# Patient Record
Sex: Female | Born: 1940 | Race: Black or African American | Hispanic: No | Smoking: Never smoker
Health system: Southern US, Community
[De-identification: ages and names within clinical notes are randomized; demographics above are authoritative.]

## PROBLEM LIST (undated history)

## (undated) DIAGNOSIS — E119 Type 2 diabetes mellitus without complications: Secondary | ICD-10-CM

## (undated) DIAGNOSIS — I499 Cardiac arrhythmia, unspecified: Secondary | ICD-10-CM

## (undated) DIAGNOSIS — I639 Cerebral infarction, unspecified: Secondary | ICD-10-CM

## (undated) HISTORY — PX: TRACHEOSTOMY: SUR1362

## (undated) HISTORY — PX: PEG TUBE PLACEMENT: SUR1034

## (undated) HISTORY — PX: BELOW KNEE LEG AMPUTATION: SUR23

---

## 2020-03-19 DIAGNOSIS — J189 Pneumonia, unspecified organism: Secondary | ICD-10-CM

## 2020-03-19 DIAGNOSIS — J9621 Acute and chronic respiratory failure with hypoxia: Secondary | ICD-10-CM | POA: Diagnosis not present

## 2020-03-19 DIAGNOSIS — I5022 Chronic systolic (congestive) heart failure: Secondary | ICD-10-CM | POA: Diagnosis not present

## 2020-03-19 DIAGNOSIS — G40509 Epileptic seizures related to external causes, not intractable, without status epilepticus: Secondary | ICD-10-CM | POA: Diagnosis not present

## 2020-03-20 DIAGNOSIS — G40509 Epileptic seizures related to external causes, not intractable, without status epilepticus: Secondary | ICD-10-CM | POA: Diagnosis not present

## 2020-03-20 DIAGNOSIS — J9621 Acute and chronic respiratory failure with hypoxia: Secondary | ICD-10-CM | POA: Diagnosis not present

## 2020-03-20 DIAGNOSIS — I5022 Chronic systolic (congestive) heart failure: Secondary | ICD-10-CM

## 2020-03-20 DIAGNOSIS — J189 Pneumonia, unspecified organism: Secondary | ICD-10-CM | POA: Diagnosis not present

## 2020-03-21 DIAGNOSIS — I5022 Chronic systolic (congestive) heart failure: Secondary | ICD-10-CM | POA: Diagnosis not present

## 2020-03-21 DIAGNOSIS — J9621 Acute and chronic respiratory failure with hypoxia: Secondary | ICD-10-CM | POA: Diagnosis not present

## 2020-03-21 DIAGNOSIS — G40509 Epileptic seizures related to external causes, not intractable, without status epilepticus: Secondary | ICD-10-CM

## 2020-03-21 DIAGNOSIS — J189 Pneumonia, unspecified organism: Secondary | ICD-10-CM | POA: Diagnosis not present

## 2020-03-22 DIAGNOSIS — I5022 Chronic systolic (congestive) heart failure: Secondary | ICD-10-CM

## 2020-03-22 DIAGNOSIS — J189 Pneumonia, unspecified organism: Secondary | ICD-10-CM

## 2020-03-22 DIAGNOSIS — G40509 Epileptic seizures related to external causes, not intractable, without status epilepticus: Secondary | ICD-10-CM

## 2020-03-22 DIAGNOSIS — J9621 Acute and chronic respiratory failure with hypoxia: Secondary | ICD-10-CM | POA: Diagnosis not present

## 2020-03-23 DIAGNOSIS — G40509 Epileptic seizures related to external causes, not intractable, without status epilepticus: Secondary | ICD-10-CM | POA: Diagnosis not present

## 2020-03-23 DIAGNOSIS — J9621 Acute and chronic respiratory failure with hypoxia: Secondary | ICD-10-CM | POA: Diagnosis not present

## 2020-03-23 DIAGNOSIS — J189 Pneumonia, unspecified organism: Secondary | ICD-10-CM | POA: Diagnosis not present

## 2020-03-23 DIAGNOSIS — I5022 Chronic systolic (congestive) heart failure: Secondary | ICD-10-CM

## 2020-03-24 DIAGNOSIS — J9621 Acute and chronic respiratory failure with hypoxia: Secondary | ICD-10-CM | POA: Diagnosis not present

## 2020-03-24 DIAGNOSIS — J189 Pneumonia, unspecified organism: Secondary | ICD-10-CM | POA: Diagnosis not present

## 2020-03-24 DIAGNOSIS — I5022 Chronic systolic (congestive) heart failure: Secondary | ICD-10-CM | POA: Diagnosis not present

## 2020-03-24 DIAGNOSIS — G40509 Epileptic seizures related to external causes, not intractable, without status epilepticus: Secondary | ICD-10-CM

## 2020-03-25 DIAGNOSIS — J189 Pneumonia, unspecified organism: Secondary | ICD-10-CM | POA: Diagnosis not present

## 2020-03-25 DIAGNOSIS — I5022 Chronic systolic (congestive) heart failure: Secondary | ICD-10-CM | POA: Diagnosis not present

## 2020-03-25 DIAGNOSIS — G40509 Epileptic seizures related to external causes, not intractable, without status epilepticus: Secondary | ICD-10-CM | POA: Diagnosis not present

## 2020-03-25 DIAGNOSIS — J9621 Acute and chronic respiratory failure with hypoxia: Secondary | ICD-10-CM | POA: Diagnosis not present

## 2020-03-31 DIAGNOSIS — J9621 Acute and chronic respiratory failure with hypoxia: Secondary | ICD-10-CM | POA: Diagnosis not present

## 2020-03-31 DIAGNOSIS — G40509 Epileptic seizures related to external causes, not intractable, without status epilepticus: Secondary | ICD-10-CM | POA: Diagnosis not present

## 2020-03-31 DIAGNOSIS — J189 Pneumonia, unspecified organism: Secondary | ICD-10-CM

## 2020-03-31 DIAGNOSIS — I5022 Chronic systolic (congestive) heart failure: Secondary | ICD-10-CM

## 2020-04-01 DIAGNOSIS — J9621 Acute and chronic respiratory failure with hypoxia: Secondary | ICD-10-CM

## 2020-04-01 DIAGNOSIS — I5022 Chronic systolic (congestive) heart failure: Secondary | ICD-10-CM

## 2020-04-01 DIAGNOSIS — G40509 Epileptic seizures related to external causes, not intractable, without status epilepticus: Secondary | ICD-10-CM | POA: Diagnosis not present

## 2020-04-01 DIAGNOSIS — J189 Pneumonia, unspecified organism: Secondary | ICD-10-CM

## 2020-04-02 DIAGNOSIS — G40509 Epileptic seizures related to external causes, not intractable, without status epilepticus: Secondary | ICD-10-CM | POA: Diagnosis not present

## 2020-04-02 DIAGNOSIS — I5022 Chronic systolic (congestive) heart failure: Secondary | ICD-10-CM | POA: Diagnosis not present

## 2020-04-02 DIAGNOSIS — J189 Pneumonia, unspecified organism: Secondary | ICD-10-CM | POA: Diagnosis not present

## 2020-04-02 DIAGNOSIS — J9621 Acute and chronic respiratory failure with hypoxia: Secondary | ICD-10-CM | POA: Diagnosis not present

## 2020-04-03 DIAGNOSIS — J189 Pneumonia, unspecified organism: Secondary | ICD-10-CM

## 2020-04-03 DIAGNOSIS — J9621 Acute and chronic respiratory failure with hypoxia: Secondary | ICD-10-CM

## 2020-04-03 DIAGNOSIS — G40509 Epileptic seizures related to external causes, not intractable, without status epilepticus: Secondary | ICD-10-CM

## 2020-04-03 DIAGNOSIS — I5022 Chronic systolic (congestive) heart failure: Secondary | ICD-10-CM

## 2020-04-04 DIAGNOSIS — I5022 Chronic systolic (congestive) heart failure: Secondary | ICD-10-CM

## 2020-04-04 DIAGNOSIS — J9621 Acute and chronic respiratory failure with hypoxia: Secondary | ICD-10-CM

## 2020-04-04 DIAGNOSIS — J189 Pneumonia, unspecified organism: Secondary | ICD-10-CM

## 2020-04-04 DIAGNOSIS — G40509 Epileptic seizures related to external causes, not intractable, without status epilepticus: Secondary | ICD-10-CM

## 2020-04-15 ENCOUNTER — Emergency Department (HOSPITAL_COMMUNITY): Payer: Medicare Other

## 2020-04-15 ENCOUNTER — Other Ambulatory Visit: Payer: Self-pay

## 2020-04-15 ENCOUNTER — Encounter (HOSPITAL_COMMUNITY): Payer: Self-pay | Admitting: Family Medicine

## 2020-04-15 ENCOUNTER — Inpatient Hospital Stay (HOSPITAL_COMMUNITY)
Admission: EM | Admit: 2020-04-15 | Discharge: 2020-05-14 | DRG: 308 | Disposition: A | Discharge status: Other Acute Inpatient Hospital | Payer: Medicare Other | Attending: Internal Medicine | Admitting: Internal Medicine

## 2020-04-15 DIAGNOSIS — Y95 Nosocomial condition: Secondary | ICD-10-CM | POA: Diagnosis present

## 2020-04-15 DIAGNOSIS — N179 Acute kidney failure, unspecified: Secondary | ICD-10-CM

## 2020-04-15 DIAGNOSIS — Z6831 Body mass index (BMI) 31.0-31.9, adult: Secondary | ICD-10-CM

## 2020-04-15 DIAGNOSIS — D6489 Other specified anemias: Secondary | ICD-10-CM | POA: Diagnosis present

## 2020-04-15 DIAGNOSIS — J810 Acute pulmonary edema: Secondary | ICD-10-CM | POA: Diagnosis not present

## 2020-04-15 DIAGNOSIS — Z8701 Personal history of pneumonia (recurrent): Secondary | ICD-10-CM | POA: Diagnosis not present

## 2020-04-15 DIAGNOSIS — Z1621 Resistance to vancomycin: Secondary | ICD-10-CM | POA: Diagnosis present

## 2020-04-15 DIAGNOSIS — K560 Paralytic ileus: Secondary | ICD-10-CM | POA: Diagnosis not present

## 2020-04-15 DIAGNOSIS — I9589 Other hypotension: Secondary | ICD-10-CM

## 2020-04-15 DIAGNOSIS — N186 End stage renal disease: Secondary | ICD-10-CM | POA: Diagnosis present

## 2020-04-15 DIAGNOSIS — N390 Urinary tract infection, site not specified: Secondary | ICD-10-CM | POA: Diagnosis present

## 2020-04-15 DIAGNOSIS — R54 Age-related physical debility: Secondary | ICD-10-CM | POA: Diagnosis present

## 2020-04-15 DIAGNOSIS — R403 Persistent vegetative state: Secondary | ICD-10-CM | POA: Diagnosis present

## 2020-04-15 DIAGNOSIS — L89152 Pressure ulcer of sacral region, stage 2: Secondary | ICD-10-CM | POA: Diagnosis present

## 2020-04-15 DIAGNOSIS — E1165 Type 2 diabetes mellitus with hyperglycemia: Secondary | ICD-10-CM | POA: Diagnosis present

## 2020-04-15 DIAGNOSIS — J690 Pneumonitis due to inhalation of food and vomit: Secondary | ICD-10-CM | POA: Diagnosis not present

## 2020-04-15 DIAGNOSIS — K921 Melena: Secondary | ICD-10-CM | POA: Diagnosis not present

## 2020-04-15 DIAGNOSIS — E1169 Type 2 diabetes mellitus with other specified complication: Secondary | ICD-10-CM | POA: Diagnosis present

## 2020-04-15 DIAGNOSIS — Z931 Gastrostomy status: Secondary | ICD-10-CM

## 2020-04-15 DIAGNOSIS — J9601 Acute respiratory failure with hypoxia: Secondary | ICD-10-CM | POA: Diagnosis not present

## 2020-04-15 DIAGNOSIS — Z7951 Long term (current) use of inhaled steroids: Secondary | ICD-10-CM

## 2020-04-15 DIAGNOSIS — Z8616 Personal history of COVID-19: Secondary | ICD-10-CM

## 2020-04-15 DIAGNOSIS — Z7189 Other specified counseling: Secondary | ICD-10-CM

## 2020-04-15 DIAGNOSIS — I132 Hypertensive heart and chronic kidney disease with heart failure and with stage 5 chronic kidney disease, or end stage renal disease: Secondary | ICD-10-CM | POA: Diagnosis not present

## 2020-04-15 DIAGNOSIS — D62 Acute posthemorrhagic anemia: Secondary | ICD-10-CM | POA: Diagnosis not present

## 2020-04-15 DIAGNOSIS — R0603 Acute respiratory distress: Secondary | ICD-10-CM | POA: Diagnosis not present

## 2020-04-15 DIAGNOSIS — J189 Pneumonia, unspecified organism: Secondary | ICD-10-CM | POA: Diagnosis not present

## 2020-04-15 DIAGNOSIS — Z881 Allergy status to other antibiotic agents status: Secondary | ICD-10-CM

## 2020-04-15 DIAGNOSIS — I4819 Other persistent atrial fibrillation: Principal | ICD-10-CM | POA: Diagnosis present

## 2020-04-15 DIAGNOSIS — K920 Hematemesis: Secondary | ICD-10-CM | POA: Diagnosis not present

## 2020-04-15 DIAGNOSIS — D500 Iron deficiency anemia secondary to blood loss (chronic): Secondary | ICD-10-CM | POA: Diagnosis not present

## 2020-04-15 DIAGNOSIS — B952 Enterococcus as the cause of diseases classified elsewhere: Secondary | ICD-10-CM | POA: Diagnosis present

## 2020-04-15 DIAGNOSIS — I152 Hypertension secondary to endocrine disorders: Secondary | ICD-10-CM

## 2020-04-15 DIAGNOSIS — E874 Mixed disorder of acid-base balance: Secondary | ICD-10-CM | POA: Diagnosis not present

## 2020-04-15 DIAGNOSIS — T17908S Unspecified foreign body in respiratory tract, part unspecified causing other injury, sequela: Secondary | ICD-10-CM

## 2020-04-15 DIAGNOSIS — E861 Hypovolemia: Secondary | ICD-10-CM

## 2020-04-15 DIAGNOSIS — R000 Tachycardia, unspecified: Secondary | ICD-10-CM | POA: Diagnosis present

## 2020-04-15 DIAGNOSIS — Z794 Long term (current) use of insulin: Secondary | ICD-10-CM

## 2020-04-15 DIAGNOSIS — Z79899 Other long term (current) drug therapy: Secondary | ICD-10-CM

## 2020-04-15 DIAGNOSIS — Z20822 Contact with and (suspected) exposure to covid-19: Secondary | ICD-10-CM | POA: Diagnosis not present

## 2020-04-15 DIAGNOSIS — Z978 Presence of other specified devices: Secondary | ICD-10-CM

## 2020-04-15 DIAGNOSIS — Z89511 Acquired absence of right leg below knee: Secondary | ICD-10-CM

## 2020-04-15 DIAGNOSIS — Z452 Encounter for adjustment and management of vascular access device: Secondary | ICD-10-CM

## 2020-04-15 DIAGNOSIS — Z4659 Encounter for fitting and adjustment of other gastrointestinal appliance and device: Secondary | ICD-10-CM

## 2020-04-15 DIAGNOSIS — Z8673 Personal history of transient ischemic attack (TIA), and cerebral infarction without residual deficits: Secondary | ICD-10-CM

## 2020-04-15 DIAGNOSIS — Z515 Encounter for palliative care: Secondary | ICD-10-CM

## 2020-04-15 DIAGNOSIS — N170 Acute kidney failure with tubular necrosis: Secondary | ICD-10-CM | POA: Diagnosis not present

## 2020-04-15 DIAGNOSIS — I959 Hypotension, unspecified: Secondary | ICD-10-CM | POA: Diagnosis present

## 2020-04-15 DIAGNOSIS — I4892 Unspecified atrial flutter: Secondary | ICD-10-CM | POA: Diagnosis not present

## 2020-04-15 DIAGNOSIS — I69351 Hemiplegia and hemiparesis following cerebral infarction affecting right dominant side: Secondary | ICD-10-CM

## 2020-04-15 DIAGNOSIS — E1122 Type 2 diabetes mellitus with diabetic chronic kidney disease: Secondary | ICD-10-CM | POA: Diagnosis present

## 2020-04-15 DIAGNOSIS — Z992 Dependence on renal dialysis: Secondary | ICD-10-CM

## 2020-04-15 DIAGNOSIS — D638 Anemia in other chronic diseases classified elsewhere: Secondary | ICD-10-CM | POA: Diagnosis present

## 2020-04-15 DIAGNOSIS — Z833 Family history of diabetes mellitus: Secondary | ICD-10-CM

## 2020-04-15 DIAGNOSIS — Z930 Tracheostomy status: Secondary | ICD-10-CM | POA: Diagnosis not present

## 2020-04-15 DIAGNOSIS — I5033 Acute on chronic diastolic (congestive) heart failure: Secondary | ICD-10-CM | POA: Diagnosis present

## 2020-04-15 DIAGNOSIS — I69391 Dysphagia following cerebral infarction: Secondary | ICD-10-CM

## 2020-04-15 DIAGNOSIS — E11649 Type 2 diabetes mellitus with hypoglycemia without coma: Secondary | ICD-10-CM | POA: Diagnosis not present

## 2020-04-15 DIAGNOSIS — L8990 Pressure ulcer of unspecified site, unspecified stage: Secondary | ICD-10-CM | POA: Insufficient documentation

## 2020-04-15 DIAGNOSIS — J9690 Respiratory failure, unspecified, unspecified whether with hypoxia or hypercapnia: Secondary | ICD-10-CM

## 2020-04-15 DIAGNOSIS — J989 Respiratory disorder, unspecified: Secondary | ICD-10-CM | POA: Diagnosis not present

## 2020-04-15 DIAGNOSIS — Z9911 Dependence on respirator [ventilator] status: Secondary | ICD-10-CM

## 2020-04-15 DIAGNOSIS — D649 Anemia, unspecified: Secondary | ICD-10-CM | POA: Diagnosis present

## 2020-04-15 DIAGNOSIS — J9621 Acute and chronic respiratory failure with hypoxia: Secondary | ICD-10-CM | POA: Diagnosis present

## 2020-04-15 DIAGNOSIS — J9611 Chronic respiratory failure with hypoxia: Secondary | ICD-10-CM | POA: Diagnosis not present

## 2020-04-15 DIAGNOSIS — E875 Hyperkalemia: Secondary | ICD-10-CM | POA: Diagnosis not present

## 2020-04-15 DIAGNOSIS — N185 Chronic kidney disease, stage 5: Secondary | ICD-10-CM | POA: Diagnosis not present

## 2020-04-15 DIAGNOSIS — Z66 Do not resuscitate: Secondary | ICD-10-CM | POA: Diagnosis not present

## 2020-04-15 DIAGNOSIS — E876 Hypokalemia: Secondary | ICD-10-CM | POA: Diagnosis not present

## 2020-04-15 DIAGNOSIS — K219 Gastro-esophageal reflux disease without esophagitis: Secondary | ICD-10-CM | POA: Diagnosis present

## 2020-04-15 DIAGNOSIS — G40909 Epilepsy, unspecified, not intractable, without status epilepticus: Secondary | ICD-10-CM | POA: Diagnosis not present

## 2020-04-15 DIAGNOSIS — Z2239 Carrier of other specified bacterial diseases: Secondary | ICD-10-CM

## 2020-04-15 DIAGNOSIS — R531 Weakness: Secondary | ICD-10-CM | POA: Diagnosis not present

## 2020-04-15 DIAGNOSIS — E669 Obesity, unspecified: Secondary | ICD-10-CM | POA: Diagnosis present

## 2020-04-15 DIAGNOSIS — K567 Ileus, unspecified: Secondary | ICD-10-CM

## 2020-04-15 DIAGNOSIS — K922 Gastrointestinal hemorrhage, unspecified: Secondary | ICD-10-CM | POA: Diagnosis not present

## 2020-04-15 DIAGNOSIS — Z7982 Long term (current) use of aspirin: Secondary | ICD-10-CM

## 2020-04-15 DIAGNOSIS — R0600 Dyspnea, unspecified: Secondary | ICD-10-CM

## 2020-04-15 DIAGNOSIS — I4891 Unspecified atrial fibrillation: Secondary | ICD-10-CM

## 2020-04-15 DIAGNOSIS — E46 Unspecified protein-calorie malnutrition: Secondary | ICD-10-CM | POA: Diagnosis present

## 2020-04-15 DIAGNOSIS — E1159 Type 2 diabetes mellitus with other circulatory complications: Secondary | ICD-10-CM

## 2020-04-15 DIAGNOSIS — I480 Paroxysmal atrial fibrillation: Secondary | ICD-10-CM | POA: Diagnosis not present

## 2020-04-15 DIAGNOSIS — T17908A Unspecified foreign body in respiratory tract, part unspecified causing other injury, initial encounter: Secondary | ICD-10-CM | POA: Diagnosis present

## 2020-04-15 DIAGNOSIS — Z8249 Family history of ischemic heart disease and other diseases of the circulatory system: Secondary | ICD-10-CM

## 2020-04-15 DIAGNOSIS — J9612 Chronic respiratory failure with hypercapnia: Secondary | ICD-10-CM | POA: Diagnosis not present

## 2020-04-15 DIAGNOSIS — J9610 Chronic respiratory failure, unspecified whether with hypoxia or hypercapnia: Secondary | ICD-10-CM

## 2020-04-15 HISTORY — DX: Personal history of transient ischemic attack (TIA), and cerebral infarction without residual deficits: Z86.73

## 2020-04-15 HISTORY — DX: Cardiac arrhythmia, unspecified: I49.9

## 2020-04-15 HISTORY — DX: Type 2 diabetes mellitus without complications: E11.9

## 2020-04-15 HISTORY — DX: Cerebral infarction, unspecified: I63.9

## 2020-04-15 HISTORY — DX: Epilepsy, unspecified, not intractable, without status epilepticus: G40.909

## 2020-04-15 LAB — COMPREHENSIVE METABOLIC PANEL
ALT: 25 U/L (ref 0–44)
AST: 58 U/L — ABNORMAL HIGH (ref 15–41)
Albumin: 2.3 g/dL — ABNORMAL LOW (ref 3.5–5.0)
Alkaline Phosphatase: 75 U/L (ref 38–126)
Anion gap: 11 (ref 5–15)
BUN: 23 mg/dL (ref 8–23)
CO2: 27 mmol/L (ref 22–32)
Calcium: 8.8 mg/dL — ABNORMAL LOW (ref 8.9–10.3)
Chloride: 97 mmol/L — ABNORMAL LOW (ref 98–111)
Creatinine, Ser: 0.96 mg/dL (ref 0.44–1.00)
GFR, Estimated: >60 mL/min (ref >60)
Glucose, Bld: 141 mg/dL — ABNORMAL HIGH (ref 70–99)
Potassium: 4.5 mmol/L (ref 3.5–5.1)
Sodium: 135 mmol/L (ref 135–145)
Total Bilirubin: 1.1 mg/dL (ref 0.3–1.2)
Total Protein: 8 g/dL (ref 6.5–8.1)

## 2020-04-15 LAB — CBC WITH DIFFERENTIAL/PLATELET
Abs Immature Granulocytes: 0.02 10*3/uL (ref 0.00–0.07)
Basophils Absolute: 0 10*3/uL (ref 0.0–0.1)
Basophils Relative: 0 %
Eosinophils Absolute: 0.5 10*3/uL (ref 0.0–0.5)
Eosinophils Relative: 7 %
HCT: 27.2 % — ABNORMAL LOW (ref 36.0–46.0)
Hemoglobin: 8.1 g/dL — ABNORMAL LOW (ref 12.0–15.0)
Immature Granulocytes: 0 %
Lymphocytes Relative: 16 %
Lymphs Abs: 1.1 10*3/uL (ref 0.7–4.0)
MCH: 28.4 pg (ref 26.0–34.0)
MCHC: 29.8 g/dL — ABNORMAL LOW (ref 30.0–36.0)
MCV: 95.4 fL (ref 80.0–100.0)
Monocytes Absolute: 0.4 10*3/uL (ref 0.1–1.0)
Monocytes Relative: 6 %
Neutro Abs: 4.9 10*3/uL (ref 1.7–7.7)
Neutrophils Relative %: 71 %
Platelets: 225 10*3/uL (ref 150–400)
RBC: 2.85 MIL/uL — ABNORMAL LOW (ref 3.87–5.11)
RDW: 15.9 % — ABNORMAL HIGH (ref 11.5–15.5)
WBC: 6.9 10*3/uL (ref 4.0–10.5)
nRBC: 0 % (ref 0.0–0.2)

## 2020-04-15 LAB — URINALYSIS, ROUTINE W REFLEX MICROSCOPIC
Bilirubin Urine: NEGATIVE
Glucose, UA: NEGATIVE mg/dL
Ketones, ur: NEGATIVE mg/dL
Nitrite: NEGATIVE
Protein, ur: NEGATIVE mg/dL
Specific Gravity, Urine: 1.01 (ref 1.005–1.030)
pH: 5 (ref 5.0–8.0)

## 2020-04-15 LAB — APTT: aPTT: 34 seconds (ref 24–36)

## 2020-04-15 LAB — RESP PANEL BY RT-PCR (FLU A&B, COVID) ARPGX2
Influenza A by PCR: NEGATIVE
Influenza B by PCR: NEGATIVE
SARS Coronavirus 2 by RT PCR: NEGATIVE

## 2020-04-15 LAB — PROTIME-INR
INR: 1.2 (ref 0.8–1.2)
Prothrombin Time: 14.5 seconds (ref 11.4–15.2)

## 2020-04-15 LAB — LACTIC ACID, PLASMA: Lactic Acid, Venous: 0.9 mmol/L (ref 0.5–1.9)

## 2020-04-15 MED ORDER — DILTIAZEM HCL 30 MG PO TABS
30.0000 mg | ORAL_TABLET | Freq: Once | ORAL | Status: AC
  Administered 2020-04-15: 30 mg via ORAL
  Filled 2020-04-15: qty 1

## 2020-04-15 MED ORDER — PANTOPRAZOLE SODIUM 40 MG IV SOLR
40.0000 mg | Freq: Every day | INTRAVENOUS | Status: DC
  Administered 2020-04-16 – 2020-04-20 (×5): 40 mg via INTRAVENOUS
  Filled 2020-04-15 (×5): qty 40

## 2020-04-15 MED ORDER — DILTIAZEM LOAD VIA INFUSION
15.0000 mg | Freq: Once | INTRAVENOUS | Status: AC
  Administered 2020-04-15: 15 mg via INTRAVENOUS
  Filled 2020-04-15: qty 15

## 2020-04-15 MED ORDER — DILTIAZEM HCL-DEXTROSE 125-5 MG/125ML-% IV SOLN (PREMIX)
5.0000 mg/h | INTRAVENOUS | Status: DC
  Administered 2020-04-15: 5 mg/h via INTRAVENOUS
  Administered 2020-04-16 (×2): 15 mg/h via INTRAVENOUS
  Filled 2020-04-15 (×3): qty 125

## 2020-04-15 MED ORDER — BUDESONIDE 0.25 MG/2ML IN SUSP
0.2500 mg | Freq: Two times a day (BID) | RESPIRATORY_TRACT | Status: DC
  Administered 2020-04-16 – 2020-04-20 (×7): 0.25 mg via RESPIRATORY_TRACT
  Filled 2020-04-15 (×7): qty 2

## 2020-04-15 MED ORDER — PIPERACILLIN-TAZOBACTAM 3.375 G IVPB
3.3750 g | Freq: Once | INTRAVENOUS | Status: AC
  Administered 2020-04-15: 3.375 g via INTRAVENOUS
  Filled 2020-04-15: qty 50

## 2020-04-15 MED ORDER — SODIUM CHLORIDE 0.9 % IV SOLN
500.0000 mg | INTRAVENOUS | Status: AC
  Administered 2020-04-16 – 2020-04-20 (×5): 500 mg via INTRAVENOUS
  Filled 2020-04-15 (×5): qty 500

## 2020-04-15 MED ORDER — DILTIAZEM HCL 25 MG/5ML IV SOLN
15.0000 mg | Freq: Once | INTRAVENOUS | Status: AC
  Administered 2020-04-15: 15 mg via INTRAVENOUS

## 2020-04-15 MED ORDER — MORPHINE SULFATE (PF) 2 MG/ML IV SOLN
1.0000 mg | INTRAVENOUS | Status: DC | PRN
  Administered 2020-04-20: 1 mg via INTRAVENOUS
  Administered 2020-04-20: 2 mg via INTRAVENOUS
  Filled 2020-04-15 (×2): qty 1

## 2020-04-15 MED ORDER — INSULIN ASPART 100 UNIT/ML ~~LOC~~ SOLN
0.0000 [IU] | SUBCUTANEOUS | Status: DC
  Administered 2020-04-17 (×2): 1 [IU] via SUBCUTANEOUS
  Administered 2020-04-18: 2 [IU] via SUBCUTANEOUS
  Administered 2020-04-18 (×2): 5 [IU] via SUBCUTANEOUS
  Administered 2020-04-18: 3 [IU] via SUBCUTANEOUS
  Administered 2020-04-18: 4 [IU] via SUBCUTANEOUS
  Administered 2020-04-18: 2 [IU] via SUBCUTANEOUS
  Administered 2020-04-19: 4 [IU] via SUBCUTANEOUS
  Administered 2020-04-19: 6 [IU] via SUBCUTANEOUS
  Administered 2020-04-19 (×2): 5 [IU] via SUBCUTANEOUS
  Administered 2020-04-19: 4 [IU] via SUBCUTANEOUS
  Administered 2020-04-20: 5 [IU] via SUBCUTANEOUS
  Administered 2020-04-20: 6 [IU] via SUBCUTANEOUS
  Administered 2020-04-20: 5 [IU] via SUBCUTANEOUS

## 2020-04-15 MED ORDER — ACETAMINOPHEN 325 MG PO TABS
650.0000 mg | ORAL_TABLET | ORAL | Status: DC | PRN
  Administered 2020-04-21 – 2020-04-23 (×3): 650 mg
  Filled 2020-04-15 (×3): qty 2

## 2020-04-15 MED ORDER — ONDANSETRON HCL 4 MG/2ML IJ SOLN: 4.0000 mg | Freq: Four times a day (QID) | INTRAMUSCULAR | Status: DC | PRN

## 2020-04-15 MED ORDER — IPRATROPIUM-ALBUTEROL 0.5-2.5 (3) MG/3ML IN SOLN
3.0000 mL | Freq: Four times a day (QID) | RESPIRATORY_TRACT | Status: DC
  Administered 2020-04-16 (×4): 3 mL via RESPIRATORY_TRACT
  Filled 2020-04-15 (×4): qty 3

## 2020-04-15 MED ORDER — VANCOMYCIN HCL 1500 MG/300ML IV SOLN
1500.0000 mg | Freq: Once | INTRAVENOUS | Status: AC
  Administered 2020-04-15: 1500 mg via INTRAVENOUS
  Filled 2020-04-15: qty 300

## 2020-04-15 MED ORDER — IPRATROPIUM-ALBUTEROL 0.5-2.5 (3) MG/3ML IN SOLN: 3.0000 mL | Freq: Four times a day (QID) | RESPIRATORY_TRACT | Status: DC | PRN

## 2020-04-15 MED ORDER — LEVETIRACETAM 100 MG/ML PO SOLN
750.0000 mg | Freq: Two times a day (BID) | ORAL | Status: DC
  Administered 2020-04-16 – 2020-04-22 (×14): 750 mg
  Filled 2020-04-15 (×15): qty 10

## 2020-04-15 MED ORDER — SODIUM CHLORIDE 0.9 % IV BOLUS
1000.0000 mL | Freq: Once | INTRAVENOUS | Status: AC
  Administered 2020-04-15: 1000 mL via INTRAVENOUS

## 2020-04-15 MED ORDER — IOHEXOL 350 MG/ML SOLN
75.0000 mL | Freq: Once | INTRAVENOUS | Status: AC | PRN
  Administered 2020-04-15: 75 mL via INTRAVENOUS

## 2020-04-15 MED ORDER — SODIUM CHLORIDE 3 % IN NEBU
4.0000 mL | INHALATION_SOLUTION | RESPIRATORY_TRACT | Status: AC | PRN
  Administered 2020-04-18: 4 mL via RESPIRATORY_TRACT
  Filled 2020-04-15 (×2): qty 4

## 2020-04-15 MED ORDER — METOPROLOL TARTRATE 5 MG/5ML IV SOLN
5.0000 mg | Freq: Once | INTRAVENOUS | Status: AC
  Administered 2020-04-15: 5 mg via INTRAVENOUS
  Filled 2020-04-15: qty 5

## 2020-04-15 MED ORDER — FUROSEMIDE 20 MG PO TABS
20.0000 mg | ORAL_TABLET | Freq: Every day | ORAL | Status: DC
  Administered 2020-04-16 – 2020-04-17 (×2): 20 mg
  Filled 2020-04-15 (×3): qty 1

## 2020-04-15 MED ORDER — APIXABAN 5 MG PO TABS
5.0000 mg | ORAL_TABLET | Freq: Two times a day (BID) | ORAL | Status: DC
  Administered 2020-04-16 (×2): 5 mg via ORAL
  Filled 2020-04-15 (×3): qty 1

## 2020-04-15 MED ORDER — VANCOMYCIN HCL 1250 MG/250ML IV SOLN
1250.0000 mg | INTRAVENOUS | Status: DC
  Administered 2020-04-16: 1250 mg via INTRAVENOUS
  Filled 2020-04-15 (×2): qty 250

## 2020-04-15 MED ORDER — SODIUM CHLORIDE 0.9 % IV SOLN
2.0000 g | Freq: Two times a day (BID) | INTRAVENOUS | Status: DC
  Administered 2020-04-16 – 2020-04-18 (×5): 2 g via INTRAVENOUS
  Filled 2020-04-15 (×5): qty 2

## 2020-04-15 MED ORDER — MORPHINE SULFATE (PF) 2 MG/ML IV SOLN: 1.0000 mg | INTRAVENOUS | Status: DC | PRN

## 2020-04-15 NOTE — H&P (Signed)
History and Physical    Zillah Duttry [id number] DOB: 16-Aug-1940 DOA: 04/15/2020  PCP: Aaron Edelman, MD   Patient coming from: [facility]   Chief Complaint: Tachycardia   HPI: Kalana Stegenga is a 80 y.o. 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  Patient required 20% FiO2 at time of admission to LTAC 1 month ago, was started on antibiotics approximately 1 week ago for pneumonia and is currently on Zosyn.  She reportedly became tachycardic and has been sustaining in the 120s to 150 range for the past 2 days despite IV Lopressor.  Son was unable to provide any further history.  ED Course: Upon arrival to the ED, patient is found to be afebrile, saturating mid 90s on 8 L/min of supplemental oxygen, tachypneic in the mid 30s, tachycardic to the 130s, and with blood pressure 86/45.  EKG features atrial fibrillation with rate 140.  CTA chest is negative for PE but notable for pulmonary edema and small bilateral pleural effusions as well as loculated effusion within the right minor fissure, oblique fissure on the left, and also noted to have patchy bibasilar airspace opacities.  CT of the abdomen is negative for acute findings and demonstrates percutaneous gastrostomy tube with appropriate positioning.  Chemistry panel with glucose 141, albumin 2.3, and AST 58.  CBC with normocytic anemia, hemoglobin 8.1.  Lactic acid reassuringly normal.  Blood and urine cultures were collected in the emergency department and the patient was treated with IV diltiazem injection, started on IV  diltiazem infusion, also given 5 mg IV Lopressor, vancomycin, Zosyn, and 2 L of saline.  Cardiology and PCCM were consulted by the ED physician.  Review of Systems:  Unable to complete ROS secondary the patient's clinical condition.  Past Medical History:  Diagnosis Date  . Diabetes mellitus without complication ([place])   . Dysrhythmia   . History of CVA (cerebrovascular accident) 04/15/2020  . Seizure disorder ([place]) 04/15/2020  . Stroke [facility])     Past Surgical History:  Procedure Laterality Date  . BELOW KNEE LEG AMPUTATION    . PEG TUBE PLACEMENT    . TRACHEOSTOMY      Social History:   reports that she has never smoked. She has never used smokeless tobacco. She reports previous alcohol use. No history on file for drug use.  Not on File  Family History  Problem Relation Age of Onset  . Diabetes type II Maternal Grandmother   . Hypertension Maternal Grandmother   . Macular degeneration Maternal Grandmother   . Diabetes type II Maternal Grandfather   . Hypertension Maternal Grandfather      Prior to Admission medications   Not on File    Physical Exam: Vitals:   04/15/20 2240 04/15/20 2250 04/15/20 2300 04/15/20 2334  BP: 134/78 (!) 128/97 136/83 (!) 152/76  Pulse: (!) 133 (!) 135 (!) 133 (!) 138  Resp: (!) 27 18 (!) 34 (!) 32  Temp:      TempSrc:      SpO2: 94% 97% 93% 94%  Weight:      Height:        Constitutional: NAD, calm  Eyes: PERTLA, lids and conjunctivae normal ENMT: Mucous membranes are moist. Posterior  pharynx clear of any exudate or lesions.   Neck: tracheostomy, no masses  Respiratory: Coarse upper airway sounds, scattered rhonchi, tachypnea, labored respirations. No pallor or cyanosis.  Cardiovascular: Rate ~120 and irregularly irregular. No extremity edema.   Abdomen: No distension, no tenderness, soft. Bowel sounds active.  Musculoskeletal: no clubbing / cyanosis. Status-post right leg amputation. Contractures involving RUE.  Skin: no  significant rashes, lesions, ulcers. Warm, well-perfused. Neurologic: Gaze deviation. Right hemiparesis.   Psychiatric: Alert, makes eye contact, nods/shakes head to questions. Calm and cooperative.    Labs and Imaging on Admission: I have personally reviewed following labs and imaging studies  CBC: Recent Labs  Lab 04/15/20 1253  WBC 6.9  NEUTROABS 4.9  HGB 8.1*  HCT 27.2*  MCV 95.4  PLT 123456   Basic Metabolic Panel: Recent Labs  Lab 04/15/20 1253  NA 135  K 4.5  CL 97*  CO2 27  GLUCOSE 141*  BUN 23  CREATININE 0.96  CALCIUM 8.8*   GFR: Estimated Creatinine Clearance: 52.5 mL/min (by C-G formula based on SCr of 0.96 mg/dL). Liver Function Tests: Recent Labs  Lab 04/15/20 1253  AST 58*  ALT 25  ALKPHOS 75  BILITOT 1.1  PROT 8.0  ALBUMIN 2.3*   No results for input(s): LIPASE, AMYLASE in the last 168 hours. No results for input(s): AMMONIA in the last 168 hours. Coagulation Profile: Recent Labs  Lab 04/15/20 1253  INR 1.2   Cardiac Enzymes: No results for input(s): CKTOTAL, CKMB, CKMBINDEX, TROPONINI in the last 168 hours. BNP (last 3 results) No results for input(s): PROBNP in the last 8760 hours. HbA1C: No results for input(s): HGBA1C in the last 72 hours. CBG: No results for input(s): GLUCAP in the last 168 hours. Lipid Profile: No results for input(s): CHOL, HDL, LDLCALC, TRIG, CHOLHDL, LDLDIRECT in the last 72 hours. Thyroid Function Tests: No results for input(s): TSH, T4TOTAL, FREET4, T3FREE, THYROIDAB in the last 72 hours. Anemia Panel: No results for input(s): VITAMINB12, FOLATE, FERRITIN, TIBC, IRON, RETICCTPCT in the last 72 hours. Urine analysis:    Component Value Date/Time   COLORURINE YELLOW 04/15/2020 1452   APPEARANCEUR HAZY (A) 04/15/2020 1452   LABSPEC 1.010 04/15/2020 1452   PHURINE 5.0 04/15/2020 1452   GLUCOSEU NEGATIVE 04/15/2020 1452   HGBUR MODERATE (A) 04/15/2020 1452   BILIRUBINUR NEGATIVE 04/15/2020 1452   KETONESUR  NEGATIVE 04/15/2020 1452   PROTEINUR NEGATIVE 04/15/2020 1452   NITRITE NEGATIVE 04/15/2020 1452   LEUKOCYTESUR LARGE (A) 04/15/2020 1452   Sepsis Labs: '@LABRCNTIP'$ (procalcitonin:4,lacticidven:4) ) Recent Results (from the past 240 hour(s))  Resp Panel by RT-PCR (Flu A&B, Covid) Nasopharyngeal Swab     Status: None   Collection Time: 04/15/20 12:55 PM   Specimen: Nasopharyngeal Swab; Nasopharyngeal(NP) swabs in vial transport medium  Result Value Ref Range Status   SARS Coronavirus 2 by RT PCR NEGATIVE NEGATIVE Final    Comment: (NOTE) SARS-CoV-2 target nucleic acids are NOT DETECTED.  The SARS-CoV-2 RNA is generally detectable in upper respiratory specimens during the acute phase of infection. The lowest concentration of SARS-CoV-2 viral copies this assay can detect is 138 copies/mL. A negative result does not preclude SARS-Cov-2 infection and should not be used as the sole basis for treatment or other patient management decisions. A negative result may occur with  improper specimen collection/handling, submission of specimen other than nasopharyngeal swab, presence of viral mutation(s) within the areas targeted by this assay, and inadequate number of viral copies(<138 copies/mL). A negative result must be  combined with clinical observations, patient history, and epidemiological information. The expected result is Negative.  Fact Sheet for Patients:  EntrepreneurPulse.com.au  Fact Sheet for Healthcare Providers:  IncredibleEmployment.be  This test is no t yet approved or cleared by the [place] FDA and  has been authorized for detection and/or diagnosis of SARS-CoV-2 by FDA under an Emergency Use Authorization (EUA). This EUA will remain  in effect (meaning this test can be used) for the duration of the COVID-19 declaration under Section 564(b)(1) of the Act, 21 U.S.C.section 360bbb-3(b)(1), unless the authorization is terminated  or  revoked sooner.       Influenza A by PCR NEGATIVE NEGATIVE Final   Influenza B by PCR NEGATIVE NEGATIVE Final    Comment: (NOTE) The Xpert Xpress SARS-CoV-2/FLU/RSV plus assay is intended as an aid in the diagnosis of influenza from Nasopharyngeal swab specimens and should not be used as a sole basis for treatment. Nasal washings and aspirates are unacceptable for Xpert Xpress SARS-CoV-2/FLU/RSV testing.  Fact Sheet for Patients: EntrepreneurPulse.com.au  Fact Sheet for Healthcare Providers: IncredibleEmployment.be  This test is not yet approved or cleared by the [place] FDA and has been authorized for detection and/or diagnosis of SARS-CoV-2 by FDA under an Emergency Use Authorization (EUA). This EUA will remain in effect (meaning this test can be used) for the duration of the COVID-19 declaration under Section 564(b)(1) of the Act, 21 U.S.C. section 360bbb-3(b)(1), unless the authorization is terminated or revoked.  Performed at [facility], [place] [address], [place], [place] [address]      Radiological Exams on Admission: CT ABDOMEN WO CONTRAST  Result Date: 04/15/2020 CLINICAL DATA:  Possible G-tube malpositioning EXAM: CT ABDOMEN WITHOUT CONTRAST TECHNIQUE: Multidetector CT imaging of the abdomen was performed following the standard protocol without IV contrast. COMPARISON:  CT chest same day FINDINGS: Lower chest: Cardiomegaly. Coronary artery calcifications. Small bilateral pleural effusions with patchy bibasilar opacities. Hepatobiliary: Unremarkable unenhanced appearance of the liver. No focal liver lesion identified. Gallbladder is decompressed. No hyperdense gallstone. No biliary dilatation. Pancreas: Unremarkable. No pancreatic ductal dilatation or surrounding inflammatory changes. Spleen: Grossly unremarkable. Adrenals/Urinary Tract: No definite adrenal nodule. Kidneys appear within normal limits. Excreted contrast within  the bilateral renal collecting systems. No hydronephrosis. Stomach/Bowel: Percutaneous gastrostomy tube is appropriately positioned with balloon located in the anterior aspect of the distal gastric body. No evidence of outlet obstruction. Visualized bowel loops within the upper abdomen are within normal limits. No evidence of obstruction or inflammation. Vascular/Lymphatic: Extensive atherosclerosis throughout the aortoiliac axis. No upper abdominal lymphadenopathy. Other: No ascites or pneumoperitoneum. Musculoskeletal: No acute findings. IMPRESSION: 1. Percutaneous gastrostomy tube is appropriately positioned with balloon located intraluminally in the anterior aspect of the distal gastric body. No evidence of outlet obstruction. 2. No acute findings within the abdomen. 3. Small bilateral pleural effusions with patchy bibasilar opacities. Aortic Atherosclerosis (ICD10-I70.0). Electronically Signed   By: Davina Poke D.O.   On: 04/15/2020 16:16   CT Angio Chest PE W and/or Wo Contrast  Result Date: 04/15/2020 CLINICAL DATA:  Tachycardia EXAM: CT ANGIOGRAPHY CHEST WITH CONTRAST TECHNIQUE: Multidetector CT imaging of the chest was performed using the standard protocol during bolus administration of intravenous contrast. Multiplanar CT image reconstructions and MIPs were obtained to evaluate the vascular anatomy. CONTRAST:  6m OMNIPAQUE IOHEXOL 350 MG/ML SOLN COMPARISON:  04/15/2020 FINDINGS: Cardiovascular: Satisfactory opacification of the pulmonary arteries to the segmental level. No evidence of pulmonary embolism. Thoracic aorta is nonaneurysmal. Common origin of the brachiocephalic  and left common carotid arteries. Tortuosity of the descending thoracic aorta. Atherosclerotic calcifications of the aorta and coronary arteries. Heart size is mildly enlarged. No pericardial effusion. Mediastinum/Nodes: Mildly prominent mediastinal lymph nodes including 12 mm precarinal node (series 5, image 37). No enlarged  axillary or hilar lymph nodes identified. Tracheostomy tube is present. Trachea and esophagus within normal limits. No significant findings within the thyroid gland. Lungs/Pleura: Small bilateral pleural effusions. Loculated pleural fluid accumulation within the minor fissure on the right and within the oblique fissure on the left. Patchy bibasilar airspace opacities. Diffuse interlobular septal thickening with ground-glass attenuation throughout both lung fields. No pneumothorax. Upper Abdomen: Although only partially visualized at the edge of the field of view. Patient's percutaneous gastrostomy tube appears malpositioned with balloon appearing extraluminal positioned anterior to the left hepatic lobe (series 5, image 97). Musculoskeletal: No acute osseous abnormality.  No chest wall mass. Review of the MIP images confirms the above findings. IMPRESSION: 1. Negative for acute pulmonary embolism. 2. Findings suggestive of pulmonary edema with small bilateral pleural effusions. Loculated pleural fluid accumulation within the minor fissure on the right and within the oblique fissure on the left. 3. Patchy bibasilar airspace opacities may represent edema, atelectasis, or pneumonia. 4. Patient's percutaneous gastrostomy tube appears malpositioned although is incompletely visualized at the edge of the field of view. CT of the abdomen could be obtained to further evaluate. 5. Mildly prominent mediastinal lymph nodes, likely reactive. 6. Aortic and coronary artery atherosclerosis (ICD10-I70.0). Electronically Signed   By: Davina Poke D.O.   On: 04/15/2020 15:00   DG Chest Port 1 View  Result Date: 04/15/2020 CLINICAL DATA:  Tachycardia and wheezing EXAM: PORTABLE CHEST 1 VIEW COMPARISON:  November 06, 2010. FINDINGS: Tracheostomy catheter tip is 4.3 cm above the carina. No pneumothorax. There is apparent interstitial pulmonary edema with partially loculated small left pleural effusion. Ill-defined opacity noted in  left base. There is cardiomegaly with pulmonary venous hypertension. No adenopathy. There is aortic atherosclerosis. No bone lesions. IMPRESSION: Tracheostomy as described without evident pneumothorax. Cardiomegaly with a degree of pulmonary vascular congestion. Evidence of interstitial pulmonary edema with small loculated pleural effusion. The appearance is suspicious for a degree of underlying congestive heart failure. Focal airspace opacity in the left base is concerning for focus of pneumonia, likely superimposed on a degree of interstitial pulmonary edema. Aortic Atherosclerosis (ICD10-I70.0). Electronically Signed   By: Lowella Grip III M.D.   On: 04/15/2020 13:55    EKG: Independently reviewed. Atrial fibrillation with RVR, rate 140.   Assessment/Plan   1. Atrial fibrillation with RVR  - Patient with hx of PAF no longer anticoagulated for unclear reason presents with persistent tachycardia despite IV Lopressor at [facility] and is found to be in atrial fibrialation with RVR - CHADS-VASc 7 (age x2, CVA x2, DM, HTN, gender)  - Rate remained rapid after IV diltiazem, IV Lopressor, and 2 liters of NS and she was started on diltiazem infusion  - Appreciate cardiology consultant, recommending starting Eliquis 5 mg BID, continuing diltiazem infusion and transitioning to oral as able, and obtaining TTE once rate is controlled    2. Acute on chronic hypoxic respiratory failure; ?PNA; acute on chronic HFpEF  - History of aspiration, trach dependence, requiring 28% FiO2 on admission to LTAC, now requiring 35%  - CTA negative for PE but concerning for pulm edema, loculated effusion, and possible infection in lower lungs  - Appreciate PCCM consultants  - Continue trach care, nebs, and supplemental O2  -  Check sputum culture, strep pneumo and legionella antigens, trend procalcitonin, check MRSA pcr, continue vancomycin for now and start cefepime and azithromycin  - She required 2 liters NS in ED for  hypotension, may benefit from diuresis if BP remains stable    3. History of CVA   - Residual right hemiparesis and dysphagia, tracheostomy and PEG dependent  -  Starting Eliquis in setting of a fib, hold ASA, consult dietary   4. Seizure disorder  - Continue Keppra    5. Hypertension  - Required 2 liters NS in ED for hypotension, hold hydralazine and Norvasc for now   6. Insulin-dependent DM  - Check CBGs and continue insulin   7. Anemia  - Hgb is 8.1 on admission, was 8.4 on 04/10/20 - Monitor closely while initiating Eliquis     DVT prophylaxis: Eliquis  Code Status: Full, confirmed with family  Level of Care: Level of care: Progressive Family Communication: Son updated in ED  Disposition Plan:  Patient is from: LTAC  Anticipated d/c is to: [facility] Anticipated d/c date is: 04/19/20 Patient currently: In rapid a fib, labored breathing  Consults called: Cardiology, PCCM  Admission status: Inpatient     Vianne Bulls, MD [facility]  04/15/2020, 11:51 PM

## 2020-04-15 NOTE — Consult Note (Signed)
Cardiology Consultation:   Patient ID: Rebecca Harrell MRN: [id number]; DOB: 05/01/1940  Admit date: 04/15/2020 Date of Consult: 04/15/2020  Primary Care Provider: Aaron Edelman, MD [facility] Cardiologist: No primary care provider on file.  [facility] Electrophysiologist:  None   Patient Profile:   28F with pAF, T2DM, HTN, seizures, HFpEF, CVA with residual R sided deficits, GERD and trach who presented from [place] 2/2 ongoing AF/RVR since Friday.   History of Present Illness:   Per care team she has had HR 125-150 over the past 48h and was given metoprolol 5 mg IV several times with no improvement in HR. Last dose prior to evaluation here in the ED was 0830 yesterday. She has been afebrile and is being tx for PNA with zosyn through PICC line (PNA dx on 04/08/20). On admission in AF/RVR (HR 140s) but normotensive. Although not documented she apparently has hx of prior AF. She is not on [facility] however has had previous CVA and has C2V (7). History is very limited as we have no documentation of prior hospitalizations.   Discussed history with daughter. She said she was in [place] for at least 10-15 years following stroke. She was very functional before stroke. Last month she was transitioned to [facility] following trach placement for recurrent aspiration PNA. She was at [place] prior to discharge to [place], she had been there for around 1 week. Daughter says said she was on warfarin in the past but was taken off of it for unknown reason. She said at some point her feeding tube "had blood in it" and they took her off of warfarin maybe at that time. Discussed the risk/benefit of blood thinners and stroke with daughter who would be fine re trialing [place] with her. Confirmed she is FULL code with her daughter.   Home Medications:  Prior to Admission medications   Not on File   Inpatient Medications: Scheduled Meds:  Continuous Infusions: . diltiazem (CARDIZEM)  infusion 5 mg/hr (04/15/20 1629)  . piperacillin-tazobactam (ZOSYN)  IV    . [START ON 04/16/2020] vancomycin     PRN Meds:  Allergies:   Not on File  Social History:   Social History   Socioeconomic History  . Marital status: Divorced    Spouse name: Not on file  . Number of children: Not on file  . Years of education: Not on file  . Highest education level: Not on file  Occupational History  . Not on file  Tobacco Use  . Smoking status: Not on file  . Smokeless tobacco: Not on file  Substance and Sexual Activity  . Alcohol use: Not on file  . Drug use: Not on file  . Sexual activity: Not on file  Other Topics Concern  . Not on file  Social History Narrative  . Not on file   Social Determinants of Health   Financial Resource Strain: Not on file  Food Insecurity: Not on file  Transportation Needs: Not on file  Physical Activity: Not on file  Stress: Not on file  Social Connections: Not on file  Intimate Partner Violence: Not on file    Family History:   *No family history on file.   ROS:  Review of Systems: [y] = yes, '[ ]'$  = no       General: Weight gain '[ ]'$ ; Weight loss '[ ]'$ ; Anorexia '[ ]'$ ; Fatigue '[ ]'$ ; Fever '[ ]'$ ; Chills '[ ]'$ ; Weakness '[ ]'$     Cardiac: Chest pain/pressure '[ ]'$ ;  Resting SOB '[ ]'$ ; Exertional SOB '[ ]'$ ; Orthopnea '[ ]'$ ; Pedal Edema '[ ]'$ ; Palpitations [y]; Syncope '[ ]'$ ; Presyncope '[ ]'$ ; Paroxysmal nocturnal dyspnea '[ ]'$     Pulmonary: Cough '[ ]'$ ; Wheezing '[ ]'$ ; Hemoptysis '[ ]'$ ; Sputum '[ ]'$ ; Snoring '[ ]'$     GI: Vomiting '[ ]'$ ; Dysphagia '[ ]'$ ; Melena '[ ]'$ ; Hematochezia '[ ]'$ ; Heartburn '[ ]'$ ; Abdominal pain '[ ]'$ ; Constipation '[ ]'$ ; Diarrhea '[ ]'$ ; BRBPR '[ ]'$     GU: Hematuria '[ ]'$ ; Dysuria '[ ]'$ ; Nocturia '[ ]'$   Vascular: Pain in legs with walking '[ ]'$ ; Pain in feet with lying flat '[ ]'$ ; Non-healing sores '[ ]'$ ; Stroke '[ ]'$ ; TIA '[ ]'$ ; Slurred speech '[ ]'$ ;    Neuro: Headaches '[ ]'$ ; Vertigo '[ ]'$ ; Seizures '[ ]'$ ; Paresthesias '[ ]'$ ;Blurred vision '[ ]'$ ; Diplopia '[ ]'$ ; Vision changes '[ ]'$     Ortho/Skin:  Arthritis '[ ]'$ ; Joint pain '[ ]'$ ; Muscle pain '[ ]'$ ; Joint swelling '[ ]'$ ; Back Pain '[ ]'$ ; Rash '[ ]'$     Psych: Depression '[ ]'$ ; Anxiety '[ ]'$     Heme: Bleeding problems '[ ]'$ ; Clotting disorders '[ ]'$ ; Anemia '[ ]'$     Endocrine: Diabetes '[ ]'$ ; Thyroid dysfunction '[ ]'$    Physical Exam/Data:   Vitals:   04/15/20 1600 04/15/20 1615 04/15/20 1630 04/15/20 1640  BP: 117/79 (!) 132/95 118/82 97/82  Pulse: (!) 130 (!) 129 (!) 130 93  Resp: (!) 29 (!) 27 20 (!) 7  Temp:      TempSrc:      SpO2: 96% 97% 95% 93%  Weight:      Height:        Intake/Output Summary (Last 24 hours) at 04/15/2020 1718 Last data filed at 04/15/2020 1617 Gross per 24 hour  Intake 2256.75 ml  Output --  Net 2256.75 ml   Last 3 Weights 04/15/2020 04/15/2020  Weight (lbs) 170 lb 170 lb  Weight (kg) 77.111 kg 77.111 kg     Body mass index is 26.63 kg/m.  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  Mild concentric left ventricular hypertrophy is observed.  Global left ventricular wall motion and contractility are within normal  limits.  The ejection fraction is estimated to be 65%.  Evidence of mild  mitral annular calcification.  There is mild dilatation of the ascending aorta.  Rhythm AF    Laboratory Data:  High Sensitivity Troponin:  No results for input(s): TROPONINIHS in the last 720 hours.   Chemistry Recent Labs  Lab 04/15/20 1253  NA 135  K 4.5  CL 97*  CO2 27  GLUCOSE 141*  BUN 23  CREATININE 0.96  CALCIUM 8.8*  GFRNONAA >60  ANIONGAP  11    Recent Labs  Lab 04/15/20 1253  PROT 8.0  ALBUMIN 2.3*  AST 58*  ALT 25  ALKPHOS 75  BILITOT 1.1   Hematology Recent Labs  Lab 04/15/20 1253  WBC 6.9  RBC 2.85*  HGB 8.1*  HCT 27.2*  MCV 95.4  MCH 28.4  MCHC 29.8*  RDW 15.9*  PLT 225   BNPNo results for input(s): BNP, PROBNP in the last 168 hours.  DDimer No results for input(s): DDIMER in the last 168 hours.  Radiology/Studies:  CT ABDOMEN WO CONTRAST  Result Date: 04/15/2020 CLINICAL DATA:  Possible G-tube malpositioning EXAM: CT ABDOMEN WITHOUT CONTRAST TECHNIQUE: Multidetector CT imaging of the abdomen was performed following the standard protocol without IV contrast. COMPARISON:  CT chest same day FINDINGS: Lower chest: Cardiomegaly. Coronary artery calcifications. Small bilateral pleural effusions with patchy bibasilar opacities. Hepatobiliary: Unremarkable unenhanced appearance of the liver. No focal liver lesion identified. Gallbladder is decompressed. No hyperdense gallstone. No biliary dilatation. Pancreas: Unremarkable. No pancreatic ductal dilatation or surrounding inflammatory changes. Spleen: Grossly unremarkable. Adrenals/Urinary Tract: No definite adrenal nodule. Kidneys appear within normal limits. Excreted contrast within the bilateral renal collecting systems. No hydronephrosis. Stomach/Bowel: Percutaneous gastrostomy tube is appropriately positioned with balloon located in the anterior aspect of the distal gastric body. No evidence of outlet obstruction. Visualized bowel loops within the upper abdomen are within normal limits. No evidence of  obstruction or inflammation. Vascular/Lymphatic: Extensive atherosclerosis throughout the aortoiliac axis. No upper abdominal lymphadenopathy. Other: No ascites or pneumoperitoneum. Musculoskeletal: No acute findings. IMPRESSION: 1. Percutaneous gastrostomy tube is appropriately positioned with balloon located intraluminally in the anterior aspect of the distal gastric body. No evidence of outlet obstruction. 2. No acute findings within the abdomen. 3. Small bilateral pleural effusions with patchy bibasilar opacities. Aortic Atherosclerosis (ICD10-I70.0). Electronically Signed   By: Davina Poke D.O.   On: 04/15/2020 16:16   CT Angio Chest PE W and/or Wo Contrast  Result Date: 04/15/2020 CLINICAL DATA:  Tachycardia EXAM: CT ANGIOGRAPHY CHEST WITH CONTRAST TECHNIQUE: Multidetector CT imaging of the chest was performed using the standard protocol during bolus administration of intravenous contrast. Multiplanar CT image reconstructions and MIPs were obtained to evaluate the vascular anatomy. CONTRAST:  64m OMNIPAQUE IOHEXOL 350 MG/ML SOLN COMPARISON:  04/15/2020 FINDINGS: Cardiovascular: Satisfactory opacification of the pulmonary arteries to the segmental level. No evidence of pulmonary embolism. Thoracic aorta is nonaneurysmal. Common origin of the brachiocephalic and left common carotid arteries. Tortuosity of the descending thoracic aorta. Atherosclerotic calcifications of the aorta and coronary arteries. Heart size is mildly enlarged. No pericardial effusion. Mediastinum/Nodes: Mildly prominent mediastinal lymph nodes including 12 mm precarinal node (series 5, image 37). No enlarged axillary or hilar lymph nodes identified. Tracheostomy tube is present. Trachea and esophagus within normal limits. No significant findings within the thyroid gland. Lungs/Pleura: Small bilateral pleural effusions. Loculated pleural fluid accumulation within the minor fissure on the right and within the oblique fissure on the  left. Patchy bibasilar airspace opacities. Diffuse interlobular septal thickening with ground-glass attenuation throughout both lung fields. No pneumothorax. Upper Abdomen: Although only partially visualized at the edge of the field of view. Patient's percutaneous gastrostomy tube appears malpositioned with balloon appearing extraluminal positioned anterior to the left hepatic lobe (series 5, image 97). Musculoskeletal: No acute osseous abnormality.  No chest wall mass. Review of the MIP images confirms the above findings. IMPRESSION: 1. Negative for acute pulmonary embolism. 2. Findings suggestive of pulmonary edema with small bilateral pleural effusions. Loculated pleural  fluid accumulation within the minor fissure on the right and within the oblique fissure on the left. 3. Patchy bibasilar airspace opacities may represent edema, atelectasis, or pneumonia. 4. Patient's percutaneous gastrostomy tube appears malpositioned although is incompletely visualized at the edge of the field of view. CT of the abdomen could be obtained to further evaluate. 5. Mildly prominent mediastinal lymph nodes, likely reactive. 6. Aortic and coronary artery atherosclerosis (ICD10-I70.0). Electronically Signed   By: Davina Poke D.O.   On: 04/15/2020 15:00   DG Chest Port 1 View  Result Date: 04/15/2020 CLINICAL DATA:  Tachycardia and wheezing EXAM: PORTABLE CHEST 1 VIEW COMPARISON:  November 06, 2010. FINDINGS: Tracheostomy catheter tip is 4.3 cm above the carina. No pneumothorax. There is apparent interstitial pulmonary edema with partially loculated small left pleural effusion. Ill-defined opacity noted in left base. There is cardiomegaly with pulmonary venous hypertension. No adenopathy. There is aortic atherosclerosis. No bone lesions. IMPRESSION: Tracheostomy as described without evident pneumothorax. Cardiomegaly with a degree of pulmonary vascular congestion. Evidence of interstitial pulmonary edema with small loculated  pleural effusion. The appearance is suspicious for a degree of underlying congestive heart failure. Focal airspace opacity in the left base is concerning for focus of pneumonia, likely superimposed on a degree of interstitial pulmonary edema. Aortic Atherosclerosis (ICD10-I70.0). Electronically Signed   By: Lowella Grip III M.D.   On: 04/15/2020 13:55   Assessment and Plan:   77F with pAF, T2DM, HTN, seizures, HFpEF, CVA with residual R sided deficits, GERD and trach who presented from [place] 2/2 ongoing AF/RVR since Friday.  We are unfortunately limited by lack of history with regards to her atrial fibrillation however on review of the limited data I have she was in AF all the way back to 2010 and based off outside ECG from [facility].  She is currently being rate controlled with diltiazem gtt/PO.  I discussed the risk and benefits of [place] with her daughter who is HCP.  She has a C2V (7) placing her 11.2% risk of annual recurrent stroke.  She apparently was on warfarin up until fairly recently which was discontinued in the setting of possible bleeding in her feeding tube.  The daughter does not think that she required any blood products or that it required any level of ICU care.  At this time I think it is reasonable to start Eliquis 5 mg p.o. twice daily while trying to get better rate control with diltiazem.  We do not have a recent EF and her prior echo was in 2010 which showed preserved EF.  She had minimal response to metoprolol at [facility].  She has no signs of shock on exam and I think it is reasonable to continue with diltiazem IV and up titration of oral diltiazem.  She has been treated over the past week for aspiration pneumonia with Zosyn and still on that currently.  All of her lab work is relatively reassuring.  - continue dilt gtt, once rates controlled will convert to PO dilt equivalent (currently on 7.5 mg/h gtt and 30 mg PO q6h - start eliquis 5 mg PO bid - TTE once rates better  controlled to assess EF   For questions or updates, please contact [place] Please consult [email or web address] for contact info under   Signed, Dion Body, MD  04/15/2020 5:18 PM

## 2020-04-15 NOTE — ED Provider Notes (Signed)
[facility] EMERGENCY DEPARTMENT Provider Note   CSN: [id number] Arrival date & time: 04/15/20  1148     History Chief Complaint  Patient presents with  . Tachycardia    Rebecca Harrell is a 80 y.o. female with a past medical history significant for type 2 diabetes, hypertension, seizures, diastolic congestive heart failure, history of CVA with right-sided deficits, tracheostomy dependant, and GERD who presents to the ED from [facility] due to continued tachycardiac for 1.5 days. Spoke to [place] who cares for patient who notes patient's HR has been running in 125-150 range over the past 1.5 days. The physician prescribed metoprolol '5mg'$  IV push which we has received a few times with no improvement in heart rate, last dose this morning at 8:30. Per [place], an EKG was obtained yesterday which did not show A. Fib. No fever per [place].  Patient is currently being treated for pneumonia through a PICC line with Zosyn 3.375 g every 8 hours due to pneumonia that was diagnosed on 3/6. Her last dose was 8:30AM this morning. Patient has been a resident at [facility] since 3/2. [place] notes no change in patient since arrival except for tachycardia. Patient found to a deviated gaze to the right which [place] notes is chronic.   Level 5 caveat secondary to nonverbal patient  History obtained from [place] and past medical records. No interpreter used during encounter.      No past medical history on file.  There are no problems to display for this patient.    OB History   No obstetric history on file.     No family history on file.     Home Medications Prior to Admission medications   Not on File    Allergies    Patient has no allergy information on record.  Review of Systems   Review of Systems  Unable to perform ROS: Patient nonverbal    Physical Exam Updated Vital Signs BP 122/88   Pulse (!) 127   Temp 99.1 F (37.3 C) (Rectal)   Resp (!)  23   Ht '5\' 7"'$  (1.702 m)   Wt 77.1 kg   SpO2 95%   BMI 26.63 kg/m   Physical Exam Vitals and nursing note reviewed.  Constitutional:      General: She is not in acute distress.    Appearance: She is ill-appearing.  HENT:     Head: Normocephalic.  Eyes:     Pupils: Pupils are equal, round, and reactive to light.  Cardiovascular:     Rate and Rhythm: Normal rate and regular rhythm.     Pulses: Normal pulses.     Heart sounds: Normal heart sounds. No murmur heard. No friction rub. No gallop.   Pulmonary:     Effort: Pulmonary effort is normal.     Breath sounds: Normal breath sounds.     Comments: Trach in place. Bibasilar rales.  Abdominal:     General: Abdomen is flat. Bowel sounds are normal. There is no distension.     Palpations: Abdomen is soft.     Tenderness: There is no abdominal tenderness. There is no guarding or rebound.     Comments: G tube in place with no surrounding erythema or discharge. Abdomen soft, non-distended, and non-tender  Musculoskeletal:     Cervical back: Neck supple.     Comments: Right sided weakness from previous CVA  Skin:    General: Skin is warm and dry.     Capillary Refill:  Capillary refill takes less than 2 seconds.  Neurological:     General: No focal deficit present.     Comments: Deviated gaze to right with right sided weakness from previous CVA  Psychiatric:        Mood and Affect: Mood normal.     ED Results / Procedures / Treatments   Labs (all labs ordered are listed, but only abnormal results are displayed) Labs Reviewed  COMPREHENSIVE METABOLIC PANEL - Abnormal; Notable for the following components:      Result Value   Chloride 97 (*)    Glucose, Bld 141 (*)    Calcium 8.8 (*)    Albumin 2.3 (*)    AST 58 (*)    All other components within normal limits  CBC WITH DIFFERENTIAL/PLATELET - Abnormal; Notable for the following components:   RBC 2.85 (*)    Hemoglobin 8.1 (*)    HCT 27.2 (*)    MCHC 29.8 (*)    RDW 15.9  (*)    All other components within normal limits  URINALYSIS, ROUTINE W REFLEX MICROSCOPIC - Abnormal; Notable for the following components:   APPearance HAZY (*)    Hgb urine dipstick MODERATE (*)    Leukocytes,Ua LARGE (*)    Bacteria, UA FEW (*)    All other components within normal limits  RESP PANEL BY RT-PCR (FLU A&B, COVID) ARPGX2  CULTURE, BLOOD (SINGLE)  URINE CULTURE  LACTIC ACID, PLASMA  PROTIME-INR  APTT    EKG EKG Interpretation  Date/Time:  Sunday April 15 2020 12:00:03 EDT Ventricular Rate:  140 PR Interval:    QRS Duration: 80 QT Interval:  308 QTC Calculation: 470 R Axis:   -32 Text Interpretation: Atrial fibrillation Ventricular premature complex LVH with secondary repolarization abnormality Anterior infarct, old No old tracing to compare afib with rvr Confirmed by Varney Biles [id number]) on 04/15/2020 12:25:32 PM   Radiology CT Angio Chest PE W and/or Wo Contrast  Result Date: 04/15/2020 CLINICAL DATA:  Tachycardia EXAM: CT ANGIOGRAPHY CHEST WITH CONTRAST TECHNIQUE: Multidetector CT imaging of the chest was performed using the standard protocol during bolus administration of intravenous contrast. Multiplanar CT image reconstructions and MIPs were obtained to evaluate the vascular anatomy. CONTRAST:  39m OMNIPAQUE IOHEXOL 350 MG/ML SOLN COMPARISON:  04/15/2020 FINDINGS: Cardiovascular: Satisfactory opacification of the pulmonary arteries to the segmental level. No evidence of pulmonary embolism. Thoracic aorta is nonaneurysmal. Common origin of the brachiocephalic and left common carotid arteries. Tortuosity of the descending thoracic aorta. Atherosclerotic calcifications of the aorta and coronary arteries. Heart size is mildly enlarged. No pericardial effusion. Mediastinum/Nodes: Mildly prominent mediastinal lymph nodes including 12 mm precarinal node (series 5, image 37). No enlarged axillary or hilar lymph nodes identified. Tracheostomy tube is present. Trachea  and esophagus within normal limits. No significant findings within the thyroid gland. Lungs/Pleura: Small bilateral pleural effusions. Loculated pleural fluid accumulation within the minor fissure on the right and within the oblique fissure on the left. Patchy bibasilar airspace opacities. Diffuse interlobular septal thickening with ground-glass attenuation throughout both lung fields. No pneumothorax. Upper Abdomen: Although only partially visualized at the edge of the field of view. Patient's percutaneous gastrostomy tube appears malpositioned with balloon appearing extraluminal positioned anterior to the left hepatic lobe (series 5, image 97). Musculoskeletal: No acute osseous abnormality.  No chest wall mass. Review of the MIP images confirms the above findings. IMPRESSION: 1. Negative for acute pulmonary embolism. 2. Findings suggestive of pulmonary edema with small bilateral pleural effusions. Loculated  pleural fluid accumulation within the minor fissure on the right and within the oblique fissure on the left. 3. Patchy bibasilar airspace opacities may represent edema, atelectasis, or pneumonia. 4. Patient's percutaneous gastrostomy tube appears malpositioned although is incompletely visualized at the edge of the field of view. CT of the abdomen could be obtained to further evaluate. 5. Mildly prominent mediastinal lymph nodes, likely reactive. 6. Aortic and coronary artery atherosclerosis (ICD10-I70.0). Electronically Signed   By: Davina Poke D.O.   On: 04/15/2020 15:00   DG Chest Port 1 View  Result Date: 04/15/2020 CLINICAL DATA:  Tachycardia and wheezing EXAM: PORTABLE CHEST 1 VIEW COMPARISON:  November 06, 2010. FINDINGS: Tracheostomy catheter tip is 4.3 cm above the carina. No pneumothorax. There is apparent interstitial pulmonary edema with partially loculated small left pleural effusion. Ill-defined opacity noted in left base. There is cardiomegaly with pulmonary venous hypertension. No  adenopathy. There is aortic atherosclerosis. No bone lesions. IMPRESSION: Tracheostomy as described without evident pneumothorax. Cardiomegaly with a degree of pulmonary vascular congestion. Evidence of interstitial pulmonary edema with small loculated pleural effusion. The appearance is suspicious for a degree of underlying congestive heart failure. Focal airspace opacity in the left base is concerning for focus of pneumonia, likely superimposed on a degree of interstitial pulmonary edema. Aortic Atherosclerosis (ICD10-I70.0). Electronically Signed   By: Lowella Grip III M.D.   On: 04/15/2020 13:55    Procedures Procedures   Medications Ordered in ED Medications  vancomycin (VANCOREADY) IVPB 1500 mg/300 mL (1,500 mg Intravenous New Bag/Given 04/15/20 1356)  vancomycin (VANCOREADY) IVPB 1250 mg/250 mL (has no administration in time range)  diltiazem (CARDIZEM) 1 mg/mL load via infusion 15 mg (has no administration in time range)    And  diltiazem (CARDIZEM) 125 mg in dextrose 5% 125 mL (1 mg/mL) infusion (has no administration in time range)  sodium chloride 0.9 % bolus 1,000 mL (0 mLs Intravenous Stopped 04/15/20 1402)  sodium chloride 0.9 % bolus 1,000 mL (0 mLs Intravenous Stopped 04/15/20 1455)  metoprolol tartrate (LOPRESSOR) injection 5 mg (5 mg Intravenous Given 04/15/20 1418)  iohexol (OMNIPAQUE) 350 MG/ML injection 75 mL (75 mLs Intravenous Contrast Given 04/15/20 1442)    ED Course  I have reviewed the triage vital signs and the nursing notes.  Pertinent labs & imaging results that were available during my care of the patient were reviewed by me and considered in my medical decision making (see chart for details).    MDM Rules/Calculators/A&P                          CHA2DS2-VASc Score = 9  The patient's score is based upon: CHF History: Yes HTN History: Yes Diabetes History: Yes Stroke History: Yes Vascular Disease History: Yes Age Score: 2 Gender Score: 1    The  patient's CHA2DS2-VASc score is 9, indicating a 12.2% annual risk of stroke.  Deferring anticoagulation due to significant comorbidities.  80 year old female presents to the ED from [facility] due to persistent tachycardia for the past 1.5 days. Patient treated with metoprolol with no change in HR prior to arrival. Upon arrival, patient afebrile, tachycardic at 129 with otherwise reassuring vitals. Patient chronically ill, nonverbal, tracheostomy dependent. Previous CVA with right-sided deficits.  Upon arrival, patient found to be in A. fib with RVR. PICC line in place. Patient is currently being treatment with Zosyn for pneumonia with last dose this morning. During initial evaluation, patient hypotensive and tachycardic, sepsis labs ordered  and IVFs and antibiotics given known pneumonia.   CBC reassuring with no leukocytosis.  Hemoglobin 8.1 which appears to be around patient's baseline from labs on 04/10/2020.  CMP significant for hyperglycemia 141 elevated AST at 58.  No anion gap.  Doubt DKA. COVID/influenza negative. CXR personally reviewed which demonstrates: IMPRESSION:  Tracheostomy as described without evident pneumothorax. Cardiomegaly  with a degree of pulmonary vascular congestion. Evidence of  interstitial pulmonary edema with small loculated pleural effusion.  The appearance is suspicious for a degree of underlying congestive  heart failure.    Focal airspace opacity in the left base is concerning for focus of  pneumonia, likely superimposed on a degree of interstitial pulmonary  edema.     Labs reassuring with no signs of sepsis. Patient continued to be tachycardic throughout her ED stay after metoprolol. CTA chest to rule out PE.  CTA personally reviewed which demonstrates: IMPRESSION:  1. Negative for acute pulmonary embolism.  2. Findings suggestive of pulmonary edema with small bilateral  pleural effusions. Loculated pleural fluid accumulation within the  minor  fissure on the right and within the oblique fissure on the  left.  3. Patchy bibasilar airspace opacities may represent edema,  atelectasis, or pneumonia.  4. Patient's percutaneous gastrostomy tube appears malpositioned  although is incompletely visualized at the edge of the field of  view. CT of the abdomen could be obtained to further evaluate.  5. Mildly prominent mediastinal lymph nodes, likely reactive.  6. Aortic and coronary artery atherosclerosis (ICD10-I70.0).   Given no PE, Diltiazem infusion and drip started to control HR. Suspect tachycardia most likely related to a. Fib.   Patient handed off to Dr. Kathrynn Humble pending CT abdomen to check g-tube placement and HR control. If HR unable to be controlled, patient may require admission. Final Clinical Impression(s) / ED Diagnoses Final diagnoses:  Atrial fibrillation with rapid ventricular response [facility])    Rx / DC Orders ED Discharge Orders    None       Karie Kirks 04/15/20 1559    Varney Biles, MD 04/15/20 2051

## 2020-04-15 NOTE — H&P (Incomplete)
History and Physical    Rebecca Harrell [id number] DOB: May 04, 1940 DOA: 04/15/2020  PCP: Aaron Edelman, MD   Patient coming from: [facility]   Chief Complaint: Tachycardia   HPI: Rebecca Harrell is a 80 y.o. 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   ED Course: Upon arrival to the ED, patient is found to be ***  Review of Systems:  All other systems reviewed and apart from HPI, are negative.  Past Medical History:  Diagnosis Date  . Diabetes mellitus without complication ([place])   . Dysrhythmia   . History of CVA (cerebrovascular accident) 04/15/2020  . Seizure disorder ([place]) 04/15/2020  . Stroke [facility])     Past Surgical History:  Procedure Laterality Date  . BELOW KNEE LEG AMPUTATION    . PEG TUBE PLACEMENT    . TRACHEOSTOMY      Social History:   reports that she has never smoked. She has never used smokeless tobacco. She reports previous alcohol use. No history on file for drug use.  Not on File  Family History  Problem Relation Age of Onset  . Diabetes type II Maternal Grandmother   . Hypertension Maternal Grandmother   . Macular degeneration Maternal Grandmother   . Diabetes type II Maternal Grandfather   . Hypertension Maternal Grandfather      Prior to Admission medications   Not on File    Physical Exam: Vitals:   04/15/20 2240 04/15/20 2250 04/15/20 2300 04/15/20 2334  BP: 134/78 (!) 128/97 136/83 (!) 152/76  Pulse: (!) 133 (!) 135 (!) 133 (!) 138  Resp: (!) 27 18 (!) 34 (!) 32  Temp:      TempSrc:      SpO2: 94% 97% 93%  94%  Weight:      Height:         Constitutional: NAD, calm  Eyes: PERTLA, lids and conjunctivae normal ENMT: Mucous membranes are moist. Posterior pharynx clear of any exudate or lesions.   Neck: normal, supple, no masses, no thyromegaly Respiratory: clear to auscultation bilaterally, no wheezing, no crackles. No accessory muscle use.  Cardiovascular: S1 & S2 heard, regular rate and rhythm. No extremity edema. No significant JVD. Abdomen: No distension, no tenderness, soft. Bowel sounds active.  Musculoskeletal: no clubbing / cyanosis. No joint deformity upper and lower extremities.   Skin: no significant rashes, lesions, ulcers. Warm, dry, well-perfused. Neurologic: CN 2-12 grossly intact. Sensation intact, DTR normal. Strength 5/5 in all 4 limbs.  Psychiatric: Alert and oriented to person, place, and situation. Pleasant and cooperative.    Labs and Imaging on Admission: I have personally reviewed following labs and imaging studies  CBC: Recent Labs  Lab 04/15/20 1253  WBC 6.9  NEUTROABS 4.9  HGB 8.1*  HCT 27.2*  MCV 95.4  PLT 123456   Basic Metabolic Panel: Recent Labs  Lab 04/15/20 1253  NA 135  K 4.5  CL 97*  CO2 27  GLUCOSE 141*  BUN 23  CREATININE 0.96  CALCIUM 8.8*   GFR: Estimated Creatinine Clearance: 52.5 mL/min (by C-G formula based on SCr of 0.96 mg/dL). Liver Function Tests: Recent Labs  Lab 04/15/20 1253  AST 58*  ALT 25  ALKPHOS 75  BILITOT 1.1  PROT 8.0  ALBUMIN 2.3*   No results for input(s): LIPASE, AMYLASE in the last 168 hours. No results for input(s): AMMONIA in the last 168 hours. Coagulation Profile: Recent Labs  Lab 04/15/20 1253  INR 1.2   Cardiac Enzymes: No results for input(s): CKTOTAL, CKMB, CKMBINDEX, TROPONINI in the last 168 hours. BNP (last 3 results) No results for input(s): PROBNP in the last 8760 hours. HbA1C: No results for input(s): HGBA1C in the last 72 hours. CBG: No results for input(s): GLUCAP in the last  168 hours. Lipid Profile: No results for input(s): CHOL, HDL, LDLCALC, TRIG, CHOLHDL, LDLDIRECT in the last 72 hours. Thyroid Function Tests: No results for input(s): TSH, T4TOTAL, FREET4, T3FREE, THYROIDAB in the last 72 hours. Anemia Panel: No results for input(s): VITAMINB12, FOLATE, FERRITIN, TIBC, IRON, RETICCTPCT in the last 72 hours. Urine analysis:    Component Value Date/Time   COLORURINE YELLOW 04/15/2020 1452   APPEARANCEUR HAZY (A) 04/15/2020 1452   LABSPEC 1.010 04/15/2020 1452   PHURINE 5.0 04/15/2020 1452   GLUCOSEU NEGATIVE 04/15/2020 1452   HGBUR MODERATE (A) 04/15/2020 1452   BILIRUBINUR NEGATIVE 04/15/2020 1452   KETONESUR NEGATIVE 04/15/2020 1452   PROTEINUR NEGATIVE 04/15/2020 1452   NITRITE NEGATIVE 04/15/2020 1452   LEUKOCYTESUR LARGE (A) 04/15/2020 1452   Sepsis Labs: '@LABRCNTIP'$ (procalcitonin:4,lacticidven:4) ) Recent Results (from the past 240 hour(s))  Resp Panel by RT-PCR (Flu A&B, Covid) Nasopharyngeal Swab     Status: None   Collection Time: 04/15/20 12:55 PM   Specimen: Nasopharyngeal Swab; Nasopharyngeal(NP) swabs in vial transport medium  Result Value Ref Range Status   SARS Coronavirus 2 by RT PCR NEGATIVE NEGATIVE Final    Comment: (NOTE) SARS-CoV-2 target nucleic acids are NOT DETECTED.  The SARS-CoV-2 RNA is generally detectable in upper respiratory specimens during the acute phase of infection. The lowest concentration of SARS-CoV-2 viral copies this assay can detect is 138 copies/mL. A negative result does not preclude SARS-Cov-2 infection and should not be used as the sole basis for treatment or other patient management decisions. A negative result may occur with  improper specimen collection/handling, submission of specimen other than nasopharyngeal swab, presence of viral mutation(s) within the areas targeted by this assay, and inadequate number of viral copies(<138 copies/mL). A negative result must be combined with clinical  observations, patient history, and epidemiological information. The expected result is Negative.  Fact Sheet for Patients:  EntrepreneurPulse.com.au  Fact Sheet for Healthcare Providers:  IncredibleEmployment.be  This test is no t yet approved or cleared by the [place] FDA and  has been authorized for detection and/or diagnosis of SARS-CoV-2 by FDA under an Emergency Use Authorization (EUA). This EUA will remain  in effect (meaning this test can be used) for the duration of the COVID-19 declaration under Section 564(b)(1) of the Act, 21 U.S.C.section 360bbb-3(b)(1), unless the authorization is terminated  or revoked sooner.       Influenza A by PCR NEGATIVE NEGATIVE Final   Influenza B by PCR NEGATIVE NEGATIVE Final    Comment: (NOTE) The Xpert Xpress SARS-CoV-2/FLU/RSV plus assay is intended as an aid in the diagnosis of influenza from Nasopharyngeal swab specimens and should not be used as a sole basis for treatment. Nasal washings and aspirates are unacceptable for Xpert Xpress SARS-CoV-2/FLU/RSV testing.  Fact Sheet for Patients: EntrepreneurPulse.com.au  Fact Sheet for Healthcare Providers: IncredibleEmployment.be  This test is not yet approved or cleared by the [place] FDA and has been authorized for detection and/or  diagnosis of SARS-CoV-2 by FDA under an Emergency Use Authorization (EUA). This EUA will remain in effect (meaning this test can be used) for the duration of the COVID-19 declaration under Section 564(b)(1) of the Act, 21 U.S.C. section 360bbb-3(b)(1), unless the authorization is terminated or revoked.  Performed at [facility], [place] [address] [place], [place] [address]      Radiological Exams on Admission: CT ABDOMEN WO CONTRAST  Result Date: 04/15/2020 CLINICAL DATA:  Possible G-tube malpositioning EXAM: CT ABDOMEN WITHOUT CONTRAST TECHNIQUE: Multidetector  CT imaging of the abdomen was performed following the standard protocol without IV contrast. COMPARISON:  CT chest same day FINDINGS: Lower chest: Cardiomegaly. Coronary artery calcifications. Small bilateral pleural effusions with patchy bibasilar opacities. Hepatobiliary: Unremarkable unenhanced appearance of the liver. No focal liver lesion identified. Gallbladder is decompressed. No hyperdense gallstone. No biliary dilatation. Pancreas: Unremarkable. No pancreatic ductal dilatation or surrounding inflammatory changes. Spleen: Grossly unremarkable. Adrenals/Urinary Tract: No definite adrenal nodule. Kidneys appear within normal limits. Excreted contrast within the bilateral renal collecting systems. No hydronephrosis. Stomach/Bowel: Percutaneous gastrostomy tube is appropriately positioned with balloon located in the anterior aspect of the distal gastric body. No evidence of outlet obstruction. Visualized bowel loops within the upper abdomen are within normal limits. No evidence of obstruction or inflammation. Vascular/Lymphatic: Extensive atherosclerosis throughout the aortoiliac axis. No upper abdominal lymphadenopathy. Other: No ascites or pneumoperitoneum. Musculoskeletal: No acute findings. IMPRESSION: 1. Percutaneous gastrostomy tube is appropriately positioned with balloon located intraluminally in the anterior aspect of the distal gastric body. No evidence of outlet obstruction. 2. No acute findings within the abdomen. 3. Small bilateral pleural effusions with patchy bibasilar opacities. Aortic Atherosclerosis (ICD10-I70.0). Electronically Signed   By: Davina Poke D.O.   On: 04/15/2020 16:16   CT Angio Chest PE W and/or Wo Contrast  Result Date: 04/15/2020 CLINICAL DATA:  Tachycardia EXAM: CT ANGIOGRAPHY CHEST WITH CONTRAST TECHNIQUE: Multidetector CT imaging of the chest was performed using the standard protocol during bolus administration of intravenous contrast. Multiplanar CT image  reconstructions and MIPs were obtained to evaluate the vascular anatomy. CONTRAST:  4m OMNIPAQUE IOHEXOL 350 MG/ML SOLN COMPARISON:  04/15/2020 FINDINGS: Cardiovascular: Satisfactory opacification of the pulmonary arteries to the segmental level. No evidence of pulmonary embolism. Thoracic aorta is nonaneurysmal. Common origin of the brachiocephalic and left common carotid arteries. Tortuosity of the descending thoracic aorta. Atherosclerotic calcifications of the aorta and coronary arteries. Heart size is mildly enlarged. No pericardial effusion. Mediastinum/Nodes: Mildly prominent mediastinal lymph nodes including 12 mm precarinal node (series 5, image 37). No enlarged axillary or hilar lymph nodes identified. Tracheostomy tube is present. Trachea and esophagus within normal limits. No significant findings within the thyroid gland. Lungs/Pleura: Small bilateral pleural effusions. Loculated pleural fluid accumulation within the minor fissure on the right and within the oblique fissure on the left. Patchy bibasilar airspace opacities. Diffuse interlobular septal thickening with ground-glass attenuation throughout both lung fields. No pneumothorax. Upper Abdomen: Although only partially visualized at the edge of the field of view. Patient's percutaneous gastrostomy tube appears malpositioned with balloon appearing extraluminal positioned anterior to the left hepatic lobe (series 5, image 97). Musculoskeletal: No acute osseous abnormality.  No chest wall mass. Review of the MIP images confirms the above findings. IMPRESSION: 1. Negative for acute pulmonary embolism. 2. Findings suggestive of pulmonary edema with small bilateral pleural effusions. Loculated pleural fluid accumulation within the minor fissure on the right and within the oblique fissure on the left. 3. Patchy bibasilar airspace opacities  may represent edema, atelectasis, or pneumonia. 4. Patient's percutaneous gastrostomy tube appears malpositioned  although is incompletely visualized at the edge of the field of view. CT of the abdomen could be obtained to further evaluate. 5. Mildly prominent mediastinal lymph nodes, likely reactive. 6. Aortic and coronary artery atherosclerosis (ICD10-I70.0). Electronically Signed   By: Davina Poke D.O.   On: 04/15/2020 15:00   DG Chest Port 1 View  Result Date: 04/15/2020 CLINICAL DATA:  Tachycardia and wheezing EXAM: PORTABLE CHEST 1 VIEW COMPARISON:  November 06, 2010. FINDINGS: Tracheostomy catheter tip is 4.3 cm above the carina. No pneumothorax. There is apparent interstitial pulmonary edema with partially loculated small left pleural effusion. Ill-defined opacity noted in left base. There is cardiomegaly with pulmonary venous hypertension. No adenopathy. There is aortic atherosclerosis. No bone lesions. IMPRESSION: Tracheostomy as described without evident pneumothorax. Cardiomegaly with a degree of pulmonary vascular congestion. Evidence of interstitial pulmonary edema with small loculated pleural effusion. The appearance is suspicious for a degree of underlying congestive heart failure. Focal airspace opacity in the left base is concerning for focus of pneumonia, likely superimposed on a degree of interstitial pulmonary edema. Aortic Atherosclerosis (ICD10-I70.0). Electronically Signed   By: Lowella Grip III M.D.   On: 04/15/2020 13:55    EKG: Independently reviewed. ***  Assessment/Plan Principal Problem:   Atrial fibrillation with RVR (HCC) Active Problems:   History of CVA (cerebrovascular accident)   Normocytic anemia   Tracheostomy dependence (HCC)   Hypotension   Chronic pulmonary aspiration   Seizure disorder (HCC)   Acute on chronic respiratory failure with hypoxia (HCC)   Pneumonia    1.  -  -  -  -  -  -  -  -   2.  -  -  -  -  -  -  -   3.  -  -  -  -  -  -  -   4.  -  -  -  -  -  -  -  -   5.  -  -  -  -  -  -  -   6.  -  -   -  -  -   7.  -  -  -  -  -  -   8.  -  -  -  -  -  -    DVT prophylaxis: Eliquis  Code Status: Full, confirmed with family  Level of Care: Level of care: Progressive Family Communication: Son updated in ED  Disposition Plan:  Patient is from: LTAC  Anticipated d/c is to: [facility]BD Anticipated d/c date is: 04/19/20 Patient currently: In rapid a fib, labored breathing  Consults called: Cardiology, PCCM  Admission status: Inpatient     Vianne Bulls, MD [facility]  04/15/2020, 11:51 PM

## 2020-04-15 NOTE — Progress Notes (Signed)
Pharmacy Antibiotic Note  Rebecca Harrell is a 80 y.o. female admitted on 04/15/2020 with sepsis.  Pharmacy has been consulted for Vancomycin dosing.   Height: '5\' 7"'$  (170.2 cm) Weight: 77.1 kg (170 lb) IBW/kg (Calculated) : 61.6  Temp (24hrs), Avg:98.9 F (37.2 C), Min:98.7 F (37.1 C), Max:99.1 F (37.3 C)  Recent Labs  Lab 04/15/20 1253  WBC 6.9  CREATININE 0.96  LATICACIDVEN 0.9    Estimated Creatinine Clearance: 52.5 mL/min (by C-G formula based on SCr of 0.96 mg/dL).    Not on File  Antimicrobials this admission: 3/6 Zosyn >>  3/13 Vancomycin >>   Dose adjustments this admission: N/a  Microbiology results: Pending   Plan:  - Zosyn 3.375g last given at [facility] at ~ 0830 - Vancomycin '1500mg'$  IV x 1 dose  - Followed by Vancomycin '1250mg'$  IV q24h - Est Calc AUC 511 - Monitor patients renal function and urine output  - De-escalate ABX when appropriate   Thank you for allowing pharmacy to be a part of this patient's care.  Duanne Limerick PharmD. BCPS 04/15/2020 2:38 PM

## 2020-04-15 NOTE — ED Notes (Signed)
Returned from CT at this time. Replaced on monitor. Medications requested from pharmacy.

## 2020-04-15 NOTE — ED Provider Notes (Signed)
Physical Exam  BP 123/66   Pulse (!) 133   Temp 98.3 F (36.8 C) (Oral)   Resp (!) 23   Ht '5\' 7"'$  (1.702 m)   Wt 77.1 kg   SpO2 95%   BMI 26.63 kg/m   Physical Exam  ED Course/Procedures     .Critical Care Performed by: Varney Biles, MD Authorized by: Varney Biles, MD   Critical care provider statement:    Critical care time (minutes):  132   Critical care was necessary to treat or prevent imminent or life-threatening deterioration of the following conditions:  Respiratory failure, circulatory failure and cardiac failure   Critical care was time spent personally by me on the following activities:  Discussions with consultants, evaluation of patient's response to treatment, examination of patient, ordering and performing treatments and interventions, ordering and review of laboratory studies, ordering and review of radiographic studies, pulse oximetry, re-evaluation of patient's condition, obtaining history from patient or surrogate and review of old charts    MDM   80 year old female comes in with chief complaint of elevated heart rate.  Patient has history of CVA and was admitted to [facility] on 1-30 after being found unresponsive in acute respiratory distress.  Patient was intubated and was found to be septic.  Ultimately she required tracheostomy and PEG tube placement.  Patient was transferred to [facility] on 03-16-20.  It appears that patient acquired COVID-19 at some point as well.  She also has history of CHF with preserved EF in the diagnosis.  Patient was sent to the ER because of persistent tachycardia.  Patient is noted to be in A. fib with RVR.  I spoke with [facility].  They informed me that patient started having elevated heart rate on Friday.  She has been on antibiotics for about 7 days for pneumonia.  She is getting Zosyn only for it.  Patient has been getting IV 5 mg metoprolol as needed for her A. fib.  She is not on any blood thinners.  Today the heart rate  was not coming down therefore she was sent to the ER.  There is no known history of PE, DVT.  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  She is currently not on any blood thinners and has had COVID at some point, therefore PE is a distinct possibility as the underlying cause.  It is also possible that the RVR is being driven by infection, however she does not have any fevers here and the white count is reassuring from the outside facility.   I have discussed the case with patient's son who is at the bedside in addition to calling [facility].  It is unclear whether patient was on Coumadin or not at some point.  The daughter reports that patient used to be on Coumadin, [facility] discontinued it.  It was also unclear when A. fib developed.  I clarified with [facility] that patient went transitioned to the nursing home part of [facility] did have A. Fib.  We do not know why anticoagulation was not started.  Family denies any history of bleeds they do not think the stroke was because of bleed.  Patient was given some IV fluid and it was presumed initially that sepsis was driving the A. fib with RVR.  Since she had received Zosyn in the morning, we added vancomycin and give some IV fluids.  Labs are overall reassuring.  CT angiogram was done and it did not show any evidence of PE.  There is some pulmonary edema, which is likely because of decompensated A. fib.  X-ray did show signs of pneumonia, that she is already being treated for.  We tried IV metoprolol, but there was no positive response to it.  We discussed the case with cardiology and have started patient on diltiazem drip and bolus.  Initially patient's heart rate did come down to 90s but it has slowly trended up.  Initial thought was that we might be able to discharge the patient back to [facility], but given that she failed weaning to oral meds, we will admit her to ICU.  Patient is full code      Varney Biles, MD 04/15/20 [phone]

## 2020-04-15 NOTE — ED Notes (Signed)
Called RT at this time d/t patient increased WOB at this time. Patient also seems to have increased mucus in upper airway.

## 2020-04-15 NOTE — ED Notes (Signed)
Attempted report at this time.

## 2020-04-15 NOTE — ED Triage Notes (Signed)
Pt BIB [facility] from [facility] for concerns of persistent tachycardia since receiving COVID booster last Friday. Pt noted to have bilateral wheezing with a trach that is repeatedly needing suctioning. [facility] tried to maintain this in house by doing duonebs and giving metoprolol 5 mg with no improvement. According to EMS pt is also being treated with abx for unknown reason. Pt has hx of stroke and DM that req'd trach and knee amputation of R leg. Pt noted to be tachycardic but afebrile upon triage.

## 2020-04-15 NOTE — ED Notes (Signed)
Pat to CT

## 2020-04-15 NOTE — Progress Notes (Signed)
RT posted Head of Bed sheet and placed patient on 8L/35% ATC. Patient is tolerating well at this time. RN has ordered spare trach. Ambu bag and suction catheters at bedside.

## 2020-04-16 ENCOUNTER — Inpatient Hospital Stay (HOSPITAL_COMMUNITY): Payer: Medicare Other

## 2020-04-16 DIAGNOSIS — Z8673 Personal history of transient ischemic attack (TIA), and cerebral infarction without residual deficits: Secondary | ICD-10-CM

## 2020-04-16 DIAGNOSIS — J9601 Acute respiratory failure with hypoxia: Secondary | ICD-10-CM | POA: Diagnosis not present

## 2020-04-16 DIAGNOSIS — I4891 Unspecified atrial fibrillation: Secondary | ICD-10-CM

## 2020-04-16 DIAGNOSIS — Z930 Tracheostomy status: Secondary | ICD-10-CM | POA: Diagnosis not present

## 2020-04-16 DIAGNOSIS — J810 Acute pulmonary edema: Secondary | ICD-10-CM | POA: Insufficient documentation

## 2020-04-16 LAB — ECHOCARDIOGRAM COMPLETE
Calc EF: 56.9 %
Height: 67 in
MV M vel: 4.72 m/s
MV Peak grad: 89.1 mmHg
P 1/2 time: 455 msec
Radius: 0.3 cm
S' Lateral: 3.9 cm
Single Plane A2C EF: 59.3 %
Single Plane A4C EF: 52.2 %
Weight: 2772.5 oz

## 2020-04-16 LAB — HEMOGLOBIN A1C
Hgb A1c MFr Bld: 7 % — ABNORMAL HIGH (ref 4.8–5.6)
Mean Plasma Glucose: 154.2 mg/dL

## 2020-04-16 LAB — BASIC METABOLIC PANEL
Anion gap: 11 (ref 5–15)
BUN: 23 mg/dL (ref 8–23)
CO2: 26 mmol/L (ref 22–32)
Calcium: 8.7 mg/dL — ABNORMAL LOW (ref 8.9–10.3)
Chloride: 101 mmol/L (ref 98–111)
Creatinine, Ser: 1.2 mg/dL — ABNORMAL HIGH (ref 0.44–1.00)
GFR, Estimated: 47 mL/min — ABNORMAL LOW (ref >60)
Glucose, Bld: 149 mg/dL — ABNORMAL HIGH (ref 70–99)
Potassium: 3.6 mmol/L (ref 3.5–5.1)
Sodium: 138 mmol/L (ref 135–145)

## 2020-04-16 LAB — CBC
HCT: 26.9 % — ABNORMAL LOW (ref 36.0–46.0)
Hemoglobin: 8.1 g/dL — ABNORMAL LOW (ref 12.0–15.0)
MCH: 28.3 pg (ref 26.0–34.0)
MCHC: 30.1 g/dL (ref 30.0–36.0)
MCV: 94.1 fL (ref 80.0–100.0)
Platelets: 242 10*3/uL (ref 150–400)
RBC: 2.86 MIL/uL — ABNORMAL LOW (ref 3.87–5.11)
RDW: 16 % — ABNORMAL HIGH (ref 11.5–15.5)
WBC: 8 10*3/uL (ref 4.0–10.5)
nRBC: 0 % (ref 0.0–0.2)

## 2020-04-16 LAB — HIV ANTIBODY (ROUTINE TESTING W REFLEX): HIV Screen 4th Generation wRfx: NONREACTIVE

## 2020-04-16 LAB — GLUCOSE, CAPILLARY
Glucose-Capillary: 100 mg/dL — ABNORMAL HIGH (ref 70–99)
Glucose-Capillary: 107 mg/dL — ABNORMAL HIGH (ref 70–99)
Glucose-Capillary: 112 mg/dL — ABNORMAL HIGH (ref 70–99)
Glucose-Capillary: 112 mg/dL — ABNORMAL HIGH (ref 70–99)
Glucose-Capillary: 121 mg/dL — ABNORMAL HIGH (ref 70–99)
Glucose-Capillary: 128 mg/dL — ABNORMAL HIGH (ref 70–99)

## 2020-04-16 LAB — PROCALCITONIN: Procalcitonin: <0.1 ng/mL

## 2020-04-16 LAB — MRSA PCR SCREENING: MRSA by PCR: NEGATIVE

## 2020-04-16 MED ORDER — SODIUM CHLORIDE 0.9% FLUSH
10.0000 mL | Freq: Two times a day (BID) | INTRAVENOUS | Status: DC
  Administered 2020-04-16 – 2020-05-04 (×34): 10 mL
  Administered 2020-05-05: 40 mL
  Administered 2020-05-05 – 2020-05-11 (×14): 10 mL

## 2020-04-16 MED ORDER — ORAL CARE MOUTH RINSE
15.0000 mL | Freq: Two times a day (BID) | OROMUCOSAL | Status: DC
  Administered 2020-04-16 – 2020-04-20 (×10): 15 mL via OROMUCOSAL

## 2020-04-16 MED ORDER — CHLORHEXIDINE GLUCONATE CLOTH 2 % EX PADS
6.0000 | MEDICATED_PAD | Freq: Every day | CUTANEOUS | Status: DC
  Administered 2020-04-17 – 2020-04-20 (×4): 6 via TOPICAL

## 2020-04-16 MED ORDER — APIXABAN 5 MG PO TABS
5.0000 mg | ORAL_TABLET | Freq: Two times a day (BID) | ORAL | Status: DC
  Administered 2020-04-16 – 2020-04-20 (×8): 5 mg
  Filled 2020-04-16 (×7): qty 1

## 2020-04-16 MED ORDER — IPRATROPIUM-ALBUTEROL 0.5-2.5 (3) MG/3ML IN SOLN
3.0000 mL | Freq: Three times a day (TID) | RESPIRATORY_TRACT | Status: DC
  Administered 2020-04-17 – 2020-04-18 (×4): 3 mL via RESPIRATORY_TRACT
  Filled 2020-04-16 (×4): qty 3

## 2020-04-16 MED ORDER — SODIUM CHLORIDE 0.9% FLUSH: 10.0000 mL | INTRAVENOUS | Status: DC | PRN

## 2020-04-16 MED ORDER — CHLORHEXIDINE GLUCONATE 0.12 % MT SOLN
15.0000 mL | Freq: Two times a day (BID) | OROMUCOSAL | Status: DC
  Administered 2020-04-16 – 2020-04-20 (×10): 15 mL via OROMUCOSAL
  Filled 2020-04-16 (×9): qty 15

## 2020-04-16 MED ORDER — DILTIAZEM HCL 60 MG PO TABS
30.0000 mg | ORAL_TABLET | Freq: Four times a day (QID) | ORAL | Status: DC
  Administered 2020-04-16 – 2020-04-17 (×4): 30 mg
  Filled 2020-04-16 (×4): qty 1

## 2020-04-16 NOTE — Progress Notes (Signed)
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 fib recently taken off of anticoagulation and currently under treatment for pneumonia at [facility] who presents for evaluation of persistent tachycardia despite IV Lopressor.  Patient required 20% FiO2 at the time of admission to LTAC 1 month ago, was a started on antibiotics 1 week ago for pneumonia and she is currently on IV Zosyn.  She reportedly became tachycardic and has been sustaining into the 120s and 150 range for the last 2 days despite IV Lopressor.  ED course: Patient was found to be afebrile saturating mid 90s on 8 L of oxygen supplementation, she was tachypneic in the mid 30s, tachycardic heart rate 130 blood pressure 86/45.  EKG showed A. fib RVR rate 140.  CTA chest was negative for PE but notable for pulmonary edema and a small bilateral pleural effusion as well as loculated effusion within the right minor fissure, patchy bibasilar airspace opacity.  CT abdomen was negative for acute finding and demonstrate percutaneous gastrostomy tube in appropriate position. -Started on Cardizem drip, allergy and CCM was consulted.   Assessment & Plan:   Principal Problem:   Atrial fibrillation with rapid ventricular response (HCC) Active Problems:   History of CVA (cerebrovascular accident)   Normocytic anemia   Tracheostomy dependence (HCC)   Hypotension   Chronic pulmonary aspiration   Seizure disorder (HCC)   Acute on chronic respiratory failure with hypoxia (HCC)   Pneumonia   1-A. fib with RVR: -Patient with history of PAF no longer on anticoagulation for  unclear reason presented with persistent tachycardia despite IV Lopressor. -Patient received IV, she was a started Cardizem drip and cardiology was consulted. -Cardiology  recommended to start Eliquis 5 mg twice daily. -Plan to check 2D echo -Plan to transition from IV Cardizem to oral Cardizem today.  2-Acute on chronic hypoxic respiratory failure, secondary to pneumonia and or chronic heart failure preserved ejection fraction exacerbation: -History of aspiration, trach dependence, requiring 28% FiO2 on admission at [facility] now requiring 35%. -CTA negative for PE with concern for pulmonary edema, small loculated effusion and possible infection lower lung. -CCM following, no plan for thoracentesis at this point, plan to continue with IV cefepime, azithromycin and vancomycin. -Continue with trach care -Sputum culture ordered.   3-History of CVA: -Residual right hemiparesis and dysphagia, trach and PEG dependent -Starting Eliquis in the setting of A. fib, hold aspirin -PEG tube for nutrition.  Plan to hold for during another 24 hours to avoid further aspiration  4-Seizure disorder: Continue with Keppra  5-Hypertension: Holding Norvasc and hydralazine, patient presented with hypotension  6-insulin-dependent diabetes: Continue with sliding scale insulin  7-Anemia: Monitor hemoglobin closely on Eliquis 8-UTI: UA with large leukocyte . urine growing more than 100,000 colonies of Enterococcus 5 cm.  Vancomycin.  Follow sensitivity   Estimated body mass index is 27.14 kg/m as calculated from the following:   Height as of this encounter: '5\' 7"'$  (1.702 m).   Weight as of this encounter: 78.6 kg.   DVT prophylaxis: Eliquis Code Status: Full code Family Communication: Disposition Plan:  Status is: Inpatient  Remains inpatient appropriate because:IV treatments appropriate due to intensity of illness or inability to take PO   Dispo: The  patient is from: LTAC              Anticipated d/c is  to: LTAC              Patient currently is not medically stable to d/c.   Difficult to place patient No        Consultants:   Cardiology  CCM  Procedures:   Echo  Antimicrobials:  Cefepime, azithromycin and vancomycin  Subjective: Patient opens eyes to voice, nonverbal  Objective: Vitals:   04/16/20 0426 04/16/20 0500 04/16/20 0510 04/16/20 0600  BP: 109/70  106/76 116/70  Pulse: (!) 126 (!) 115 84 87  Resp: (!) 25 16 (!) 29 (!) 27  Temp:   99.2 F (37.3 C) 99.2 F (37.3 C)  TempSrc:   Oral Oral  SpO2: 98% 93% 96% 100%  Weight:      Height:        Intake/Output Summary (Last 24 hours) at 04/16/2020 0759 Last data filed at 04/16/2020 0600 Gross per 24 hour  Intake 2824.65 ml  Output 600 ml  Net 2224.65 ml   Filed Weights   04/15/20 1208 04/15/20 1304 04/16/20 0339  Weight: 77.1 kg 77.1 kg 78.6 kg    Examination:  General exam: Chronic ill-appearing Respiratory system: Trach in place, collar Cardiovascular system: S1 & S2 heard, IRR Gastrointestinal system: Abdomen is nondistended, soft and nontender.  PEG tube in place Central nervous system: Open eyes to voice, aphasic, right side hemiparesis and contraction Extremities: Muscle atrophy, contraction    Data Reviewed: I have personally reviewed following labs and imaging studies  CBC: Recent Labs  Lab 04/15/20 1253 04/16/20 0132  WBC 6.9 8.0  NEUTROABS 4.9  --   HGB 8.1* 8.1*  HCT 27.2* 26.9*  MCV 95.4 94.1  PLT 225 [id number]   Basic Metabolic Panel: Recent Labs  Lab 04/15/20 1253 04/16/20 0132  NA 135 138  K 4.5 3.6  CL 97* 101  CO2 27 26  GLUCOSE 141* 149*  BUN 23 23  CREATININE 0.96 1.20*  CALCIUM 8.8* 8.7*   GFR: Estimated Creatinine Clearance: 42.4 mL/min (A) (by C-G formula based on SCr of 1.2 mg/dL (H)). Liver Function Tests: Recent Labs  Lab 04/15/20 1253  AST 58*  ALT 25  ALKPHOS 75  BILITOT 1.1  PROT 8.0  ALBUMIN 2.3*   No results for input(s): LIPASE, AMYLASE in  the last 168 hours. No results for input(s): AMMONIA in the last 168 hours. Coagulation Profile: Recent Labs  Lab 04/15/20 1253  INR 1.2   Cardiac Enzymes: No results for input(s): CKTOTAL, CKMB, CKMBINDEX, TROPONINI in the last 168 hours. BNP (last 3 results) No results for input(s): PROBNP in the last 8760 hours. HbA1C: Recent Labs    04/16/20 0132  HGBA1C 7.0*   CBG: Recent Labs  Lab 04/16/20 0451  GLUCAP 128*   Lipid Profile: No results for input(s): CHOL, HDL, LDLCALC, TRIG, CHOLHDL, LDLDIRECT in the last 72 hours. Thyroid Function Tests: No results for input(s): TSH, T4TOTAL, FREET4, T3FREE, THYROIDAB in the last 72 hours. Anemia Panel: No results for input(s): VITAMINB12, FOLATE, FERRITIN, TIBC, IRON, RETICCTPCT in the last 72 hours. Sepsis Labs: Recent Labs  Lab 04/15/20 1253 04/16/20 0132  PROCALCITON  --  <0.10  LATICACIDVEN 0.9  --     Recent Results (from the past 240 hour(s))  Resp Panel by RT-PCR (Flu A&B, Covid) Nasopharyngeal Swab     Status: None   Collection Time: 04/15/20 12:55 PM  Specimen: Nasopharyngeal Swab; Nasopharyngeal(NP) swabs in vial transport medium  Result Value Ref Range Status   SARS Coronavirus 2 by RT PCR NEGATIVE NEGATIVE Final    Comment: (NOTE) SARS-CoV-2 target nucleic acids are NOT DETECTED.  The SARS-CoV-2 RNA is generally detectable in upper respiratory specimens during the acute phase of infection. The lowest concentration of SARS-CoV-2 viral copies this assay can detect is 138 copies/mL. A negative result does not preclude SARS-Cov-2 infection and should not be used as the sole basis for treatment or other patient management decisions. A negative result may occur with  improper specimen collection/handling, submission of specimen other than nasopharyngeal swab, presence of viral mutation(s) within the areas targeted by this assay, and inadequate number of viral copies(<138 copies/mL). A negative result must be  combined with clinical observations, patient history, and epidemiological information. The expected result is Negative.  Fact Sheet for Patients:  EntrepreneurPulse.com.au  Fact Sheet for Healthcare Providers:  IncredibleEmployment.be  This test is no t yet approved or cleared by the [place] FDA and  has been authorized for detection and/or diagnosis of SARS-CoV-2 by FDA under an Emergency Use Authorization (EUA). This EUA will remain  in effect (meaning this test can be used) for the duration of the COVID-19 declaration under Section 564(b)(1) of the Act, 21 U.S.C.section 360bbb-3(b)(1), unless the authorization is terminated  or revoked sooner.       Influenza A by PCR NEGATIVE NEGATIVE Final   Influenza B by PCR NEGATIVE NEGATIVE Final    Comment: (NOTE) The Xpert Xpress SARS-CoV-2/FLU/RSV plus assay is intended as an aid in the diagnosis of influenza from Nasopharyngeal swab specimens and should not be used as a sole basis for treatment. Nasal washings and aspirates are unacceptable for Xpert Xpress SARS-CoV-2/FLU/RSV testing.  Fact Sheet for Patients: EntrepreneurPulse.com.au  Fact Sheet for Healthcare Providers: IncredibleEmployment.be  This test is not yet approved or cleared by the [place] FDA and has been authorized for detection and/or diagnosis of SARS-CoV-2 by FDA under an Emergency Use Authorization (EUA). This EUA will remain in effect (meaning this test can be used) for the duration of the COVID-19 declaration under Section 564(b)(1) of the Act, 21 U.S.C. section 360bbb-3(b)(1), unless the authorization is terminated or revoked.  Performed at [facility], [place] [address], [place], [place] [address]   MRSA PCR Screening     Status: None   Collection Time: 04/16/20  4:02 AM   Specimen: Nasal Mucosa; Nasopharyngeal  Result Value Ref Range Status   MRSA by PCR NEGATIVE  NEGATIVE Final    Comment:        The GeneXpert MRSA Assay (FDA approved for NASAL specimens only), is one component of a comprehensive MRSA colonization surveillance program. It is not intended to diagnose MRSA infection nor to guide or monitor treatment for MRSA infections. Performed at [facility], [place] [address], [place], [place] [address]          Radiology Studies: CT ABDOMEN WO CONTRAST  Result Date: 04/15/2020 CLINICAL DATA:  Possible G-tube malpositioning EXAM: CT ABDOMEN WITHOUT CONTRAST TECHNIQUE: Multidetector CT imaging of the abdomen was performed following the standard protocol without IV contrast. COMPARISON:  CT chest same day FINDINGS: Lower chest: Cardiomegaly. Coronary artery calcifications. Small bilateral pleural effusions with patchy bibasilar opacities. Hepatobiliary: Unremarkable unenhanced appearance of the liver. No focal liver lesion identified. Gallbladder is decompressed. No hyperdense gallstone. No biliary dilatation. Pancreas: Unremarkable. No pancreatic ductal dilatation or surrounding inflammatory changes. Spleen: Grossly  unremarkable. Adrenals/Urinary Tract: No definite adrenal nodule. Kidneys appear within normal limits. Excreted contrast within the bilateral renal collecting systems. No hydronephrosis. Stomach/Bowel: Percutaneous gastrostomy tube is appropriately positioned with balloon located in the anterior aspect of the distal gastric body. No evidence of outlet obstruction. Visualized bowel loops within the upper abdomen are within normal limits. No evidence of obstruction or inflammation. Vascular/Lymphatic: Extensive atherosclerosis throughout the aortoiliac axis. No upper abdominal lymphadenopathy. Other: No ascites or pneumoperitoneum. Musculoskeletal: No acute findings. IMPRESSION: 1. Percutaneous gastrostomy tube is appropriately positioned with balloon located intraluminally in the anterior aspect of the distal gastric body. No evidence of  outlet obstruction. 2. No acute findings within the abdomen. 3. Small bilateral pleural effusions with patchy bibasilar opacities. Aortic Atherosclerosis (ICD10-I70.0). Electronically Signed   By: Davina Poke D.O.   On: 04/15/2020 16:16   CT Angio Chest PE W and/or Wo Contrast  Result Date: 04/15/2020 CLINICAL DATA:  Tachycardia EXAM: CT ANGIOGRAPHY CHEST WITH CONTRAST TECHNIQUE: Multidetector CT imaging of the chest was performed using the standard protocol during bolus administration of intravenous contrast. Multiplanar CT image reconstructions and MIPs were obtained to evaluate the vascular anatomy. CONTRAST:  81m OMNIPAQUE IOHEXOL 350 MG/ML SOLN COMPARISON:  04/15/2020 FINDINGS: Cardiovascular: Satisfactory opacification of the pulmonary arteries to the segmental level. No evidence of pulmonary embolism. Thoracic aorta is nonaneurysmal. Common origin of the brachiocephalic and left common carotid arteries. Tortuosity of the descending thoracic aorta. Atherosclerotic calcifications of the aorta and coronary arteries. Heart size is mildly enlarged. No pericardial effusion. Mediastinum/Nodes: Mildly prominent mediastinal lymph nodes including 12 mm precarinal node (series 5, image 37). No enlarged axillary or hilar lymph nodes identified. Tracheostomy tube is present. Trachea and esophagus within normal limits. No significant findings within the thyroid gland. Lungs/Pleura: Small bilateral pleural effusions. Loculated pleural fluid accumulation within the minor fissure on the right and within the oblique fissure on the left. Patchy bibasilar airspace opacities. Diffuse interlobular septal thickening with ground-glass attenuation throughout both lung fields. No pneumothorax. Upper Abdomen: Although only partially visualized at the edge of the field of view. Patient's percutaneous gastrostomy tube appears malpositioned with balloon appearing extraluminal positioned anterior to the left hepatic lobe (series  5, image 97). Musculoskeletal: No acute osseous abnormality.  No chest wall mass. Review of the MIP images confirms the above findings. IMPRESSION: 1. Negative for acute pulmonary embolism. 2. Findings suggestive of pulmonary edema with small bilateral pleural effusions. Loculated pleural fluid accumulation within the minor fissure on the right and within the oblique fissure on the left. 3. Patchy bibasilar airspace opacities may represent edema, atelectasis, or pneumonia. 4. Patient's percutaneous gastrostomy tube appears malpositioned although is incompletely visualized at the edge of the field of view. CT of the abdomen could be obtained to further evaluate. 5. Mildly prominent mediastinal lymph nodes, likely reactive. 6. Aortic and coronary artery atherosclerosis (ICD10-I70.0). Electronically Signed   By: NDavina PokeD.O.   On: 04/15/2020 15:00   DG Chest Port 1 View  Result Date: 04/15/2020 CLINICAL DATA:  Tachycardia and wheezing EXAM: PORTABLE CHEST 1 VIEW COMPARISON:  November 06, 2010. FINDINGS: Tracheostomy catheter tip is 4.3 cm above the carina. No pneumothorax. There is apparent interstitial pulmonary edema with partially loculated small left pleural effusion. Ill-defined opacity noted in left base. There is cardiomegaly with pulmonary venous hypertension. No adenopathy. There is aortic atherosclerosis. No bone lesions. IMPRESSION: Tracheostomy as described without evident pneumothorax. Cardiomegaly with a degree of pulmonary vascular congestion. Evidence of interstitial pulmonary edema with  small loculated pleural effusion. The appearance is suspicious for a degree of underlying congestive heart failure. Focal airspace opacity in the left base is concerning for focus of pneumonia, likely superimposed on a degree of interstitial pulmonary edema. Aortic Atherosclerosis (ICD10-I70.0). Electronically Signed   By: Lowella Grip III M.D.   On: 04/15/2020 13:55        Scheduled Meds: .  apixaban  5 mg Oral BID  . budesonide (PULMICORT) nebulizer solution  0.25 mg Nebulization BID  . chlorhexidine  15 mL Mouth Rinse BID  . Chlorhexidine Gluconate Cloth  6 each Topical Daily  . furosemide  20 mg Per Tube Daily  . insulin aspart  0-6 Units Subcutaneous Q4H  . ipratropium-albuterol  3 mL Nebulization Q6H  . levETIRAcetam  750 mg Per Tube BID  . mouth rinse  15 mL Mouth Rinse q12n4p  . pantoprazole (PROTONIX) IV  40 mg Intravenous Daily  . sodium chloride flush  10-40 mL Intracatheter Q12H   Continuous Infusions: . azithromycin Stopped (04/16/20 0437)  . ceFEPime (MAXIPIME) IV Stopped (04/16/20 0547)  . diltiazem (CARDIZEM) infusion 15 mg/hr (04/16/20 0600)  . vancomycin       LOS: 1 day    Time spent: 35 minutes.     Elmarie Shiley, MD [facility]   If 7PM-7AM, please contact night-coverage [email or web address]  04/16/2020, 7:59 AM

## 2020-04-16 NOTE — Progress Notes (Signed)
Initial Nutrition Assessment  DOCUMENTATION CODES:   Not applicable  INTERVENTION:  Once able to restart tube feeds via PEG: Recommend Osmolite 1.5 cal formula at 20 ml/hr and increase by 10 ml every 4 hours to goal rate of 55 ml/hr.   Provide 45 ml Prosource TF once daily per tube.  Tube feeding to provide 2020 kcal, 94 grams of protein, and 1003 ml of free water.   NUTRITION DIAGNOSIS:   Inadequate oral intake related to inability to eat as evidenced by NPO status.  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 fib and currently under treatment for pneumonia at [facility] who presents for evaluation of persistent tachycardia.   Pt continues on trach collar and NPO status. Pt asleep during time of visit and did not awaken to RD assessment. Per MD, plans to hold PEG tube for nutrition for another 24 hours to avoid further aspiration. Tube feeding recommendations stated above once able to restart PEG feeds.   NUTRITION - FOCUSED PHYSICAL EXAM:  Flowsheet Row Most Recent Value  Orbital Region No depletion  Upper Arm Region No depletion  Thoracic and Lumbar Region No depletion  Buccal Region Unable to assess  Temple Region Unable to assess  Clavicle Bone Region No depletion  Clavicle and Acromion Bone Region No depletion  Scapular Bone Region Unable to assess  Dorsal Hand Unable to assess  Patellar Region No depletion  Anterior Thigh Region No depletion  Posterior Calf Region No depletion  Edema (RD Assessment)  Moderate  Hair Reviewed  Eyes Unable to assess  Mouth Reviewed  Skin Reviewed  Nails Unable to assess     Labs and medications reviewed.   Diet Order:   Diet Order            Diet NPO time specified  Diet effective now                 EDUCATION NEEDS:   Not appropriate for education at this time  Skin:  Skin Assessment: Skin Integrity Issues: Skin Integrity Issues:: Other (Comment) Other: left third toe necrotic, R BKA  Last BM:  3/14  Height:   Ht Readings from Last 1 Encounters:  04/15/20 '5\' 7"'$  (1.702 m)    Weight:   Wt Readings from Last 1 Encounters:  04/16/20 78.6 kg   BMI:  Body mass index is 27.14 kg/m.  Estimated Nutritional Needs:   Kcal:  1850-2100  Protein:  90-105 grams  Fluid:  >/= 1.8 L/day  Corrin Parker, MS, RD, LDN RD pager number/after hours weekend pager number on Amion.

## 2020-04-16 NOTE — Progress Notes (Signed)
   04/16/20 0140  Assess: MEWS Score  Temp 99.2 F (37.3 C)  BP 124/74  Pulse Rate (!) 132  ECG Heart Rate (!) 124  Resp (!) 9  Level of Consciousness Alert  SpO2 99 %  Assess: MEWS Score  MEWS Temp 0  MEWS Systolic 0  MEWS Pulse 2  MEWS RR 1  MEWS LOC 0  MEWS Score 3  MEWS Score Color Yellow  Assess: if the MEWS score is Yellow or Red  Were vital signs taken at a resting state? No  Focused Assessment No change from prior assessment (initial assessment)  Early Detection of Sepsis Score *See Row Information* Low  MEWS guidelines implemented *See Row Information* Yes  Treat  Pain Scale 0-10  Pain Score 0

## 2020-04-16 NOTE — Progress Notes (Signed)
Patient admitted from ED. On trach collar at admission. Patient with copious amounts of secretions. Nodding yes and no appropriately. No c/o pain. Will continue to monitor.

## 2020-04-16 NOTE — Progress Notes (Addendum)
Progress Note  Patient Name: Rebecca Harrell Date of Encounter: 04/16/2020  Primary Cardiologist: New   Subjective   Doing ok today. Nods appropriately to questioning. Rates improved on IV Diltiazem   Inpatient Medications    Scheduled Meds: . apixaban  5 mg Oral BID  . budesonide (PULMICORT) nebulizer solution  0.25 mg Nebulization BID  . chlorhexidine  15 mL Mouth Rinse BID  . Chlorhexidine Gluconate Cloth  6 each Topical Daily  . furosemide  20 mg Per Tube Daily  . insulin aspart  0-6 Units Subcutaneous Q4H  . ipratropium-albuterol  3 mL Nebulization Q6H  . levETIRAcetam  750 mg Per Tube BID  . mouth rinse  15 mL Mouth Rinse q12n4p  . pantoprazole (PROTONIX) IV  40 mg Intravenous Daily  . sodium chloride flush  10-40 mL Intracatheter Q12H   Continuous Infusions: . azithromycin Stopped (04/16/20 0437)  . ceFEPime (MAXIPIME) IV Stopped (04/16/20 0547)  . diltiazem (CARDIZEM) infusion 15 mg/hr (04/16/20 0600)  . vancomycin     PRN Meds: acetaminophen, morphine injection, ondansetron (ZOFRAN) IV, sodium chloride flush, sodium chloride HYPERTONIC   Vital Signs    Vitals:   04/16/20 0426 04/16/20 0500 04/16/20 0510 04/16/20 0600  BP: 109/70  106/76 116/70  Pulse: (!) 126 (!) 115 84 87  Resp: (!) 25 (!) 0 (!) 29 (!) 27  Temp:   99.2 F (37.3 C) 99.2 F (37.3 C)  TempSrc:   Oral Oral  SpO2: 98% 93% 96% 100%  Weight:      Height:        Intake/Output Summary (Last 24 hours) at 04/16/2020 0650 Last data filed at 04/16/2020 0600 Gross per 24 hour  Intake 2824.65 ml  Output 600 ml  Net 2224.65 ml   Filed Weights   04/15/20 1208 04/15/20 1304 04/16/20 0339  Weight: 77.1 kg 77.1 kg 78.6 kg    Physical Exam   General: Elderly, NAD Neck: Negative for carotid bruits. No JVD Lungs: Diminished in bilateral lower lobes. Breathing is unlabored. Cardiovascular: Irregularly irregular. No murmurs Abdomen: Soft, non-tender, non-distended. No obvious abdominal  masses. Extremities: No edema. Radial pulses 2+ bilaterally Neuro: Unable to assess. Psych: Nods to questions appropriately with normal affect.    Labs    Chemistry Recent Labs  Lab 04/15/20 1253 04/16/20 0132  NA 135 138  K 4.5 3.6  CL 97* 101  CO2 27 26  GLUCOSE 141* 149*  BUN 23 23  CREATININE 0.96 1.20*  CALCIUM 8.8* 8.7*  PROT 8.0  --   ALBUMIN 2.3*  --   AST 58*  --   ALT 25  --   ALKPHOS 75  --   BILITOT 1.1  --   GFRNONAA >60 47*  ANIONGAP 11 11     Hematology Recent Labs  Lab 04/15/20 1253 04/16/20 0132  WBC 6.9 8.0  RBC 2.85* 2.86*  HGB 8.1* 8.1*  HCT 27.2* 26.9*  MCV 95.4 94.1  MCH 28.4 28.3  MCHC 29.8* 30.1  RDW 15.9* 16.0*  PLT 225 242    Cardiac EnzymesNo results for input(s): TROPONINI in the last 168 hours. No results for input(s): TROPIPOC in the last 168 hours.   BNPNo results for input(s): BNP, PROBNP in the last 168 hours.   DDimer No results for input(s): DDIMER in the last 168 hours.   Radiology    CT ABDOMEN WO CONTRAST  Result Date: 04/15/2020 CLINICAL DATA:  Possible G-tube malpositioning EXAM: CT ABDOMEN WITHOUT CONTRAST TECHNIQUE:  Multidetector CT imaging of the abdomen was performed following the standard protocol without IV contrast. COMPARISON:  CT chest same day FINDINGS: Lower chest: Cardiomegaly. Coronary artery calcifications. Small bilateral pleural effusions with patchy bibasilar opacities. Hepatobiliary: Unremarkable unenhanced appearance of the liver. No focal liver lesion identified. Gallbladder is decompressed. No hyperdense gallstone. No biliary dilatation. Pancreas: Unremarkable. No pancreatic ductal dilatation or surrounding inflammatory changes. Spleen: Grossly unremarkable. Adrenals/Urinary Tract: No definite adrenal nodule. Kidneys appear within normal limits. Excreted contrast within the bilateral renal collecting systems. No hydronephrosis. Stomach/Bowel: Percutaneous gastrostomy tube is appropriately positioned  with balloon located in the anterior aspect of the distal gastric body. No evidence of outlet obstruction. Visualized bowel loops within the upper abdomen are within normal limits. No evidence of obstruction or inflammation. Vascular/Lymphatic: Extensive atherosclerosis throughout the aortoiliac axis. No upper abdominal lymphadenopathy. Other: No ascites or pneumoperitoneum. Musculoskeletal: No acute findings. IMPRESSION: 1. Percutaneous gastrostomy tube is appropriately positioned with balloon located intraluminally in the anterior aspect of the distal gastric body. No evidence of outlet obstruction. 2. No acute findings within the abdomen. 3. Small bilateral pleural effusions with patchy bibasilar opacities. Aortic Atherosclerosis (ICD10-I70.0). Electronically Signed   By: Davina Poke D.O.   On: 04/15/2020 16:16   CT Angio Chest PE W and/or Wo Contrast  Result Date: 04/15/2020 CLINICAL DATA:  Tachycardia EXAM: CT ANGIOGRAPHY CHEST WITH CONTRAST TECHNIQUE: Multidetector CT imaging of the chest was performed using the standard protocol during bolus administration of intravenous contrast. Multiplanar CT image reconstructions and MIPs were obtained to evaluate the vascular anatomy. CONTRAST:  39m OMNIPAQUE IOHEXOL 350 MG/ML SOLN COMPARISON:  04/15/2020 FINDINGS: Cardiovascular: Satisfactory opacification of the pulmonary arteries to the segmental level. No evidence of pulmonary embolism. Thoracic aorta is nonaneurysmal. Common origin of the brachiocephalic and left common carotid arteries. Tortuosity of the descending thoracic aorta. Atherosclerotic calcifications of the aorta and coronary arteries. Heart size is mildly enlarged. No pericardial effusion. Mediastinum/Nodes: Mildly prominent mediastinal lymph nodes including 12 mm precarinal node (series 5, image 37). No enlarged axillary or hilar lymph nodes identified. Tracheostomy tube is present. Trachea and esophagus within normal limits. No significant  findings within the thyroid gland. Lungs/Pleura: Small bilateral pleural effusions. Loculated pleural fluid accumulation within the minor fissure on the right and within the oblique fissure on the left. Patchy bibasilar airspace opacities. Diffuse interlobular septal thickening with ground-glass attenuation throughout both lung fields. No pneumothorax. Upper Abdomen: Although only partially visualized at the edge of the field of view. Patient's percutaneous gastrostomy tube appears malpositioned with balloon appearing extraluminal positioned anterior to the left hepatic lobe (series 5, image 97). Musculoskeletal: No acute osseous abnormality.  No chest wall mass. Review of the MIP images confirms the above findings. IMPRESSION: 1. Negative for acute pulmonary embolism. 2. Findings suggestive of pulmonary edema with small bilateral pleural effusions. Loculated pleural fluid accumulation within the minor fissure on the right and within the oblique fissure on the left. 3. Patchy bibasilar airspace opacities may represent edema, atelectasis, or pneumonia. 4. Patient's percutaneous gastrostomy tube appears malpositioned although is incompletely visualized at the edge of the field of view. CT of the abdomen could be obtained to further evaluate. 5. Mildly prominent mediastinal lymph nodes, likely reactive. 6. Aortic and coronary artery atherosclerosis (ICD10-I70.0). Electronically Signed   By: NDavina PokeD.O.   On: 04/15/2020 15:00   DG Chest Port 1 View  Result Date: 04/15/2020 CLINICAL DATA:  Tachycardia and wheezing EXAM: PORTABLE CHEST 1 VIEW COMPARISON:  November 06, 2010. FINDINGS: Tracheostomy catheter tip is 4.3 cm above the carina. No pneumothorax. There is apparent interstitial pulmonary edema with partially loculated small left pleural effusion. Ill-defined opacity noted in left base. There is cardiomegaly with pulmonary venous hypertension. No adenopathy. There is aortic atherosclerosis. No bone  lesions. IMPRESSION: Tracheostomy as described without evident pneumothorax. Cardiomegaly with a degree of pulmonary vascular congestion. Evidence of interstitial pulmonary edema with small loculated pleural effusion. The appearance is suspicious for a degree of underlying congestive heart failure. Focal airspace opacity in the left base is concerning for focus of pneumonia, likely superimposed on a degree of interstitial pulmonary edema. Aortic Atherosclerosis (ICD10-I70.0). Electronically Signed   By: Lowella Grip III M.D.   On: 04/15/2020 13:55   Telemetry    04/16/20 AF with rates in the 90's- Personally Reviewed  ECG    No new tracing as of 04/16/20 - Personally Reviewed  Cardiac Studies   Echocardiogram:   Result date: 12/07/08 No evidence of intra-cardiac shunting by agitated IV saline contrast  No evidence of vegetation.  Mild concentric left ventricular hypertrophy is observed.  Global left ventricular wall motion and contractility are within normal  limits.  The ejection fraction is estimated to be 65%.  Evidence of mild mitral annular calcification.  There is mild dilatation of the ascending aorta.  Rhythm AF    Patient Profile     80 y.o. female with a hx of PAF, DM2, HTN, seizures, HFpEF, CVA with residual R sided deficits, GERD and trach who presented from [place] 2/2 ongoing AF/RVR since Friday.   Assessment & Plan    1. Atrial fibrillation with RVR: -Pt has a hx of PAF dating back to 2010 not on anticoagulation for unclear reasons per record review who presented to [facility] from [facility] with persistent AF with rates in the 125-150 range. She has recently been treated for PNA which appears to be chronic due to aspiration PNA resulting in tracheostomy placement and transition to [facility]. She was given IV metoprolol with no improvement -Recommend PO Eliquis given elevated CHA2DS2VASc 7 (age x2, CVA x2, DM, HTN, gender) -Continue IV diltiazem for  now and transition to PO dosing when able -would recommend repeat echo to follow LV function>>previous assessment with LV at 65% -Rates are better control in the 90's   2. Acute on chronic hypoxic respiratory failure failure: -s/p trach>>>PCCM consulted -Management per IM and   3. 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 HTN: -Hypotensive on admission which responded to IVF  -Holding antihypertensives -BPs>>116/70>>106/76>>109/70  5. DM2: -SSI for glucose control -Per IM    Signed, Kathyrn Drown NP-C [facility] Pager: [phone] 04/16/2020, 6:50 AM    Personally seen and examined. Agree with APP above with the following comments: Briefly 80 yo F with chronic respiratory failure In talking with team; family has full goals of care Patient nods with some movement Exam notable for R BKA, Left foot pressure ulcer present on admission, and feeding tube Personally reviewed relevant tests; rates better controlled on diltiazem drip with stable pressure Would recommend - starting diltiazem 30 mg Per tube Q6hr with transition off of diltiazem drip (ordered) - eliquis is reasonable - echocardiogram ordered given family's GOC; though I have concerns this may not change overall trajectory of her course.  Discussed with primary MD; will continue to follow  Rudean Haskell, MD [facility]  [address], [address] [place], [place] [address] [phone]  1:09 PM      For questions or updates, please contact   Please consult [email or web address] for contact info under Cardiology/STEMI.

## 2020-04-16 NOTE — Consult Note (Addendum)
NAME:  Rebecca Harrell, MRN:  [id number], DOB:  [birth year], LOS: 1 ADMISSION DATE:  04/15/2020, CONSULTATION DATE:  04/16/2020 REFERRING MD:  Dr. Myna Hidalgo, CHIEF COMPLAINT:  04/16/2020  Brief History:  89 yoF with chronic respiratory failure s/p trach sent from [facility] for Afib with RVR.  Patient is also being treated for pneumonia on zosyn pta.  CXR concerning for possible partially loculated effusion.  Pulmonary consulted for further evaluation.   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  Found to be in Afib with RVR and initially hypotensive, which resolved after 2L NS bolus.  Patient afebrile and remains on ATC trial.   CTA without PE, notable for pulmonary edema with small bilateral pulmonary effusions and loculated pleural fluid accumulation within R minor fissure and  Oblique on the L.  She has been afebrile with no lactic acidosis or leukocytosis  Past Medical History:  hypertension, insulin-dependent diabetes mellitus, chronic diastolic CHF, seizure disorder, history of CVA with right-sided hemiparesis, chronic hypoxic respiratory failure, aspiration with tracheostomy and PEG tube dependence, paroxysmal atrial fibrillation recently taken off anticoagulation  Significant Hospital Events:  3/13 Admitted [facility]  Consults:  Cardiology Pulmonary   Procedures:  pta trach  Significant Diagnostic Tests:  3/13 CTA PE >> 1. Negative for acute pulmonary embolism. 2. Findings suggestive of pulmonary edema with small bilateral pleural effusions. Loculated pleural fluid  accumulation within the minor fissure on the right and within the oblique fissure on the left. 3. Patchy bibasilar airspace opacities may represent edema, atelectasis, or pneumonia. 4. Patient's percutaneous gastrostomy tube appears malpositioned although is incompletely visualized at the edge of the field of view. CT of the abdomen could be obtained to further evaluate. 5. Mildly prominent mediastinal lymph nodes, likely reactive. 6. Aortic and coronary artery atherosclerosis   3/13 CT abd/ pelvis >> 1. Percutaneous gastrostomy tube is appropriately positioned with balloon located intraluminally in the anterior aspect of the distal gastric body. No evidence of outlet obstruction. 2. No acute findings within the abdomen. 3. Small bilateral pleural effusions with patchy bibasilar opacities. Aortic Atherosclerosis   Micro Data:  3/13 BCx>> 3/13 SARS 2/ flu >> neg 3/13 UCx >> 3/13 sputum cx >> 3/14 MRSA >> neg  Antimicrobials:  pta zosyn 3/13 azithro >> 3/13 cefepime >> 3/13 vanc >>  Interim History / Subjective:  Pt awake and in no distress, remains on trach collar 8L 35%   Objective   Blood pressure 116/70, pulse 87, temperature 99.2 F (37.3 C), temperature source Oral, resp. rate (!) 27, height '5\' 7"'$  (1.702 m), weight 78.6 kg, SpO2 100 %.    FiO2 (%):  [35 %] 35 %   Intake/Output Summary (Last 24 hours) at 04/16/2020 [id number] Last data filed at 04/16/2020 0600 Gross per 24 hour  Intake 2824.65 ml  Output 600 ml  Net 2224.65 ml   Filed Weights   04/15/20 1208 04/15/20 1304 04/16/20 0339  Weight: 77.1 kg 77.1 kg 78.6 kg   General: Elderly, chronically ill-appearing female awake, no obvious distress HEENT: MM pink/moist, trach in place, copious white secretions Neuro: Eyes open, tracks, not otherwise interactive CV: s1s2 tachycardic, regular, no m/r/g PULM: Coarse breath sounds throughout,  on ATC, no tachypnea or accessory muscle use GI: soft, bsx4 active  Extremities:  warm/dry,  edema, right leg amputation Skin: no rashes or lesions   Resolved Hospital Problem list    Assessment & Plan:   Acute onChronic hypoxic respiratory failure HCAP Loculated pleural effusion  From records, appears pt is usually on 20L trach collar, up to 35L on admission P: -CTA PE showing loculated pleural effusion- concern for empyema, reassuring that patient is afebrile with no lactic acidosis and procalcitonin < 0.10 -no current indication for chest tube as effusion in the fissures -routine trach care -pulmonary toilet -Follow sputum cx -continue broad spectrum abx per primary  -cardiology following, continue Lasix '20mg'$  daily -continue duonebs and pulmicort  Atrial Fibrillation Worsening tachycardia the reason for initial EMS call from [facility], not improved with Metoprolol, not on anti-coagulation for unclear reasons P: -Eliquis initiated by cardiology -rate better controlled on Diltiazem without hypotension -consider Echo   Thank you for this consult, PCCM will continue to follow with you.  Best practice (evaluated daily)  Diet: per primary Pain/Anxiety/Delirium protocol (if indicated): n/a VAP protocol (if indicated): n/a DVT prophylaxis: Eliquis GI prophylaxis: n/a Glucose control: SSI Mobility: bed rest Disposition: progressive care  Goals of Care:  Last date of multidisciplinary goals of care discussion: per primary Family and staff present:  Summary of discussion:  Follow up goals of care discussion due:  Code Status: full code  Labs   CBC: Recent Labs  Lab 04/15/20 1253 04/16/20 0132  WBC 6.9 8.0  NEUTROABS 4.9  --   HGB 8.1* 8.1*  HCT 27.2* 26.9*  MCV 95.4 94.1  PLT 225 [id number]    Basic Metabolic Panel: Recent Labs  Lab 04/15/20 1253 04/16/20 0132  NA 135 138  K 4.5 3.6  CL 97* 101  CO2 27 26  GLUCOSE 141* 149*  BUN 23 23  CREATININE 0.96 1.20*  CALCIUM 8.8* 8.7*   GFR: Estimated Creatinine Clearance: 42.4 mL/min (A) (by  C-G formula based on SCr of 1.2 mg/dL (H)). Recent Labs  Lab 04/15/20 1253 04/16/20 0132  PROCALCITON  --  <0.10  WBC 6.9 8.0  LATICACIDVEN 0.9  --     Liver Function Tests: Recent Labs  Lab 04/15/20 1253  AST 58*  ALT 25  ALKPHOS 75  BILITOT 1.1  PROT 8.0  ALBUMIN 2.3*   No results for input(s): LIPASE, AMYLASE in the last 168 hours. No results for input(s): AMMONIA in the last 168 hours.  ABG No results found for: PHART, PCO2ART, PO2ART, HCO3, TCO2, ACIDBASEDEF, O2SAT   Coagulation Profile: Recent Labs  Lab 04/15/20 1253  INR 1.2    Cardiac Enzymes: No results for input(s): CKTOTAL, CKMB, CKMBINDEX, TROPONINI in the last 168 hours.  HbA1C: Hgb A1c MFr Bld  Date/Time Value Ref Range Status  04/16/2020 01:32 AM 7.0 (H) 4.8 - 5.6 % Final    Comment:    (NOTE) Pre diabetes:          5.7%-6.4%  Diabetes:              >6.4%  Glycemic control for   <7.0% adults with diabetes     CBG: Recent Labs  Lab 04/16/20 0451  GLUCAP 128*    Review of Systems:   Unable to obtain secondary to mental status  Past Medical History:  She,  has a past medical history of Diabetes mellitus without complication ([place]), Dysrhythmia, History of CVA (cerebrovascular accident) (04/15/2020), Seizure disorder ([place]) (04/15/2020), and Stroke (  [facility]).   Surgical History:   Past Surgical History:  Procedure Laterality Date  . BELOW KNEE LEG AMPUTATION    . PEG TUBE PLACEMENT    . TRACHEOSTOMY       Social History:   reports that she has never smoked. She has never used smokeless tobacco. She reports previous alcohol use.   Family History:  Her family history includes Diabetes type II in her maternal grandfather and maternal grandmother; Hypertension in her maternal grandfather and maternal grandmother; Macular degeneration in her maternal grandmother.   Allergies Not on File   Home Medications  Prior to Admission medications   Not on File     Critical care time: 42  minutes    Otilio Carpen Emmalina Espericueta, PA-C [facility] See Amion for pager If no response to pager , please call [phone] until 7pm After 7:00 pm call Elink  [phone]?[place]

## 2020-04-16 NOTE — Progress Notes (Signed)
RT unable to obtain sputum culture at this time. Will try again later.

## 2020-04-16 NOTE — Progress Notes (Addendum)
Heart Failure Navigator Progress Note  Assessed for Heart & Vascular TOC clinic readiness.  Unfortunately at this time the patient does not meet criteria due to pt from [facility] with long term trach and PEG dependence.   Navigator available for reassessment of patient.   Pricilla Holm, RN, BSN Heart Failure Nurse Navigator [phone]

## 2020-04-17 ENCOUNTER — Inpatient Hospital Stay (HOSPITAL_COMMUNITY): Payer: Medicare Other

## 2020-04-17 DIAGNOSIS — J9601 Acute respiratory failure with hypoxia: Secondary | ICD-10-CM | POA: Diagnosis not present

## 2020-04-17 DIAGNOSIS — I4891 Unspecified atrial fibrillation: Secondary | ICD-10-CM | POA: Diagnosis not present

## 2020-04-17 DIAGNOSIS — Z8673 Personal history of transient ischemic attack (TIA), and cerebral infarction without residual deficits: Secondary | ICD-10-CM | POA: Diagnosis not present

## 2020-04-17 LAB — CBC
HCT: 26.7 % — ABNORMAL LOW (ref 36.0–46.0)
Hemoglobin: 7.9 g/dL — ABNORMAL LOW (ref 12.0–15.0)
MCH: 28.4 pg (ref 26.0–34.0)
MCHC: 29.6 g/dL — ABNORMAL LOW (ref 30.0–36.0)
MCV: 96 fL (ref 80.0–100.0)
Platelets: 221 10*3/uL (ref 150–400)
RBC: 2.78 MIL/uL — ABNORMAL LOW (ref 3.87–5.11)
RDW: 16.1 % — ABNORMAL HIGH (ref 11.5–15.5)
WBC: 6.4 10*3/uL (ref 4.0–10.5)
nRBC: 0 % (ref 0.0–0.2)

## 2020-04-17 LAB — BASIC METABOLIC PANEL
Anion gap: 11 (ref 5–15)
BUN: 27 mg/dL — ABNORMAL HIGH (ref 8–23)
CO2: 24 mmol/L (ref 22–32)
Calcium: 8.4 mg/dL — ABNORMAL LOW (ref 8.9–10.3)
Chloride: 104 mmol/L (ref 98–111)
Creatinine, Ser: 1.42 mg/dL — ABNORMAL HIGH (ref 0.44–1.00)
GFR, Estimated: 38 mL/min — ABNORMAL LOW (ref >60)
Glucose, Bld: 104 mg/dL — ABNORMAL HIGH (ref 70–99)
Potassium: 3.1 mmol/L — ABNORMAL LOW (ref 3.5–5.1)
Sodium: 139 mmol/L (ref 135–145)

## 2020-04-17 LAB — URINE CULTURE: Culture: >=100000 — AB

## 2020-04-17 LAB — GLUCOSE, CAPILLARY
Glucose-Capillary: 98 mg/dL (ref 70–99)
Glucose-Capillary: 113 mg/dL — ABNORMAL HIGH (ref 70–99)
Glucose-Capillary: 150 mg/dL — ABNORMAL HIGH (ref 70–99)
Glucose-Capillary: 177 mg/dL — ABNORMAL HIGH (ref 70–99)
Glucose-Capillary: 175 mg/dL — ABNORMAL HIGH (ref 70–99)

## 2020-04-17 LAB — PROCALCITONIN: Procalcitonin: 0.13 ng/mL

## 2020-04-17 MED ORDER — DILTIAZEM HCL 60 MG PO TABS
60.0000 mg | ORAL_TABLET | Freq: Four times a day (QID) | ORAL | Status: DC
  Administered 2020-04-17 – 2020-04-18 (×4): 60 mg
  Filled 2020-04-17 (×4): qty 1

## 2020-04-17 MED ORDER — POTASSIUM CHLORIDE 10 MEQ/100ML IV SOLN
10.0000 meq | INTRAVENOUS | Status: AC
  Administered 2020-04-17 (×3): 10 meq via INTRAVENOUS
  Filled 2020-04-17 (×3): qty 100

## 2020-04-17 MED ORDER — OSMOLITE 1.5 CAL PO LIQD
1000.0000 mL | ORAL | Status: DC
  Administered 2020-04-17 – 2020-04-27 (×11): 1000 mL
  Filled 2020-04-17 (×5): qty 1000

## 2020-04-17 MED ORDER — LINEZOLID 600 MG/300ML IV SOLN
600.0000 mg | Freq: Two times a day (BID) | INTRAVENOUS | Status: AC
  Administered 2020-04-17 – 2020-04-19 (×6): 600 mg via INTRAVENOUS
  Filled 2020-04-17 (×7): qty 300

## 2020-04-17 NOTE — Plan of Care (Signed)

## 2020-04-17 NOTE — Progress Notes (Signed)
°   04/16/20 2330  Assess: MEWS Score  Temp 98.6 F (37 C)  BP 131/62  Pulse Rate (!) 120  ECG Heart Rate (!) 120  Resp 20  Level of Consciousness Alert  SpO2 99 %  O2 Device Tracheostomy Collar  O2 Flow Rate (L/min) 8 L/min  FiO2 (%) 35 %  Assess: if the MEWS score is Yellow or Red  Were vital signs taken at a resting state? Yes  Focused Assessment No change from prior assessment  Early Detection of Sepsis Score *See Row Information* Low  MEWS guidelines implemented *See Row Information* No, previously yellow, continue vital signs every 4 hours  Treat  MEWS Interventions Other (Comment) (continue to monitor vitals every 4 hours/pt AFIB)  Pain Scale Faces  Pain Score 0  Faces Pain Scale 0  Pain Intervention(s) Repositioned;Rest  Take Vital Signs  Increase Vital Sign Frequency   (vitals every 4 hours)  Escalate  MEWS: Escalate Yellow: discuss with charge nurse/RN and consider discussing with provider and RRT  Notify: Charge Nurse/RN  Name of Charge Nurse/RN Notified Heather, RN  Date Charge Nurse/RN Notified 04/16/20  Time Charge Nurse/RN Notified 2330  Document  Patient Outcome Other (Comment) (pt stable pt has AFIB heart rate staying over 100. will continue to monitor vital signs every 4 hours.)  Progress note created (see row info) Yes

## 2020-04-17 NOTE — Progress Notes (Signed)
   04/17/20 1652  Assess: MEWS Score  Temp 98.5 F (36.9 C)  BP 105/73  Pulse Rate (!) 123  ECG Heart Rate (!) 122  Resp (!) 26  SpO2 97 %  O2 Device Tracheostomy Collar  Assess: MEWS Score  MEWS Temp 0  MEWS Systolic 0  MEWS Pulse 2  MEWS RR 2  MEWS LOC 0  MEWS Score 4  MEWS Score Color Red  Assess: if the MEWS score is Yellow or Red  Were vital signs taken at a resting state? Yes  Focused Assessment No change from prior assessment  Early Detection of Sepsis Score *See Row Information* Low  MEWS guidelines implemented *See Row Information* Yes  Treat  MEWS Interventions Administered scheduled meds/treatments  Pain Scale 0-10  Pain Score 0  Take Vital Signs  Increase Vital Sign Frequency  Red: Q 1hr X 4 then Q 4hr X 4, if remains red, continue Q 4hrs  Escalate  MEWS: Escalate Red: discuss with charge nurse/RN and provider, consider discussing with RRT  Notify: Charge Nurse/RN  Name of Charge Nurse/RN Notified Abigail RN  Date Charge Nurse/RN Notified 04/17/20  Time Charge Nurse/RN Notified 1652  Notify: Provider  Provider Name/Title Tyrell Antonio, B MD  Date Provider Notified 04/17/20  Time Provider Notified 1751  Notification Type Page  Notification Reason Other (Comment) (MEWS protocol)  Document  Patient Outcome Other (Comment) (Remains stable at this time)  Progress note created (see row info) Yes

## 2020-04-17 NOTE — Progress Notes (Signed)
NAME:  Rebecca Harrell, MRN:  [id number], DOB:  [birth year], LOS: 2 ADMISSION DATE:  04/15/2020, CONSULTATION DATE:  04/16/2020 REFERRING MD:  Dr. Myna Hidalgo, CHIEF COMPLAINT:  04/16/2020  Brief History:  89 yoF with chronic respiratory failure s/p trach sent from [facility] for Afib with RVR.  Patient is also being treated for pneumonia on zosyn pta.  CXR concerning for possible partially loculated effusion.  Pulmonary consulted for further evaluation.   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  Found to be in Afib with RVR and initially hypotensive, which resolved after 2L NS bolus.  Patient afebrile and remains on ATC trial.   CTA without PE, notable for pulmonary edema with small bilateral pulmonary effusions and loculated pleural fluid accumulation within R minor fissure and  Oblique on the L.  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 Negative for acute pulmonary embolism. 2. Findings suggestive of pulmonary edema with small bilateral pleural  effusions. Loculated pleural fluid accumulation within the minor fissure on the right and within the oblique fissure on the left. 3. Patchy bibasilar airspace opacities may represent edema, atelectasis, or pneumonia. 4. Patient's percutaneous gastrostomy tube appears malpositioned although is incompletely visualized at the edge of the field of view. CT of the abdomen could be obtained to further evaluate. 5. Mildly prominent mediastinal lymph nodes, likely reactive. 6. Aortic and coronary artery atherosclerosis   3/13 CT abd/ pelvis >> 1. Percutaneous gastrostomy tube is appropriately positioned with balloon located intraluminally in the anterior aspect of the distal gastric body. No evidence of outlet obstruction. 2. No acute findings within the abdomen. 3. Small bilateral pleural effusions with patchy bibasilar opacities. Aortic Atherosclerosis   Micro Data:  3/13 BCx>> 3/13 SARS 2/ flu >> neg 3/13 UCx >> 100k Vanc resistant enterococcus 3/13 sputum cx >> 3/14 MRSA >> neg  Antimicrobials:  pta zosyn 3/13 azithro >> 3/13 cefepime >> 3/13 vanc >>   Interim History / Subjective:  Pt stable overnight, oxygen requirement decreased from yesterday to 5L 25% via ATC +2L since admission Urine culture growing VRE, do not see prior growth on cultures Remains in Afib with RVR  Objective   Blood pressure 110/81, pulse (!) 121, temperature 98.8 F (37.1 C), temperature source Axillary, resp. rate 20, height '5\' 7"'$  (1.702 m), weight 78.6 kg, SpO2 98 %.    FiO2 (%):  [28 %-35 %] 28 %   Intake/Output Summary (Last 24 hours) at 04/17/2020 0821 Last data filed at 04/17/2020 0436 Gross per 24 hour  Intake 403.31 ml  Output 500 ml  Net -96.69 ml   Filed Weights   04/15/20 1208 04/15/20 1304 04/16/20 0339  Weight: 77.1 kg 77.1 kg 78.6 kg   General:  Elderly, chronically ill-appearing  F awake and in no obvious distress HEENT: MM pink/moist, trach in place, white secretions Neuro: awake,  tracks with eyes, nods to questions slightly CV: s1s2 rrr, no m/r/g PULM:  On trach collar, no distress or accessory muscle use, rhonchi throughout GI: soft, PEG bsx4 active  Extremities: warm/dry, s/p R AKA, no edema  Skin: no rashes or lesions    Resolved Hospital Problem list    Assessment & Plan:   Acute onChronic hypoxic respiratory failure HCAP Loculated pleural effusion  From records, appears pt is usually on 20L trach collar, up to 35L on admission O2 requirement Improving post-admission P: -improving oxygen requirement and no growth on sputum culture -CXR with stable opacities and continued pulmonary edema, on Lasix '20mg'$  and remains 2L+, consider additional dose -CTA PE showing loculated pleural effusion- concern for empyema, reassuring that patient is afebrile with no lactic acidosis and low procalcitonin -no current indication for chest tube as effusion in the fissures -routine trach care -pulmonary toilet -Follow sputum cx -urine growing VRE and pulmonary status improving, would recommend ID consult for antibiotic management -cardiology following, continue Lasix '20mg'$  daily -continue duonebs and pulmicort  Atrial Fibrillation Worsening tachycardia the reason for initial EMS call from [facility], not improved with Metoprolol, not on anti-coagulation for unclear reasons P: -Eliquis initiated by cardiology -Remains in RVR today, diltiazem dose increased -consider Echo   Thank you for this consult, PCCM will continue to follow with you.  Best practice (evaluated daily)  Diet: per primary Pain/Anxiety/Delirium protocol (if indicated): n/a VAP protocol (if indicated): n/a DVT prophylaxis: Eliquis GI prophylaxis: n/a Glucose control: SSI Mobility: bed rest Disposition: progressive care  Goals of Care:  Last date of multidisciplinary goals of care discussion: per primary Family and staff present:  Summary of discussion:  Follow up goals of care discussion due:   Code Status: full code  Labs   CBC: Recent Labs  Lab 04/15/20 1253 04/16/20 0132 04/17/20 0231  WBC 6.9 8.0 6.4  NEUTROABS 4.9  --   --   HGB 8.1* 8.1* 7.9*  HCT 27.2* 26.9* 26.7*  MCV 95.4 94.1 96.0  PLT 225 242 [id number]    Basic Metabolic Panel: Recent Labs  Lab 04/15/20 1253 04/16/20 0132 04/17/20 0231  NA 135 138 139  K 4.5 3.6 3.1*  CL 97* 101 104  CO2 '27 26 24  '$ GLUCOSE 141* 149* 104*  BUN 23 23 27*  CREATININE 0.96 1.20* 1.42*  CALCIUM 8.8* 8.7* 8.4*   GFR: Estimated Creatinine Clearance: 35.8 mL/min (A) (by C-G formula based on SCr of 1.42 mg/dL (H)). Recent Labs  Lab 04/15/20 1253 04/16/20 0132 04/17/20 0231  PROCALCITON  --  <0.10 0.13  WBC 6.9 8.0 6.4  LATICACIDVEN 0.9  --   --     Liver Function Tests: Recent Labs  Lab 04/15/20 1253  AST 58*  ALT 25  ALKPHOS 75  BILITOT 1.1  PROT 8.0  ALBUMIN 2.3*   No results for input(s): LIPASE, AMYLASE in the last 168 hours. No results for input(s): AMMONIA in the last 168 hours.  ABG No results found for: PHART, PCO2ART, PO2ART, HCO3, TCO2, ACIDBASEDEF, O2SAT   Coagulation Profile: Recent Labs  Lab 04/15/20 1253  INR 1.2    Cardiac Enzymes: No results for input(s): CKTOTAL, CKMB, CKMBINDEX, TROPONINI in the last 168 hours.  HbA1C: Hgb A1c MFr Bld  Date/Time Value Ref Range Status  04/16/2020 01:32 AM 7.0 (H) 4.8 - 5.6 % Final    Comment:    (  NOTE) Pre diabetes:          5.7%-6.4%  Diabetes:              >6.4%  Glycemic control for   <7.0% adults with diabetes     CBG: Recent Labs  Lab 04/16/20 1615 04/16/20 2038 04/16/20 2350 04/17/20 0434 04/17/20 0757  GLUCAP 112* 107* 100* 98 113*    Review of Systems:   Unable to obtain secondary to mental status  Past Medical History:  She,  has a past medical history of Diabetes mellitus without complication ([place]), Dysrhythmia, History of CVA (cerebrovascular accident) (04/15/2020), Seizure disorder ([place]) (04/15/2020), and Stroke  ([place]).   Surgical History:   Past Surgical History:  Procedure Laterality Date  . BELOW KNEE LEG AMPUTATION    . PEG TUBE PLACEMENT    . TRACHEOSTOMY       Social History:   reports that she has never smoked. She has never used smokeless tobacco. She reports previous alcohol use.   Family History:  Her family history includes Diabetes type II in her maternal grandfather and maternal grandmother; Hypertension in her maternal grandfather and maternal grandmother; Macular degeneration in her maternal grandmother.   Allergies Allergies  Allergen Reactions  . Quinolones     UNK reaction     Home Medications  Prior to Admission medications   Not on File     Critical care time: 35 minutes    Otilio Carpen Lindzey Zent, PA-C [facility] See Amion for pager If no response to pager , please call [phone] until 7pm After 7:00 pm call Elink  [phone]?[place]

## 2020-04-17 NOTE — Plan of Care (Signed)
  Problem: Activity: Goal: Ability to tolerate increased activity will improve Outcome: Not Progressing   Problem: Activity: Goal: Risk for activity intolerance will decrease Outcome: Not Progressing

## 2020-04-17 NOTE — Progress Notes (Signed)
Came to see patient due to red mew score. , patient is in not distress, RR 20, fluctuates. Continue with respiratory toilet. For HR received Cardizem 30 minutes ago. Nurse will inform cardiology as well.

## 2020-04-17 NOTE — Progress Notes (Signed)
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 fib recently taken off of anticoagulation and currently under treatment for pneumonia at [facility] who presents for evaluation of persistent tachycardia despite IV Lopressor.  Patient required 20% FiO2 at the time of admission to LTAC 1 month ago, was a started on antibiotics 1 week ago for pneumonia and she is currently on IV Zosyn.  She reportedly became tachycardic and has been sustaining into the 120s and 150 range for the last 2 days despite IV Lopressor.  ED course: Patient was found to be afebrile saturating mid 90s on 8 L of oxygen supplementation, she was tachypneic in the mid 30s, tachycardic heart rate 130 blood pressure 86/45.  EKG showed A. fib RVR rate 140.  CTA chest was negative for PE but notable for pulmonary edema and a small bilateral pleural effusion as well as loculated effusion within the right minor fissure, patchy bibasilar airspace opacity.  CT abdomen was negative for acute finding and demonstrate percutaneous gastrostomy tube in appropriate position. -Started on Cardizem drip, cardiology  and CCM was consulted. She was transition to oral Cardizem. Cardiology adjusting medications.    Assessment & Plan:   Principal Problem:   Atrial fibrillation with rapid ventricular response (HCC) Active Problems:   History of CVA (cerebrovascular accident)   Normocytic anemia   Tracheostomy dependence (HCC)   Hypotension   Chronic pulmonary aspiration   Seizure disorder (HCC)   Acute on chronic respiratory failure with hypoxia (HCC)   Pneumonia   1-A. fib  with RVR: -Patient with history of PAF no longer on anticoagulation for unclear reason presented with persistent tachycardia despite IV Lopressor. -Patient received IV, she was a started Cardizem drip and cardiology was consulted. -Cardiology  recommended to start Eliquis 5 mg twice daily. -Plan to check 2D echo -she was transition to oral Cardizem. HR elevated, cardiology adjusting medications.   2-Acute on chronic hypoxic respiratory failure, secondary to pneumonia and or chronic heart failure preserved ejection fraction exacerbation: -History of aspiration, trach dependence, requiring 28% FiO2 on admission at [facility] now requiring 35%. -CTA negative for PE with concern for pulmonary edema, small loculated effusion and possible infection lower lung. -CCM following, no plan for thoracentesis at this point, plan to continue with IV cefepime, azithromycin for 5 days.  -Continue with trach care -Sputum culture ordered. -CCM think acute respiratory failure more consistent with pulmonary edema. Continue with oral lasix.   3-History of CVA: -Residual right hemiparesis and dysphagia, trach and PEG dependent -Starting Eliquis in the setting of A. fib, hold aspirin -PEG tube for nutrition.  Start tube feeding.   4-Seizure disorder: Continue with Keppra  5-Hypertension: Holding Norvasc and hydralazine, patient presented with hypotension  6-insulin-dependent diabetes: Continue with sliding scale insulin  7-Anemia: Monitor hemoglobin closely on Eliquis 8-UTI: UA with large leukocyte . urine growing more than 100,000 colonies of Enterococcus vancomycin resistant. Discussed with ID, plan to treat with linezolid for 3 days.    Estimated body mass index is 27.14 kg/m as calculated from the following:   Height as of this encounter: '5\' 7"'$  (1.702 m).   Weight as of this encounter: 78.6 kg.   DVT prophylaxis: Eliquis Code Status: Full code Family  Communication: Daughter over phone. 3-15 Disposition  Plan:  Status is: Inpatient  Remains inpatient appropriate because:IV treatments appropriate due to intensity of illness or inability to take PO   Dispo: The patient is from: LTAC              Anticipated d/c is to: LTAC              Patient currently is not medically stable to d/c.   Difficult to place patient No        Consultants:   Cardiology  CCM  Procedures:   Echo  Antimicrobials:  Cefepime, azithromycin and vancomycin  Subjective: Alert, eyes open.   Objective: Vitals:   04/17/20 0305 04/17/20 0307 04/17/20 0758 04/17/20 0847  BP: 128/60 128/60 110/81 110/81  Pulse: (!) 128 (!) 128 (!) 121 (!) 125  Resp: '20 20 20 '$ (!) 25  Temp: 98.6 F (37 C) 98.6 F (37 C) 98.8 F (37.1 C)   TempSrc: Axillary  Axillary   SpO2: 99%  98% 99%  Weight:      Height:        Intake/Output Summary (Last 24 hours) at 04/17/2020 1003 Last data filed at 04/17/2020 0436 Gross per 24 hour  Intake 403.31 ml  Output 500 ml  Net -96.69 ml   Filed Weights   04/15/20 1208 04/15/20 1304 04/16/20 0339  Weight: 77.1 kg 77.1 kg 78.6 kg    Examination:  General exam: Chronic ill appearing.  Respiratory system: Trach collar Cardiovascular system: S 1, S 2 IRR Gastrointestinal system: BS present, soft , nt PEG tube in place Central nervous system: alert , aphasic , right side hemiparesis and contraction.  Extremities: Muscle atrophy, contraction     Data Reviewed: I have personally reviewed following labs and imaging studies  CBC: Recent Labs  Lab 04/15/20 1253 04/16/20 0132 04/17/20 0231  WBC 6.9 8.0 6.4  NEUTROABS 4.9  --   --   HGB 8.1* 8.1* 7.9*  HCT 27.2* 26.9* 26.7*  MCV 95.4 94.1 96.0  PLT 225 242 [id number]   Basic Metabolic Panel: Recent Labs  Lab 04/15/20 1253 04/16/20 0132 04/17/20 0231  NA 135 138 139  K 4.5 3.6 3.1*  CL 97* 101 104  CO2 '27 26 24  '$ GLUCOSE 141* 149* 104*  BUN 23 23 27*  CREATININE 0.96 1.20* 1.42*  CALCIUM 8.8* 8.7* 8.4*    GFR: Estimated Creatinine Clearance: 35.8 mL/min (A) (by C-G formula based on SCr of 1.42 mg/dL (H)). Liver Function Tests: Recent Labs  Lab 04/15/20 1253  AST 58*  ALT 25  ALKPHOS 75  BILITOT 1.1  PROT 8.0  ALBUMIN 2.3*   No results for input(s): LIPASE, AMYLASE in the last 168 hours. No results for input(s): AMMONIA in the last 168 hours. Coagulation Profile: Recent Labs  Lab 04/15/20 1253  INR 1.2   Cardiac Enzymes: No results for input(s): CKTOTAL, CKMB, CKMBINDEX, TROPONINI in the last 168 hours. BNP (last 3 results) No results for input(s): PROBNP in the last 8760 hours. HbA1C: Recent Labs    04/16/20 0132  HGBA1C 7.0*   CBG: Recent Labs  Lab 04/16/20 1615 04/16/20 2038 04/16/20 2350 04/17/20 0434 04/17/20 0757  GLUCAP 112* 107* 100* 98 113*   Lipid Profile: No results for input(s): CHOL, HDL, LDLCALC, TRIG, CHOLHDL, LDLDIRECT in the last 72 hours. Thyroid Function Tests: No results for input(s): TSH, T4TOTAL, FREET4, T3FREE, THYROIDAB in the last 72 hours. Anemia Panel: No results for input(s): VITAMINB12, FOLATE, FERRITIN,  TIBC, IRON, RETICCTPCT in the last 72 hours. Sepsis Labs: Recent Labs  Lab 04/15/20 1253 04/16/20 0132 04/17/20 0231  PROCALCITON  --  <0.10 0.13  LATICACIDVEN 0.9  --   --     Recent Results (from the past 240 hour(s))  Blood culture (routine single)     Status: None (Preliminary result)   Collection Time: 04/15/20 12:53 PM   Specimen: BLOOD RIGHT FOREARM  Result Value Ref Range Status   Specimen Description BLOOD RIGHT FOREARM  Final   Special Requests   Final    BOTTLES DRAWN AEROBIC AND ANAEROBIC Blood Culture adequate volume   Culture   Final    NO GROWTH < 24 HOURS Performed at [facility], [place] [address] [place], [place] [address]    Report Status PENDING  Incomplete  Resp Panel by RT-PCR (Flu A&B, Covid) Nasopharyngeal Swab     Status: None   Collection Time: 04/15/20 12:55 PM   Specimen:  Nasopharyngeal Swab; Nasopharyngeal(NP) swabs in vial transport medium  Result Value Ref Range Status   SARS Coronavirus 2 by RT PCR NEGATIVE NEGATIVE Final    Comment: (NOTE) SARS-CoV-2 target nucleic acids are NOT DETECTED.  The SARS-CoV-2 RNA is generally detectable in upper respiratory specimens during the acute phase of infection. The lowest concentration of SARS-CoV-2 viral copies this assay can detect is 138 copies/mL. A negative result does not preclude SARS-Cov-2 infection and should not be used as the sole basis for treatment or other patient management decisions. A negative result may occur with  improper specimen collection/handling, submission of specimen other than nasopharyngeal swab, presence of viral mutation(s) within the areas targeted by this assay, and inadequate number of viral copies(<138 copies/mL). A negative result must be combined with clinical observations, patient history, and epidemiological information. The expected result is Negative.  Fact Sheet for Patients:  EntrepreneurPulse.com.au  Fact Sheet for Healthcare Providers:  IncredibleEmployment.be  This test is no t yet approved or cleared by the [place] FDA and  has been authorized for detection and/or diagnosis of SARS-CoV-2 by FDA under an Emergency Use Authorization (EUA). This EUA will remain  in effect (meaning this test can be used) for the duration of the COVID-19 declaration under Section 564(b)(1) of the Act, 21 U.S.C.section 360bbb-3(b)(1), unless the authorization is terminated  or revoked sooner.       Influenza A by PCR NEGATIVE NEGATIVE Final   Influenza B by PCR NEGATIVE NEGATIVE Final    Comment: (NOTE) The Xpert Xpress SARS-CoV-2/FLU/RSV plus assay is intended as an aid in the diagnosis of influenza from Nasopharyngeal swab specimens and should not be used as a sole basis for treatment. Nasal washings and aspirates are unacceptable for  Xpert Xpress SARS-CoV-2/FLU/RSV testing.  Fact Sheet for Patients: EntrepreneurPulse.com.au  Fact Sheet for Healthcare Providers: IncredibleEmployment.be  This test is not yet approved or cleared by the [place] FDA and has been authorized for detection and/or diagnosis of SARS-CoV-2 by FDA under an Emergency Use Authorization (EUA). This EUA will remain in effect (meaning this test can be used) for the duration of the COVID-19 declaration under Section 564(b)(1) of the Act, 21 U.S.C. section 360bbb-3(b)(1), unless the authorization is terminated or revoked.  Performed at [facility], [place] [address] [place], [place] [address]   Urine culture     Status: Abnormal   Collection Time: 04/15/20  3:24 PM   Specimen: In/Out Cath Urine  Result Value Ref Range Status   Specimen Description  IN/OUT CATH URINE  Final   Special Requests   Final    NONE Performed at [facility], [place] [address], [place], [place] [address]    Culture (A)  Final    >=100,000 COLONIES/mL VANCOMYCIN RESISTANT ENTEROCOCCUS ISOLATED   Report Status 04/17/2020 FINAL  Final   Organism ID, Bacteria VANCOMYCIN RESISTANT ENTEROCOCCUS ISOLATED (A)  Final      Susceptibility   Vancomycin resistant enterococcus isolated - MIC*    AMPICILLIN >=32 RESISTANT Resistant     NITROFURANTOIN 64 INTERMEDIATE Intermediate     VANCOMYCIN >=32 RESISTANT Resistant     LINEZOLID 2 SENSITIVE Sensitive     * >=100,000 COLONIES/mL VANCOMYCIN RESISTANT ENTEROCOCCUS ISOLATED  MRSA PCR Screening     Status: None   Collection Time: 04/16/20  4:02 AM   Specimen: Nasal Mucosa; Nasopharyngeal  Result Value Ref Range Status   MRSA by PCR NEGATIVE NEGATIVE Final    Comment:        The GeneXpert MRSA Assay (FDA approved for NASAL specimens only), is one component of a comprehensive MRSA colonization surveillance program. It is not intended to diagnose MRSA infection nor to guide  or monitor treatment for MRSA infections. Performed at [facility], [place] [address] [place], [place] [address]          Radiology Studies: CT ABDOMEN WO CONTRAST  Result Date: 04/15/2020 CLINICAL DATA:  Possible G-tube malpositioning EXAM: CT ABDOMEN WITHOUT CONTRAST TECHNIQUE: Multidetector CT imaging of the abdomen was performed following the standard protocol without IV contrast. COMPARISON:  CT chest same day FINDINGS: Lower chest: Cardiomegaly. Coronary artery calcifications. Small bilateral pleural effusions with patchy bibasilar opacities. Hepatobiliary: Unremarkable unenhanced appearance of the liver. No focal liver lesion identified. Gallbladder is decompressed. No hyperdense gallstone. No biliary dilatation. Pancreas: Unremarkable. No pancreatic ductal dilatation or surrounding inflammatory changes. Spleen: Grossly unremarkable. Adrenals/Urinary Tract: No definite adrenal nodule. Kidneys appear within normal limits. Excreted contrast within the bilateral renal collecting systems. No hydronephrosis. Stomach/Bowel: Percutaneous gastrostomy tube is appropriately positioned with balloon located in the anterior aspect of the distal gastric body. No evidence of outlet obstruction. Visualized bowel loops within the upper abdomen are within normal limits. No evidence of obstruction or inflammation. Vascular/Lymphatic: Extensive atherosclerosis throughout the aortoiliac axis. No upper abdominal lymphadenopathy. Other: No ascites or pneumoperitoneum. Musculoskeletal: No acute findings. IMPRESSION: 1. Percutaneous gastrostomy tube is appropriately positioned with balloon located intraluminally in the anterior aspect of the distal gastric body. No evidence of outlet obstruction. 2. No acute findings within the abdomen. 3. Small bilateral pleural effusions with patchy bibasilar opacities. Aortic Atherosclerosis (ICD10-I70.0). Electronically Signed   By: Davina Poke D.O.   On: 04/15/2020 16:16    CT Angio Chest PE W and/or Wo Contrast  Result Date: 04/15/2020 CLINICAL DATA:  Tachycardia EXAM: CT ANGIOGRAPHY CHEST WITH CONTRAST TECHNIQUE: Multidetector CT imaging of the chest was performed using the standard protocol during bolus administration of intravenous contrast. Multiplanar CT image reconstructions and MIPs were obtained to evaluate the vascular anatomy. CONTRAST:  39m OMNIPAQUE IOHEXOL 350 MG/ML SOLN COMPARISON:  04/15/2020 FINDINGS: Cardiovascular: Satisfactory opacification of the pulmonary arteries to the segmental level. No evidence of pulmonary embolism. Thoracic aorta is nonaneurysmal. Common origin of the brachiocephalic and left common carotid arteries. Tortuosity of the descending thoracic aorta. Atherosclerotic calcifications of the aorta and coronary arteries. Heart size is mildly enlarged. No pericardial effusion. Mediastinum/Nodes: Mildly prominent mediastinal lymph nodes including 12 mm precarinal node (series 5, image 37). No  enlarged axillary or hilar lymph nodes identified. Tracheostomy tube is present. Trachea and esophagus within normal limits. No significant findings within the thyroid gland. Lungs/Pleura: Small bilateral pleural effusions. Loculated pleural fluid accumulation within the minor fissure on the right and within the oblique fissure on the left. Patchy bibasilar airspace opacities. Diffuse interlobular septal thickening with ground-glass attenuation throughout both lung fields. No pneumothorax. Upper Abdomen: Although only partially visualized at the edge of the field of view. Patient's percutaneous gastrostomy tube appears malpositioned with balloon appearing extraluminal positioned anterior to the left hepatic lobe (series 5, image 97). Musculoskeletal: No acute osseous abnormality.  No chest wall mass. Review of the MIP images confirms the above findings. IMPRESSION: 1. Negative for acute pulmonary embolism. 2. Findings suggestive of pulmonary edema with small  bilateral pleural effusions. Loculated pleural fluid accumulation within the minor fissure on the right and within the oblique fissure on the left. 3. Patchy bibasilar airspace opacities may represent edema, atelectasis, or pneumonia. 4. Patient's percutaneous gastrostomy tube appears malpositioned although is incompletely visualized at the edge of the field of view. CT of the abdomen could be obtained to further evaluate. 5. Mildly prominent mediastinal lymph nodes, likely reactive. 6. Aortic and coronary artery atherosclerosis (ICD10-I70.0). Electronically Signed   By: Davina Poke D.O.   On: 04/15/2020 15:00   DG CHEST PORT 1 VIEW  Result Date: 04/17/2020 CLINICAL DATA:  Pneumonia. EXAM: PORTABLE CHEST 1 VIEW COMPARISON:  April 15, 2020. FINDINGS: Stable cardiomediastinal silhouette. Tracheostomy tube is in good position. No pneumothorax is noted. Small loculated left pleural effusion is noted. Stable bilateral lung opacities are noted concerning for pulmonary edema. Bony thorax is unremarkable. IMPRESSION: Stable bilateral lung opacities are noted concerning for pulmonary edema. Small loculated left pleural effusion is noted. Aortic Atherosclerosis (ICD10-I70.0). Electronically Signed   By: Marijo Conception M.D.   On: 04/17/2020 08:27   DG Chest Port 1 View  Result Date: 04/15/2020 CLINICAL DATA:  Tachycardia and wheezing EXAM: PORTABLE CHEST 1 VIEW COMPARISON:  November 06, 2010. FINDINGS: Tracheostomy catheter tip is 4.3 cm above the carina. No pneumothorax. There is apparent interstitial pulmonary edema with partially loculated small left pleural effusion. Ill-defined opacity noted in left base. There is cardiomegaly with pulmonary venous hypertension. No adenopathy. There is aortic atherosclerosis. No bone lesions. IMPRESSION: Tracheostomy as described without evident pneumothorax. Cardiomegaly with a degree of pulmonary vascular congestion. Evidence of interstitial pulmonary edema with small  loculated pleural effusion. The appearance is suspicious for a degree of underlying congestive heart failure. Focal airspace opacity in the left base is concerning for focus of pneumonia, likely superimposed on a degree of interstitial pulmonary edema. Aortic Atherosclerosis (ICD10-I70.0). Electronically Signed   By: Lowella Grip III M.D.   On: 04/15/2020 13:55   ECHOCARDIOGRAM COMPLETE  Result Date: 04/16/2020    ECHOCARDIOGRAM REPORT   Patient Name:   DESIRA LITTON Date of Exam: 04/16/2020 Medical Rec #:  [id number]   Height:       67.0 in Accession #:    [id number]  Weight:       173.3 lb Date of Birth:  August 08, 1940   BSA:          1.903 m Patient Age:    4 years    BP:           122/77 mmHg Patient Gender: F           HR:           72 bpm. Exam  Location:  Inpatient Procedure: 2D Echo, Cardiac Doppler and Color Doppler Indications:    I48.2 Chronic atrial fibrillation; I48.91* Unspeicified atrial                 fibrillation  History:        Patient has no prior history of Echocardiogram examinations.                 Stroke; Risk Factors:Diabetes.  Sonographer:    Bernadene Person RDCS Referring Phys: [id number] [facility] A CHANDRASEKHAR IMPRESSIONS  1. Left ventricular ejection fraction, by estimation, is 55 to 60%. The left ventricle has normal function. The left ventricle has no regional wall motion abnormalities. Left ventricular diastolic function could not be evaluated.  2. Right ventricular systolic function is normal. The right ventricular size is normal. There is moderately elevated pulmonary artery systolic pressure.  3. Left atrial size was mildly dilated.  4. The mitral valve is grossly normal. Mild to moderate mitral valve regurgitation.  5. Tricuspid valve regurgitation is moderate.  6. The aortic valve is calcified. There is mild calcification of the aortic valve. There is mild thickening of the aortic valve. Aortic valve regurgitation is mild.  7. Aortic dilatation noted. There is mild dilatation of  the ascending aorta, measuring 39 mm. FINDINGS  Left Ventricle: Left ventricular ejection fraction, by estimation, is 55 to 60%. The left ventricle has normal function. The left ventricle has no regional wall motion abnormalities. The left ventricular internal cavity size was normal in size. There is  no left ventricular hypertrophy. Left ventricular diastolic function could not be evaluated due to atrial fibrillation. Left ventricular diastolic function could not be evaluated. Right Ventricle: The right ventricular size is normal. No increase in right ventricular wall thickness. Right ventricular systolic function is normal. There is moderately elevated pulmonary artery systolic pressure. The tricuspid regurgitant velocity is 3.60 m/s, and with an assumed right atrial pressure of 8 mmHg, the estimated right ventricular systolic pressure is 123456 mmHg. Left Atrium: Left atrial size was mildly dilated. Right Atrium: Right atrial size was normal in size. Pericardium: There is no evidence of pericardial effusion. Mitral Valve: The mitral valve is grossly normal. There is moderate thickening of the mitral valve leaflet(s). There is mild calcification of the mitral valve leaflet(s). Mild to moderate mitral annular calcification. Mild to moderate mitral valve regurgitation. Tricuspid Valve: The tricuspid valve is normal in structure. Tricuspid valve regurgitation is moderate. Aortic Valve: The aortic valve is calcified. There is mild calcification of the aortic valve. There is mild thickening of the aortic valve. Aortic valve regurgitation is mild. Aortic regurgitation PHT measures 455 msec. Pulmonic Valve: The pulmonic valve was normal in structure. Pulmonic valve regurgitation is not visualized. Aorta: Aortic dilatation noted. There is mild dilatation of the ascending aorta, measuring 39 mm. IAS/Shunts: The atrial septum is grossly normal.  LEFT VENTRICLE PLAX 2D LVIDd:         5.10 cm LVIDs:         3.90 cm LV PW:          0.90 cm LV IVS:        1.00 cm LVOT diam:     2.00 cm LV SV:         49 LV SV Index:   26 LVOT Area:     3.14 cm  LV Volumes (MOD) LV vol d, MOD A2C: 96.4 ml LV vol d, MOD A4C: 84.5 ml LV vol s, MOD A2C: 39.2 ml LV  vol s, MOD A4C: 40.4 ml LV SV MOD A2C:     57.2 ml LV SV MOD A4C:     84.5 ml LV SV MOD BP:      52.9 ml RIGHT VENTRICLE RV S prime:     6.94 cm/s TAPSE (M-mode): 1.1 cm LEFT ATRIUM             Index       RIGHT ATRIUM           Index LA diam:        3.70 cm 1.94 cm/m  RA Area:     17.00 cm LA Vol (A2C):   52.0 ml 27.33 ml/m RA Volume:   46.10 ml  24.23 ml/m LA Vol (A4C):   68.7 ml 36.11 ml/m LA Biplane Vol: 51.3 ml 26.96 ml/m  AORTIC VALVE LVOT Vmax:   93.38 cm/s LVOT Vmean:  63.350 cm/s LVOT VTI:    0.156 m AI PHT:      455 msec  AORTA Ao Root diam: 3.30 cm Ao Asc diam:  3.90 cm MR Peak grad:    89.1 mmHg   TRICUSPID VALVE MR Mean grad:    61.0 mmHg   TR Peak grad:   51.8 mmHg MR Vmax:         472.00 cm/s TR Vmax:        360.00 cm/s MR Vmean:        370.0 cm/s MR PISA:         0.57 cm    SHUNTS MR PISA Eff ROA: 5 mm       Systemic VTI:  0.16 m MR PISA Radius:  0.30 cm     Systemic Diam: 2.00 cm Mertie Moores MD Electronically signed by Mertie Moores MD Signature Date/Time: 04/16/2020/4:09:55 PM    Final         Scheduled Meds: . apixaban  5 mg Per Tube BID  . budesonide (PULMICORT) nebulizer solution  0.25 mg Nebulization BID  . chlorhexidine  15 mL Mouth Rinse BID  . Chlorhexidine Gluconate Cloth  6 each Topical Daily  . diltiazem  60 mg Per Tube Q6H  . furosemide  20 mg Per Tube Daily  . insulin aspart  0-6 Units Subcutaneous Q4H  . ipratropium-albuterol  3 mL Nebulization TID  . levETIRAcetam  750 mg Per Tube BID  . mouth rinse  15 mL Mouth Rinse q12n4p  . pantoprazole (PROTONIX) IV  40 mg Intravenous Daily  . sodium chloride flush  10-40 mL Intracatheter Q12H   Continuous Infusions: . azithromycin Stopped (04/17/20 0057)  . ceFEPime (MAXIPIME) IV Stopped (04/16/20  2354)  . linezolid (ZYVOX) IV    . potassium chloride 10 mEq (04/17/20 0844)     LOS: 2 days    Time spent: 35 minutes.     Elmarie Shiley, MD [facility]   If 7PM-7AM, please contact night-coverage [email or web address]  04/17/2020, 10:03 AM

## 2020-04-17 NOTE — Progress Notes (Addendum)
Progress Note  Patient Name: Rebecca Harrell Date of Encounter: 04/17/2020  Primary Cardiologist: New to [facility]  Subjective   Rates uncontrolled today in the 120's. Will adjust meds and follow, Nods appropriately to questioning   Inpatient Medications    Scheduled Meds: . apixaban  5 mg Per Tube BID  . budesonide (PULMICORT) nebulizer solution  0.25 mg Nebulization BID  . chlorhexidine  15 mL Mouth Rinse BID  . Chlorhexidine Gluconate Cloth  6 each Topical Daily  . diltiazem  30 mg Per Tube Q6H  . furosemide  20 mg Per Tube Daily  . insulin aspart  0-6 Units Subcutaneous Q4H  . ipratropium-albuterol  3 mL Nebulization TID  . levETIRAcetam  750 mg Per Tube BID  . mouth rinse  15 mL Mouth Rinse q12n4p  . pantoprazole (PROTONIX) IV  40 mg Intravenous Daily  . sodium chloride flush  10-40 mL Intracatheter Q12H   Continuous Infusions: . azithromycin Stopped (04/17/20 0057)  . ceFEPime (MAXIPIME) IV Stopped (04/16/20 2354)  . diltiazem (CARDIZEM) infusion Stopped (04/16/20 1615)  . vancomycin 1,250 mg (04/16/20 1401)   PRN Meds: acetaminophen, morphine injection, ondansetron (ZOFRAN) IV, sodium chloride flush, sodium chloride HYPERTONIC   Vital Signs    Vitals:   04/16/20 2337 04/16/20 2352 04/17/20 0305 04/17/20 0307  BP:  131/62 128/60 128/60  Pulse: (!) 130 (!) 128 (!) 128 (!) 128  Resp: '19 14 20 20  '$ Temp:   98.6 F (37 C) 98.6 F (37 C)  TempSrc:   Axillary   SpO2: 98% 99% 99%   Weight:      Height:        Intake/Output Summary (Last 24 hours) at 04/17/2020 0733 Last data filed at 04/17/2020 0436 Gross per 24 hour  Intake 403.31 ml  Output 500 ml  Net -96.69 ml   Filed Weights   04/15/20 1208 04/15/20 1304 04/16/20 0339  Weight: 77.1 kg 77.1 kg 78.6 kg    Physical Exam   General: Elderly, NAD Lungs:Clear to ausculation bilaterally. Cardiovascular: Irregularly irregular. No murmurs Abdomen: Soft, non-tender, non-distended. No obvious abdominal  masses. Extremities: No edema. Radial pulses 2+ bilaterally Neuro: Nods appropriately to questioning   Labs    Chemistry Recent Labs  Lab 04/15/20 1253 04/16/20 0132 04/17/20 0231  NA 135 138 139  K 4.5 3.6 3.1*  CL 97* 101 104  CO2 '27 26 24  '$ GLUCOSE 141* 149* 104*  BUN 23 23 27*  CREATININE 0.96 1.20* 1.42*  CALCIUM 8.8* 8.7* 8.4*  PROT 8.0  --   --   ALBUMIN 2.3*  --   --   AST 58*  --   --   ALT 25  --   --   ALKPHOS 75  --   --   BILITOT 1.1  --   --   GFRNONAA >60 47* 38*  ANIONGAP '11 11 11     '$ Hematology Recent Labs  Lab 04/15/20 1253 04/16/20 0132 04/17/20 0231  WBC 6.9 8.0 6.4  RBC 2.85* 2.86* 2.78*  HGB 8.1* 8.1* 7.9*  HCT 27.2* 26.9* 26.7*  MCV 95.4 94.1 96.0  MCH 28.4 28.3 28.4  MCHC 29.8* 30.1 29.6*  RDW 15.9* 16.0* 16.1*  PLT 225 242 221    Cardiac EnzymesNo results for input(s): TROPONINI in the last 168 hours. No results for input(s): TROPIPOC in the last 168 hours.   BNPNo results for input(s): BNP, PROBNP in the last 168 hours.   DDimer No results  for input(s): DDIMER in the last 168 hours.   Radiology    CT ABDOMEN WO CONTRAST  Result Date: 04/15/2020 CLINICAL DATA:  Possible G-tube malpositioning EXAM: CT ABDOMEN WITHOUT CONTRAST TECHNIQUE: Multidetector CT imaging of the abdomen was performed following the standard protocol without IV contrast. COMPARISON:  CT chest same day FINDINGS: Lower chest: Cardiomegaly. Coronary artery calcifications. Small bilateral pleural effusions with patchy bibasilar opacities. Hepatobiliary: Unremarkable unenhanced appearance of the liver. No focal liver lesion identified. Gallbladder is decompressed. No hyperdense gallstone. No biliary dilatation. Pancreas: Unremarkable. No pancreatic ductal dilatation or surrounding inflammatory changes. Spleen: Grossly unremarkable. Adrenals/Urinary Tract: No definite adrenal nodule. Kidneys appear within normal limits. Excreted contrast within the bilateral renal  collecting systems. No hydronephrosis. Stomach/Bowel: Percutaneous gastrostomy tube is appropriately positioned with balloon located in the anterior aspect of the distal gastric body. No evidence of outlet obstruction. Visualized bowel loops within the upper abdomen are within normal limits. No evidence of obstruction or inflammation. Vascular/Lymphatic: Extensive atherosclerosis throughout the aortoiliac axis. No upper abdominal lymphadenopathy. Other: No ascites or pneumoperitoneum. Musculoskeletal: No acute findings. IMPRESSION: 1. Percutaneous gastrostomy tube is appropriately positioned with balloon located intraluminally in the anterior aspect of the distal gastric body. No evidence of outlet obstruction. 2. No acute findings within the abdomen. 3. Small bilateral pleural effusions with patchy bibasilar opacities. Aortic Atherosclerosis (ICD10-I70.0). Electronically Signed   By: Davina Poke D.O.   On: 04/15/2020 16:16   CT Angio Chest PE W and/or Wo Contrast  Result Date: 04/15/2020 CLINICAL DATA:  Tachycardia EXAM: CT ANGIOGRAPHY CHEST WITH CONTRAST TECHNIQUE: Multidetector CT imaging of the chest was performed using the standard protocol during bolus administration of intravenous contrast. Multiplanar CT image reconstructions and MIPs were obtained to evaluate the vascular anatomy. CONTRAST:  33m OMNIPAQUE IOHEXOL 350 MG/ML SOLN COMPARISON:  04/15/2020 FINDINGS: Cardiovascular: Satisfactory opacification of the pulmonary arteries to the segmental level. No evidence of pulmonary embolism. Thoracic aorta is nonaneurysmal. Common origin of the brachiocephalic and left common carotid arteries. Tortuosity of the descending thoracic aorta. Atherosclerotic calcifications of the aorta and coronary arteries. Heart size is mildly enlarged. No pericardial effusion. Mediastinum/Nodes: Mildly prominent mediastinal lymph nodes including 12 mm precarinal node (series 5, image 37). No enlarged axillary or hilar  lymph nodes identified. Tracheostomy tube is present. Trachea and esophagus within normal limits. No significant findings within the thyroid gland. Lungs/Pleura: Small bilateral pleural effusions. Loculated pleural fluid accumulation within the minor fissure on the right and within the oblique fissure on the left. Patchy bibasilar airspace opacities. Diffuse interlobular septal thickening with ground-glass attenuation throughout both lung fields. No pneumothorax. Upper Abdomen: Although only partially visualized at the edge of the field of view. Patient's percutaneous gastrostomy tube appears malpositioned with balloon appearing extraluminal positioned anterior to the left hepatic lobe (series 5, image 97). Musculoskeletal: No acute osseous abnormality.  No chest wall mass. Review of the MIP images confirms the above findings. IMPRESSION: 1. Negative for acute pulmonary embolism. 2. Findings suggestive of pulmonary edema with small bilateral pleural effusions. Loculated pleural fluid accumulation within the minor fissure on the right and within the oblique fissure on the left. 3. Patchy bibasilar airspace opacities may represent edema, atelectasis, or pneumonia. 4. Patient's percutaneous gastrostomy tube appears malpositioned although is incompletely visualized at the edge of the field of view. CT of the abdomen could be obtained to further evaluate. 5. Mildly prominent mediastinal lymph nodes, likely reactive. 6. Aortic and coronary artery atherosclerosis (ICD10-I70.0). Electronically Signed   By: NHart Carwin  Plundo D.O.   On: 04/15/2020 15:00   DG Chest Port 1 View  Result Date: 04/15/2020 CLINICAL DATA:  Tachycardia and wheezing EXAM: PORTABLE CHEST 1 VIEW COMPARISON:  November 06, 2010. FINDINGS: Tracheostomy catheter tip is 4.3 cm above the carina. No pneumothorax. There is apparent interstitial pulmonary edema with partially loculated small left pleural effusion. Ill-defined opacity noted in left base. There  is cardiomegaly with pulmonary venous hypertension. No adenopathy. There is aortic atherosclerosis. No bone lesions. IMPRESSION: Tracheostomy as described without evident pneumothorax. Cardiomegaly with a degree of pulmonary vascular congestion. Evidence of interstitial pulmonary edema with small loculated pleural effusion. The appearance is suspicious for a degree of underlying congestive heart failure. Focal airspace opacity in the left base is concerning for focus of pneumonia, likely superimposed on a degree of interstitial pulmonary edema. Aortic Atherosclerosis (ICD10-I70.0). Electronically Signed   By: Lowella Grip III M.D.   On: 04/15/2020 13:55   ECHOCARDIOGRAM COMPLETE  Result Date: 04/16/2020    ECHOCARDIOGRAM REPORT   Patient Name:   Rebecca Harrell Date of Exam: 04/16/2020 Medical Rec #:  [id number]   Height:       67.0 in Accession #:    [id number]  Weight:       173.3 lb Date of Birth:  1940-06-12   BSA:          1.903 m Patient Age:    44 years    BP:           122/77 mmHg Patient Gender: F           HR:           72 bpm. Exam Location:  Inpatient Procedure: 2D Echo, Cardiac Doppler and Color Doppler Indications:    I48.2 Chronic atrial fibrillation; I48.91* Unspeicified atrial                 fibrillation  History:        Patient has no prior history of Echocardiogram examinations.                 Stroke; Risk Factors:Diabetes.  Sonographer:    Bernadene Person RDCS Referring Phys: [id number] [facility] A Reeva Davern IMPRESSIONS  1. Left ventricular ejection fraction, by estimation, is 55 to 60%. The left ventricle has normal function. The left ventricle has no regional wall motion abnormalities. Left ventricular diastolic function could not be evaluated.  2. Right ventricular systolic function is normal. The right ventricular size is normal. There is moderately elevated pulmonary artery systolic pressure.  3. Left atrial size was mildly dilated.  4. The mitral valve is grossly normal. Mild to moderate  mitral valve regurgitation.  5. Tricuspid valve regurgitation is moderate.  6. The aortic valve is calcified. There is mild calcification of the aortic valve. There is mild thickening of the aortic valve. Aortic valve regurgitation is mild.  7. Aortic dilatation noted. There is mild dilatation of the ascending aorta, measuring 39 mm. FINDINGS  Left Ventricle: Left ventricular ejection fraction, by estimation, is 55 to 60%. The left ventricle has normal function. The left ventricle has no regional wall motion abnormalities. The left ventricular internal cavity size was normal in size. There is  no left ventricular hypertrophy. Left ventricular diastolic function could not be evaluated due to atrial fibrillation. Left ventricular diastolic function could not be evaluated. Right Ventricle: The right ventricular size is normal. No increase in right ventricular wall thickness. Right ventricular systolic function is normal. There is moderately elevated pulmonary artery systolic  pressure. The tricuspid regurgitant velocity is 3.60 m/s, and with an assumed right atrial pressure of 8 mmHg, the estimated right ventricular systolic pressure is 123456 mmHg. Left Atrium: Left atrial size was mildly dilated. Right Atrium: Right atrial size was normal in size. Pericardium: There is no evidence of pericardial effusion. Mitral Valve: The mitral valve is grossly normal. There is moderate thickening of the mitral valve leaflet(s). There is mild calcification of the mitral valve leaflet(s). Mild to moderate mitral annular calcification. Mild to moderate mitral valve regurgitation. Tricuspid Valve: The tricuspid valve is normal in structure. Tricuspid valve regurgitation is moderate. Aortic Valve: The aortic valve is calcified. There is mild calcification of the aortic valve. There is mild thickening of the aortic valve. Aortic valve regurgitation is mild. Aortic regurgitation PHT measures 455 msec. Pulmonic Valve: The pulmonic valve was  normal in structure. Pulmonic valve regurgitation is not visualized. Aorta: Aortic dilatation noted. There is mild dilatation of the ascending aorta, measuring 39 mm. IAS/Shunts: The atrial septum is grossly normal.  LEFT VENTRICLE PLAX 2D LVIDd:         5.10 cm LVIDs:         3.90 cm LV PW:         0.90 cm LV IVS:        1.00 cm LVOT diam:     2.00 cm LV SV:         49 LV SV Index:   26 LVOT Area:     3.14 cm  LV Volumes (MOD) LV vol d, MOD A2C: 96.4 ml LV vol d, MOD A4C: 84.5 ml LV vol s, MOD A2C: 39.2 ml LV vol s, MOD A4C: 40.4 ml LV SV MOD A2C:     57.2 ml LV SV MOD A4C:     84.5 ml LV SV MOD BP:      52.9 ml RIGHT VENTRICLE RV S prime:     6.94 cm/s TAPSE (M-mode): 1.1 cm LEFT ATRIUM             Index       RIGHT ATRIUM           Index LA diam:        3.70 cm 1.94 cm/m  RA Area:     17.00 cm LA Vol (A2C):   52.0 ml 27.33 ml/m RA Volume:   46.10 ml  24.23 ml/m LA Vol (A4C):   68.7 ml 36.11 ml/m LA Biplane Vol: 51.3 ml 26.96 ml/m  AORTIC VALVE LVOT Vmax:   93.38 cm/s LVOT Vmean:  63.350 cm/s LVOT VTI:    0.156 m AI PHT:      455 msec  AORTA Ao Root diam: 3.30 cm Ao Asc diam:  3.90 cm MR Peak grad:    89.1 mmHg   TRICUSPID VALVE MR Mean grad:    61.0 mmHg   TR Peak grad:   51.8 mmHg MR Vmax:         472.00 cm/s TR Vmax:        360.00 cm/s MR Vmean:        370.0 cm/s MR PISA:         0.57 cm    SHUNTS MR PISA Eff ROA: 5 mm       Systemic VTI:  0.16 m MR PISA Radius:  0.30 cm     Systemic Diam: 2.00 cm Mertie Moores MD Electronically signed by Mertie Moores MD Signature Date/Time: 04/16/2020/4:09:55 PM    Final  Telemetry    04/17/20 AF with rates in the 120 range - Personally Reviewed  ECG    No new tracing as of 04/17/20 - Personally Reviewed  Cardiac Studies   Echocardiogram:   Result date: 12/07/08 No evidence of intra-cardiac shunting by agitated IV saline contrast  No evidence of vegetation.  Mild concentric left ventricular hypertrophy is observed.  Global left ventricular wall  motion and contractility are within normal  limits.  The ejection fraction is estimated to be 65%.  Evidence of mild mitral annular calcification.  There is mild dilatation of the ascending aorta.  Rhythm AF   Patient Profile     80 y.o. female with a hx of PAF, DM2, HTN, seizures, HFpEF, CVA with residual R sided deficits, GERD and trach who presented from [facility] 2/2 ongoing AF/RVRsince Friday.  Assessment & Plan    1. Atrial fibrillation with RVR: -Hx of PAF dating back to 2010 not on anticoagulation for unclear reasons per record review who presented to [facility] from [facility] with persistent AF with rates in the 125-150 range. She has recently been treated for PNA which appears to be chronic due to aspiration PNA resulting in tracheostomy placement and transition to [facility]. She was given IV metoprolol with no improvement -Continue Eliquis given elevated CHA2DS2VASc 7 (age x2, CVA x2, DM, HTN, gender) -Transitioned to diltiazem '30mg'$  Q6H PT yesterday however will up titrate today given poor rate control  -Echo performed 04/16/20 with LVEF at 55-60%, no RWMA, mod elevated PA pressures, mild to mod MR, mild AR and aortic dilatation at 10m    2. 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 HTN: -Hypotensive on admission which responded to IVF  -BPs>>110/81>>>128/60>>131/62  5. DM2: -SSI for glucose control -Per IM   Signed, JKathyrn DrownNP-C [facility] Pager: [phone]/15/2022, 7:33 AM    Personally seen and examined. Agree with APP above.  Will increase diltiazem for rate control. MRudean Haskell [id number] [place] [place] [phone] 12:11 PM    For questions or updates, please contact    Please consult [email or web address] for contact info under Cardiology/STEMI.

## 2020-04-18 ENCOUNTER — Inpatient Hospital Stay (HOSPITAL_COMMUNITY): Payer: Medicare Other

## 2020-04-18 DIAGNOSIS — I152 Hypertension secondary to endocrine disorders: Secondary | ICD-10-CM | POA: Diagnosis not present

## 2020-04-18 DIAGNOSIS — I4891 Unspecified atrial fibrillation: Secondary | ICD-10-CM | POA: Diagnosis not present

## 2020-04-18 DIAGNOSIS — E1159 Type 2 diabetes mellitus with other circulatory complications: Secondary | ICD-10-CM | POA: Diagnosis not present

## 2020-04-18 LAB — CBC
HCT: 25.1 % — ABNORMAL LOW (ref 36.0–46.0)
Hemoglobin: 7.4 g/dL — ABNORMAL LOW (ref 12.0–15.0)
MCH: 27.9 pg (ref 26.0–34.0)
MCHC: 29.5 g/dL — ABNORMAL LOW (ref 30.0–36.0)
MCV: 94.7 fL (ref 80.0–100.0)
Platelets: 234 10*3/uL (ref 150–400)
RBC: 2.65 MIL/uL — ABNORMAL LOW (ref 3.87–5.11)
RDW: 16.3 % — ABNORMAL HIGH (ref 11.5–15.5)
WBC: 7.7 10*3/uL (ref 4.0–10.5)
nRBC: 0 % (ref 0.0–0.2)

## 2020-04-18 LAB — RETICULOCYTES
Immature Retic Fract: 32.8 % — ABNORMAL HIGH (ref 2.3–15.9)
RBC.: 2.64 MIL/uL — ABNORMAL LOW (ref 3.87–5.11)
Retic Count, Absolute: 109.8 10*3/uL (ref 19.0–186.0)
Retic Ct Pct: 4.2 % — ABNORMAL HIGH (ref 0.4–3.1)

## 2020-04-18 LAB — GLUCOSE, CAPILLARY
Glucose-Capillary: 220 mg/dL — ABNORMAL HIGH (ref 70–99)
Glucose-Capillary: 241 mg/dL — ABNORMAL HIGH (ref 70–99)
Glucose-Capillary: 266 mg/dL — ABNORMAL HIGH (ref 70–99)
Glucose-Capillary: 310 mg/dL — ABNORMAL HIGH (ref 70–99)
Glucose-Capillary: 356 mg/dL — ABNORMAL HIGH (ref 70–99)
Glucose-Capillary: 368 mg/dL — ABNORMAL HIGH (ref 70–99)

## 2020-04-18 LAB — BASIC METABOLIC PANEL
Anion gap: 10 (ref 5–15)
BUN: 37 mg/dL — ABNORMAL HIGH (ref 8–23)
CO2: 24 mmol/L (ref 22–32)
Calcium: 8.2 mg/dL — ABNORMAL LOW (ref 8.9–10.3)
Chloride: 103 mmol/L (ref 98–111)
Creatinine, Ser: 2.21 mg/dL — ABNORMAL HIGH (ref 0.44–1.00)
GFR, Estimated: 22 mL/min — ABNORMAL LOW (ref >60)
Glucose, Bld: 240 mg/dL — ABNORMAL HIGH (ref 70–99)
Potassium: 3.6 mmol/L (ref 3.5–5.1)
Sodium: 137 mmol/L (ref 135–145)

## 2020-04-18 LAB — FERRITIN: Ferritin: 245 ng/mL (ref 11–307)

## 2020-04-18 LAB — FOLATE: Folate: 23.3 ng/mL (ref >5.9)

## 2020-04-18 LAB — IRON AND TIBC
Iron: 56 ug/dL (ref 28–170)
Saturation Ratios: 19 % (ref 10.4–31.8)
TIBC: 298 ug/dL (ref 250–450)
UIBC: 242 ug/dL

## 2020-04-18 LAB — CK: Total CK: 28 U/L — ABNORMAL LOW (ref 38–234)

## 2020-04-18 LAB — VITAMIN B12: Vitamin B-12: 558 pg/mL (ref 180–914)

## 2020-04-18 MED ORDER — IPRATROPIUM-ALBUTEROL 0.5-2.5 (3) MG/3ML IN SOLN
3.0000 mL | Freq: Three times a day (TID) | RESPIRATORY_TRACT | Status: DC
  Administered 2020-04-18 – 2020-04-20 (×6): 3 mL via RESPIRATORY_TRACT
  Filled 2020-04-18 (×6): qty 3

## 2020-04-18 MED ORDER — SODIUM CHLORIDE 0.9 % IV SOLN
2.0000 g | INTRAVENOUS | Status: AC
  Administered 2020-04-19 – 2020-04-20 (×2): 2 g via INTRAVENOUS
  Filled 2020-04-18 (×2): qty 2

## 2020-04-18 MED ORDER — PROSOURCE TF PO LIQD
45.0000 mL | Freq: Every day | ORAL | Status: DC
  Administered 2020-04-18 – 2020-05-03 (×15): 45 mL
  Filled 2020-04-18 (×16): qty 45

## 2020-04-18 MED ORDER — IPRATROPIUM-ALBUTEROL 0.5-2.5 (3) MG/3ML IN SOLN: 3.0000 mL | Freq: Four times a day (QID) | RESPIRATORY_TRACT | Status: DC | PRN

## 2020-04-18 MED ORDER — DILTIAZEM HCL 60 MG PO TABS
90.0000 mg | ORAL_TABLET | Freq: Four times a day (QID) | ORAL | Status: DC
  Administered 2020-04-18: 90 mg
  Filled 2020-04-18: qty 2

## 2020-04-18 MED ORDER — INSULIN DETEMIR 100 UNIT/ML ~~LOC~~ SOLN
8.0000 [IU] | Freq: Two times a day (BID) | SUBCUTANEOUS | Status: DC
  Administered 2020-04-18 – 2020-04-19 (×3): 8 [IU] via SUBCUTANEOUS
  Filled 2020-04-18 (×4): qty 0.08

## 2020-04-18 MED ORDER — DILTIAZEM HCL 60 MG PO TABS
120.0000 mg | ORAL_TABLET | Freq: Four times a day (QID) | ORAL | Status: DC
  Administered 2020-04-18 – 2020-04-19 (×3): 120 mg
  Filled 2020-04-18 (×3): qty 2

## 2020-04-18 MED ORDER — DILTIAZEM HCL 60 MG PO TABS
30.0000 mg | ORAL_TABLET | Freq: Once | ORAL | Status: AC
  Administered 2020-04-18: 30 mg via ORAL
  Filled 2020-04-18: qty 1

## 2020-04-18 MED ORDER — SODIUM CHLORIDE 0.9 % IV SOLN: 2.0000 g | INTRAVENOUS | Status: DC

## 2020-04-18 NOTE — Progress Notes (Signed)
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 Will increase cardizem 120 mg q6hr - will give additional 30 mg cardizem.   Next steps could be trial of metoprolol +/- amiodarone.    Tami Lin Iver Miklas, PA-C 04/18/2020, 2:40 PM

## 2020-04-18 NOTE — Progress Notes (Addendum)
PROGRESS NOTE    Rebecca Harrell  [id number] DOB: January 28, 1941 DOA: 04/15/2020 PCP: Aaron Edelman, MD   Brief Narrative: 80/F conically ill with history of CVA, right hemiplegia, right AKA, seizure disorder, chronic respiratory failure status post trach and PEG, paroxysmal atrial fibrillation recently taken off anticoagulation and currently undergoing treatment for pneumonia at SNF presented to the ED from [facility] with rapid atrial fibrillation -ED course: Noted to be afebrile, sats in mid 90s on 8 L of oxygen supplementation, she was tachypneic in the mid 30s, tachycardic heart rate 130 blood pressure 86/45.  EKG showed A. fib RVR rate 140.  CTA chest was negative for PE but notable for pulmonary edema and a small bilateral pleural effusion as well as loculated effusion within the right minor fissure, patchy bibasilar airspace opacity.  CT abdomen was negative for acute finding and demonstrate percutaneous gastrostomy tube in appropriate position. -Started on Cardizem drip -PCCM and cardiology following  Assessment & Plan:   A. fib with RVR: -Patient with history of PAF no longer on anticoagulation for unclear reason presented with persistent rapid [place] cardiology input, currently on p.o. Cardizem, heart rate remains elevated  -Also started on Eliquis  Acute on chronic hypoxic respiratory failure, secondary to pneumonia and or chronic diastolic CHF Chronic tracheostomy -CTA negative for PE with concern for pulmonary edema, small loculated effusion and possible infection lower lung. -Appreciate PCCM input, suspected to be fluid related and less likely empyema  -Continue empiric cefepime and azithromycin for 5 days  -Diuretics on hold in the setting of AKI  -Repeat CT chest in a few days if stable  Acute kidney injury -Creatinine has trended up from 0.9 up to 2.2 today, could be cardiorenal from poor perfusion, hold diuretics today -Bladder scan without  retention -Check renal ultrasound and CK  History of CVA: -Residual right hemiparesis and dysphagia, trach and PEG dependent -Restarted Eliquis in the setting of A. fib, hold aspirin -PEG tube for nutrition, continue tube feeds  Seizure disorder:  -Continue Keppra  Hypertension:  -Blood pressures have been soft, holding Norvasc and hydralazine  Insulin-dependent diabetes:  -Continue sliding scale insulin  Acute on chronic anemia  -Check anemia panel, no overt bleeding noted  -Monitor hemoglobin while on Eliquis   VRE UTI  -UA with large leukocyte . urine growing more than 100,000 colonies of Enterococcus vancomycin resistant.  Dr. Tyrell Antonio discussed case with ID, plan to treat with linezolid for 3 days.    Estimated body mass index is 27.11 kg/m as calculated from the following:   Height as of this encounter: '5\' 7"'$  (1.702 m).   Weight as of this encounter: 78.5 kg.   DVT prophylaxis: Eliquis Code Status: Full code Family Communication: no family at bedside, will update daughter Disposition Plan:  Status is: Inpatient  Remains inpatient appropriate because:IV treatments appropriate due to intensity of illness or inability to take PO   Dispo: The patient is from: LTAC              Anticipated d/c is to: LTAC              Patient currently is not medically stable to d/c.   Difficult to place patient No   Consultants:   Cardiology  CCM  Procedures:   Echo  Antimicrobials:  Cefepime, azithromycin and vancomycin  Subjective: -Remains in rapid A. fib, tolerating tube feeds  Objective: Vitals:   04/18/20 0840 04/18/20 1100 04/18/20 1140 04/18/20 1332  BP:  127/81 127/81   Pulse: (!) 114 (!) 117 (!) 110   Resp: (!) 25 (!) 22 (!) 29   Temp:  98.4 F (36.9 C)    TempSrc:  Axillary    SpO2: 95% 100% 95% 93%  Weight:      Height:        Intake/Output Summary (Last 24 hours) at 04/18/2020 1419 Last data filed at 04/17/2020 1614 Gross per 24 hour  Intake  783.38 ml  Output -  Net 783.38 ml   Filed Weights   04/15/20 1304 04/16/20 0339 04/18/20 0300  Weight: 77.1 kg 78.6 kg 78.5 kg    Examination:  General exam: Chronically ill elderly female sitting up in bed, awake and alert, HEENT: Tracheostomy with trach collar CVS: S1-S2, irregularly irregular rhythm Lungs with decreased breath sounds the bases, few scattered rhonchi Abdomen: Soft, nontender, bowel sounds present Extremities: Trace edema Neuro: Right hemiplegia, right arm is contracted,  above-knee amputation   Data Reviewed: I have personally reviewed following labs and imaging studies  CBC: Recent Labs  Lab 04/15/20 1253 04/16/20 0132 04/17/20 0231 04/18/20 0418  WBC 6.9 8.0 6.4 7.7  NEUTROABS 4.9  --   --   --   HGB 8.1* 8.1* 7.9* 7.4*  HCT 27.2* 26.9* 26.7* 25.1*  MCV 95.4 94.1 96.0 94.7  PLT 225 242 221 [id number]   Basic Metabolic Panel: Recent Labs  Lab 04/15/20 1253 04/16/20 0132 04/17/20 0231 04/18/20 0418  NA 135 138 139 137  K 4.5 3.6 3.1* 3.6  CL 97* 101 104 103  CO2 '27 26 24 24  '$ GLUCOSE 141* 149* 104* 240*  BUN 23 23 27* 37*  CREATININE 0.96 1.20* 1.42* 2.21*  CALCIUM 8.8* 8.7* 8.4* 8.2*   GFR: Estimated Creatinine Clearance: 23 mL/min (A) (by C-G formula based on SCr of 2.21 mg/dL (H)). Liver Function Tests: Recent Labs  Lab 04/15/20 1253  AST 58*  ALT 25  ALKPHOS 75  BILITOT 1.1  PROT 8.0  ALBUMIN 2.3*   No results for input(s): LIPASE, AMYLASE in the last 168 hours. No results for input(s): AMMONIA in the last 168 hours. Coagulation Profile: Recent Labs  Lab 04/15/20 1253  INR 1.2   Cardiac Enzymes: No results for input(s): CKTOTAL, CKMB, CKMBINDEX, TROPONINI in the last 168 hours. BNP (last 3 results) No results for input(s): PROBNP in the last 8760 hours. HbA1C: Recent Labs    04/16/20 0132  HGBA1C 7.0*   CBG: Recent Labs  Lab 04/17/20 2012 04/18/20 0023 04/18/20 0424 04/18/20 0731 04/18/20 1111  GLUCAP 175* 266*  220* 241* 310*   Lipid Profile: No results for input(s): CHOL, HDL, LDLCALC, TRIG, CHOLHDL, LDLDIRECT in the last 72 hours. Thyroid Function Tests: No results for input(s): TSH, T4TOTAL, FREET4, T3FREE, THYROIDAB in the last 72 hours. Anemia Panel: No results for input(s): VITAMINB12, FOLATE, FERRITIN, TIBC, IRON, RETICCTPCT in the last 72 hours. Sepsis Labs: Recent Labs  Lab 04/15/20 1253 04/16/20 0132 04/17/20 0231  PROCALCITON  --  <0.10 0.13  LATICACIDVEN 0.9  --   --     Recent Results (from the past 240 hour(s))  Blood culture (routine single)     Status: None (Preliminary result)   Collection Time: 04/15/20 12:53 PM   Specimen: BLOOD RIGHT FOREARM  Result Value Ref Range Status   Specimen Description BLOOD RIGHT FOREARM  Final   Special Requests   Final    BOTTLES DRAWN AEROBIC AND ANAEROBIC Blood Culture adequate volume   Culture  Final    NO GROWTH 3 DAYS Performed at [facility], [place] [address] [place], [place] [address]    Report Status PENDING  Incomplete  Resp Panel by RT-PCR (Flu A&B, Covid) Nasopharyngeal Swab     Status: None   Collection Time: 04/15/20 12:55 PM   Specimen: Nasopharyngeal Swab; Nasopharyngeal(NP) swabs in vial transport medium  Result Value Ref Range Status   SARS Coronavirus 2 by RT PCR NEGATIVE NEGATIVE Final    Comment: (NOTE) SARS-CoV-2 target nucleic acids are NOT DETECTED.  The SARS-CoV-2 RNA is generally detectable in upper respiratory specimens during the acute phase of infection. The lowest concentration of SARS-CoV-2 viral copies this assay can detect is 138 copies/mL. A negative result does not preclude SARS-Cov-2 infection and should not be used as the sole basis for treatment or other patient management decisions. A negative result may occur with  improper specimen collection/handling, submission of specimen other than nasopharyngeal swab, presence of viral mutation(s) within the areas targeted by this assay, and  inadequate number of viral copies(<138 copies/mL). A negative result must be combined with clinical observations, patient history, and epidemiological information. The expected result is Negative.  Fact Sheet for Patients:  EntrepreneurPulse.com.au  Fact Sheet for Healthcare Providers:  IncredibleEmployment.be  This test is no t yet approved or cleared by the [place] FDA and  has been authorized for detection and/or diagnosis of SARS-CoV-2 by FDA under an Emergency Use Authorization (EUA). This EUA will remain  in effect (meaning this test can be used) for the duration of the COVID-19 declaration under Section 564(b)(1) of the Act, 21 U.S.C.section 360bbb-3(b)(1), unless the authorization is terminated  or revoked sooner.       Influenza A by PCR NEGATIVE NEGATIVE Final   Influenza B by PCR NEGATIVE NEGATIVE Final    Comment: (NOTE) The Xpert Xpress SARS-CoV-2/FLU/RSV plus assay is intended as an aid in the diagnosis of influenza from Nasopharyngeal swab specimens and should not be used as a sole basis for treatment. Nasal washings and aspirates are unacceptable for Xpert Xpress SARS-CoV-2/FLU/RSV testing.  Fact Sheet for Patients: EntrepreneurPulse.com.au  Fact Sheet for Healthcare Providers: IncredibleEmployment.be  This test is not yet approved or cleared by the [place] FDA and has been authorized for detection and/or diagnosis of SARS-CoV-2 by FDA under an Emergency Use Authorization (EUA). This EUA will remain in effect (meaning this test can be used) for the duration of the COVID-19 declaration under Section 564(b)(1) of the Act, 21 U.S.C. section 360bbb-3(b)(1), unless the authorization is terminated or revoked.  Performed at [facility], [place] [address] [place], [place] [address]   Urine culture     Status: Abnormal   Collection Time: 04/15/20  3:24 PM   Specimen: In/Out  Cath Urine  Result Value Ref Range Status   Specimen Description IN/OUT CATH URINE  Final   Special Requests   Final    NONE Performed at [facility], [place] [address] [place], [place] [address]    Culture (A)  Final    >=100,000 COLONIES/mL VANCOMYCIN RESISTANT ENTEROCOCCUS ISOLATED   Report Status 04/17/2020 FINAL  Final   Organism ID, Bacteria VANCOMYCIN RESISTANT ENTEROCOCCUS ISOLATED (A)  Final      Susceptibility   Vancomycin resistant enterococcus isolated - MIC*    AMPICILLIN >=32 RESISTANT Resistant     NITROFURANTOIN 64 INTERMEDIATE Intermediate     VANCOMYCIN >=32 RESISTANT Resistant     LINEZOLID 2 SENSITIVE Sensitive     * >=  100,000 COLONIES/mL VANCOMYCIN RESISTANT ENTEROCOCCUS ISOLATED  MRSA PCR Screening     Status: None   Collection Time: 04/16/20  4:02 AM   Specimen: Nasal Mucosa; Nasopharyngeal  Result Value Ref Range Status   MRSA by PCR NEGATIVE NEGATIVE Final    Comment:        The GeneXpert MRSA Assay (FDA approved for NASAL specimens only), is one component of a comprehensive MRSA colonization surveillance program. It is not intended to diagnose MRSA infection nor to guide or monitor treatment for MRSA infections. Performed at [facility], [place] [address] [place], [place] [address]          Radiology Studies: DG CHEST PORT 1 VIEW  Result Date: 04/17/2020 CLINICAL DATA:  Pneumonia. EXAM: PORTABLE CHEST 1 VIEW COMPARISON:  April 15, 2020. FINDINGS: Stable cardiomediastinal silhouette. Tracheostomy tube is in good position. No pneumothorax is noted. Small loculated left pleural effusion is noted. Stable bilateral lung opacities are noted concerning for pulmonary edema. Bony thorax is unremarkable. IMPRESSION: Stable bilateral lung opacities are noted concerning for pulmonary edema. Small loculated left pleural effusion is noted. Aortic Atherosclerosis (ICD10-I70.0). Electronically Signed   By: Marijo Conception M.D.   On: 04/17/2020 08:27    ECHOCARDIOGRAM COMPLETE  Result Date: 04/16/2020    ECHOCARDIOGRAM REPORT   Patient Name:   DEONICA VANTOL Date of Exam: 04/16/2020 Medical Rec #:  [id number]   Height:       67.0 in Accession #:    [id number]  Weight:       173.3 lb Date of Birth:  10-Dec-1940   BSA:          1.903 m Patient Age:    64 years    BP:           122/77 mmHg Patient Gender: F           HR:           72 bpm. Exam Location:  Inpatient Procedure: 2D Echo, Cardiac Doppler and Color Doppler Indications:    I48.2 Chronic atrial fibrillation; I48.91* Unspeicified atrial                 fibrillation  History:        Patient has no prior history of Echocardiogram examinations.                 Stroke; Risk Factors:Diabetes.  Sonographer:    Bernadene Person RDCS Referring Phys: [id number] [facility] A CHANDRASEKHAR IMPRESSIONS  1. Left ventricular ejection fraction, by estimation, is 55 to 60%. The left ventricle has normal function. The left ventricle has no regional wall motion abnormalities. Left ventricular diastolic function could not be evaluated.  2. Right ventricular systolic function is normal. The right ventricular size is normal. There is moderately elevated pulmonary artery systolic pressure.  3. Left atrial size was mildly dilated.  4. The mitral valve is grossly normal. Mild to moderate mitral valve regurgitation.  5. Tricuspid valve regurgitation is moderate.  6. The aortic valve is calcified. There is mild calcification of the aortic valve. There is mild thickening of the aortic valve. Aortic valve regurgitation is mild.  7. Aortic dilatation noted. There is mild dilatation of the ascending aorta, measuring 39 mm. FINDINGS  Left Ventricle: Left ventricular ejection fraction, by estimation, is 55 to 60%. The left ventricle has normal function. The left ventricle has no regional wall motion abnormalities. The left ventricular internal cavity size was normal in size. There is  no  left ventricular hypertrophy. Left ventricular diastolic  function could not be evaluated due to atrial fibrillation. Left ventricular diastolic function could not be evaluated. Right Ventricle: The right ventricular size is normal. No increase in right ventricular wall thickness. Right ventricular systolic function is normal. There is moderately elevated pulmonary artery systolic pressure. The tricuspid regurgitant velocity is 3.60 m/s, and with an assumed right atrial pressure of 8 mmHg, the estimated right ventricular systolic pressure is 123456 mmHg. Left Atrium: Left atrial size was mildly dilated. Right Atrium: Right atrial size was normal in size. Pericardium: There is no evidence of pericardial effusion. Mitral Valve: The mitral valve is grossly normal. There is moderate thickening of the mitral valve leaflet(s). There is mild calcification of the mitral valve leaflet(s). Mild to moderate mitral annular calcification. Mild to moderate mitral valve regurgitation. Tricuspid Valve: The tricuspid valve is normal in structure. Tricuspid valve regurgitation is moderate. Aortic Valve: The aortic valve is calcified. There is mild calcification of the aortic valve. There is mild thickening of the aortic valve. Aortic valve regurgitation is mild. Aortic regurgitation PHT measures 455 msec. Pulmonic Valve: The pulmonic valve was normal in structure. Pulmonic valve regurgitation is not visualized. Aorta: Aortic dilatation noted. There is mild dilatation of the ascending aorta, measuring 39 mm. IAS/Shunts: The atrial septum is grossly normal.  LEFT VENTRICLE PLAX 2D LVIDd:         5.10 cm LVIDs:         3.90 cm LV PW:         0.90 cm LV IVS:        1.00 cm LVOT diam:     2.00 cm LV SV:         49 LV SV Index:   26 LVOT Area:     3.14 cm  LV Volumes (MOD) LV vol d, MOD A2C: 96.4 ml LV vol d, MOD A4C: 84.5 ml LV vol s, MOD A2C: 39.2 ml LV vol s, MOD A4C: 40.4 ml LV SV MOD A2C:     57.2 ml LV SV MOD A4C:     84.5 ml LV SV MOD BP:      52.9 ml RIGHT VENTRICLE RV S prime:     6.94  cm/s TAPSE (M-mode): 1.1 cm LEFT ATRIUM             Index       RIGHT ATRIUM           Index LA diam:        3.70 cm 1.94 cm/m  RA Area:     17.00 cm LA Vol (A2C):   52.0 ml 27.33 ml/m RA Volume:   46.10 ml  24.23 ml/m LA Vol (A4C):   68.7 ml 36.11 ml/m LA Biplane Vol: 51.3 ml 26.96 ml/m  AORTIC VALVE LVOT Vmax:   93.38 cm/s LVOT Vmean:  63.350 cm/s LVOT VTI:    0.156 m AI PHT:      455 msec  AORTA Ao Root diam: 3.30 cm Ao Asc diam:  3.90 cm MR Peak grad:    89.1 mmHg   TRICUSPID VALVE MR Mean grad:    61.0 mmHg   TR Peak grad:   51.8 mmHg MR Vmax:         472.00 cm/s TR Vmax:        360.00 cm/s MR Vmean:        370.0 cm/s MR PISA:         0.57 cm  SHUNTS MR PISA Eff ROA: 5 mm       Systemic VTI:  0.16 m MR PISA Radius:  0.30 cm     Systemic Diam: 2.00 cm Mertie Moores MD Electronically signed by Mertie Moores MD Signature Date/Time: 04/16/2020/4:09:55 PM    Final         Scheduled Meds: . apixaban  5 mg Per Tube BID  . budesonide (PULMICORT) nebulizer solution  0.25 mg Nebulization BID  . chlorhexidine  15 mL Mouth Rinse BID  . Chlorhexidine Gluconate Cloth  6 each Topical Daily  . diltiazem  90 mg Per Tube Q6H  . insulin aspart  0-6 Units Subcutaneous Q4H  . ipratropium-albuterol  3 mL Nebulization TID  . levETIRAcetam  750 mg Per Tube BID  . mouth rinse  15 mL Mouth Rinse q12n4p  . pantoprazole (PROTONIX) IV  40 mg Intravenous Daily  . sodium chloride flush  10-40 mL Intracatheter Q12H   Continuous Infusions: . azithromycin 500 mg (04/18/20 0030)  . ceFEPime (MAXIPIME) IV 2 g (04/18/20 1102)  . feeding supplement (OSMOLITE 1.5 CAL) 55 mL/hr at 04/18/20 1100  . linezolid (ZYVOX) IV 600 mg (04/18/20 0950)     LOS: 3 days    Time spent: 35 minutes.     Domenic Polite, MD [facility]  04/18/2020, 2:19 PM

## 2020-04-18 NOTE — Progress Notes (Signed)
Progress Note  Patient Name: Rebecca Harrell Date of Encounter: 04/18/2020  Primary Cardiologist: New to [facility]  Subjective   Rates uncontrolled today in the 110's. Nods appropriately to questioning and tracks.  Inpatient Medications    Scheduled Meds: . apixaban  5 mg Per Tube BID  . budesonide (PULMICORT) nebulizer solution  0.25 mg Nebulization BID  . chlorhexidine  15 mL Mouth Rinse BID  . Chlorhexidine Gluconate Cloth  6 each Topical Daily  . diltiazem  90 mg Per Tube Q6H  . insulin aspart  0-6 Units Subcutaneous Q4H  . ipratropium-albuterol  3 mL Nebulization TID  . levETIRAcetam  750 mg Per Tube BID  . mouth rinse  15 mL Mouth Rinse q12n4p  . pantoprazole (PROTONIX) IV  40 mg Intravenous Daily  . sodium chloride flush  10-40 mL Intracatheter Q12H   Continuous Infusions: . azithromycin 500 mg (04/18/20 0030)  . ceFEPime (MAXIPIME) IV 2 g (04/17/20 2201)  . feeding supplement (OSMOLITE 1.5 CAL) 50 mL/hr at 04/18/20 0711  . linezolid (ZYVOX) IV 600 mg (04/18/20 0950)   PRN Meds: acetaminophen, morphine injection, ondansetron (ZOFRAN) IV, sodium chloride flush, sodium chloride HYPERTONIC   Vital Signs    Vitals:   04/18/20 0300 04/18/20 0732 04/18/20 0833 04/18/20 0840  BP: 126/74 116/73 119/80   Pulse: (!) 123 (!) 106 (!) 115 (!) 114  Resp: 20 20 (!) 29 (!) 25  Temp: 99.2 F (37.3 C) 98 F (36.7 C)    TempSrc: Oral Axillary    SpO2: 100% 97% 97% 95%  Weight: 78.5 kg     Height:        Intake/Output Summary (Last 24 hours) at 04/18/2020 1052 Last data filed at 04/17/2020 1614 Gross per 24 hour  Intake 783.38 ml  Output --  Net 783.38 ml   Filed Weights   04/15/20 1304 04/16/20 0339 04/18/20 0300  Weight: 77.1 kg 78.6 kg 78.5 kg    Physical Exam   General: Elderly, NAD Lungs:Clear to ausculation bilaterally. Cardiovascular: Irregularly irregular. No murmurs Abdomen: Soft, non-tender, non-distended. No obvious abdominal masses. No tendered and  feeding tube site Extremities: No edema. Radial pulses 2+ bilaterally R stump site c/d/i; left pressure ulcer present on admission stable Neuro: Nods appropriately to questioning   Labs    Chemistry Recent Labs  Lab 04/15/20 1253 04/16/20 0132 04/17/20 0231 04/18/20 0418  NA 135 138 139 137  K 4.5 3.6 3.1* 3.6  CL 97* 101 104 103  CO2 '27 26 24 24  '$ GLUCOSE 141* 149* 104* 240*  BUN 23 23 27* 37*  CREATININE 0.96 1.20* 1.42* 2.21*  CALCIUM 8.8* 8.7* 8.4* 8.2*  PROT 8.0  --   --   --   ALBUMIN 2.3*  --   --   --   AST 58*  --   --   --   ALT 25  --   --   --   ALKPHOS 75  --   --   --   BILITOT 1.1  --   --   --   GFRNONAA >60 47* 38* 22*  ANIONGAP '11 11 11 10     '$ Hematology Recent Labs  Lab 04/16/20 0132 04/17/20 0231 04/18/20 0418  WBC 8.0 6.4 7.7  RBC 2.86* 2.78* 2.65*  HGB 8.1* 7.9* 7.4*  HCT 26.9* 26.7* 25.1*  MCV 94.1 96.0 94.7  MCH 28.3 28.4 27.9  MCHC 30.1 29.6* 29.5*  RDW 16.0* 16.1* 16.3*  PLT 242  221 234    Cardiac EnzymesNo results for input(s): TROPONINI in the last 168 hours. No results for input(s): TROPIPOC in the last 168 hours.   BNPNo results for input(s): BNP, PROBNP in the last 168 hours.   DDimer No results for input(s): DDIMER in the last 168 hours.   Radiology    DG CHEST PORT 1 VIEW  Result Date: 04/17/2020 CLINICAL DATA:  Pneumonia. EXAM: PORTABLE CHEST 1 VIEW COMPARISON:  April 15, 2020. FINDINGS: Stable cardiomediastinal silhouette. Tracheostomy tube is in good position. No pneumothorax is noted. Small loculated left pleural effusion is noted. Stable bilateral lung opacities are noted concerning for pulmonary edema. Bony thorax is unremarkable. IMPRESSION: Stable bilateral lung opacities are noted concerning for pulmonary edema. Small loculated left pleural effusion is noted. Aortic Atherosclerosis (ICD10-I70.0). Electronically Signed   By: Marijo Conception M.D.   On: 04/17/2020 08:27   ECHOCARDIOGRAM COMPLETE  Result Date:  04/16/2020    ECHOCARDIOGRAM REPORT   Patient Name:   Rebecca Harrell Date of Exam: 04/16/2020 Medical Rec #:  [id number]   Height:       67.0 in Accession #:    [id number]  Weight:       173.3 lb Date of Birth:  1940/05/22   BSA:          1.903 m Patient Age:    80 years    BP:           122/77 mmHg Patient Gender: F           HR:           72 bpm. Exam Location:  Inpatient Procedure: 2D Echo, Cardiac Doppler and Color Doppler Indications:    I48.2 Chronic atrial fibrillation; I48.91* Unspeicified atrial                 fibrillation  History:        Patient has no prior history of Echocardiogram examinations.                 Stroke; Risk Factors:Diabetes.  Sonographer:    Bernadene Person RDCS Referring Phys: [id number] [facility] A Dominie Benedick IMPRESSIONS  1. Left ventricular ejection fraction, by estimation, is 55 to 60%. The left ventricle has normal function. The left ventricle has no regional wall motion abnormalities. Left ventricular diastolic function could not be evaluated.  2. Right ventricular systolic function is normal. The right ventricular size is normal. There is moderately elevated pulmonary artery systolic pressure.  3. Left atrial size was mildly dilated.  4. The mitral valve is grossly normal. Mild to moderate mitral valve regurgitation.  5. Tricuspid valve regurgitation is moderate.  6. The aortic valve is calcified. There is mild calcification of the aortic valve. There is mild thickening of the aortic valve. Aortic valve regurgitation is mild.  7. Aortic dilatation noted. There is mild dilatation of the ascending aorta, measuring 39 mm. FINDINGS  Left Ventricle: Left ventricular ejection fraction, by estimation, is 55 to 60%. The left ventricle has normal function. The left ventricle has no regional wall motion abnormalities. The left ventricular internal cavity size was normal in size. There is  no left ventricular hypertrophy. Left ventricular diastolic function could not be evaluated due to atrial  fibrillation. Left ventricular diastolic function could not be evaluated. Right Ventricle: The right ventricular size is normal. No increase in right ventricular wall thickness. Right ventricular systolic function is normal. There is moderately elevated pulmonary artery systolic pressure. The tricuspid regurgitant velocity is  3.60 m/s, and with an assumed right atrial pressure of 8 mmHg, the estimated right ventricular systolic pressure is 123456 mmHg. Left Atrium: Left atrial size was mildly dilated. Right Atrium: Right atrial size was normal in size. Pericardium: There is no evidence of pericardial effusion. Mitral Valve: The mitral valve is grossly normal. There is moderate thickening of the mitral valve leaflet(s). There is mild calcification of the mitral valve leaflet(s). Mild to moderate mitral annular calcification. Mild to moderate mitral valve regurgitation. Tricuspid Valve: The tricuspid valve is normal in structure. Tricuspid valve regurgitation is moderate. Aortic Valve: The aortic valve is calcified. There is mild calcification of the aortic valve. There is mild thickening of the aortic valve. Aortic valve regurgitation is mild. Aortic regurgitation PHT measures 455 msec. Pulmonic Valve: The pulmonic valve was normal in structure. Pulmonic valve regurgitation is not visualized. Aorta: Aortic dilatation noted. There is mild dilatation of the ascending aorta, measuring 39 mm. IAS/Shunts: The atrial septum is grossly normal.  LEFT VENTRICLE PLAX 2D LVIDd:         5.10 cm LVIDs:         3.90 cm LV PW:         0.90 cm LV IVS:        1.00 cm LVOT diam:     2.00 cm LV SV:         49 LV SV Index:   26 LVOT Area:     3.14 cm  LV Volumes (MOD) LV vol d, MOD A2C: 96.4 ml LV vol d, MOD A4C: 84.5 ml LV vol s, MOD A2C: 39.2 ml LV vol s, MOD A4C: 40.4 ml LV SV MOD A2C:     57.2 ml LV SV MOD A4C:     84.5 ml LV SV MOD BP:      52.9 ml RIGHT VENTRICLE RV S prime:     6.94 cm/s TAPSE (M-mode): 1.1 cm LEFT ATRIUM              Index       RIGHT ATRIUM           Index LA diam:        3.70 cm 1.94 cm/m  RA Area:     17.00 cm LA Vol (A2C):   52.0 ml 27.33 ml/m RA Volume:   46.10 ml  24.23 ml/m LA Vol (A4C):   68.7 ml 36.11 ml/m LA Biplane Vol: 51.3 ml 26.96 ml/m  AORTIC VALVE LVOT Vmax:   93.38 cm/s LVOT Vmean:  63.350 cm/s LVOT VTI:    0.156 m AI PHT:      455 msec  AORTA Ao Root diam: 3.30 cm Ao Asc diam:  3.90 cm MR Peak grad:    89.1 mmHg   TRICUSPID VALVE MR Mean grad:    61.0 mmHg   TR Peak grad:   51.8 mmHg MR Vmax:         472.00 cm/s TR Vmax:        360.00 cm/s MR Vmean:        370.0 cm/s MR PISA:         0.57 cm    SHUNTS MR PISA Eff ROA: 5 mm       Systemic VTI:  0.16 m MR PISA Radius:  0.30 cm     Systemic Diam: 2.00 cm Mertie Moores MD Electronically signed by Mertie Moores MD Signature Date/Time: 04/16/2020/4:09:55 PM    Final    Telemetry    04/17/20 AF  with rates in the 120 range - Personally Reviewed  ECG    No new tracing as of 04/17/20 - Personally Reviewed  Cardiac Studies   Echocardiogram:   Result date: 12/07/08 No evidence of intra-cardiac shunting by agitated IV saline contrast  No evidence of vegetation.  Mild concentric left ventricular hypertrophy is observed.  Global left ventricular wall motion and contractility are within normal  limits.  The ejection fraction is estimated to be 65%.  Evidence of mild mitral annular calcification.  There is mild dilatation of the ascending aorta.  Rhythm AF   Patient Profile     80 y.o. female with a hx of PAF, DM2, HTN, seizures, HFpEF, CVA with residual R sided deficits, GERD and trach who presented from [place] 2/2 ongoing AF/RVRsince Friday.  Assessment & Plan    1. Atrial fibrillation with RVR: -Hx of PAF dating back to 2010 not on anticoagulation for unclear reasons per record review who presented to [facility] from [facility] with persistent AF with rates in the 125-150 range. -Continue Eliquis given elevated  CHA2DS2VASc 7 (age x2, CVA x2, DM, HTN, gender) -increaseing diltiazem '90mg'$  Q6H -Echo performed 04/16/20 with LVEF at 55-60%, no RWMA, mod elevated PA pressures, mild to mod MR, mild AR and aortic dilatation at 56m    HTN: -Hypotensive on admission which responded to IVF  - has tolerated AV nodal agents without issue  Hx of CVA: -Residual hemiparesis and dysphagia s/p tracheostomy and PEG  -Started on Eliquis this admission   Acute on chronic hypoxic respiratory failure failure: -s/p trach>>>PCCM consulted -Management per IM and PCCM   DM2: -SSI for glucose control -Per IM    For questions or updates, please contact   Please consult [email or web address] for contact info under Cardiology/STEMI.  MRudean Haskell MD [address] [place] [id number]([phone] 10:55 AM

## 2020-04-18 NOTE — Progress Notes (Signed)
Nutrition Follow-up  DOCUMENTATION CODES:   Not applicable  INTERVENTION:  Continue Osmolite 1.5 cal formula via PEG at goal rate of 55 ml/hr.   Provide 45 ml Prosource TF once daily per tube  Tube feeding to provide 2020 kcal, 94 grams of protein, and 1003 ml free water.   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 fib and currently under treatment for pneumonia at [facility] who presents for evaluation of persistent tachycardia.  Pt continues on trach collar. Tube feeding initiated yesterday via PEG. Pt currently at goal rate and has been tolerating her tube feeds well. RD to order Prosource TF to aid in adequate protein needs. Per MD, pt remains in rapid A fib.   Labs and medications reviewed.   Diet Order:   Diet Order            Diet NPO time specified  Diet effective now                 EDUCATION NEEDS:   Not appropriate for education at this time  Skin:  Skin Assessment: Skin Integrity Issues: Skin Integrity Issues:: Other (Comment) Other: L third toe necrotic, R AKA  Last BM:  3/15  Height:   Ht Readings from Last 1 Encounters:  04/15/20 _0  (1.702 m)    Weight:   Wt Readings from Last 1 Encounters:  04/18/20 78.5 kg   BMI:  Body mass index is 27.11 kg/m.  Estimated Nutritional Needs:   Kcal:  1850-2100  Protein:  90-105 grams  Fluid:  >/=  1.8 L/day  Corrin Parker, MS, RD, LDN RD pager number/after hours weekend pager number on Amion.

## 2020-04-18 NOTE — Progress Notes (Signed)
PHARMACY NOTE:  ANTIMICROBIAL RENAL DOSAGE ADJUSTMENT  Current antimicrobial regimen includes a mismatch between antimicrobial dosage and estimated renal function.  As per policy approved by the Pharmacy & Therapeutics and Medical Executive Committees, the antimicrobial dosage will be adjusted accordingly.  Current antimicrobial dosage:  Cefepime 2gm IV q12h  Indication: PNA  Renal Function:  Estimated Creatinine Clearance: 23 mL/min (A) (by C-G formula based on SCr of 2.21 mg/dL (H)).     Antimicrobial dosage has been changed to: Cefepime 2gm IV q24h (end date established for 3/18)  Additional comments:   Thank you for allowing pharmacy to be a part of this patient's care.  Hildred Laser, PharmD Clinical Pharmacist **Pharmacist phone directory can now be found on [email or web address] (PW [email or web address]).  Listed under [place].

## 2020-04-18 NOTE — Progress Notes (Signed)
Inpatient Diabetes Program Recommendations  AACE/ADA: New Consensus Statement on Inpatient Glycemic Control (2015)  Target Ranges:  Prepandial:   less than 140 mg/dL      Peak postprandial:   less than 180 mg/dL (1-2 hours)      Critically ill patients:  140 - 180 mg/dL   Lab Results  Component Value Date   GLUCAP 310 (H) 04/18/2020   HGBA1C 7.0 (H) 04/16/2020    Review of Glycemic Control Results for Rebecca Harrell, Rebecca Harrell (MRN [id number]) as of 04/18/2020 14:24  Ref. Range 04/17/2020 20:12 04/18/2020 00:23 04/18/2020 04:24 04/18/2020 07:31 04/18/2020 11:11  Glucose-Capillary Latest Ref Range: 70 - 99 mg/dL 175 (H) 266 (H) 220 (H) 241 (H) 310 (H)   Diabetes history: DM 2 Outpatient Diabetes medications:  Lantus 20 units q HS Novolin R- 2-10 units q 6 hours Current orders for Inpatient glycemic control:  Novolog 0-6 units q 4 hours Inpatient Diabetes Program Recommendations:   May consider adding Levemir 8 units bid.    Thanks,  Adah Perl, RN, BC-ADM Inpatient Diabetes Coordinator Pager [phone] (8a-5p)

## 2020-04-18 NOTE — Progress Notes (Addendum)
NAME:  Rebecca Harrell, MRN:  [id number], DOB:  12/17/1940, LOS: 3 ADMISSION DATE:  04/15/2020, CONSULTATION DATE:  04/16/2020 REFERRING MD:  Dr. Myna Hidalgo, CHIEF COMPLAINT:  04/16/2020  Brief History:  33 yoF with chronic respiratory failure s/p trach sent from [facility] for Afib with RVR.  Patient is also being treated for pneumonia on zosyn pta.  CXR concerning for possible partially loculated effusion.  Pulmonary consulted for further evaluation.   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  Found to be in Afib with RVR and initially hypotensive, which resolved after 2L NS bolus.  Patient afebrile and remains on ATC trial.   CTA without PE, notable for pulmonary edema with small bilateral pulmonary effusions and loculated pleural fluid accumulation within R minor fissure and  Oblique on the L.  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 Negative for acute pulmonary embolism. 2. Findings suggestive of pulmonary edema with small bilateral pleural  effusions. Loculated pleural fluid accumulation within the minor fissure on the right and within the oblique fissure on the left. 3. Patchy bibasilar airspace opacities may represent edema, atelectasis, or pneumonia. 4. Patient's percutaneous gastrostomy tube appears malpositioned although is incompletely visualized at the edge of the field of view. CT of the abdomen could be obtained to further evaluate. 5. Mildly prominent mediastinal lymph nodes, likely reactive. 6. Aortic and coronary artery atherosclerosis   3/13 CT abd/ pelvis >> 1. Percutaneous gastrostomy tube is appropriately positioned with balloon located intraluminally in the anterior aspect of the distal gastric body. No evidence of outlet obstruction. 2. No acute findings within the abdomen. 3. Small bilateral pleural effusions with patchy bibasilar opacities. Aortic Atherosclerosis   Micro Data:  3/13 BCx>> 3/13 SARS 2/ flu >> neg 3/13 UCx >> 100k Vanc resistant enterococcus 3/13 sputum cx >> 3/14 MRSA >> neg  Antimicrobials:  pta zosyn 3/13 azithro >> 3/13 cefepime >> 3/13 vanc >>3/14 3/14 linezolid >>   Interim History / Subjective:  Afebrile. Improved O2 requirement. UOP 500cc + 1 occurrence.  Objective   Blood pressure 119/80, pulse (!) 114, temperature 98 F (36.7 C), temperature source Axillary, resp. rate (!) 25, height '5\' 7"'$  (1.702 m), weight 78.5 kg, SpO2 95 %.    FiO2 (%):  [28 %] 28 %   Intake/Output Summary (Last 24 hours) at 04/18/2020 0956 Last data filed at 04/17/2020 1614 Gross per 24 hour  Intake 783.38 ml  Output --  Net 783.38 ml   Filed Weights   04/15/20 1304 04/16/20 0339 04/18/20 0300  Weight: 77.1 kg 78.6 kg 78.5 kg    Physical Exam: General: Chronically ill-appearing, no acute distress HENT: [place], AT, OP clear, MMM Neck: Trach in place, c/d/i Eyes: EOMI, no scleral icterus Respiratory:  Diminished breath sounds bilaterally.  Rhonchi improved Cardiovascular: RRR, -M/R/G, no  JVD GI: BS+, soft, nontender Extremities:-Edema,-tenderness Neuro: Opens eyes spontaneously, does not follow commands, no tracking   Resolved Hospital Problem list    Assessment & Plan:   Acute onChronic hypoxic respiratory failure HCAP Loculated pleural effusion  Pulmonary consulted for respiratory failure and evaluation of abnormal CT. On review of CTA, the location of the loculated effusions in the right minor fissure and the left oblique fissure in the setting of bilateral pleural effusions likely represent volume overload. She has been gently diuresed with fair UOP. Her oxygen requirement has improved and now with AKI. Will hold off on further diuresis. Doubt these findings represent infection however if she does develop fever, worsening hypoxemia or clinical condition, would consider repeat CT at that time.  Plan: --Wean oxygen requirement --Hold diuresis today --Continue bronchodilators: Duonebs TID and Pulmicort BID --Routine trach care --Consider CT chest when renal function improves  Atrial Fibrillation  Worsening tachycardia the reason for initial EMS call from [facility], not improved with Metoprolol, not on anti-coagulation for unclear reasons. Echo with normal EF, mod elevated PASP P: -Management and anticoagulation per Cardiology  Pulmonary will follow  Best practice (evaluated daily)  Diet: per primary Pain/Anxiety/Delirium protocol (if indicated): n/a VAP protocol (if indicated): n/a DVT prophylaxis: Eliquis GI prophylaxis: n/a Glucose control: SSI Mobility: bed rest Disposition: progressive care  Goals of Care:  Last date of multidisciplinary goals of care discussion: per primary Family and staff present:  Summary of discussion:  Follow up goals of care discussion due:  Code Status: full code  Labs   CBC: Recent Labs  Lab 04/15/20 1253 04/16/20 0132 04/17/20 0231 04/18/20 0418  WBC 6.9 8.0 6.4 7.7  NEUTROABS 4.9  --   --   --   HGB 8.1* 8.1* 7.9*  7.4*  HCT 27.2* 26.9* 26.7* 25.1*  MCV 95.4 94.1 96.0 94.7  PLT 225 242 221 [id number]    Basic Metabolic Panel: Recent Labs  Lab 04/15/20 1253 04/16/20 0132 04/17/20 0231 04/18/20 0418  NA 135 138 139 137  K 4.5 3.6 3.1* 3.6  CL 97* 101 104 103  CO2 '27 26 24 24  '$ GLUCOSE 141* 149* 104* 240*  BUN 23 23 27* 37*  CREATININE 0.96 1.20* 1.42* 2.21*  CALCIUM 8.8* 8.7* 8.4* 8.2*   GFR: Estimated Creatinine Clearance: 23 mL/min (A) (by C-G formula based on SCr of 2.21 mg/dL (H)).  Care time: 1 minutes     Rodman Pickle, M.D. [facility] Pulmonary/Critical Care Medicine 04/18/2020 9:56 AM   Please see Amion for pager number to reach on-call Pulmonary and Critical Care Team.

## 2020-04-19 ENCOUNTER — Inpatient Hospital Stay (HOSPITAL_COMMUNITY): Payer: Medicare Other

## 2020-04-19 DIAGNOSIS — I4891 Unspecified atrial fibrillation: Secondary | ICD-10-CM | POA: Diagnosis not present

## 2020-04-19 LAB — GLUCOSE, CAPILLARY
Glucose-Capillary: 337 mg/dL — ABNORMAL HIGH (ref 70–99)
Glucose-Capillary: 348 mg/dL — ABNORMAL HIGH (ref 70–99)
Glucose-Capillary: 373 mg/dL — ABNORMAL HIGH (ref 70–99)
Glucose-Capillary: 374 mg/dL — ABNORMAL HIGH (ref 70–99)
Glucose-Capillary: 410 mg/dL — ABNORMAL HIGH (ref 70–99)
Glucose-Capillary: 425 mg/dL — ABNORMAL HIGH (ref 70–99)

## 2020-04-19 LAB — BASIC METABOLIC PANEL
Anion gap: 12 (ref 5–15)
BUN: 46 mg/dL — ABNORMAL HIGH (ref 8–23)
CO2: 20 mmol/L — ABNORMAL LOW (ref 22–32)
Calcium: 8.1 mg/dL — ABNORMAL LOW (ref 8.9–10.3)
Chloride: 101 mmol/L (ref 98–111)
Creatinine, Ser: 3.07 mg/dL — ABNORMAL HIGH (ref 0.44–1.00)
GFR, Estimated: 15 mL/min — ABNORMAL LOW (ref >60)
Glucose, Bld: 452 mg/dL — ABNORMAL HIGH (ref 70–99)
Potassium: 4.4 mmol/L (ref 3.5–5.1)
Sodium: 133 mmol/L — ABNORMAL LOW (ref 135–145)

## 2020-04-19 LAB — CBC
HCT: 24.8 % — ABNORMAL LOW (ref 36.0–46.0)
Hemoglobin: 7.6 g/dL — ABNORMAL LOW (ref 12.0–15.0)
MCH: 29.1 pg (ref 26.0–34.0)
MCHC: 30.6 g/dL (ref 30.0–36.0)
MCV: 95 fL (ref 80.0–100.0)
Platelets: 239 10*3/uL (ref 150–400)
RBC: 2.61 MIL/uL — ABNORMAL LOW (ref 3.87–5.11)
RDW: 16.4 % — ABNORMAL HIGH (ref 11.5–15.5)
WBC: 10.5 10*3/uL (ref 4.0–10.5)
nRBC: 0 % (ref 0.0–0.2)

## 2020-04-19 MED ORDER — SODIUM CHLORIDE 0.9 % IV SOLN: INTRAVENOUS | Status: DC

## 2020-04-19 MED ORDER — INSULIN DETEMIR 100 UNIT/ML ~~LOC~~ SOLN
12.0000 [IU] | Freq: Two times a day (BID) | SUBCUTANEOUS | Status: DC
  Administered 2020-04-19 – 2020-04-20 (×2): 12 [IU] via SUBCUTANEOUS
  Filled 2020-04-19 (×4): qty 0.12

## 2020-04-19 MED ORDER — DILTIAZEM 12 MG/ML ORAL SUSPENSION
120.0000 mg | Freq: Four times a day (QID) | ORAL | Status: DC
  Administered 2020-04-19 (×3): 120 mg
  Filled 2020-04-19 (×6): qty 12

## 2020-04-19 MED ORDER — ALBUMIN HUMAN 5 % IV SOLN
12.5000 g | Freq: Once | INTRAVENOUS | Status: AC
  Administered 2020-04-19: 12.5 g via INTRAVENOUS
  Filled 2020-04-19: qty 250

## 2020-04-19 MED ORDER — ALBUMIN HUMAN 5 % IV SOLN
12.5000 g | Freq: Once | INTRAVENOUS | Status: DC
  Filled 2020-04-19 (×2): qty 250

## 2020-04-19 MED ORDER — INSULIN ASPART 100 UNIT/ML ~~LOC~~ SOLN
4.0000 [IU] | SUBCUTANEOUS | Status: DC
  Administered 2020-04-19 – 2020-04-22 (×13): 4 [IU] via SUBCUTANEOUS

## 2020-04-19 MED ORDER — FUROSEMIDE 10 MG/ML IJ SOLN
60.0000 mg | Freq: Once | INTRAMUSCULAR | Status: AC
  Administered 2020-04-19: 60 mg via INTRAVENOUS
  Filled 2020-04-19: qty 6

## 2020-04-19 MED ORDER — INSULIN ASPART 100 UNIT/ML ~~LOC~~ SOLN
10.0000 [IU] | Freq: Once | SUBCUTANEOUS | Status: AC
  Administered 2020-04-19: 10 [IU] via SUBCUTANEOUS

## 2020-04-19 MED ORDER — METOPROLOL TARTRATE 12.5 MG HALF TABLET
12.5000 mg | ORAL_TABLET | Freq: Four times a day (QID) | ORAL | Status: DC
  Administered 2020-04-19: 12.5 mg
  Filled 2020-04-19: qty 1

## 2020-04-19 NOTE — Progress Notes (Signed)
While on way down to Radiology patient appeared diaphoretic.  Sats 95% and higher.  Rapid notified.  On transport back to room. Sats dropped to 60's-80's.  Patient added back to wall O2 Supply from portable tank.  Sats recovered to greater than 90%.  Respiratory at notified, came to assess patient and preformed suctioning.  Dr. Posey Pronto and Dr. Broadus John at bedside to assess patient.  New orders received.

## 2020-04-19 NOTE — Progress Notes (Signed)
NAME:  Rebecca Harrell, MRN:  [id number], DOB:  12/08/1940, LOS: 4 ADMISSION DATE:  04/15/2020, CONSULTATION DATE:  04/16/2020 REFERRING MD:  Dr. Myna Hidalgo, CHIEF COMPLAINT:  04/16/2020  Brief History:  33 yoF with chronic respiratory failure s/p trach sent from [facility] for Afib with RVR.  Patient is also being treated for pneumonia on zosyn pta.  CXR concerning for possible partially loculated effusion.  Pulmonary consulted for further evaluation.   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  Found to be in Afib with RVR and initially hypotensive, which resolved after 2L NS bolus.  Patient afebrile and remains on ATC trial.   CTA without PE, notable for pulmonary edema with small bilateral pulmonary effusions and loculated pleural fluid accumulation within R minor fissure and  Oblique on the L.  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 Negative for acute pulmonary embolism. 2. Findings suggestive of  pulmonary edema with small bilateral pleural effusions. Loculated pleural fluid accumulation within the minor fissure on the right and within the oblique fissure on the left. 3. Patchy bibasilar airspace opacities may represent edema, atelectasis, or pneumonia. 4. Patient's percutaneous gastrostomy tube appears malpositioned although is incompletely visualized at the edge of the field of view. CT of the abdomen could be obtained to further evaluate. 5. Mildly prominent mediastinal lymph nodes, likely reactive. 6. Aortic and coronary artery atherosclerosis   3/13 CT abd/ pelvis >> 1. Percutaneous gastrostomy tube is appropriately positioned with balloon located intraluminally in the anterior aspect of the distal gastric body. No evidence of outlet obstruction. 2. No acute findings within the abdomen. 3. Small bilateral pleural effusions with patchy bibasilar opacities. Aortic Atherosclerosis   Micro Data:  3/13 BCx>> 3/13 SARS 2/ flu >> neg 3/13 UCx >> 100k Vanc resistant enterococcus 3/13 sputum cx >> 3/14 MRSA >> neg  Antimicrobials:  pta zosyn 3/13 azithro >> 3/13 cefepime >> 3/13 vanc >>3/14 3/14 linezolid >>   Interim History / Subjective:  Increased O2 requirement yesterday. Thick white secretions today from trach  Objective   Blood pressure 105/62, pulse (!) 108, temperature 98.1 F (36.7 C), temperature source Axillary, resp. rate (!) 25, height '5\' 7"'$  (1.702 m), weight 80.1 kg, SpO2 98 %.    FiO2 (%):  [28 %-60 %] 40 %  No intake or output data in the 24 hours ending 04/19/20 1131 Filed Weights   04/16/20 0339 04/18/20 0300 04/19/20 0433  Weight: 78.6 kg 78.5 kg 80.1 kg    Physical Exam: General: Chronically ill-appearing, no acute distress HENT: [place], AT, OP clear, MMM Neck: Trach in place, thick white secretions Eyes: EOMI, no scleral icterus Respiratory: Coarse breath soundsbilaterally.  No wheezing or rales Cardiovascular: RRR, -  M/R/G, no JVD GI: BS+, soft,  non-tender Extremities: R AKA, -Edema,-tenderness Neuro: Opens eyes to voice, tracks, nods appropriately  Resolved Hospital Problem list    Assessment & Plan:   Acute onChronic hypoxic respiratory failure Aspiration pneumonitis, pneumonia HCAP Loculated pleural effusion  Pulmonary consulted for respiratory failure and evaluation of abnormal CT. On review of CTA, the location of the loculated effusions in the right minor fissure and the left oblique fissure in the setting of bilateral pleural effusions likely represent volume overload. Doubt these findings represent empyema however if she does develop fever, worsening hypoxemia or clinical condition, would consider repeat CT at that time.  Initially improved with diuresis however suspect new aspiration event today  Plan: --Continue broad spectrum antibiotics: Cefepime and Linezolid --Obtain trach aspirate --Wean oxygen requirement --Hold diuresis today --Continue bronchodilators: Duonebs TID and Pulmicort BID --Routine trach care --Consider CT chest when renal function improves  Atrial Fibrillation  Worsening tachycardia the reason for initial EMS call from [facility], not improved with Metoprolol, not on anti-coagulation for unclear reasons. Echo with normal EF, mod elevated PASP P: -Management and anticoagulation per Cardiology  Pulmonary will continue to follow  Best practice (evaluated daily)  Diet: per primary Pain/Anxiety/Delirium protocol (if indicated): n/a VAP protocol (if indicated): n/a DVT prophylaxis: Eliquis GI prophylaxis: n/a Glucose control: SSI Mobility: bed rest Disposition: progressive care  Goals of Care:  Last date of multidisciplinary goals of care discussion: per primary Family and staff present:  Summary of discussion:  Follow up goals of care discussion due:  Code Status: full code  Labs   CBC: Recent Labs  Lab 04/15/20 1253 04/16/20 0132 04/17/20 0231 04/18/20 0418 04/19/20 0156  WBC 6.9  8.0 6.4 7.7 10.5  NEUTROABS 4.9  --   --   --   --   HGB 8.1* 8.1* 7.9* 7.4* 7.6*  HCT 27.2* 26.9* 26.7* 25.1* 24.8*  MCV 95.4 94.1 96.0 94.7 95.0  PLT 225 242 221 234 [id number]    Basic Metabolic Panel: Recent Labs  Lab 04/15/20 1253 04/16/20 0132 04/17/20 0231 04/18/20 0418 04/19/20 0156  NA 135 138 139 137 133*  K 4.5 3.6 3.1* 3.6 4.4  CL 97* 101 104 103 101  CO2 '27 26 24 24 '$ 20*  GLUCOSE 141* 149* 104* 240* 452*  BUN 23 23 27* 37* 46*  CREATININE 0.96 1.20* 1.42* 2.21* 3.07*  CALCIUM 8.8* 8.7* 8.4* 8.2* 8.1*   GFR: Estimated Creatinine Clearance: 16.7 mL/min (A) (by C-G formula based on SCr of 3.07 mg/dL (H)).  Care time: 36 minutes    Rodman Pickle, M.D. [facility] Pulmonary/Critical Care Medicine 04/19/2020 11:31 AM   Please see Amion for pager number to reach on-call Pulmonary and Critical Care Team.

## 2020-04-19 NOTE — Progress Notes (Addendum)
PROGRESS NOTE    Rebecca Harrell  [id number] DOB: 09-Apr-1940 DOA: 04/15/2020 PCP: Aaron Edelman, MD   Brief Narrative: 79/F chronically ill with history of CVA, right hemiplegia, right AKA, seizure disorder, chronic respiratory failure status post trach and PEG, paroxysmal atrial fibrillation recently taken off anticoagulation and currently undergoing treatment for pneumonia at SNF presented to the ED from [facility] with rapid atrial fibrillation -ED course: Noted to be afebrile, sats in mid 90s on 8 L of oxygen supplementation, she was tachypneic in the mid 30s, tachycardic heart rate 130s.  EKG showed A. fib RVR rate 140.  CTA chest was negative for PE but notable for pulmonary edema and a small bilateral pleural effusion as well as loculated effusion within the right minor fissure, patchy bibasilar airspace opacity.  CT abdomen was negative for acute finding and demonstrated percutaneous gastrostomy tube in appropriate position. -Started on Cardizem drip -PCCM and cardiology following -developed tenous resp status and AKI   Assessment & Plan:   A. fib with RVR: -Patient with history of PAF no longer on anticoagulation for unclear reason presented with persistent rapid A. fib  -Appreciate cardiology input, currently on p.o. Cardizem, heart rate remains elevated  -Also started on Eliquis  Acute on chronic hypoxic respiratory failure, secondary to pneumonia and acute on chronic diastolic CHF Chronic tracheostomy -CTA negative for PE with concern for pulmonary edema, small loculated effusion and possible infection lower lung. -Appreciate PCCM input, suspected to be fluid related and less likely empyema  -Continue empiric cefepime and azithromycin for 5 days  -Diuretics on hold in the setting of AKI  -Repeat CT chest without contrast in a few days if stable  Acute kidney injury -Creatinine has trended up from 0.9 up to 3.0 today, could be cardiorenal from Afib/RVR poor  perfusion, Contrast- hadCTA chest on admission, continue to hold diuretics today, also received Vanc for 2days initially -Bladder scan without retention -Renal ultrasound without hydro, UA unremarkable, CK ok -d/w RN for strict I/Os -gentle IVF today, may need blood tomorrow -albumin is 2.3, so component of third spacing too  History of CVA: -Residual right hemiparesis and dysphagia, trach and PEG dependent -Restarted Eliquis in the setting of A. fib, hold aspirin -PEG tube for nutrition, continue tube feeds  Seizure disorder:  -Continue Keppra  Hypertension:  -Blood pressures stable, holding Norvasc and hydralazine  Insulin-dependent diabetes:  -CBGs very high today after increasing TFs yesterday, increase Levemir and add Novolog Q4 and SSI  Acute on chronic anemia  -anemia panel suggests chronic dz, no overt bleeding noted  -Monitor hemoglobin while on Eliquis  -will need transfusion if Hb trends lower  VRE UTI  -UA with large leukocyte . urine growing more than 100,000 colonies of Enterococcus vancomycin resistant.  Dr. Tyrell Antonio discussed case with ID, plan to treat with linezolid for 3 days.   ETHICS: very poor prognosis recommended comfort focused care to daughter and against resuscitation and dialysis if she doesn't improve, daughter struggling with this discussion based on pts previously expressed wishes many years ago, will consult Palliative care   Estimated body mass index is 27.66 kg/m as calculated from the following:   Height as of this encounter: '5\' 7"'$  (1.702 m).   Weight as of this encounter: 80.1 kg.   DVT prophylaxis: Eliquis Code Status: Full code Family Communication: no family at bedside, updated daughter 3/16, 3/17 Disposition Plan:  Status is: Inpatient  Remains inpatient appropriate because:IV treatments appropriate due to intensity of illness or  inability to take PO   Dispo: The patient is from: SNF              Anticipated d/c is to: SNF               Patient currently is not medically stable to d/c.   Difficult to place patient No   Consultants:   Cardiology  CCM  Procedures:   Echo  Antimicrobials:  Cefepime, azithromycin and vancomycin  Subjective: -no events overnight, tolerating TFs  Objective: Vitals:   04/19/20 0621 04/19/20 0734 04/19/20 0832 04/19/20 1149  BP: 117/76  105/62   Pulse: (!) 114 (!) 117 (!) 108 (!) 119  Resp: (!) 28 (!) 25 (!) 25 (!) 28  Temp: 98.9 F (37.2 C)  98.1 F (36.7 C)   TempSrc: Axillary  Axillary   SpO2:  98% 98%   Weight:      Height:       No intake or output data in the 24 hours ending 04/19/20 1405 Filed Weights   04/16/20 0339 04/18/20 0300 04/19/20 0433  Weight: 78.6 kg 78.5 kg 80.1 kg    Examination:  General exam: Chronically ill elderly female sitting up in bed, eyes open, poorly responsive HEENT: Tracheostomy with trach collar CVS: S1-S2, irregularly irregular rhythm Lungs: Decreased breath sounds the bases, few scattered rhonchi Abdomen: Soft nontender, bowel sounds present Extremities: No edema, right above-knee amputation Neuro: Right hemiplegia, right arm is contracted   Data Reviewed: I have personally reviewed following labs and imaging studies  CBC: Recent Labs  Lab 04/15/20 1253 04/16/20 0132 04/17/20 0231 04/18/20 0418 04/19/20 0156  WBC 6.9 8.0 6.4 7.7 10.5  NEUTROABS 4.9  --   --   --   --   HGB 8.1* 8.1* 7.9* 7.4* 7.6*  HCT 27.2* 26.9* 26.7* 25.1* 24.8*  MCV 95.4 94.1 96.0 94.7 95.0  PLT 225 242 221 234 [id number]   Basic Metabolic Panel: Recent Labs  Lab 04/15/20 1253 04/16/20 0132 04/17/20 0231 04/18/20 0418 04/19/20 0156  NA 135 138 139 137 133*  K 4.5 3.6 3.1* 3.6 4.4  CL 97* 101 104 103 101  CO2 '27 26 24 24 '$ 20*  GLUCOSE 141* 149* 104* 240* 452*  BUN 23 23 27* 37* 46*  CREATININE 0.96 1.20* 1.42* 2.21* 3.07*  CALCIUM 8.8* 8.7* 8.4* 8.2* 8.1*   GFR: Estimated Creatinine Clearance: 16.7 mL/min (A) (by C-G formula based on  SCr of 3.07 mg/dL (H)). Liver Function Tests: Recent Labs  Lab 04/15/20 1253  AST 58*  ALT 25  ALKPHOS 75  BILITOT 1.1  PROT 8.0  ALBUMIN 2.3*   No results for input(s): LIPASE, AMYLASE in the last 168 hours. No results for input(s): AMMONIA in the last 168 hours. Coagulation Profile: Recent Labs  Lab 04/15/20 1253  INR 1.2   Cardiac Enzymes: Recent Labs  Lab 04/18/20 1500  CKTOTAL 28*   BNP (last 3 results) No results for input(s): PROBNP in the last 8760 hours. HbA1C: No results for input(s): HGBA1C in the last 72 hours. CBG: Recent Labs  Lab 04/18/20 1947 04/19/20 0042 04/19/20 0432 04/19/20 0734 04/19/20 1139  GLUCAP 356* 425* 410* 374* 337*   Lipid Profile: No results for input(s): CHOL, HDL, LDLCALC, TRIG, CHOLHDL, LDLDIRECT in the last 72 hours. Thyroid Function Tests: No results for input(s): TSH, T4TOTAL, FREET4, T3FREE, THYROIDAB in the last 72 hours. Anemia Panel: Recent Labs    04/18/20 0418 04/18/20 1500  VITAMINB12  --  558  FOLATE  --  23.3  FERRITIN  --  245  TIBC  --  298  IRON  --  56  RETICCTPCT 4.2*  --    Sepsis Labs: Recent Labs  Lab 04/15/20 1253 04/16/20 0132 04/17/20 0231  PROCALCITON  --  <0.10 0.13  LATICACIDVEN 0.9  --   --     Recent Results (from the past 240 hour(s))  Blood culture (routine single)     Status: None (Preliminary result)   Collection Time: 04/15/20 12:53 PM   Specimen: BLOOD RIGHT FOREARM  Result Value Ref Range Status   Specimen Description BLOOD RIGHT FOREARM  Final   Special Requests   Final    BOTTLES DRAWN AEROBIC AND ANAEROBIC Blood Culture adequate volume   Culture   Final    NO GROWTH 4 DAYS Performed at [facility], [place] [address] [place], [place] [address]    Report Status PENDING  Incomplete  Resp Panel by RT-PCR (Flu A&B, Covid) Nasopharyngeal Swab     Status: None   Collection Time: 04/15/20 12:55 PM   Specimen: Nasopharyngeal Swab; Nasopharyngeal(NP) swabs in vial  transport medium  Result Value Ref Range Status   SARS Coronavirus 2 by RT PCR NEGATIVE NEGATIVE Final    Comment: (NOTE) SARS-CoV-2 target nucleic acids are NOT DETECTED.  The SARS-CoV-2 RNA is generally detectable in upper respiratory specimens during the acute phase of infection. The lowest concentration of SARS-CoV-2 viral copies this assay can detect is 138 copies/mL. A negative result does not preclude SARS-Cov-2 infection and should not be used as the sole basis for treatment or other patient management decisions. A negative result may occur with  improper specimen collection/handling, submission of specimen other than nasopharyngeal swab, presence of viral mutation(s) within the areas targeted by this assay, and inadequate number of viral copies(<138 copies/mL). A negative result must be combined with clinical observations, patient history, and epidemiological information. The expected result is Negative.  Fact Sheet for Patients:  EntrepreneurPulse.com.au  Fact Sheet for Healthcare Providers:  IncredibleEmployment.be  This test is no t yet approved or cleared by the [place] FDA and  has been authorized for detection and/or diagnosis of SARS-CoV-2 by FDA under an Emergency Use Authorization (EUA). This EUA will remain  in effect (meaning this test can be used) for the duration of the COVID-19 declaration under Section 564(b)(1) of the Act, 21 U.S.C.section 360bbb-3(b)(1), unless the authorization is terminated  or revoked sooner.       Influenza A by PCR NEGATIVE NEGATIVE Final   Influenza B by PCR NEGATIVE NEGATIVE Final    Comment: (NOTE) The Xpert Xpress SARS-CoV-2/FLU/RSV plus assay is intended as an aid in the diagnosis of influenza from Nasopharyngeal swab specimens and should not be used as a sole basis for treatment. Nasal washings and aspirates are unacceptable for Xpert Xpress SARS-CoV-2/FLU/RSV testing.  Fact  Sheet for Patients: EntrepreneurPulse.com.au  Fact Sheet for Healthcare Providers: IncredibleEmployment.be  This test is not yet approved or cleared by the [place] FDA and has been authorized for detection and/or diagnosis of SARS-CoV-2 by FDA under an Emergency Use Authorization (EUA). This EUA will remain in effect (meaning this test can be used) for the duration of the COVID-19 declaration under Section 564(b)(1) of the Act, 21 U.S.C. section 360bbb-3(b)(1), unless the authorization is terminated or revoked.  Performed at [facility], [place] [address], [place], [place] [address]   Urine culture     Status: Abnormal  Collection Time: 04/15/20  3:24 PM   Specimen: In/Out Cath Urine  Result Value Ref Range Status   Specimen Description IN/OUT CATH URINE  Final   Special Requests   Final    NONE Performed at [facility], [address], [place], [place] [address]    Culture (A)  Final    >=100,000 COLONIES/mL VANCOMYCIN RESISTANT ENTEROCOCCUS ISOLATED   Report Status 04/17/2020 FINAL  Final   Organism ID, Bacteria VANCOMYCIN RESISTANT ENTEROCOCCUS ISOLATED (A)  Final      Susceptibility   Vancomycin resistant enterococcus isolated - MIC*    AMPICILLIN >=32 RESISTANT Resistant     NITROFURANTOIN 64 INTERMEDIATE Intermediate     VANCOMYCIN >=32 RESISTANT Resistant     LINEZOLID 2 SENSITIVE Sensitive     * >=100,000 COLONIES/mL VANCOMYCIN RESISTANT ENTEROCOCCUS ISOLATED  MRSA PCR Screening     Status: None   Collection Time: 04/16/20  4:02 AM   Specimen: Nasal Mucosa; Nasopharyngeal  Result Value Ref Range Status   MRSA by PCR NEGATIVE NEGATIVE Final    Comment:        The GeneXpert MRSA Assay (FDA approved for NASAL specimens only), is one component of a comprehensive MRSA colonization surveillance program. It is not intended to diagnose MRSA infection nor to guide or monitor treatment for MRSA  infections. Performed at [facility], [place] [address], [place], [place] [address]          Radiology Studies: US RENAL  Result Date: 04/18/2020 CLINICAL DATA:  Acute renal injury. EXAM: RENAL / URINARY TRACT ULTRASOUND COMPLETE COMPARISON:  CT abdomen pelvis 04/15/2020 FINDINGS: Right Kidney: Renal measurements: 10.5 x 3.9 x 4.5 cm = volume: 96 mL. Echogenicity within normal limits. No mass or hydronephrosis visualized. Left Kidney: Renal measurements: 9.3 x 4.8 x 4.1 cm = volume: 94 mL. Echogenicity within normal limits. No mass or hydronephrosis visualized. Bladder: Appears normal for degree of bladder distention. Other: Gallstones noted within the gallbladder lumen. At least trace simple free fluid noted within the pelvis. IMPRESSION: 1. Unremarkable renal ultrasound. 2. Cholelithiasis. 3. At least trace simple free fluid within the pelvis. Electronically Signed   By: Iven Finn M.D.   On: 04/18/2020 23:04    Scheduled Meds: . apixaban  5 mg Per Tube BID  . budesonide (PULMICORT) nebulizer solution  0.25 mg Nebulization BID  . chlorhexidine  15 mL Mouth Rinse BID  . Chlorhexidine Gluconate Cloth  6 each Topical Daily  . diltiazem  120 mg Per Tube Q6H  . feeding supplement (PROSource TF)  45 mL Per Tube Daily  . insulin aspart  0-6 Units Subcutaneous Q4H  . insulin aspart  4 Units Subcutaneous Q4H  . insulin detemir  12 Units Subcutaneous BID  . ipratropium-albuterol  3 mL Nebulization TID  . levETIRAcetam  750 mg Per Tube BID  . mouth rinse  15 mL Mouth Rinse q12n4p  . metoprolol tartrate  12.5 mg Per Tube Q6H  . pantoprazole (PROTONIX) IV  40 mg Intravenous Daily  . sodium chloride flush  10-40 mL Intracatheter Q12H   Continuous Infusions: . sodium chloride 75 mL/hr at 04/19/20 0838  . azithromycin 500 mg (04/18/20 2308)  . ceFEPime (MAXIPIME) IV 2 g (04/19/20 1147)  . feeding supplement (OSMOLITE 1.5 CAL) 55 mL/hr at 04/18/20 1100  . linezolid (ZYVOX) IV 600 mg  (04/19/20 1142)     LOS: 4 days    Time spent: 35 minutes.     Domenic Polite,  MD [facility]  04/19/2020, 2:05 PM

## 2020-04-19 NOTE — Care Management Important Message (Signed)
Important Message  Patient Details  Name: Rebecca Harrell MRN: [id number] Date of Birth: 01/31/1941   Medicare Important Message Given:  Yes     Shelda Altes 04/19/2020, 11:32 AM

## 2020-04-19 NOTE — Progress Notes (Signed)
Responded to unit page to support patient and family at bedside.  Patient not able to communicate but family wanted prayer for patient and themselves.  Prayer ws done.  Daughter wanted to know where [facility] was located.  I escorted her to [place].  Chaplain will follow as needed.  Jaclynn Major, [place], [facility], Pager [phone]

## 2020-04-19 NOTE — Progress Notes (Signed)
Progress Note  Patient Name: Rebecca Harrell Date of Encounter: 04/19/2020  Primary Cardiologist: New to [facility]  Subjective   Overnight has rate related BBB.  Increased diltiazem further. Daughter is here today and has many questions to discuss.  Inpatient Medications    Scheduled Meds: . apixaban  5 mg Per Tube BID  . budesonide (PULMICORT) nebulizer solution  0.25 mg Nebulization BID  . chlorhexidine  15 mL Mouth Rinse BID  . Chlorhexidine Gluconate Cloth  6 each Topical Daily  . diltiazem  120 mg Per Tube Q6H  . feeding supplement (PROSource TF)  45 mL Per Tube Daily  . insulin aspart  0-6 Units Subcutaneous Q4H  . insulin detemir  8 Units Subcutaneous BID  . ipratropium-albuterol  3 mL Nebulization TID  . levETIRAcetam  750 mg Per Tube BID  . mouth rinse  15 mL Mouth Rinse q12n4p  . metoprolol tartrate  12.5 mg Per Tube Q6H  . pantoprazole (PROTONIX) IV  40 mg Intravenous Daily  . sodium chloride flush  10-40 mL Intracatheter Q12H   Continuous Infusions: . sodium chloride 75 mL/hr at 04/19/20 0838  . azithromycin 500 mg (04/18/20 2308)  . ceFEPime (MAXIPIME) IV 2 g (04/19/20 1147)  . feeding supplement (OSMOLITE 1.5 CAL) 55 mL/hr at 04/18/20 1100  . linezolid (ZYVOX) IV 600 mg (04/19/20 1142)   PRN Meds: acetaminophen, morphine injection, ondansetron (ZOFRAN) IV, sodium chloride flush   Vital Signs    Vitals:   04/19/20 0621 04/19/20 0734 04/19/20 0832 04/19/20 1149  BP: 117/76  105/62   Pulse: (!) 114 (!) 117 (!) 108 (!) 119  Resp: (!) 28 (!) 25 (!) 25 (!) 28  Temp: 98.9 F (37.2 C)  98.1 F (36.7 C)   TempSrc: Axillary  Axillary   SpO2:  98% 98%   Weight:      Height:       No intake or output data in the 24 hours ending 04/19/20 1314 Filed Weights   04/16/20 0339 04/18/20 0300 04/19/20 0433  Weight: 78.6 kg 78.5 kg 80.1 kg    Physical Exam   General: Elderly, NAD Lungs:Clear to ausculation bilaterally. Cardiovascular: Irregularly irregular. No  murmurs Abdomen: Soft, non-tender, non-distended. No obvious abdominal masses. No tendered at feeding tube site Extremities: No edema. Radial pulses 2+ bilaterally R stump site c/d/i; left pressure ulcer present on admission stable Neuro: No head nod today but opens eyes spontaneously  Labs    Chemistry Recent Labs  Lab 04/15/20 1253 04/16/20 0132 04/17/20 0231 04/18/20 0418 04/19/20 0156  NA 135   < > 139 137 133*  K 4.5   < > 3.1* 3.6 4.4  CL 97*   < > 104 103 101  CO2 27   < > 24 24 20*  GLUCOSE 141*   < > 104* 240* 452*  BUN 23   < > 27* 37* 46*  CREATININE 0.96   < > 1.42* 2.21* 3.07*  CALCIUM 8.8*   < > 8.4* 8.2* 8.1*  PROT 8.0  --   --   --   --   ALBUMIN 2.3*  --   --   --   --   AST 58*  --   --   --   --   ALT 25  --   --   --   --   ALKPHOS 75  --   --   --   --   BILITOT 1.1  --   --   --   --  GFRNONAA >60   < > 38* 22* 15*  ANIONGAP 11   < > '11 10 12   '$ < > = values in this interval not displayed.     Hematology Recent Labs  Lab 04/17/20 0231 04/18/20 0418 04/19/20 0156  WBC 6.4 7.7 10.5  RBC 2.78* 2.65*  2.64* 2.61*  HGB 7.9* 7.4* 7.6*  HCT 26.7* 25.1* 24.8*  MCV 96.0 94.7 95.0  MCH 28.4 27.9 29.1  MCHC 29.6* 29.5* 30.6  RDW 16.1* 16.3* 16.4*  PLT 221 234 239    Cardiac EnzymesNo results for input(s): TROPONINI in the last 168 hours. No results for input(s): TROPIPOC in the last 168 hours.   BNPNo results for input(s): BNP, PROBNP in the last 168 hours.   DDimer No results for input(s): DDIMER in the last 168 hours.   Radiology    US RENAL  Result Date: 04/18/2020 CLINICAL DATA:  Acute renal injury. EXAM: RENAL / URINARY TRACT ULTRASOUND COMPLETE COMPARISON:  CT abdomen pelvis 04/15/2020 FINDINGS: Right Kidney: Renal measurements: 10.5 x 3.9 x 4.5 cm = volume: 96 mL. Echogenicity within normal limits. No mass or hydronephrosis visualized. Left Kidney: Renal measurements: 9.3 x 4.8 x 4.1 cm = volume: 94 mL. Echogenicity within normal limits.  No mass or hydronephrosis visualized. Bladder: Appears normal for degree of bladder distention. Other: Gallstones noted within the gallbladder lumen. At least trace simple free fluid noted within the pelvis. IMPRESSION: 1. Unremarkable renal ultrasound. 2. Cholelithiasis. 3. At least trace simple free fluid within the pelvis. Electronically Signed   By: Iven Finn M.D.   On: 04/18/2020 23:04   Telemetry    AF with rates in the 120s - Personally Reviewed  ECG    AF 117 with new LBBB - Personally Reviewed  Cardiac Studies   Echocardiogram:   Result date: 12/07/08 No evidence of intra-cardiac shunting by agitated IV saline contrast  No evidence of vegetation.  Mild concentric left ventricular hypertrophy is observed.  Global left ventricular wall motion and contractility are within normal  limits.  The ejection fraction is estimated to be 65%.  Evidence of mild mitral annular calcification.  There is mild dilatation of the ascending aorta.  Rhythm AF   Patient Profile     80 y.o. female with a hx of PAF, DM2, HTN, seizures, HFpEF, CVA with residual R sided deficits, GERD and trach who presented from [place] [facility] ongoing AF/RVRsince Friday.  Assessment & Plan    Atrial fibrillation with RVR: Malnutrition with feeding tube -Hx of PAF dating back to 2010 not on anticoagulation for unclear reasons per record review who presented to [facility] from [facility] with persistent AF with rates in the 125-150 range. -Continue Eliquis given elevated CHA2DS2VASc 7 (age x2, CVA x2, DM, HTN, gender) -diltiazem now at 120 mg q6HR; adding metoprolol -Echo performed 04/16/20 with LVEF at 55-60%, no RWMA, mod elevated PA pressures, mild to mod MR, mild AR and aortic dilatation at 6m     HTN: -Hypotensive on admission which responded to IVF  - has tolerated AV nodal agents without issue  Hx of CVA: -Residual hemiparesis and dysphagia s/p tracheostomy and PEG  -Started on  Eliquis this admission   Acute on chronic hypoxic respiratory failure failure: -s/p trach>>>PCCM consulted -Management per IM and PCCM   DM2: -SSI for glucose control -Per IM   Discussed AF care at length with daughter who has no further questions.  For questions or updates, please contact   Please  consult [email or web address] for contact info under Cardiology/STEMI.  Rudean Haskell, MD [address] [place], [place] [address] [phone]  1:14 PM

## 2020-04-19 NOTE — Consult Note (Signed)
Reason for Consult: Acute kidney injury Referring Physician: Domenic Polite, MD [facility])  HPI: (Patient nonverbal and history obtained from chart as there is NOK around) 80 year old African-American woman with past medical history significant for insulin-dependent diabetes mellitus, HFpEF, CVA with right-sided hemiparesis/contracture, status post right AKA, history of chronic hypoxic respiratory failure status post tracheostomy, status post PEG tube placement for nutrition and history of paroxysmal atrial fibrillation.  She was recently at [facility] where she was being treated for HCAP with Zosyn and developed atrial fibrillation with RVR/hypotension for which she was transferred and evaluated in the emergency room here on 3/13.  She was found to have no fever and hypotension improved with fluid bolus.  She had a CT angiogram of the chest that did not show PE but showed some pulmonary edema with small bilateral pleural effusions and a loculated pleural fluid accumulation in the right minor fissure.  She has had intermittent tachycardia without significant episodes of hypotension while here in the hospital and urine output has been minimal/not charted.  Concern is raised with her worsening renal function.  Renal ultrasound done yesterday did not show any obstruction and urinalysis done on admission showed some pyuria and dipstick hemoglobin but only 0-5 RBC/hpf.  She was treated with vancomycin earlier in hospitalization-no level available.    04/15/2020  04/16/2020  04/17/2020  04/18/2020  04/19/2020   BUN '23 23 27 '$ (H) 37 (H) 46 (H)  Creatinine 0.96 1.20 (H) 1.42 (H) 2.21 (H) 3.07 (H)    Past Medical History:  Diagnosis Date  . Diabetes mellitus without complication ([place])   . Dysrhythmia   . History of CVA (cerebrovascular accident) 04/15/2020  . Seizure disorder ([place]) 04/15/2020  . Stroke [place] [facility])     Past Surgical History:  Procedure Laterality Date  . BELOW KNEE LEG AMPUTATION    . PEG TUBE  PLACEMENT    . TRACHEOSTOMY      Family History  Problem Relation Age of Onset  . Diabetes type II Maternal Grandmother   . Hypertension Maternal Grandmother   . Macular degeneration Maternal Grandmother   . Diabetes type II Maternal Grandfather   . Hypertension Maternal Grandfather     Social History:  reports that she has never smoked. She has never used smokeless tobacco. She reports previous alcohol use. No history on file for drug use.  Allergies:  Allergies  Allergen Reactions  . Quinolones     UNK reaction    Medications:  Scheduled: . apixaban  5 mg Per Tube BID  . budesonide (PULMICORT) nebulizer solution  0.25 mg Nebulization BID  . chlorhexidine  15 mL Mouth Rinse BID  . Chlorhexidine Gluconate Cloth  6 each Topical Daily  . diltiazem  120 mg Per Tube Q6H  . feeding supplement (PROSource TF)  45 mL Per Tube Daily  . insulin aspart  0-6 Units Subcutaneous Q4H  . insulin aspart  4 Units Subcutaneous Q4H  . insulin detemir  12 Units Subcutaneous BID  . ipratropium-albuterol  3 mL Nebulization TID  . levETIRAcetam  750 mg Per Tube BID  . mouth rinse  15 mL Mouth Rinse q12n4p  . metoprolol tartrate  12.5 mg Per Tube Q6H  . pantoprazole (PROTONIX) IV  40 mg Intravenous Daily  . sodium chloride flush  10-40 mL Intracatheter Q12H    BMP Latest Ref Rng & Units 04/19/2020 04/18/2020 04/17/2020  Glucose 70 - 99 mg/dL 452(H) 240(H) 104(H)  BUN 8 - 23 mg/dL 46(H) 37(H) 27(H)  Creatinine  0.44 - 1.00 mg/dL 3.07(H) 2.21(H) 1.42(H)  Sodium 135 - 145 mmol/L 133(L) 137 139  Potassium 3.5 - 5.1 mmol/L 4.4 3.6 3.1(L)  Chloride 98 - 111 mmol/L 101 103 104  CO2 22 - 32 mmol/L 20(L) 24 24  Calcium 8.9 - 10.3 mg/dL 8.1(L) 8.2(L) 8.4(L)   CBC Latest Ref Rng & Units 04/19/2020 04/18/2020 04/17/2020  WBC 4.0 - 10.5 K/uL 10.5 7.7 6.4  Hemoglobin 12.0 - 15.0 g/dL 7.6(L) 7.4(L) 7.9(L)  Hematocrit 36.0 - 46.0 % 24.8(L) 25.1(L) 26.7(L)  Platelets 150 - 400 K/uL 239 234 221     US  RENAL  Result Date: 04/18/2020 CLINICAL DATA:  Acute renal injury. EXAM: RENAL / URINARY TRACT ULTRASOUND COMPLETE COMPARISON:  CT abdomen pelvis 04/15/2020 FINDINGS: Right Kidney: Renal measurements: 10.5 x 3.9 x 4.5 cm = volume: 96 mL. Echogenicity within normal limits. No mass or hydronephrosis visualized. Left Kidney: Renal measurements: 9.3 x 4.8 x 4.1 cm = volume: 94 mL. Echogenicity within normal limits. No mass or hydronephrosis visualized. Bladder: Appears normal for degree of bladder distention. Other: Gallstones noted within the gallbladder lumen. At least trace simple free fluid noted within the pelvis. IMPRESSION: 1. Unremarkable renal ultrasound. 2. Cholelithiasis. 3. At least trace simple free fluid within the pelvis. Electronically Signed   By: Iven Finn M.D.   On: 04/18/2020 23:04    Review of Systems  Unable to perform ROS: Patient nonverbal   Blood pressure 105/62, pulse (!) 119, temperature 98.1 F (36.7 C), temperature source Axillary, resp. rate (!) 28, height '5\' 7"'$  (1.702 m), weight 80.1 kg, SpO2 98 %. Physical Exam Vitals and nursing note reviewed.  Constitutional:      Appearance: She is ill-appearing and diaphoretic.     Comments: When seen, she appears to be unresponsive with fixed gaze and intermittent dyskinesia-like jaw movement  HENT:     Head: Normocephalic and atraumatic.     Right Ear: External ear normal.     Left Ear: External ear normal.     Nose: Nose normal.     Mouth/Throat:     Mouth: Mucous membranes are dry.  Eyes:     Conjunctiva/sclera: Conjunctivae normal.  Neck:     Comments: Tracheostomy in place Cardiovascular:     Rate and Rhythm: Tachycardia present. Rhythm irregular.     Heart sounds: Normal heart sounds. No murmur heard.   Pulmonary:     Breath sounds: No wheezing or rhonchi.     Comments: Coarse breath sounds/appear transmitted Abdominal:     General: Abdomen is flat. Bowel sounds are normal.     Palpations: Abdomen is  soft.     Tenderness: There is no guarding.     Comments: PEG tube in situ  Musculoskeletal:     Cervical back: Neck supple.     Left lower leg: No edema.     Comments: Left leg without edema.  Contracture toes with sarcopenia noted   She just returned back from having a chest x-ray and was noted to have some changes in her mental status/interaction and diaphoresis.  Assessment/Plan: 1.  Acute kidney injury: I suspect that this might be hemodynamically mediated acute kidney injury in the setting of atrial fibrillation with rapid ventricular response compounded by iodinated intravenous contrast exposure.  She appears to be anuric at this time and I have requested for bladder scan plus/minus application of pure wick catheter for strict input and output monitoring now that she is on intravenous fluids.  I support  a time-limited trial of intravenous fluids with close monitoring given initial evidence of acute exacerbation of diastolic heart failure on exam.  I would not offer hemodialysis with her limited functional status at baseline and the likelihood that this would further compromise her overall quality of life.  I will send off for urine electrolytes and urinalysis again along with checking a random vancomycin level. Avoid nephrotoxic medications including NSAIDs and iodinated intravenous contrast exposure unless the latter is absolutely indicated.  Preferred narcotic agents for pain control are hydromorphone, fentanyl, and methadone. Morphine should not be used. Avoid Baclofen and avoid oral sodium phosphate and magnesium citrate based laxatives / bowel preps. Continue strict Input and Output monitoring. Will monitor the patient closely with you and intervene or adjust therapy as indicated by changes in clinical status/labs. 2.  Atrial fibrillation with rapid ventricular response: Appears to have been provoked by recent healthcare associated pneumonia and likely to have resulted in acute exacerbation  of diastolic heart failure.  Cardiology assisting with management including the question of chronic anticoagulation-on Eliquis. 3.  Acute exacerbation of diastolic heart failure: Possibly related to initial presentation with atrial fibrillation rapid ventricular response and managed with treatment of underlying pneumonia and diuresis. 4.  Hypertension: Appears to have decent blood pressure control on diltiazem and metoprolol while atrial fibrillation rate control remains elusive. 5.  Acute on chronic hypoxic respiratory failure: Status post chronic tracheostomy with ongoing management by CCM.  On broad-spectrum antimicrobial therapy with cefepime and linezolid along with bronchodilators and tracheostomy care. 6.  Anemia of chronic disease: With low iron saturation noted along with low hemoglobin/hematocrit.  She appears to have been on an adequate duration of antibiotics to allow for intravenous iron/ESA.  PATEL,JAY K. 04/19/2020, 3:24 PM

## 2020-04-19 NOTE — Progress Notes (Signed)
°   04/18/20 0050  Provider Notification  Provider Name/Title Ander Slade  Date Provider Notified 04/18/20  Time Provider Notified 0045  Notification Type Page  Notification Reason Other (Comment) (CBG 425)  Provider response No new orders  Date of Provider Response 04/19/20  Time of Provider Response [phone]

## 2020-04-19 NOTE — Progress Notes (Addendum)
Foley placed at 1627.  Minimal urine output(only small amount in tube, none in bag).

## 2020-04-19 NOTE — Progress Notes (Signed)
Sputum sent to lab

## 2020-04-19 NOTE — Progress Notes (Signed)
   04/19/20 0440  Provider Notification  Provider Name/Title Ander Slade  Date Provider Notified 04/19/20  Time Provider Notified [phone]  Notification Type Page  Notification Reason Other (Comment) (CBG 410)  Provider response See new orders  Date of Provider Response 04/19/20  Time of Provider Response [phone]

## 2020-04-19 NOTE — Progress Notes (Signed)
Inpatient Diabetes Program Recommendations  AACE/ADA: New Consensus Statement on Inpatient Glycemic Control (2015)  Target Ranges:  Prepandial:   less than 140 mg/dL      Peak postprandial:   less than 180 mg/dL (1-2 hours)      Critically ill patients:  140 - 180 mg/dL   Lab Results  Component Value Date   GLUCAP 337 (H) 04/19/2020   HGBA1C 7.0 (H) 04/16/2020    Review of Glycemic Control Results for Rebecca Harrell, Rebecca Harrell (MRN [id number]) as of 04/19/2020 13:08  Ref. Range 04/18/2020 19:47 04/19/2020 00:42 04/19/2020 04:32 04/19/2020 07:34 04/19/2020 11:39  Glucose-Capillary Latest Ref Range: 70 - 99 mg/dL 356 (H) 425 (H) 410 (H) 374 (H) 337 (H)  Diabetes history: DM 2 Outpatient Diabetes medications:  Lantus 20 units q HS Novolin R- 2-10 units q 6 hours Current orders for Inpatient glycemic control:  Novolog 0-6 units q 4 hours Levemir 8 units bid Osmolite 55 cc/hr  Inpatient Diabetes Program Recommendations:   Consider increasing Levemir 12 units bid and add Novolog tube feed coverage 3 units q 4 hours.   Thanks  Adah Perl, RN, BC-ADM Inpatient Diabetes Coordinator Pager [phone] (8a-5p)

## 2020-04-20 ENCOUNTER — Inpatient Hospital Stay (HOSPITAL_COMMUNITY): Payer: Medicare Other

## 2020-04-20 DIAGNOSIS — N179 Acute kidney failure, unspecified: Secondary | ICD-10-CM | POA: Diagnosis not present

## 2020-04-20 DIAGNOSIS — J690 Pneumonitis due to inhalation of food and vomit: Secondary | ICD-10-CM

## 2020-04-20 DIAGNOSIS — R0603 Acute respiratory distress: Secondary | ICD-10-CM

## 2020-04-20 DIAGNOSIS — Z515 Encounter for palliative care: Secondary | ICD-10-CM | POA: Diagnosis not present

## 2020-04-20 DIAGNOSIS — J189 Pneumonia, unspecified organism: Secondary | ICD-10-CM | POA: Diagnosis not present

## 2020-04-20 DIAGNOSIS — Z930 Tracheostomy status: Secondary | ICD-10-CM | POA: Diagnosis not present

## 2020-04-20 DIAGNOSIS — Z7189 Other specified counseling: Secondary | ICD-10-CM | POA: Diagnosis not present

## 2020-04-20 DIAGNOSIS — I4891 Unspecified atrial fibrillation: Secondary | ICD-10-CM | POA: Diagnosis not present

## 2020-04-20 DIAGNOSIS — Z66 Do not resuscitate: Secondary | ICD-10-CM | POA: Diagnosis not present

## 2020-04-20 DIAGNOSIS — J9621 Acute and chronic respiratory failure with hypoxia: Secondary | ICD-10-CM

## 2020-04-20 DIAGNOSIS — L8990 Pressure ulcer of unspecified site, unspecified stage: Secondary | ICD-10-CM | POA: Insufficient documentation

## 2020-04-20 DIAGNOSIS — Z2239 Carrier of other specified bacterial diseases: Secondary | ICD-10-CM

## 2020-04-20 LAB — CULTURE, BLOOD (SINGLE)
Culture: NO GROWTH
Special Requests: ADEQUATE

## 2020-04-20 LAB — POCT I-STAT 7, (LYTES, BLD GAS, ICA,H+H)
Acid-base deficit: 7 mmol/L — ABNORMAL HIGH (ref 0.0–2.0)
Acid-base deficit: 1 mmol/L (ref 0.0–2.0)
Bicarbonate: 20.5 mmol/L (ref 20.0–28.0)
Bicarbonate: 25.3 mmol/L (ref 20.0–28.0)
Calcium, Ion: 1.06 mmol/L — ABNORMAL LOW (ref 1.15–1.40)
Calcium, Ion: 1.05 mmol/L — ABNORMAL LOW (ref 1.15–1.40)
HCT: 27 % — ABNORMAL LOW (ref 36.0–46.0)
HCT: 25 % — ABNORMAL LOW (ref 36.0–46.0)
Hemoglobin: 9.2 g/dL — ABNORMAL LOW (ref 12.0–15.0)
Hemoglobin: 8.5 g/dL — ABNORMAL LOW (ref 12.0–15.0)
O2 Saturation: 83 %
O2 Saturation: 100 %
Patient temperature: 97
Patient temperature: 37.1
Potassium: 5.2 mmol/L — ABNORMAL HIGH (ref 3.5–5.1)
Potassium: 4.9 mmol/L (ref 3.5–5.1)
Sodium: 136 mmol/L (ref 135–145)
Sodium: 137 mmol/L (ref 135–145)
TCO2: 22 mmol/L (ref 22–32)
TCO2: 27 mmol/L (ref 22–32)
pCO2 arterial: 49 mmHg — ABNORMAL HIGH (ref 32.0–48.0)
pCO2 arterial: 51.8 mmHg — ABNORMAL HIGH (ref 32.0–48.0)
pH, Arterial: 7.225 — ABNORMAL LOW (ref 7.350–7.450)
pH, Arterial: 7.297 — ABNORMAL LOW (ref 7.350–7.450)
pO2, Arterial: 54 mmHg — ABNORMAL LOW (ref 83.0–108.0)
pO2, Arterial: 335 mmHg — ABNORMAL HIGH (ref 83.0–108.0)

## 2020-04-20 LAB — CBC
HCT: 26 % — ABNORMAL LOW (ref 36.0–46.0)
Hemoglobin: 7.3 g/dL — ABNORMAL LOW (ref 12.0–15.0)
MCH: 28.2 pg (ref 26.0–34.0)
MCHC: 28.1 g/dL — ABNORMAL LOW (ref 30.0–36.0)
MCV: 100.4 fL — ABNORMAL HIGH (ref 80.0–100.0)
Platelets: 300 10*3/uL (ref 150–400)
RBC: 2.59 MIL/uL — ABNORMAL LOW (ref 3.87–5.11)
RDW: 16.8 % — ABNORMAL HIGH (ref 11.5–15.5)
WBC: 14.6 10*3/uL — ABNORMAL HIGH (ref 4.0–10.5)
nRBC: 0.5 % — ABNORMAL HIGH (ref 0.0–0.2)

## 2020-04-20 LAB — RENAL FUNCTION PANEL
Albumin: 2.3 g/dL — ABNORMAL LOW (ref 3.5–5.0)
Anion gap: 16 — ABNORMAL HIGH (ref 5–15)
BUN: 56 mg/dL — ABNORMAL HIGH (ref 8–23)
CO2: 17 mmol/L — ABNORMAL LOW (ref 22–32)
Calcium: 7.9 mg/dL — ABNORMAL LOW (ref 8.9–10.3)
Chloride: 98 mmol/L (ref 98–111)
Creatinine, Ser: 3.95 mg/dL — ABNORMAL HIGH (ref 0.44–1.00)
GFR, Estimated: 11 mL/min — ABNORMAL LOW (ref >60)
Glucose, Bld: 492 mg/dL — ABNORMAL HIGH (ref 70–99)
Phosphorus: 6.9 mg/dL — ABNORMAL HIGH (ref 2.5–4.6)
Potassium: 5.9 mmol/L — ABNORMAL HIGH (ref 3.5–5.1)
Sodium: 131 mmol/L — ABNORMAL LOW (ref 135–145)

## 2020-04-20 LAB — BLOOD GAS, ARTERIAL
Acid-base deficit: 12.9 mmol/L — ABNORMAL HIGH (ref 0.0–2.0)
Bicarbonate: 15.3 mmol/L — ABNORMAL LOW (ref 20.0–28.0)
FIO2: 100
O2 Saturation: 96.6 %
Patient temperature: 36.1
pCO2 arterial: 51.3 mmHg — ABNORMAL HIGH (ref 32.0–48.0)
pH, Arterial: 7.096 — CL (ref 7.350–7.450)
pO2, Arterial: 112 mmHg — ABNORMAL HIGH (ref 83.0–108.0)

## 2020-04-20 LAB — GLUCOSE, CAPILLARY
Glucose-Capillary: 116 mg/dL — ABNORMAL HIGH (ref 70–99)
Glucose-Capillary: 147 mg/dL — ABNORMAL HIGH (ref 70–99)
Glucose-Capillary: 251 mg/dL — ABNORMAL HIGH (ref 70–99)
Glucose-Capillary: 315 mg/dL — ABNORMAL HIGH (ref 70–99)
Glucose-Capillary: 358 mg/dL — ABNORMAL HIGH (ref 70–99)
Glucose-Capillary: 365 mg/dL — ABNORMAL HIGH (ref 70–99)
Glucose-Capillary: 398 mg/dL — ABNORMAL HIGH (ref 70–99)
Glucose-Capillary: 406 mg/dL — ABNORMAL HIGH (ref 70–99)

## 2020-04-20 LAB — LACTIC ACID, PLASMA
Lactic Acid, Venous: 6.4 mmol/L (ref 0.5–1.9)
Lactic Acid, Venous: 2.9 mmol/L (ref 0.5–1.9)

## 2020-04-20 LAB — BRAIN NATRIURETIC PEPTIDE: B Natriuretic Peptide: 1070.8 pg/mL — ABNORMAL HIGH (ref 0.0–100.0)

## 2020-04-20 LAB — POTASSIUM
Potassium: 6 mmol/L — ABNORMAL HIGH (ref 3.5–5.1)
Potassium: 4.6 mmol/L (ref 3.5–5.1)
Potassium: 4.3 mmol/L (ref 3.5–5.1)

## 2020-04-20 LAB — TROPONIN I (HIGH SENSITIVITY): Troponin I (High Sensitivity): 59 ng/L — ABNORMAL HIGH (ref <18)

## 2020-04-20 LAB — VANCOMYCIN, RANDOM: Vancomycin Rm: 15

## 2020-04-20 MED ORDER — SODIUM BICARBONATE 8.4 % IV SOLN
INTRAVENOUS | Status: AC
  Filled 2020-04-20: qty 50

## 2020-04-20 MED ORDER — FENTANYL 2500MCG IN NS 250ML (10MCG/ML) PREMIX INFUSION
25.0000 ug/h | INTRAVENOUS | Status: DC
  Administered 2020-04-20 – 2020-04-24 (×3): 50 ug/h via INTRAVENOUS
  Filled 2020-04-20 (×3): qty 250

## 2020-04-20 MED ORDER — PANTOPRAZOLE SODIUM 40 MG PO PACK: 40.0000 mg | PACK | ORAL | Status: DC

## 2020-04-20 MED ORDER — INSULIN ASPART 100 UNIT/ML ~~LOC~~ SOLN
0.0000 [IU] | SUBCUTANEOUS | Status: DC
  Administered 2020-04-20: 11 [IU] via SUBCUTANEOUS
  Administered 2020-04-20: 8 [IU] via SUBCUTANEOUS
  Administered 2020-04-21 (×2): 3 [IU] via SUBCUTANEOUS
  Administered 2020-04-21 – 2020-04-23 (×10): 5 [IU] via SUBCUTANEOUS
  Administered 2020-04-23: 3 [IU] via SUBCUTANEOUS
  Administered 2020-04-23: 5 [IU] via SUBCUTANEOUS
  Administered 2020-04-23 – 2020-04-24 (×4): 3 [IU] via SUBCUTANEOUS
  Administered 2020-04-24 (×4): 5 [IU] via SUBCUTANEOUS
  Administered 2020-04-25: 2 [IU] via SUBCUTANEOUS
  Administered 2020-04-25: 3 [IU] via SUBCUTANEOUS
  Administered 2020-04-25 – 2020-04-27 (×11): 2 [IU] via SUBCUTANEOUS
  Administered 2020-04-27: 3 [IU] via SUBCUTANEOUS
  Administered 2020-04-27 – 2020-04-28 (×3): 2 [IU] via SUBCUTANEOUS
  Administered 2020-04-28: 3 [IU] via SUBCUTANEOUS
  Administered 2020-04-29: 0 [IU] via SUBCUTANEOUS
  Administered 2020-04-30 (×2): 2 [IU] via SUBCUTANEOUS
  Administered 2020-04-30 (×2): 3 [IU] via SUBCUTANEOUS
  Administered 2020-05-01: 2 [IU] via SUBCUTANEOUS
  Administered 2020-05-01 (×2): 3 [IU] via SUBCUTANEOUS
  Administered 2020-05-01: 2 [IU] via SUBCUTANEOUS
  Administered 2020-05-01 (×2): 3 [IU] via SUBCUTANEOUS
  Administered 2020-05-01: 2 [IU] via SUBCUTANEOUS
  Administered 2020-05-02 (×3): 3 [IU] via SUBCUTANEOUS
  Administered 2020-05-02 (×3): 2 [IU] via SUBCUTANEOUS
  Administered 2020-05-03 (×3): 3 [IU] via SUBCUTANEOUS
  Administered 2020-05-03 (×2): 2 [IU] via SUBCUTANEOUS
  Administered 2020-05-04: 3 [IU] via SUBCUTANEOUS
  Administered 2020-05-04: 5 [IU] via SUBCUTANEOUS
  Administered 2020-05-04 (×3): 3 [IU] via SUBCUTANEOUS
  Administered 2020-05-04: 5 [IU] via SUBCUTANEOUS
  Administered 2020-05-04 – 2020-05-05 (×2): 3 [IU] via SUBCUTANEOUS
  Administered 2020-05-05: 5 [IU] via SUBCUTANEOUS
  Administered 2020-05-05: 3 [IU] via SUBCUTANEOUS
  Administered 2020-05-05: 2 [IU] via SUBCUTANEOUS
  Administered 2020-05-05 – 2020-05-06 (×3): 5 [IU] via SUBCUTANEOUS
  Administered 2020-05-06: 3 [IU] via SUBCUTANEOUS
  Administered 2020-05-06: 5 [IU] via SUBCUTANEOUS
  Administered 2020-05-06: 3 [IU] via SUBCUTANEOUS
  Administered 2020-05-07 – 2020-05-09 (×7): 2 [IU] via SUBCUTANEOUS
  Administered 2020-05-09: 3 [IU] via SUBCUTANEOUS
  Administered 2020-05-09 (×4): 2 [IU] via SUBCUTANEOUS
  Administered 2020-05-10: 3 [IU] via SUBCUTANEOUS
  Administered 2020-05-10: 2 [IU] via SUBCUTANEOUS
  Administered 2020-05-10: 3 [IU] via SUBCUTANEOUS
  Administered 2020-05-10: 2 [IU] via SUBCUTANEOUS
  Administered 2020-05-10: 3 [IU] via SUBCUTANEOUS
  Administered 2020-05-10 – 2020-05-12 (×7): 2 [IU] via SUBCUTANEOUS
  Administered 2020-05-12: 3 [IU] via SUBCUTANEOUS
  Administered 2020-05-12: 2 [IU] via SUBCUTANEOUS
  Administered 2020-05-12: 3 [IU] via SUBCUTANEOUS
  Administered 2020-05-12 – 2020-05-13 (×3): 2 [IU] via SUBCUTANEOUS
  Administered 2020-05-13 (×2): 3 [IU] via SUBCUTANEOUS
  Administered 2020-05-13: 2 [IU] via SUBCUTANEOUS
  Administered 2020-05-13 – 2020-05-14 (×3): 3 [IU] via SUBCUTANEOUS
  Administered 2020-05-14: 2 [IU] via SUBCUTANEOUS
  Administered 2020-05-14 (×2): 3 [IU] via SUBCUTANEOUS

## 2020-04-20 MED ORDER — IPRATROPIUM-ALBUTEROL 0.5-2.5 (3) MG/3ML IN SOLN: 3.0000 mL | RESPIRATORY_TRACT | Status: DC | PRN

## 2020-04-20 MED ORDER — MIDAZOLAM HCL 2 MG/2ML IJ SOLN
1.0000 mg | INTRAMUSCULAR | Status: DC | PRN
  Administered 2020-04-21: 1 mg via INTRAVENOUS
  Filled 2020-04-20: qty 2

## 2020-04-20 MED ORDER — DILTIAZEM 12 MG/ML ORAL SUSPENSION
60.0000 mg | Freq: Four times a day (QID) | ORAL | Status: DC
  Administered 2020-04-20 – 2020-04-22 (×8): 60 mg
  Filled 2020-04-20 (×10): qty 6

## 2020-04-20 MED ORDER — DEXTROSE 50 % IV SOLN
INTRAVENOUS | Status: AC
  Filled 2020-04-20: qty 50

## 2020-04-20 MED ORDER — SODIUM BICARBONATE 8.4 % IV SOLN
50.0000 meq | Freq: Once | INTRAVENOUS | Status: AC
  Administered 2020-04-20: 50 meq via INTRAVENOUS

## 2020-04-20 MED ORDER — SODIUM BICARBONATE 8.4 % IV SOLN: 50.0000 meq | Freq: Once | INTRAVENOUS | Status: AC

## 2020-04-20 MED ORDER — SODIUM BICARBONATE 8.4 % IV SOLN
INTRAVENOUS | Status: AC
  Administered 2020-04-20: 50 meq
  Filled 2020-04-20: qty 50

## 2020-04-20 MED ORDER — INSULIN DETEMIR 100 UNIT/ML ~~LOC~~ SOLN
18.0000 [IU] | Freq: Two times a day (BID) | SUBCUTANEOUS | Status: DC
  Administered 2020-04-20 – 2020-04-22 (×4): 18 [IU] via SUBCUTANEOUS
  Filled 2020-04-20 (×6): qty 0.18

## 2020-04-20 MED ORDER — LACTATED RINGERS IV BOLUS: 1000.0000 mL | Freq: Once | INTRAVENOUS | Status: DC

## 2020-04-20 MED ORDER — INSULIN DETEMIR 100 UNIT/ML ~~LOC~~ SOLN
6.0000 [IU] | Freq: Once | SUBCUTANEOUS | Status: AC
  Administered 2020-04-20: 6 [IU] via SUBCUTANEOUS
  Filled 2020-04-20 (×2): qty 0.06

## 2020-04-20 MED ORDER — NOREPINEPHRINE 4 MG/250ML-% IV SOLN
2.0000 ug/min | INTRAVENOUS | Status: DC
  Administered 2020-04-20: 2 ug/min via INTRAVENOUS
  Administered 2020-04-20: 5 ug/min via INTRAVENOUS
  Administered 2020-04-21: 3 ug/min via INTRAVENOUS
  Administered 2020-04-23: 10 ug/min via INTRAVENOUS
  Administered 2020-04-23 – 2020-04-24 (×2): 2 ug/min via INTRAVENOUS
  Filled 2020-04-20 (×5): qty 250

## 2020-04-20 MED ORDER — PANTOPRAZOLE SODIUM 40 MG PO PACK
40.0000 mg | PACK | ORAL | Status: DC
  Administered 2020-04-21 – 2020-05-05 (×15): 40 mg
  Filled 2020-04-20 (×15): qty 20

## 2020-04-20 MED ORDER — NOREPINEPHRINE 4 MG/250ML-% IV SOLN
INTRAVENOUS | Status: AC
  Filled 2020-04-20: qty 250

## 2020-04-20 MED ORDER — DEXTROSE 50 % IV SOLN
INTRAVENOUS | Status: AC
  Administered 2020-04-20: 50 mL
  Filled 2020-04-20: qty 50

## 2020-04-20 MED ORDER — CHLORHEXIDINE GLUCONATE 0.12% ORAL RINSE (MEDLINE KIT)
15.0000 mL | Freq: Two times a day (BID) | OROMUCOSAL | Status: DC
  Administered 2020-04-21 – 2020-05-14 (×47): 15 mL via OROMUCOSAL

## 2020-04-20 MED ORDER — SODIUM CHLORIDE 0.9 % IV SOLN
250.0000 mL | INTRAVENOUS | Status: DC
  Administered 2020-05-01 – 2020-05-03 (×2): 250 mL via INTRAVENOUS

## 2020-04-20 MED ORDER — LACTATED RINGERS IV BOLUS
1000.0000 mL | Freq: Once | INTRAVENOUS | Status: AC
  Administered 2020-04-20: 1000 mL via INTRAVENOUS

## 2020-04-20 MED ORDER — SODIUM ZIRCONIUM CYCLOSILICATE 10 G PO PACK
10.0000 g | PACK | Freq: Once | ORAL | Status: AC
  Administered 2020-04-20: 10 g via ORAL
  Filled 2020-04-20: qty 1

## 2020-04-20 MED ORDER — FENTANYL BOLUS VIA INFUSION
25.0000 ug | INTRAVENOUS | Status: DC | PRN
  Administered 2020-04-22 – 2020-04-23 (×5): 25 ug via INTRAVENOUS
  Filled 2020-04-20: qty 25

## 2020-04-20 MED ORDER — ORAL CARE MOUTH RINSE
15.0000 mL | OROMUCOSAL | Status: DC
  Administered 2020-04-20 – 2020-05-14 (×230): 15 mL via OROMUCOSAL

## 2020-04-20 NOTE — Progress Notes (Signed)
Progress Note  Patient Name: Maecy Schedler Date of Encounter: 04/20/2020  Primary Cardiologist: New to [facility]  Subjective   Overnight had worsening hypoxia and hypotension.  As a sequale to that, had uptriage to ICU.    Inpatient Medications    Scheduled Meds: . chlorhexidine  15 mL Mouth Rinse BID  . Chlorhexidine Gluconate Cloth  6 each Topical Daily  . dextrose      . diltiazem  60 mg Per Tube Q6H  . feeding supplement (PROSource TF)  45 mL Per Tube Daily  . insulin aspart  0-15 Units Subcutaneous Q4H  . insulin aspart  4 Units Subcutaneous Q4H  . insulin detemir  18 Units Subcutaneous BID  . levETIRAcetam  750 mg Per Tube BID  . mouth rinse  15 mL Mouth Rinse q12n4p  . [START ON 04/21/2020] pantoprazole sodium  40 mg Per Tube Q24H  . sodium chloride flush  10-40 mL Intracatheter Q12H   Continuous Infusions: . sodium chloride    . feeding supplement (OSMOLITE 1.5 CAL) 1,000 mL (04/19/20 1437)  . lactated ringers    . norepinephrine (LEVOPHED) Adult infusion 5 mcg/min (04/20/20 1250)   PRN Meds: acetaminophen, ipratropium-albuterol, morphine injection, ondansetron (ZOFRAN) IV, sodium chloride flush   Vital Signs    Vitals:   04/20/20 1115 04/20/20 1130 04/20/20 1145 04/20/20 1205  BP: 104/86 111/66 111/61   Pulse: 80 81 72   Resp: (!) 27 (!) 8 (!) 0   Temp: 98.6 F (37 C) 98.6 F (37 C) 98.6 F (37 C)   TempSrc:      SpO2: 95% (!) 86% 98% 100%  Weight:      Height:        Intake/Output Summary (Last 24 hours) at 04/20/2020 1326 Last data filed at 04/20/2020 1254 Gross per 24 hour  Intake 1096.64 ml  Output 260 ml  Net 836.64 ml   Filed Weights   04/18/20 0300 04/19/20 0433 04/20/20 0500  Weight: 78.5 kg 80.1 kg 81.1 kg    Physical Exam   General: Elderly, NAD Lungs:Clear to ausculation bilaterally. Cardiovascular: Irregularly irregular better rate controle No murmurs Abdomen: Soft, non-tender, non-distended. No obvious abdominal masses. No  tendered at feeding tube site Extremities: No edema. Radial pulses 2+ bilaterally R stump site c/d/i; l stable R hand contracture Neuro: Did not opens eyes spontaneously on assessment today  Labs    Chemistry Recent Labs  Lab 04/15/20 1253 04/16/20 0132 04/18/20 [id number] 04/19/20 0156 04/20/20 0319 04/20/20 0510 04/20/20 0607 04/20/20 0934 04/20/20 1022  NA 135   < > 137 133* 131*  --  136  --  137  K 4.5   < > 3.6 4.4 5.9*   < > 5.2* 4.6 4.9  CL 97*   < > 103 101 98  --   --   --   --   CO2 27   < > 24 20* 17*  --   --   --   --   GLUCOSE 141*   < > 240* 452* 492*  --   --   --   --   BUN 23   < > 37* 46* 56*  --   --   --   --   CREATININE 0.96   < > 2.21* 3.07* 3.95*  --   --   --   --   CALCIUM 8.8*   < > 8.2* 8.1* 7.9*  --   --   --   --  PROT 8.0  --   --   --   --   --   --   --   --   ALBUMIN 2.3*  --   --   --  2.3*  --   --   --   --   AST 58*  --   --   --   --   --   --   --   --   ALT 25  --   --   --   --   --   --   --   --   ALKPHOS 75  --   --   --   --   --   --   --   --   BILITOT 1.1  --   --   --   --   --   --   --   --   GFRNONAA >60   < > 22* 15* 11*  --   --   --   --   ANIONGAP 11   < > 10 12 16*  --   --   --   --    < > = values in this interval not displayed.     Hematology Recent Labs  Lab 04/18/20 0418 04/19/20 0156 04/20/20 0222 04/20/20 0607 04/20/20 1022  WBC 7.7 10.5 14.6*  --   --   RBC 2.65*  2.64* 2.61* 2.59*  --   --   HGB 7.4* 7.6* 7.3* 9.2* 8.5*  HCT 25.1* 24.8* 26.0* 27.0* 25.0*  MCV 94.7 95.0 100.4*  --   --   MCH 27.9 29.1 28.2  --   --   MCHC 29.5* 30.6 28.1*  --   --   RDW 16.3* 16.4* 16.8*  --   --   PLT 234 239 300  --   --     Cardiac EnzymesNo results for input(s): TROPONINI in the last 168 hours. No results for input(s): TROPIPOC in the last 168 hours.   BNP Recent Labs  Lab 04/20/20 0431  BNP 1,070.8*     DDimer No results for input(s): DDIMER in the last 168 hours.   Radiology    DG Chest 2  View  Result Date: 04/19/2020 CLINICAL DATA:  Dyspnea, diabetes mellitus EXAM: CHEST - 2 VIEW COMPARISON:  Portable exam 1504 hours compared to 04/17/2020 FINDINGS: Tracheostomy tube unchanged. Enlargement of cardiac silhouette. Atherosclerotic calcifications aorta. Extensive BILATERAL pulmonary infiltrates, slightly increased. This could represent pulmonary edema or multifocal pneumonia. Small LEFT pleural effusion. No pneumothorax or acute osseous findings. IMPRESSION: BILATERAL pulmonary infiltrates question pulmonary edema versus multifocal pneumonia. Enlargement of cardiac silhouette with small LEFT pleural effusion. Electronically Signed   By: Lavonia Dana M.D.   On: 04/19/2020 16:40   US RENAL  Result Date: 04/18/2020 CLINICAL DATA:  Acute renal injury. EXAM: RENAL / URINARY TRACT ULTRASOUND COMPLETE COMPARISON:  CT abdomen pelvis 04/15/2020 FINDINGS: Right Kidney: Renal measurements: 10.5 x 3.9 x 4.5 cm = volume: 96 mL. Echogenicity within normal limits. No mass or hydronephrosis visualized. Left Kidney: Renal measurements: 9.3 x 4.8 x 4.1 cm = volume: 94 mL. Echogenicity within normal limits. No mass or hydronephrosis visualized. Bladder: Appears normal for degree of bladder distention. Other: Gallstones noted within the gallbladder lumen. At least trace simple free fluid noted within the pelvis. IMPRESSION: 1. Unremarkable renal ultrasound. 2. Cholelithiasis. 3. At least trace simple free fluid within the pelvis. Electronically Signed  By: Iven Finn M.D.   On: 04/18/2020 23:04   DG Chest Port 1 View  Result Date: 04/20/2020 CLINICAL DATA:  Acute respiratory distress EXAM: PORTABLE CHEST 1 VIEW COMPARISON:  04/19/2020 FINDINGS: Single frontal view of the chest demonstrates stable tracheostomy tube. Cardiac silhouette remains enlarged. Widespread interstitial and ground-glass opacities are unchanged since the prior study. Trace bilateral pleural effusions are noted. No pneumothorax.  IMPRESSION: 1. Stable widespread interstitial and ground-glass opacities consistent with edema or pneumonia. 2. Stable trace bilateral pleural effusions. Electronically Signed   By: Randa Ngo M.D.   On: 04/20/2020 02:44   Telemetry    AF rate controlled < 100 - Personally Reviewed  ECG    AF LBBB rate 60s- Personally Reviewed  Cardiac Studies   Echocardiogram:   Result date: 12/07/08 No evidence of intra-cardiac shunting by agitated IV saline contrast  No evidence of vegetation.  Mild concentric left ventricular hypertrophy is observed.  Global left ventricular wall motion and contractility are within normal  limits.  The ejection fraction is estimated to be 65%.  Evidence of mild mitral annular calcification.  There is mild dilatation of the ascending aorta.  Rhythm AF   Patient Profile     80 y.o. female with a hx of PAF, DM2, HTN, seizures, HFpEF, CVA with residual R sided deficits, GERD and trach who presented from [place] 2/2 ongoing AF/RVR, now complicated by hypotension  Assessment & Plan    Atrial fibrillation (RVR through course: Malnutrition with feeding tube AKI -Hx of PAF dating back to 2010 not on anticoagulation for unclear reasons per record review who presented to [facility] from [facility] with persistent AF with rates in the 125-150 range. -Continue AC given elevated CHA2DS2VASc 7 (age x2, CVA x2, DM, HTN, gender) -diltiazem now down to  60 mg q6HR; on pressors - will give AKI discussed with pharmacy team and will temporarily change eliquis to heparin -Echo performed 04/16/20 with LVEF at 55-60%, no RWMA, mod elevated PA pressures, mild to mod MR, mild AR and aortic dilatation at 66m    HTN: - new hypotension requiring pressors; backing of AV nodal agents; if unable to wean levophed can stop diltiazem completeley  Hx of CVA: -Residual hemiparesis and dysphagia s/p tracheostomy and PEG  -Started on Eliquis this admission   Acute on  chronic hypoxic respiratory failure failure: -s/p trach>>>PCCM consulted -Management per IM and PCCM   DM2: -SSI for glucose control -Per IM   Discussed pharmacy team  For questions or updates, please contact   Please consult [email or web address] for contact info under Cardiology/STEMI.  MRudean Haskell MD [id number] [place] [phone] 1:26 PM

## 2020-04-20 NOTE — Progress Notes (Signed)
[facility] Progress Note Patient Name: Rebecca Harrell DOB: [birth year] MRN: [id number]   Date of Service  04/20/2020  HPI/Events of Note  Patient originally admitted to the Hospitalist service 04/15/20, and transferred to the ICU overnight for acute on chronic hypoxemic  / hypercapnic respiratory failure, severe mixed acidosis, pulmonary edema, r/o aspiration pneumonia, AKI, and r/o type 2 NSTEMI.  eICU Interventions  New Patient Evaluation completed. Orders entered.        Kerry Kass Harveen Flesch 04/20/2020, 4:39 AM

## 2020-04-20 NOTE — Progress Notes (Signed)
NAME:  Rebecca Harrell, MRN:  [id number], DOB:  01-01-1941, LOS: 5 ADMISSION DATE:  04/15/2020, CONSULTATION DATE:  04/16/2020 REFERRING MD:  Dr. Myna Hidalgo, [facility] CHIEF COMPLAINT:  04/16/2020  Brief History:  17 yoF with chronic respiratory failure s/p trach sent from [facility] for Afib with RVR.  Patient is also being treated for pneumonia on zosyn pta.  CXR concerning for possible partially loculated effusion.  Pulmonary consulted for further evaluation.   Past Medical History:  HTN, DM, Chronic diastolic CHF, CVA with Rt side weakness, Seizure, Aspiration pneumonia, s/p trach and PEG, PAF  Significant Hospital Events:  3/13 Admitted [facility] 3/15 on trach collar 5L 28% 3/17 Worsening hypoxemia and increased secretions 3/18 PCCM called to see patient overnight secondary to increasing work of breathing and hypoxia secondary to possible aspiration event  Consults:  Cardiology Palliative care  Procedures:  Lurline Idol   Significant Diagnostic Tests:   3/13 CTA PE >> pulmonary edema, small b/p effusions, small loculated component in minor fissure and oblique fissure  3/13 CT abd/ pelvis >> No acute findings within the abdomen.  Micro Data:  3/13 BCx>>negative 3/13 SARS 2/ flu >> negative 3/13 UCx >> 100k Vanc resistant enterococcus 3/14 MRSA >> negative  Antimicrobials:  Vancomycin 3/13 >> 3/14 Zosyn 3/13 Zithromax 3/13 >> 3/17 Linezolid 3/15 >> 3/17 Cefepime 3/13 >>  Interim History / Subjective:  Remains on full vent support.  Objective   Blood pressure (!) 104/57, pulse 77, temperature 98.8 F (37.1 C), resp. rate 20, height '5\' 7"'$  (1.702 m), weight 81.1 kg, SpO2 99 %.    Vent Mode: PCV FiO2 (%):  [40 %-100 %] 100 % Set Rate:  [32 bmp-35 bmp] 35 bmp PEEP:  [5 cmH20-10 cmH20] 10 cmH20   Intake/Output Summary (Last 24 hours) at 04/20/2020 1102 Last data filed at 04/20/2020 1000 Gross per 24 hour  Intake 951.28 ml  Output 45 ml  Net 906.28 ml   Filed Weights   04/18/20 0300 04/19/20  0433 04/20/20 0500  Weight: 78.5 kg 80.1 kg 81.1 kg    General - grimaces with stimulation Eyes - pupils reactive ENT - trach site with tan secretions Cardiac - regular rate/rhythm, no murmur Chest - scattered rhonchi Abdomen - soft, non tender, decreased bowel sounds, G tube site clean Extremities - contracted on Rt arm, s/p Rt AKA, decreased muscle bulk Skin - no rashes Neuro - doesn't follow commands   Resolved Hospital Problem list     Assessment & Plan:   Acute on chronic hypoxic respiratory failure from aspiration pneumonitis. Tracheostomy status. - full vent support - f/u CXR intermittently - trach care - prn BDs  Loculated pleural effusions in minor fissure and oblique fissure. - nothing to be done for these at present - can f/u CXR intermittently  UTI, aspiration pneumonia. - continue ABx per primary team  Atrial fibrillation. - per cardiology  Hx of CVA with Rt hemiparesis. Vegetative state. - will need to clarify what families goals of care are  AKI. - nephrology consulted - she is not an appropriate candidate for long term HD  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. - SSI with levemir  Goals of care. - palliative care consulted  D/w Dr. Jodelle Gross practice (evaluated daily)  Diet: NPO DVT prophylaxis: Eliquis GI prophylaxis: protonix Mobility: bed rest Disposition: ICU Code Status: full code  Labs    CMP Latest Ref Rng & Units 04/20/2020 04/20/2020 04/20/2020  Glucose 70 - 99 mg/dL - - -  BUN 8 - 23 mg/dL - - -  Creatinine 0.44 - 1.00 mg/dL - - -  Sodium 135 - 145 mmol/L 137 - 136  Potassium 3.5 - 5.1 mmol/L 4.9 4.6 5.2(H)  Chloride 98 - 111 mmol/L - - -  CO2 22 - 32 mmol/L - - -  Calcium 8.9 - 10.3 mg/dL - - -  Total Protein 6.5 - 8.1 g/dL - - -  Total Bilirubin 0.3 - 1.2 mg/dL - - -  Alkaline Phos 38 - 126 U/L - - -  AST 15 - 41 U/L - - -  ALT 0 - 44 U/L - - -    CBC Latest Ref Rng & Units 04/20/2020 04/20/2020  04/20/2020  WBC 4.0 - 10.5 K/uL - - 14.6(H)  Hemoglobin 12.0 - 15.0 g/dL 8.5(L) 9.2(L) 7.3(L)  Hematocrit 36.0 - 46.0 % 25.0(L) 27.0(L) 26.0(L)  Platelets 150 - 400 K/uL - - 300    ABG    Component Value Date/Time   PHART 7.297 (L) 04/20/2020 1022   PCO2ART 51.8 (H) 04/20/2020 1022   PO2ART 335 (H) 04/20/2020 1022   HCO3 25.3 04/20/2020 1022   TCO2 27 04/20/2020 1022   ACIDBASEDEF 1.0 04/20/2020 1022   O2SAT 100.0 04/20/2020 1022    CBG (last 3)  Recent Labs    04/20/20 0338 04/20/20 0510 04/20/20 0728  GLUCAP 398* 365* 406*    Care time: 32 minutes  Chesley Mires, MD [facility] Pager - [phone] 04/20/2020, 11:16 AM

## 2020-04-20 NOTE — Progress Notes (Signed)
[facility] NEPHROLOGY PROGRESS NOTE  Assessment/ Plan: Pt is a 80 y.o. yo female  with PMH of DM, HFpEF, CVA with right-sided hemiparesis/contracture, s/p right BKA, PEG tube, chronic respiratory failure status post tracheostomy, she was recently at [facility] where treated for HCAP developed A. fib with RVR, hypotension and was sent to ER.  #Acute kidney injury, anuric: Suspect hemodynamically mediated AKI in the setting of A. fib with RVR, IV contrast, recent infection.  UA without proteinuria or microscopic hematuria.  Kidney ultrasound ruled out obstruction.  We will do bladder scan. Overnight, transferred to ICU for worsening respiratory status.  Now on vent.  Not making urine.  Both BUN and creatinine level climbing up. I would not offer hemodialysis with her limited functional status at baseline and the likelihood that this would further compromise her overall quality of life.  Palliative care team was consulted.  Continue supportive management.  #Hyperkalemia: Managed with Lokelma, sodium bicarb and IV diuretics.  Last potassium level 5.2.  Continue monitor.  #Acute on chronic hypoxic respiratory failure: Currently on vent in ICU.  #Severe sepsis/Healthcare associated pneumonia/septic shock: Continue broad-spectrum antibiotics per respiratory team.  On cefepime, linezolid and levo.Marland Kitchen  #A. fib with RVR: On Eliquis, diltiazem.  #Combined metabolic and respiratory acidosis: Improving with mechanical ventilation.  Discussed with ICU team.  Subjective: Overnight event noted.  Patient had respiratory distress, hypoxia.  Transferred to ICU and placed on mechanical ventilation.  Currently on Levophed to maintain blood pressure.  Not making much urine.  Not much responsive except painful stimuli. Objective Vital signs in last 24 hours: Vitals:   04/20/20 0745 04/20/20 0800 04/20/20 0815 04/20/20 0830  BP: 109/63 102/72 109/64   Pulse: 77 77 82 79  Resp:      Temp:  99.3 F (37.4 C) 99.3 F (37.4 C) 99.1 F (37.3 C) 99.1 F (37.3 C)  TempSrc:      SpO2: 100% 100% 100% 100%  Weight:      Height:       Weight change: 1 kg  Intake/Output Summary (Last 24 hours) at 04/20/2020 0849 Last data filed at 04/20/2020 0800 Gross per 24 hour  Intake 883.76 ml  Output 15 ml  Net 868.76 ml       Labs: Basic Metabolic Panel: Recent Labs  Lab 04/18/20 0418 04/19/20 0156 04/20/20 0319 04/20/20 0510 04/20/20 0607  NA 137 133* 131*  --  136  K 3.6 4.4 5.9* 6.0* 5.2*  CL 103 101 98  --   --   CO2 24 20* 17*  --   --   GLUCOSE 240* 452* 492*  --   --   BUN 37* 46* 56*  --   --   CREATININE 2.21* 3.07* 3.95*  --   --   CALCIUM 8.2* 8.1* 7.9*  --   --   PHOS  --   --  6.9*  --   --    Liver Function Tests: Recent Labs  Lab 04/15/20 1253 04/20/20 0319  AST 58*  --   ALT 25  --   ALKPHOS 75  --   BILITOT 1.1  --   PROT 8.0  --   ALBUMIN 2.3* 2.3*   No results for input(s): LIPASE, AMYLASE in the last 168 hours. No results for input(s): AMMONIA in the last 168 hours. CBC: Recent Labs  Lab 04/15/20 1253 04/16/20 0132 04/17/20 0231 04/18/20 0418 04/19/20 0156 04/20/20 0222 04/20/20 0607  WBC 6.9 8.0 6.4 7.7  10.5 14.6*  --   NEUTROABS 4.9  --   --   --   --   --   --   HGB 8.1* 8.1* 7.9* 7.4* 7.6* 7.3* 9.2*  HCT 27.2* 26.9* 26.7* 25.1* 24.8* 26.0* 27.0*  MCV 95.4 94.1 96.0 94.7 95.0 100.4*  --   PLT 225 242 221 234 239 300  --    Cardiac Enzymes: Recent Labs  Lab 04/18/20 1500  CKTOTAL 28*   CBG: Recent Labs  Lab 04/19/20 2017 04/20/20 0046 04/20/20 0338 04/20/20 0510 04/20/20 0728  GLUCAP 348* 358* 398* 365* 406*    Iron Studies:  Recent Labs    04/18/20 1500  IRON 56  TIBC 298  FERRITIN 245   Studies/Results: DG Chest 2 View  Result Date: 04/19/2020 CLINICAL DATA:  Dyspnea, diabetes mellitus EXAM: CHEST - 2 VIEW COMPARISON:  Portable exam 1504 hours compared to 04/17/2020 FINDINGS: Tracheostomy tube  unchanged. Enlargement of cardiac silhouette. Atherosclerotic calcifications aorta. Extensive BILATERAL pulmonary infiltrates, slightly increased. This could represent pulmonary edema or multifocal pneumonia. Small LEFT pleural effusion. No pneumothorax or acute osseous findings. IMPRESSION: BILATERAL pulmonary infiltrates question pulmonary edema versus multifocal pneumonia. Enlargement of cardiac silhouette with small LEFT pleural effusion. Electronically Signed   By: Lavonia Dana M.D.   On: 04/19/2020 16:40   US RENAL  Result Date: 04/18/2020 CLINICAL DATA:  Acute renal injury. EXAM: RENAL / URINARY TRACT ULTRASOUND COMPLETE COMPARISON:  CT abdomen pelvis 04/15/2020 FINDINGS: Right Kidney: Renal measurements: 10.5 x 3.9 x 4.5 cm = volume: 96 mL. Echogenicity within normal limits. No mass or hydronephrosis visualized. Left Kidney: Renal measurements: 9.3 x 4.8 x 4.1 cm = volume: 94 mL. Echogenicity within normal limits. No mass or hydronephrosis visualized. Bladder: Appears normal for degree of bladder distention. Other: Gallstones noted within the gallbladder lumen. At least trace simple free fluid noted within the pelvis. IMPRESSION: 1. Unremarkable renal ultrasound. 2. Cholelithiasis. 3. At least trace simple free fluid within the pelvis. Electronically Signed   By: Iven Finn M.D.   On: 04/18/2020 23:04   DG Chest Port 1 View  Result Date: 04/20/2020 CLINICAL DATA:  Acute respiratory distress EXAM: PORTABLE CHEST 1 VIEW COMPARISON:  04/19/2020 FINDINGS: Single frontal view of the chest demonstrates stable tracheostomy tube. Cardiac silhouette remains enlarged. Widespread interstitial and ground-glass opacities are unchanged since the prior study. Trace bilateral pleural effusions are noted. No pneumothorax. IMPRESSION: 1. Stable widespread interstitial and ground-glass opacities consistent with edema or pneumonia. 2. Stable trace bilateral pleural effusions. Electronically Signed   By: Randa Ngo M.D.   On: 04/20/2020 02:44    Medications: Infusions: . sodium chloride    . ceFEPime (MAXIPIME) IV Stopped (04/19/20 1217)  . feeding supplement (OSMOLITE 1.5 CAL) 1,000 mL (04/19/20 1437)  . lactated ringers    . norepinephrine (LEVOPHED) Adult infusion 9 mcg/min (04/20/20 0800)    Scheduled Medications: . apixaban  5 mg Per Tube BID  . budesonide (PULMICORT) nebulizer solution  0.25 mg Nebulization BID  . chlorhexidine  15 mL Mouth Rinse BID  . Chlorhexidine Gluconate Cloth  6 each Topical Daily  . dextrose      . diltiazem  120 mg Per Tube Q6H  . feeding supplement (PROSource TF)  45 mL Per Tube Daily  . insulin aspart  0-6 Units Subcutaneous Q4H  . insulin aspart  4 Units Subcutaneous Q4H  . insulin detemir  12 Units Subcutaneous BID  . ipratropium-albuterol  3 mL Nebulization TID  .  levETIRAcetam  750 mg Per Tube BID  . mouth rinse  15 mL Mouth Rinse q12n4p  . pantoprazole (PROTONIX) IV  40 mg Intravenous Daily  . sodium chloride flush  10-40 mL Intracatheter Q12H    have reviewed scheduled and prn medications.  Physical Exam: General: Critically ill looking female, not much responsive except painful stimuli, on mechanical ventilation,  Neck: tracheostomy presents Heart:RRR, s1s2 nl, no rub Lungs: Coarse breath sound bilateral Abdomen:soft,  non-distended Extremities: Contracture of upper extremities, right AKA.  Some edema present on left leg Neurology:  not responding   Rebecca Harrell 04/20/2020,8:49 AM  LOS: 5 days

## 2020-04-20 NOTE — Progress Notes (Addendum)
NAME:  Rebecca Harrell, MRN:  [id number], DOB:  25-Oct-1940, LOS: 5 ADMISSION DATE:  04/15/2020, CONSULTATION DATE:  04/16/2020 REFERRING MD:  Dr. Myna Hidalgo, CHIEF COMPLAINT:  04/16/2020  Brief History:  62 yoF with chronic respiratory failure s/p trach sent from [facility] for Afib with RVR.  Patient is also being treated for pneumonia on zosyn pta.  CXR concerning for possible partially loculated effusion.  Pulmonary consulted for further evaluation.   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  Found to be in Afib with RVR and initially hypotensive, which resolved after 2L NS bolus.  Patient afebrile and remains on ATC trial.   CTA without PE, notable for pulmonary edema with small bilateral pulmonary effusions and loculated pleural fluid accumulation within R minor fissure and  Oblique on the L.  She has been afebrile with no lactic acidosis or leukocytosis  Past Medical History:  hypertension, insulin-dependent diabetes mellitus, chronic diastolic CHF, seizure disorder, history of CVA with right-sided hemiparesis, chronic hypoxic respiratory failure, aspiration with tracheostomy and PEG tube dependence, paroxysmal atrial fibrillation recently taken off anticoagulation  Significant Hospital Events:  3/13 Admitted [place] 3/15 on trach collar 5L 28% 3/17 Worsening hypoxemia and increased secretions 3/18 PCCM called to see patient overnight secondary to increasing work of breathing and hypoxia secondary to possible aspiration event  Consults:  Cardiology Pulmonary    Procedures:  pta trach  Significant Diagnostic Tests:  3/13 CTA PE >> 1. Negative for acute pulmonary embolism. 2. Findings suggestive of pulmonary edema with small bilateral pleural effusions. Loculated pleural fluid accumulation within the minor fissure on the right and within the oblique fissure on the left. 3. Patchy bibasilar airspace opacities may represent edema, atelectasis, or pneumonia. 4. Patient's percutaneous gastrostomy tube appears malpositioned although is incompletely visualized at the edge of the field of view. CT of the abdomen could be obtained to further evaluate. 5. Mildly prominent mediastinal lymph nodes, likely reactive. 6. Aortic and coronary artery atherosclerosis   3/13 CT abd/ pelvis >> 1. Percutaneous gastrostomy tube is appropriately positioned with balloon located intraluminally in the anterior aspect of the distal gastric body. No evidence of outlet obstruction. 2. No acute findings within the abdomen. 3. Small bilateral pleural effusions with patchy bibasilar opacities. Aortic Atherosclerosis   Micro Data:  3/13 BCx>> 3/13 SARS 2/ flu >> neg 3/13 UCx >> 100k Vanc resistant enterococcus 3/13 sputum cx >> 3/14 MRSA >> neg  Antimicrobials:  pta zosyn 3/13 azithro >> 3/13 cefepime >> 3/13 vanc >>3/14 3/14 linezolid >>   Interim History / Subjective:  O2 increased to 100% FiO2, 15 L, patient with minimal urine output over the last day  Objective   Blood pressure (!) 103/54, pulse 81, temperature (!) 97.1 F (36.2 C), temperature source Axillary, resp. rate (!) 25, height '5\' 7"'$  (1.702 m), weight 80.1 kg, SpO2 95 %.    FiO2 (%):  [40 %-100 %] 40 %   Intake/Output Summary (Last 24 hours) at 04/20/2020 0352 Last data filed at 04/19/2020 1851 Gross per 24 hour  Intake 130 ml  Output --  Net 130 ml   Filed Weights   04/16/20 0339 04/18/20 0300 04/19/20 0433  Weight: 78.6 kg 78.5 kg 80.1 kg    General: Elderly, chronically ill-appearing  female on trach collar with tachypnea HEENT: MM pink/moist, thick white secretions Neuro: Eyes open, not otherwise responsive CV: s1s2 RRR, no m/r/g PULM: Coarse throughout, on 15 L 100% FiO2 ATC GI: soft, bsx4 active  Extremities: warm/dry, no edema  Skin: no rashes or lesions   Resolved Hospital Problem list    Assessment & Plan:   Acute onChronic hypoxic respiratory failure Aspiration pneumonitis, pneumonia HCAP Loculated pleural effusion  Pulmonary consulted for respiratory failure and evaluation of abnormal CT. On review of CTA, the location of the loculated effusions in the right minor fissure and the left oblique fissure in the setting of bilateral pleural effusions likely represent volume overload. Doubt these findings represent empyema however if she does develop fever, worsening hypoxemia or clinical condition, would consider repeat CT at that time.  Initially improved with diuresis however suspect new aspiration event today  Plan: -ABG with pH of 7.0 and PCO2 51.3, suspect she is tachypneic secondary to worsening metabolic acidosis -Repeat metabolic panel is pending -Blood pressure soft, overall clinical picture concerning will transfer to ICU for closer monitoring -Spoke to patient's daughter, they have not had any end-of-life conversations yet this admission, she would like full code and aggressive care for now.  The option of comfort based care was offered, she will start talking to other family members\ ---Continue broad spectrum antibiotics: Cefepime, azithromycin Linezolid --Trach aspirate pending --Wean oxygen requirement --Continue holding diuresis, calf was normal on recent echo, consider that she is over diuresed and give 1 L LR --Continue bronchodilators: Duonebs TID and Pulmicort BID --Routine trach care --Consider CT chest when renal function improves  Atrial Fibrillation  Worsening tachycardia the reason for initial EMS call from [facility], not improved with  Metoprolol, not on anti-coagulation for unclear reasons. Echo with normal EF, mod elevated PASP P: -Management and anticoagulation per Cardiology   Acute renal failure Creatinine 3.07 up from 0.96 on admission with minimal urine output over the last 24 hours Seen by nephrology who suspects this is a hemodynamically mediated AKI in the setting of A. fib with RVR P: -1 L LR overnight, monitor urine output and renal response -Urine electrolytes pending -Likely a poor candidate for dialysis, follow labs overnight, add palliative care consult  VRE UTI ID consulted P: -Continue contact precautions, ID following and recommend 3 days of linezolid  Best practice (evaluated daily)  Diet: per primary Pain/Anxiety/Delirium protocol (if indicated): n/a VAP protocol (if indicated): n/a DVT prophylaxis: Eliquis GI prophylaxis: n/a Glucose control: SSI Mobility: bed rest Disposition: progressive care Family communication:, Discussed patient's status and transfer to ICU with patient's daughter overnight  Goals of Care:  Last date of multidisciplinary goals of care discussion: per primary Family and staff present:  Summary of discussion:  Follow up goals of care discussion due:  Code Status: full code  Labs   CBC: Recent Labs  Lab 04/15/20 1253 04/16/20 0132 04/17/20 0231 04/18/20 0418 04/19/20 0156 04/20/20 0222  WBC 6.9 8.0 6.4 7.7 10.5 14.6*  NEUTROABS 4.9  --   --   --   --   --   HGB 8.1* 8.1* 7.9* 7.4* 7.6* 7.3*  HCT 27.2* 26.9* 26.7* 25.1* 24.8* 26.0*  MCV 95.4 94.1 96.0 94.7 95.0 100.4*  PLT 225 242 221 234 239 [id number]    Basic Metabolic Panel: Recent Labs  Lab 04/15/20 1253 04/16/20 0132 04/17/20 0231 04/18/20 0418 04/19/20 0156  NA 135  138 139 137 133*  K 4.5 3.6 3.1* 3.6 4.4  CL 97* 101 104 103 101  CO2 '27 26 24 24 '$ 20*  GLUCOSE 141* 149* 104* 240* 452*  BUN 23 23 27* 37* 46*  CREATININE 0.96 1.20* 1.42* 2.21* 3.07*  CALCIUM 8.8* 8.7* 8.4* 8.2* 8.1*    GFR: Estimated Creatinine Clearance: 16.7 mL/min (A) (by C-G formula based on SCr of 3.07 mg/dL (H)).  Care time: 35 minutes    CRITICAL CARE Performed by: Otilio Carpen Humzah Harty   Total critical care time: 35 minutes  Critical care time was exclusive of separately billable procedures and treating other patients.  Critical care was necessary to treat or prevent imminent or life-threatening deterioration.  Critical care was time spent personally by me on the following activities: development of treatment plan with patient and/or surrogate as well as nursing, discussions with consultants, evaluation of patient's response to treatment, examination of patient, obtaining history from patient or surrogate, ordering and performing treatments and interventions, ordering and review of laboratory studies, ordering and review of radiographic studies, pulse oximetry and re-evaluation of patient's condition.   Otilio Carpen Jayden Rudge, PA-C [facility] See Amion for pager If no response to pager , please call [phone] until 7pm After 7:00 pm call Elink  [phone]?[place]

## 2020-04-20 NOTE — Progress Notes (Signed)
Nutrition Follow-up  DOCUMENTATION CODES:   Not applicable  INTERVENTION:   When pt is medically appropriate to resume tube feeds, recommend: - Initiate Osmolite 1.5 @ 25 ml/hr and advance by 10 ml q 4 hours to goal rate of 55 ml/hr (1320 ml/day) - ProSource TF 45 ml daily  Recommended tube feeding regimen at goal rate would provide 2020 kcal, 94 grams of protein, and 1003 ml of H2O.   NUTRITION DIAGNOSIS:   Inadequate oral intake related to inability to eat as evidenced by NPO status.  Ongoing  GOAL:   Patient will meet greater than or equal to 90% of their needs  Unmet at this time (tube feeds are being held)  MONITOR:   TF tolerance,Skin,Weight trends,Labs,I & O's  REASON FOR ASSESSMENT:   Consult Assessment of nutrition requirement/status  ASSESSMENT:   80 year old with past medical history significant for hypertension, insulin-dependent diabetes, chronic diastolic heart failure, seizure disorder, history of CVA with right-sided hemiparesis, chronic hypoxic respiratory failure, aspiration with tracheostomy and PEG tube dependence, paroxysmal A. fib and currently under treatment for pneumonia at [facility] who presents for evaluation of persistent tachycardia.  3/18 - placed on vent support, transferred to ICU  Overnight pt developed increased work of breathing and hypoxia secondary to possible aspiration event. Per notes, pt with AKI and anuric and is a poor candidate for dialysis. Palliative Care has been consulted regarding [place].  Discussed pt with RN. Tube feeds are being held at this time related to pt's clinical condition. Tube feeds were held overnight as well. RD will leave tube feeding recommendations for use once pt considered medically appropriate to resume enteral nutrition.  Admit weight: 78.6 kg Current weight: 81.1 kg  Per RN edema assessment, pt with +1 pitting edema to LUE and LLE. Pt also with +2 pitting generalized edema.  Patient is currently  on ventilator support via trach. MV: 10.5 L/min Temp (24hrs), Avg:98.3 F (36.8 C), Min:96.44 F (35.8 C), Max:99.3 F (37.4 C) BP (cuff): 109/58 MAP (cuff): 72  Drips: Levophed @ 33.8 ml/hr  Medications reviewed and include: SSI q 4 hours, novolog 4 units q 4 hours, levemir 12 units BID, IV protonix, IV abx  Labs reviewed: potassium 5.2, BUN 56, creatinine 3.95, ionized calcium 1.06, phosphorus 6.9, lactic acid 6.4, hemoglobin 9.2 CBG's: 337-406 x 24 hours  UOP: 15 ml x 24 hours I/O's: +3.7 L since admit  Diet Order:   Diet Order            Diet NPO time specified  Diet effective now                 EDUCATION NEEDS:   Not appropriate for education at this time  Skin:  Skin Assessment: Skin Integrity Issues: Stage II: coccyx Other: necrotic left third toe, right AKA  Last BM:  04/20/20 type 7  Height:   Ht Readings from Last 1 Encounters:  04/20/20 '5\' 7"'$  (1.702 m)    Weight:   Wt Readings from Last 1 Encounters:  04/20/20 81.1 kg    BMI:  Body mass index is 28 kg/m.  Estimated Nutritional Needs:   Kcal:  1850-2100  Protein:  90-105 grams  Fluid:  >/= 1.8 L/day    Gustavus Bryant, MS, RD, LDN Inpatient Clinical Dietitian Please see AMiON for contact information.

## 2020-04-20 NOTE — Significant Event (Addendum)
Rapid Response Event Note   Reason for Call : Respiratory distress Initial Focused Assessment:  Notified by nursing staff of pt with decreased oxygen saturations to 85% on ATC and gasping breathing. Upon my arrival, pt is obtunded, guppy breathing with accessory muscle use, diaphoretic with sats 87%. FiO2 was increased to 98% ATC and pt was bagged and suctioned with copious amounts of purulent secretions removed. Initially, she was difficult to bag. BBS Rhonchi and very diminished. Pt with poor pulmonary mechanics and cannot mobilize her secretions.  Will check PCXR and ABG.  After suctioning, RR 26 with sats 97% on ATC.  0145-Afebrile, HR 81, 103/54 (65), RR 26 with sats 96% on 98% ATC.   Interventions:  -Stat PCXR -Stat ABG   Addendum: PCCM notified and also came to bedside.    MD Notified: Dr. Mable Fill came to bedside Call Time: 0102 Arrival Time: 0120 End Time: 0215  Madelynn Done, RN

## 2020-04-20 NOTE — Progress Notes (Signed)
Rt called to bedside pt. Was found in respiratory distress, and diaphoretic, agonal breathing. Suctioned mucus plug was cleared. Rt  Stopped Bagging place back on trach collar per md order.

## 2020-04-20 NOTE — Progress Notes (Signed)
Paged cardiology about patient's eliquis being dc/d today and nothing re-ordered. Was told that a pharmacy consult will be placed in the a.m. for heparin and patient is ok since she got her morning dose of eliquis. Continuing to monitor patient.

## 2020-04-20 NOTE — Progress Notes (Addendum)
[facility] Progress Note Patient Name: Rebecca Harrell DOB: 1940/12/05 MRN: [id number]   Date of Service  04/20/2020  HPI/Events of Note  Patient with a respiratory component to her acidosis while on CPAP. K+ 5.9.  eICU Interventions  Patient switched to pressure controlled ventilation and her rate adjusted to modestly increase her minute ventilation. Will check ABG in one  Hour and make further adjustments as necessary. Will treat hyperkalemia with a second amp of bicarb, Lokelma. She recently received a dose of Insulin.        Liara Holm U Ron Junco 04/20/2020, 5:00 AM

## 2020-04-20 NOTE — Consult Note (Addendum)
Palliative Medicine Inpatient Consult Note  Reason for consult:  Goals of Care  HPI:  Per intake H&P --> 80 year old female with past medical of hypertension, insulin-dependent diabetes mellitus, chronic diastolic CHF, seizure disorder, history of CVA with right-sided hemiparesis, chronic hypoxic respiratory failure, aspiration with tracheostomy and PEG tube dependence, paroxysmal atrial fibrillation recently taken off anticoagulation and currently under treatment with zosyn for pneumonia sent from [facility] with persistent tachycardia despite lopressor.  Found to be in Afib with RVR and initially hypotensive, which resolved after 2L NS bolus.  Patient afebrile and remains on ATC trial.   Palliative care has been asked to address goals of care in the setting of respiratory distress and AKI w/ anuria. Nephrology has seen and is not offering HD.  Clinical Assessment/Goals of Care:  *Please note that this is a verbal dictation therefore any spelling or grammatical errors are due to the "[place] [employer]" system interpretation.  I have reviewed medical records including EPIC notes, labs and imaging, received report from bedside RN, assessed the patient who is on the ventilator on maximum support.    I called patients children [name] (daughter) and Laurena Bering (son) to further discuss diagnosis prognosis, GOC, EOL wishes, disposition and options.   I introduced Palliative Medicine as specialized medical care for people living with serious illness. It focuses on providing relief from the symptoms and stress of a serious illness. The goal is to improve quality of life for both the patient and the family.  Laurena Bering shares that Rebecca Harrell is from [place], [place] originally. She has been married to her husband, Clare Gandy since the 1970's. She has three children, two sons and one daughter. 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   Prior to hospitalization,  Wendelin had been living at [facility] for the last month. Prior to this she was living at [facility] for > 15 years. She has been chronically debilitated since her CVA which left her with right sided hemiparesis.   We completed a review of Rebecca Harrell medical history inclusive of hypertension, insulin-dependent diabetes mellitus, chronic diastolic CHF, seizure disorder, history of CVA with right-sided hemiparesis, chronic hypoxic respiratory failure, aspiration with tracheostomy and PEG tube dependence, paroxysmal atrial fibrillation.   We discussed that acutely she is on full ventilator support, in addition to this her kidney are failing, and she is in need of medication to aid in support of her blood pressure. We discussed that there will be no further interventions such a dialysis as this would be contraindicated given patients exceptionally poor baseline.   A detailed discussion was had today regarding advanced directives - we have nothing on Vynca though patients son, Laurena Bering shares that he is the Media planner. It appears that there are no HCPOA forms therefore patients surrogate decision maker by the [place] would be her spouse Henli Hey though per conversation with her children they are separated therefore the additional decisions would fall to her children.  Concepts specific to code status, artifical feeding and hydration, continued IV antibiotics and rehospitalization was had. I strongly recommended considering a DNR given patients presently frail/debilitated state and the reality that perform CPR would cause more harm than benefit. Patients daughter, shared with the PMT that "it is not your decision." I kindly affirmed that it is she and her families decision though I worry that her mother's body is declaring itself in one direction, one that we cannot return  from.   The difference between a aggressive medical intervention path  and a palliative comfort care path for this patient at this  time was had. I shared that patients body is making the decision for Rebecca Harrell as with nonfunctioning kidneys we cannot sustain for long. Recommended considering a comfort path for An[place] at this point. Patients family would like to see and spend time with her before making any additional decisions. I asked if we may set up a meeting for further conversations, Laurena Bering said we do not need to pursue this. I asked if we may schedule a time to touch base again and he said, "no I'll get back to you."   Discussed the importance of continued conversation with family and their  medical providers regarding overall plan of care and treatment options, ensuring decisions are within the context of the patients values and GOCs. ______________________________________________________ Addendum:  I met at bedside with patients daughter, [name] this evening. I updated her on patient poor clinical state. We reviewed her multiorgan failure and that this means in honest terms Haillie is likely not going to exist on earth for very long. I strongly encouraged her at the very least to consider no CPR as we would cause Rebecca Harrell's body tremendous trauma should that be pursued. [name] shares that she is having a hard time wrapping her head around this. She goes on to tell me that her mother was full of life and that she would not wish to be "like this". I encouraged her again to allow me to set up a meeting with her family or if she still wished to decline this to reach out to them herself. I shared with her that at this point we are only going to end up causing Rebecca Harrell pain. She understood this and did endorse that she would discuss our conversation with her family.   Decision Maker: No surrogate decision maker on file. Patient was married though she is separated therefore her children are her decision makers.  SUMMARY OF RECOMMENDATIONS   Full Code / Full Scope of Care --> I strongly recommended consideration of DNAR and transition to a  comfort approach to care. Patients family at this time want to deliberate amongst themselves. I shared that I worry in the setting of [place] being fully support on ventilation and having acute kidney failure that it is a matter of time before something even more catastrophic occurs. I have offered to arrange an in house family meeting with providers to further discuss Najai's poor prognosis though her son, Laurena Bering had declined this.  Palliative care will offer ongoing support.   Code Status/Advance Care Planning: FULL CODE    Palliative Prophylaxis:   Oral Care, Turn Q2H  Additional Recommendations (Limitations, Scope, Preferences):  Continue current scope of care  Psycho-social/Spiritual:   Desire for further Chaplaincy support: No  Additional Recommendations: Education on k   Prognosis: Very poor in the setting of respiratory and kidney failure. Not a dialysis candidate.   Discharge Planning:   Review of Systems  Unable to perform ROS: Medical condition   Oral Intake %:  PEG depdendent I/O:  Anuria Bowel Movements:  Liquid stools Mobility: Chronically debilitated  Vitals:   04/20/20 0600 04/20/20 0618  BP: 94/62   Pulse: 64   Resp:    Temp: (!) 96.8 F (36 C)   SpO2: 97% 100%    Intake/Output Summary (Last 24 hours) at 04/20/2020 0648 Last data filed at 04/20/2020 0600 Gross per 24 hour  Intake  816.76 ml  Output 15 ml  Net 801.76 ml   Last Weight  Most recent update: 04/20/2020  5:15 AM   Weight  81.1 kg (178 lb 12.7 oz)           Gen:  Frail elderly AA F chronically ill appearing HEENT: dry mucous membranes, tracheostomy CV: Regular rate and rhythm  PULM: Ventilator ABD: soft/nontender  EXT: (+) LLE edema, RLE amputation Neuro: Somnolent  PPS: 10%   This conversation/these recommendations were discussed with patient primary care team, Dr. Halford Chessman  Time In: 1310 Time Out: 1420 Total Time: 70 Greater than 50%  of this time was spent counseling and  coordinating care related to the above assessment and plan.  [facility] Team Team Cell Phone: [phone] Please utilize secure chat with additional questions, if there is no response within 30 minutes please call the above phone number  Palliative Medicine Team providers are available by phone from 7am to 7pm daily and can be reached through the team cell phone.  Should this patient require assistance outside of these hours, please call the patient's attending physician.

## 2020-04-20 NOTE — Progress Notes (Signed)
D/w Dr. Broadus John.  She has asked that patient be transferred to [facility] service while in ICU.  Attending service changed.  Chesley Mires, MD [place] Pager - [phone] 04/20/2020, 12:34 PM

## 2020-04-20 NOTE — Progress Notes (Signed)
RN paged that patient was diaphoretic, clammy, with agonal breathing. I evaluated patient at bedside. Patient with stable vitals at the time of my exam. Prior to my arrival at bedside she had been suctioned, and reported that the episode was caused by trach plugging. She does have increased work of breathing with mild tachypnea at RR 23 bpm. Rhonchi BL. Radiology at bedside for CXR. RT unable to get ABG 2/2 contracture on one side, and three unsuccessful attempts on the other side. VBG ordered, as well as hypertonic saline neb. PCCM already consulting on this patient, advised RN to notify them of the change. Waiting for CXR results. Will continue to monitor.

## 2020-04-20 NOTE — Progress Notes (Addendum)
[facility] Progress Note Patient Name: Rebecca Harrell DOB: 1940/07/11 MRN: [id number]   Date of Service  04/20/2020  HPI/Events of Note  ABG: 7.32/ 49/ 54/ 21/ 83 % VENT: PCV 32 FIO2 100 % PEEP 5 Rate 25.  eICU Interventions  PEEP increased to 10, Rate increased to 32. ABG in one hour.        [name] 04/20/2020, 6:24 AM

## 2020-04-20 NOTE — Progress Notes (Signed)
Patient O2 saturation 85% on monitor.  RN assessed patient, patient was diaphoretic, cool touch, with agonal breathing.  RN contacted RT & Rapid response nurse.  PCCM was consulted and patient was transferred to ICU

## 2020-04-20 NOTE — Plan of Care (Signed)
  Problem: Education: Goal: Knowledge of disease or condition will improve 04/20/2020 0903 by Laverda Sorenson, Othelia Pulling, RN Outcome: Progressing 04/20/2020 0903 by Laverda Sorenson, Othelia Pulling, RN Outcome: Progressing Goal: Understanding of medication regimen will improve 04/20/2020 0903 by Laverda Sorenson, Othelia Pulling, RN Outcome: Progressing 04/20/2020 0903 by Laverda Sorenson, Othelia Pulling, RN Outcome: Progressing Goal: Individualized Educational Video(s) 04/20/2020 0903 by Laverda Sorenson, Othelia Pulling, RN Outcome: Progressing 04/20/2020 0903 by Laverda Sorenson, Othelia Pulling, RN Outcome: Progressing   Problem: Education: Goal: Individualized Educational Video(s) 04/20/2020 0903 by Laverda Sorenson, Othelia Pulling, RN Outcome: Progressing 04/20/2020 0903 by Laverda Sorenson, Othelia Pulling, RN Outcome: Progressing

## 2020-04-21 ENCOUNTER — Inpatient Hospital Stay (HOSPITAL_COMMUNITY): Payer: Medicare Other

## 2020-04-21 DIAGNOSIS — J9621 Acute and chronic respiratory failure with hypoxia: Secondary | ICD-10-CM | POA: Diagnosis not present

## 2020-04-21 DIAGNOSIS — J189 Pneumonia, unspecified organism: Secondary | ICD-10-CM | POA: Diagnosis not present

## 2020-04-21 DIAGNOSIS — I4891 Unspecified atrial fibrillation: Secondary | ICD-10-CM | POA: Diagnosis not present

## 2020-04-21 DIAGNOSIS — Z515 Encounter for palliative care: Secondary | ICD-10-CM | POA: Diagnosis not present

## 2020-04-21 DIAGNOSIS — N179 Acute kidney failure, unspecified: Secondary | ICD-10-CM | POA: Diagnosis not present

## 2020-04-21 LAB — APTT: aPTT: 100 seconds — ABNORMAL HIGH (ref 24–36)

## 2020-04-21 LAB — RENAL FUNCTION PANEL
Albumin: 1.9 g/dL — ABNORMAL LOW (ref 3.5–5.0)
Anion gap: 14 (ref 5–15)
BUN: 61 mg/dL — ABNORMAL HIGH (ref 8–23)
CO2: 23 mmol/L (ref 22–32)
Calcium: 8 mg/dL — ABNORMAL LOW (ref 8.9–10.3)
Chloride: 100 mmol/L (ref 98–111)
Creatinine, Ser: 4.25 mg/dL — ABNORMAL HIGH (ref 0.44–1.00)
GFR, Estimated: 10 mL/min — ABNORMAL LOW (ref >60)
Glucose, Bld: 144 mg/dL — ABNORMAL HIGH (ref 70–99)
Phosphorus: 5.1 mg/dL — ABNORMAL HIGH (ref 2.5–4.6)
Potassium: 3.8 mmol/L (ref 3.5–5.1)
Sodium: 137 mmol/L (ref 135–145)

## 2020-04-21 LAB — GLUCOSE, CAPILLARY
Glucose-Capillary: 82 mg/dL (ref 70–99)
Glucose-Capillary: 140 mg/dL — ABNORMAL HIGH (ref 70–99)
Glucose-Capillary: 64 mg/dL — ABNORMAL LOW (ref 70–99)
Glucose-Capillary: 139 mg/dL — ABNORMAL HIGH (ref 70–99)
Glucose-Capillary: 129 mg/dL — ABNORMAL HIGH (ref 70–99)
Glucose-Capillary: 124 mg/dL — ABNORMAL HIGH (ref 70–99)
Glucose-Capillary: 158 mg/dL — ABNORMAL HIGH (ref 70–99)
Glucose-Capillary: 185 mg/dL — ABNORMAL HIGH (ref 70–99)
Glucose-Capillary: 231 mg/dL — ABNORMAL HIGH (ref 70–99)
Glucose-Capillary: 229 mg/dL — ABNORMAL HIGH (ref 70–99)
Glucose-Capillary: 245 mg/dL — ABNORMAL HIGH (ref 70–99)

## 2020-04-21 LAB — CBC
HCT: 23.1 % — ABNORMAL LOW (ref 36.0–46.0)
Hemoglobin: 7.1 g/dL — ABNORMAL LOW (ref 12.0–15.0)
MCH: 29 pg (ref 26.0–34.0)
MCHC: 30.7 g/dL (ref 30.0–36.0)
MCV: 94.3 fL (ref 80.0–100.0)
Platelets: 234 10*3/uL (ref 150–400)
RBC: 2.45 MIL/uL — ABNORMAL LOW (ref 3.87–5.11)
RDW: 16.7 % — ABNORMAL HIGH (ref 11.5–15.5)
WBC: 14.2 10*3/uL — ABNORMAL HIGH (ref 4.0–10.5)
nRBC: 0.3 % — ABNORMAL HIGH (ref 0.0–0.2)

## 2020-04-21 LAB — HEPARIN LEVEL (UNFRACTIONATED): Heparin Unfractionated: >2.2 IU/mL — ABNORMAL HIGH (ref 0.30–0.70)

## 2020-04-21 MED ORDER — HEPARIN (PORCINE) 25000 UT/250ML-% IV SOLN
1300.0000 [IU]/h | INTRAVENOUS | Status: AC
  Administered 2020-04-21: 1100 [IU]/h via INTRAVENOUS
  Administered 2020-04-22 – 2020-04-25 (×3): 900 [IU]/h via INTRAVENOUS
  Administered 2020-04-26: 950 [IU]/h via INTRAVENOUS
  Administered 2020-04-27 – 2020-04-28 (×2): 1150 [IU]/h via INTRAVENOUS
  Administered 2020-04-29 (×2): 1300 [IU]/h via INTRAVENOUS
  Filled 2020-04-21 (×10): qty 250

## 2020-04-21 MED ORDER — MIDAZOLAM HCL 2 MG/2ML IJ SOLN
1.0000 mg | INTRAMUSCULAR | Status: DC | PRN
  Administered 2020-04-23: 2 mg via INTRAVENOUS
  Filled 2020-04-21: qty 2

## 2020-04-21 MED ORDER — CHLORHEXIDINE GLUCONATE CLOTH 2 % EX PADS
6.0000 | MEDICATED_PAD | Freq: Every day | CUTANEOUS | Status: DC
  Administered 2020-04-21 – 2020-05-13 (×23): 6 via TOPICAL

## 2020-04-21 MED ORDER — OXYCODONE HCL 5 MG/5ML PO SOLN
5.0000 mg | Freq: Four times a day (QID) | ORAL | Status: DC | PRN
  Administered 2020-04-23 – 2020-04-28 (×3): 5 mg
  Filled 2020-04-21 (×3): qty 5

## 2020-04-21 MED ORDER — DEXTROSE 50 % IV SOLN
INTRAVENOUS | Status: AC
  Administered 2020-04-21: 50 mL
  Filled 2020-04-21: qty 50

## 2020-04-21 MED ORDER — LACTATED RINGERS IV BOLUS
1000.0000 mL | Freq: Once | INTRAVENOUS | Status: AC
  Administered 2020-04-21: 1000 mL via INTRAVENOUS

## 2020-04-21 MED ORDER — QUETIAPINE FUMARATE 25 MG PO TABS
25.0000 mg | ORAL_TABLET | Freq: Every day | ORAL | Status: DC
  Administered 2020-04-21 – 2020-05-13 (×19): 25 mg
  Filled 2020-04-21 (×19): qty 1

## 2020-04-21 NOTE — Progress Notes (Signed)
Palliative Medicine Inpatient Follow Up Note  Reason for consult:  Goals of Care  HPI:  Per intake H&P --> 80 year old female with past medical of hypertension, insulin-dependent diabetes mellitus, chronic diastolic CHF, seizure disorder, history of CVA with right-sided hemiparesis, chronic hypoxic respiratory failure, aspiration with tracheostomy and PEG tube dependence, paroxysmal atrial fibrillation recently taken off anticoagulation and currently under treatment with zosyn for pneumonia sent from [facility] with persistent tachycardia despite lopressor. Found to be in Afib with RVR and initially hypotensive, which resolved after 2L NS bolus. Patient afebrile and remains on ATC trial.   Palliative care has been asked to address goals of care in the setting of respiratory distress and AKI w/ anuria. Nephrology has seen and is not offering HD.  Today's Discussion (04/21/2020):  *Please note that this is a verbal dictation therefore any spelling or grammatical errors are due to the "[place] [employer]" system interpretation.  I met at bedside this afternoon with Rebecca Harrell's son, Rebecca Harrell. I updated him on the severity of Rebecca Harrell's ailments. We reviewed that right now they are weaning her ventilatory support. We reviewed that she still has significant kidney injury which may not improve. We discussed that she could at anytime acutely decompensate. I shared that these are important considerations moving forward as we consider the quality of life that Rebecca Harrell is living. He appeared to understand this. We discussed CPR and the likelihood that she would not survive an actually cardiac arrest successfully. If anything is would leave her in a much poorer state than she is already in. He understood this.  I later called Rebecca Harrell's daughter, Rebecca Harrell and provided her a medical update. We reviewed that at present she is weaning off vent support. We discussed that the medical team is going down on her pressors. Rebecca Harrell  is pleased to hear this and remains hopeful for improvements. I shared with her that her mother remains critically ill and that irregardless of these improvements she remains to have very poorly functioning kidneys which will not aid in sustaining her body long term. She expresses understanding of this. I again approached the topic of code status. Rebecca Harrell says that she has not spoken to her brother. She said that she is not going to agree to no chest compression irregardless at this point of what it recommended. She shares that the views of the medical team and her views differ. I asked her to elaborate on this thought and she goes on to tell me that because it's recommended to her does not mean she would agree to it. I offered to speak to she and her brothers at the same time which has again been declined.  Questions and concerns addressed   Objective Assessment: Vital Signs Vitals:   04/21/20 0945 04/21/20 1000  BP: (!) 95/44 96/75  Pulse: 95 (!) 104  Resp: 17 17  Temp: 98.4 F (36.9 C) 98.4 F (36.9 C)  SpO2: 97% 96%    Intake/Output Summary (Last 24 hours) at 04/21/2020 1338 Last data filed at 04/21/2020 1200 Gross per 24 hour  Intake 2097.68 ml  Output 415 ml  Net 1682.68 ml   Last Weight  Most recent update: 04/21/2020  3:20 AM   Weight  81.1 kg (178 lb 12.7 oz)           Gen:  Frail elderly AA F chronically ill appearing HEENT: dry mucous membranes, tracheostomy CV: Regular rate and rhythm  PULM: Ventilator ABD: soft/nontender  EXT: (+) LLE edema, RLE  amputation Neuro: Somnolent  SUMMARY OF RECOMMENDATIONS Full Code / Full Scope of Care --> I strongly recommended consideration of DNAR. Patients daughter continues to fluctuate regarding this topic stating that the views of the medical team and her views differ.  Palliative care will follow incrementally and offer ongoing support. As of present patients family would like to continue with all aggressive measures for Rebecca Harrell  to sustain life.  Patients family members have all been updated. There is ambivalence towards a formal family meeting of unclear reasoning.  Time Spent: 35 Greater than 50% of the time was spent in counseling and coordination of care ______________________________________________________________________________________ [facility] [facility] Team Team Cell Phone: [phone] Please utilize secure chat with additional questions, if there is no response within 30 minutes please call the above phone number  Palliative Medicine Team providers are available by phone from 7am to 7pm daily and can be reached through the team cell phone.  Should this patient require assistance outside of these hours, please call the patient's attending physician.

## 2020-04-21 NOTE — Progress Notes (Addendum)
[facility] NEPHROLOGY PROGRESS NOTE  Assessment/ Plan: Pt is a 80 y.o. yo female  with PMH of DM, HFpEF, CVA with right-sided hemiparesis/contracture, s/p right BKA, PEG tube, chronic respiratory failure status post tracheostomy, she was recently at [facility] where treated for HCAP developed A. fib with RVR, hypotension and was sent to ER.  #Acute kidney injury, oliguric: Suspect hemodynamically mediated AKI in the setting of A. fib with RVR, IV contrast, recent infection.  UA without proteinuria or microscopic hematuria.  Kidney ultrasound ruled out obstruction.   She is oliguric and creatinine level trending up.  The potassium level and volume status acceptable. I would not offer hemodialysis with her limited functional status at baseline and the likelihood that this would further compromise her overall quality of life.  Palliative care team is following.  Continue supportive management.  There is no urgent need for dialysis today.  #Hyperkalemia: Status post medical management.  Potassium level improved now.    #Acute on chronic hypoxic respiratory failure: Currently on vent in ICU.  #Severe sepsis/Healthcare associated pneumonia/septic shock: Continue broad-spectrum antibiotics per respiratory team.  On cefepime, linezolid and levophed.  #A. fib with RVR: On Eliquis, diltiazem.  #Combined metabolic and respiratory acidosis: Improving with mechanical ventilation.  Subjective: Urine output recorded only 265 cc and there was 40 cc urine in the Foley bag.  Remains intubated and on vent.  Currently on Levophed to maintain blood pressure. Objective Vital signs in last 24 hours: Vitals:   04/21/20 0630 04/21/20 0645 04/21/20 0800 04/21/20 0807  BP: '93/63 93/73 98/72 '   Pulse: (!) 105 (!) 102 96   Resp: (!) 25 (!) 25 (!) 43   Temp: 98.06 F (36.7 C) 98.06 F (36.7 C) 98.78 F (37.1 C)   TempSrc:   Bladder   SpO2: 98% 98% 98% 99%  Weight:      Height:       Weight  change: 0 kg  Intake/Output Summary (Last 24 hours) at 04/21/2020 0938 Last data filed at 04/21/2020 0800 Gross per 24 hour  Intake 1392.8 ml  Output 605 ml  Net 787.8 ml       Labs: Basic Metabolic Panel: Recent Labs  Lab 04/19/20 0156 04/20/20 0319 04/20/20 0510 04/20/20 0607 04/20/20 0934 04/20/20 1022 04/20/20 1446 04/21/20 0316  NA 133* 131*  --  136  --  137  --  137  K 4.4 5.9*   < > 5.2*   < > 4.9 4.3 3.8  CL 101 98  --   --   --   --   --  100  CO2 20* 17*  --   --   --   --   --  23  GLUCOSE 452* 492*  --   --   --   --   --  144*  BUN 46* 56*  --   --   --   --   --  61*  CREATININE 3.07* 3.95*  --   --   --   --   --  4.25*  CALCIUM 8.1* 7.9*  --   --   --   --   --  8.0*  PHOS  --  6.9*  --   --   --   --   --  5.1*   < > = values in this interval not displayed.   Liver Function Tests: Recent Labs  Lab 04/15/20 1253 04/20/20 0319 04/21/20 0316  AST 58*  --   --  ALT 25  --   --   ALKPHOS 75  --   --   BILITOT 1.1  --   --   PROT 8.0  --   --   ALBUMIN 2.3* 2.3* 1.9*   No results for input(s): LIPASE, AMYLASE in the last 168 hours. No results for input(s): AMMONIA in the last 168 hours. CBC: Recent Labs  Lab 04/15/20 1253 04/16/20 0132 04/17/20 0231 04/18/20 0418 04/19/20 0156 04/20/20 0222 04/20/20 0607 04/20/20 1022 04/21/20 0316  WBC 6.9   < > 6.4 7.7 10.5 14.6*  --   --  14.2*  NEUTROABS 4.9  --   --   --   --   --   --   --   --   HGB 8.1*   < > 7.9* 7.4* 7.6* 7.3* 9.2* 8.5* 7.1*  HCT 27.2*   < > 26.7* 25.1* 24.8* 26.0* 27.0* 25.0* 23.1*  MCV 95.4   < > 96.0 94.7 95.0 100.4*  --   --  94.3  PLT 225   < > 221 234 239 300  --   --  234   < > = values in this interval not displayed.   Cardiac Enzymes: Recent Labs  Lab 04/18/20 1500  CKTOTAL 28*   CBG: Recent Labs  Lab 04/21/20 0224 04/21/20 0320 04/21/20 0406 04/21/20 0524 04/21/20 0737  GLUCAP 140* 139* 129* 124* 158*    Iron Studies:  Recent Labs     04/18/20 1500  IRON 56  TIBC 298  FERRITIN 245   Studies/Results: DG Chest 2 View  Result Date: 04/19/2020 CLINICAL DATA:  Dyspnea, diabetes mellitus EXAM: CHEST - 2 VIEW COMPARISON:  Portable exam 1504 hours compared to 04/17/2020 FINDINGS: Tracheostomy tube unchanged. Enlargement of cardiac silhouette. Atherosclerotic calcifications aorta. Extensive BILATERAL pulmonary infiltrates, slightly increased. This could represent pulmonary edema or multifocal pneumonia. Small LEFT pleural effusion. No pneumothorax or acute osseous findings. IMPRESSION: BILATERAL pulmonary infiltrates question pulmonary edema versus multifocal pneumonia. Enlargement of cardiac silhouette with small LEFT pleural effusion. Electronically Signed   By: Lavonia Dana M.D.   On: 04/19/2020 16:40   DG Chest Port 1 View  Result Date: 04/21/2020 CLINICAL DATA:  Respiratory failure EXAM: PORTABLE CHEST 1 VIEW COMPARISON:  CT 04/15/2020, radiograph 04/20/2020 FINDINGS: Endotracheal tube tip terminates in the mid trachea, 5 cm from the carina. Telemetry leads and external support devices overlie the chest. The patient's right hand overlies the right lung base as well. Diffuse heterogeneous opacities again seen throughout both lungs, with some interval clearing particularly in the left mid lung. No pneumothorax. Suspect layering left effusion. No visible right effusion. Additional bandlike opacities favoring subsegmental atelectasis. Stable cardiomediastinal contours with a calcified aorta. No acute osseous or soft tissue abnormality. IMPRESSION: Endotracheal tube tip terminates in the mid trachea, 5 cm from the carina. Improving bilateral airspace opacities may reflect some resolving edema or infection. Layering left effusion. Electronically Signed   By: Lovena Le M.D.   On: 04/21/2020 05:34   DG Chest Port 1 View  Result Date: 04/20/2020 CLINICAL DATA:  Acute respiratory distress EXAM: PORTABLE CHEST 1 VIEW COMPARISON:  04/19/2020  FINDINGS: Single frontal view of the chest demonstrates stable tracheostomy tube. Cardiac silhouette remains enlarged. Widespread interstitial and ground-glass opacities are unchanged since the prior study. Trace bilateral pleural effusions are noted. No pneumothorax. IMPRESSION: 1. Stable widespread interstitial and ground-glass opacities consistent with edema or pneumonia. 2. Stable trace bilateral pleural effusions. Electronically Signed   By:  Randa Ngo M.D.   On: 04/20/2020 02:44    Medications: Infusions: . sodium chloride    . feeding supplement (OSMOLITE 1.5 CAL) 35 mL/hr at 04/21/20 0545  . fentaNYL infusion INTRAVENOUS 50 mcg/hr (04/21/20 0800)  . lactated ringers    . norepinephrine (LEVOPHED) Adult infusion 4 mcg/min (04/21/20 0800)    Scheduled Medications: . chlorhexidine gluconate (MEDLINE KIT)  15 mL Mouth Rinse BID  . Chlorhexidine Gluconate Cloth  6 each Topical Daily  . diltiazem  60 mg Per Tube Q6H  . feeding supplement (PROSource TF)  45 mL Per Tube Daily  . insulin aspart  0-15 Units Subcutaneous Q4H  . insulin aspart  4 Units Subcutaneous Q4H  . insulin detemir  18 Units Subcutaneous BID  . levETIRAcetam  750 mg Per Tube BID  . mouth rinse  15 mL Mouth Rinse 10 times per day  . pantoprazole sodium  40 mg Per Tube Q24H  . sodium chloride flush  10-40 mL Intracatheter Q12H    have reviewed scheduled and prn medications.  Physical Exam: No change from yesterday. General: Critically ill looking female, not much responsive except painful stimuli, on mechanical ventilation,  Neck: tracheostomy presents Heart:RRR, s1s2 nl, no rub Lungs: Coarse breath sound bilateral Abdomen:soft,  non-distended Extremities: Contracture of upper extremities, right AKA.  Some edema present on left leg Neurology:  not responding   Rebecca Harrell 04/21/2020,9:38 AM  LOS: 6 days

## 2020-04-21 NOTE — Progress Notes (Signed)
NAME:  Rebecca Harrell, MRN:  [id number], DOB:  1940/10/13, LOS: 6 ADMISSION DATE:  04/15/2020, CONSULTATION DATE:  04/16/2020 REFERRING MD:  Dr. Myna Hidalgo, [facility] CHIEF COMPLAINT:  04/16/2020  Brief History:  7 yoF with chronic respiratory failure s/p trach sent from [facility] for Afib with RVR.  Patient is also being treated for pneumonia on zosyn pta.  CXR concerning for possible partially loculated effusion.  Pulmonary consulted for further evaluation.   Past Medical History:  HTN, DM, Chronic diastolic CHF, CVA with Rt side weakness, Seizure, Aspiration pneumonia, s/p trach and PEG, PAF  Significant Hospital Events:  3/13 Admitted [place] 3/15 on trach collar 5L 28% 3/17 Worsening hypoxemia and increased secretions 3/18 PCCM called to see patient overnight secondary to increasing work of breathing and hypoxia secondary to possible aspiration event 3/19 air leak from trach >> improved after changing to pressure support  Consults:  Cardiology Palliative care  Procedures:  Lurline Idol   Significant Diagnostic Tests:   3/13 CTA PE >> pulmonary edema, small b/p effusions, small loculated component in minor fissure and oblique fissure  3/13 CT abd/ pelvis >> No acute findings within the abdomen.  3/16 renal u/s >> cholelithiasis, normal kidneys  Micro Data:  3/13 BCx>>negative 3/13 SARS 2/ flu >> negative 3/13 UCx >> 100k Vanc resistant enterococcus 3/14 MRSA >> negative 3/17 sputum >>   Antimicrobials:  Vancomycin 3/13 >> 3/14 Zosyn 3/13 Zithromax 3/13 >> 3/17 Linezolid 3/15 >> 3/17 Cefepime 3/13 >> 3/19  Interim History / Subjective:  Had issues with cuff leak last night.  Maintaining oxygenation.  Objective   Blood pressure 98/72, pulse 96, temperature 98.78 F (37.1 C), temperature source Bladder, resp. rate (!) 43, height '5\' 7"'$  (1.702 m), weight 81.1 kg, SpO2 99 %.    Vent Mode: PSV;CPAP FiO2 (%):  [40 %] 40 % Set Rate:  [25 bmp] 25 bmp Vt Set:  [400 mL] 400 mL PEEP:  [8  cmH20-10 cmH20] 8 cmH20 Pressure Support:  [10 cmH20] 10 cmH20 Plateau Pressure:  [13 cmH20-17 cmH20] 13 cmH20   Intake/Output Summary (Last 24 hours) at 04/21/2020 0856 Last data filed at 04/21/2020 0800 Gross per 24 hour  Intake 1426.51 ml  Output 605 ml  Net 821.51 ml   Filed Weights   04/19/20 0433 04/20/20 0500 04/21/20 0320  Weight: 80.1 kg 81.1 kg 81.1 kg    General - grimaces with stimulation Eyes - pupils reactive ENT - trach site clean Cardiac - irregular Chest - equal breath sounds b/l, no wheezing or rales Abdomen - soft, non tender, + bowel sounds, G tube in place Extremities - Rt arm contracted, s/p Rt AKA Skin - sacral wound Neuro - doesn't follow commands   Resolved Hospital Problem list   VRE UTI, Hyperkalemia  Assessment & Plan:   Acute on chronic hypoxic respiratory failure from aspiration pneumonitis. Tracheostomy status. - cuff leak noted 3/19; seems to do better on pressure support - will try to wean back to trach collar as able - f/u CXR intermittently - trach care - prn BDs - goal SpO2 > 92% - completed cefepime 3/19  Hypotension from hypovolemia and sedation. - wean pressors to keep MAP > 60 - continue IV fluids  Loculated pleural effusions in minor fissure and oblique fissure. - nothing to be done for these at present - f/u CXR intermittently  Atrial fibrillation. - cardiology consulted - hold eliquis in setting of renal failure - defer starting heparin gtt to cardiology - continue cardizem  Hx  of CVA with Rt hemiparesis. Seizure disorder. Vegetative state. - RASS goal 0 - continue keppra  AKI from ATN 2nd to hypoxia and hypotension. - baseline creatinine 0.96 from 04/15/20 - nephrology consulted - she is not an appropriate candidate for long term HD - f/u BMET, monitor urine outpt  Anemia of critical illness and chronic disease. - f/u CBC intermittently - transfuse for Hb < 7 or significant bleeding  DM type 2 poorly  controlled with hyperglycemia. - SSI with tube feed coverage and levemir 18 units bid  Goals of care. - ongoing discussions with palliative care  Pressure injury. - coccyx, present on admission - wound care   Best practice (evaluated daily)  Diet: tube feeds DVT prophylaxis: eliquis on hold GI prophylaxis: protonix Mobility: bed rest Disposition: ICU Code Status: full code  Labs    CMP Latest Ref Rng & Units 04/21/2020 04/20/2020 04/20/2020  Glucose 70 - 99 mg/dL 144(H) - -  BUN 8 - 23 mg/dL 61(H) - -  Creatinine 0.44 - 1.00 mg/dL 4.25(H) - -  Sodium 135 - 145 mmol/L 137 - 137  Potassium 3.5 - 5.1 mmol/L 3.8 4.3 4.9  Chloride 98 - 111 mmol/L 100 - -  CO2 22 - 32 mmol/L 23 - -  Calcium 8.9 - 10.3 mg/dL 8.0(L) - -  Total Protein 6.5 - 8.1 g/dL - - -  Total Bilirubin 0.3 - 1.2 mg/dL - - -  Alkaline Phos 38 - 126 U/L - - -  AST 15 - 41 U/L - - -  ALT 0 - 44 U/L - - -    CBC Latest Ref Rng & Units 04/21/2020 04/20/2020 04/20/2020  WBC 4.0 - 10.5 K/uL 14.2(H) - -  Hemoglobin 12.0 - 15.0 g/dL 7.1(L) 8.5(L) 9.2(L)  Hematocrit 36.0 - 46.0 % 23.1(L) 25.0(L) 27.0(L)  Platelets 150 - 400 K/uL 234 - -    ABG    Component Value Date/Time   PHART 7.297 (L) 04/20/2020 1022   PCO2ART 51.8 (H) 04/20/2020 1022   PO2ART 335 (H) 04/20/2020 1022   HCO3 25.3 04/20/2020 1022   TCO2 27 04/20/2020 1022   ACIDBASEDEF 1.0 04/20/2020 1022   O2SAT 100.0 04/20/2020 1022    CBG (last 3)  Recent Labs    04/21/20 0406 04/21/20 0524 04/21/20 0737  GLUCAP 129* 124* 158*    Critical care time: 34 minutes  Chesley Mires, MD [place] Pager - [phone] 04/21/2020, 8:56 AM

## 2020-04-21 NOTE — Progress Notes (Signed)
ANTICOAGULATION CONSULT NOTE - Initial Consult  Pharmacy Consult for IV Heparin Indication: atrial fibrillation  Allergies  Allergen Reactions  . Quinolones     UNK reaction    Patient Measurements: Height: '5\' 7"'$  (170.2 cm) Weight: 81.1 kg (178 lb 12.7 oz) IBW/kg (Calculated) : 61.6 Heparin Dosing Weight: 78 kg  Vital Signs: Temp: 99.5 F (37.5 C) (03/19 2130) Temp Source: Bladder (03/19 2000) BP: 95/65 (03/19 2130) Pulse Rate: 99 (03/19 2130)  Labs: Recent Labs    04/19/20 0156 04/20/20 0222 04/20/20 0319 04/20/20 0607 04/20/20 1022 04/21/20 0316 04/21/20 2052  HGB 7.6* 7.3*  --  9.2* 8.5* 7.1*  --   HCT 24.8* 26.0*  --  27.0* 25.0* 23.1*  --   PLT 239 300  --   --   --  234  --   APTT  --   --   --   --   --   --  100*  HEPARINUNFRC  --   --   --   --   --   --  >2.20*  CREATININE 3.07*  --  3.95*  --   --  4.25*  --   TROPONINIHS  --  59*  --   --   --   --   --     Estimated Creatinine Clearance: 12.1 mL/min (A) (by C-G formula based on SCr of 4.25 mg/dL (H)).   Medical History: Past Medical History:  Diagnosis Date  . Diabetes mellitus without complication ([place])   . Dysrhythmia   . History of CVA (cerebrovascular accident) 04/15/2020  . Seizure disorder ([place]) 04/15/2020  . Stroke [facility])    Assessment: 80 years of age female on Eliquis prior to admission for atrial fibrillation who is now in acute renal failure and minimal to no urine output and Eliquis was held after discussion with Dr. Gasper Sells on 3/18 - at that time no plans to start Heparin per that verbal discussion. Last dose of Eliquis was on 3/18 at 09:33 AM. On 3/19, pharmacy consulted by Cardiology to start IV Heparin.   First APTT 100 at upper end of goal. HL not yet correlating with aPTT.    Goal of Therapy:  Heparin level 0.3-0.7 units/ml  APTT 66-102 sec  Monitor platelets by anticoagulation protocol: Yes   Plan:  Decrease heparin to 1050 units/hr F/u aPTT until correlates with  heparin level  Monitor daily aPTT, HL, CBC/plt Monitor for signs/symptoms of bleeding    Benetta Spar, PharmD, BCPS, BCCP Clinical Pharmacist  Please check AMION for all [place] phone numbers After 10:00 PM, call [place]

## 2020-04-21 NOTE — Progress Notes (Signed)
Spoke with pt's son at bedside.  Updated about current status and treatment plan.  Discussed goals of care.  He was appreciative of this information, but requested to continue aggressive therapies.  Chesley Mires, MD [place] Pager - [phone] 04/21/2020, 2:26 PM

## 2020-04-21 NOTE — Progress Notes (Signed)
ANTICOAGULATION CONSULT NOTE - Initial Consult  Pharmacy Consult for IV Heparin Indication: atrial fibrillation  Allergies  Allergen Reactions  . Quinolones     UNK reaction    Patient Measurements: Height: '5\' 7"'$  (170.2 cm) Weight: 81.1 kg (178 lb 12.7 oz) IBW/kg (Calculated) : 61.6 Heparin Dosing Weight: 78.2 kg  Vital Signs: Temp: 98.4 F (36.9 C) (03/19 1000) Temp Source: Bladder (03/19 0800) BP: 96/75 (03/19 1000) Pulse Rate: 104 (03/19 1000)  Labs: Recent Labs    04/18/20 1500 04/19/20 0156 04/19/20 0156 04/20/20 0222 04/20/20 0319 04/20/20 0607 04/20/20 1022 04/21/20 0316  HGB  --  7.6*   < > 7.3*  --  9.2* 8.5* 7.1*  HCT  --  24.8*   < > 26.0*  --  27.0* 25.0* 23.1*  PLT  --  239  --  300  --   --   --  234  CREATININE  --  3.07*  --   --  3.95*  --   --  4.25*  CKTOTAL 28*  --   --   --   --   --   --   --   TROPONINIHS  --   --   --  59*  --   --   --   --    < > = values in this interval not displayed.    Estimated Creatinine Clearance: 12.1 mL/min (A) (by C-G formula based on SCr of 4.25 mg/dL (H)).   Medical History: Past Medical History:  Diagnosis Date  . Diabetes mellitus without complication ([place])   . Dysrhythmia   . History of CVA (cerebrovascular accident) 04/15/2020  . Seizure disorder ([place]) 04/15/2020  . Stroke [facility])    Assessment: 80 years of age female on Eliquis prior to admission for atrial fibrillation who is now in acute renal failure and minimal to no urine output and Eliquis was held after discussion with Dr. Gasper Sells on 3/18 - at that time no plans to start Heparin per that verbal discussion. Last dose of Eliquis was on 3/18 at 09:33 AM. Today 3/19, pharmacy consulted by Cardiology to start IV Heparin.  Hemoglobin low at 7.1- stable (7-8's). Platelets are within normal limits. No overt bleeding noted. SCr continues to rise at 4.25 with minimal urine output. Renal ultrasound showing normal kidneys. Renal consulted - no urgent  need for dialysis at this time.   Due to recent Eliquis and effect on heparin levels, will monitor IV Heparin utilizing aPTT levels until levels are correlating.   Goal of Therapy:  Heparin level 0.3-0.7 units/ml Monitor platelets by anticoagulation protocol: Yes   Plan:  Start IV Heparin (no bolus with recent Eliquis, AKI and bleed risk) at 1100 units/hr.  APTT and Heparin level in 8 hours.  Daily aPTT, Heparin level, and CBC.   Sloan Leiter, PharmD, BCPS, BCCCP Clinical Pharmacist Please refer to [facility] for [place] numbers 04/21/2020,11:53 AM

## 2020-04-21 NOTE — Progress Notes (Signed)
   Progress Note  Patient Name: Rebecca Harrell Date of Encounter: 04/21/2020  Primary Cardiologist: Werner Lean, MD  Reviewed course and recent cardiology recommendations.  Heart rate and atrial fibrillation are reasonably well controlled at this time in the setting of acute illness.  Presently on diltiazem 60 mg every 6 hours per tube.  Eliquis was discontinued yesterday with plan to transition to heparin temporarily in light of progressive renal insufficiency.  This has not yet been ordered.  Pertinent lab work includes potassium 3.8, creatinine 4.25, hemoglobin 7.1 (relatively stable without acute drop), platelets 234.  Continue treatment measures per CCM service.  No change in Cardizem dosing for now.  Will start heparin per pharmacy as planned by Dr. Gasper Sells.  Signed, Rozann Lesches, MD  04/21/2020, 11:25 AM

## 2020-04-22 DIAGNOSIS — Z515 Encounter for palliative care: Secondary | ICD-10-CM | POA: Diagnosis not present

## 2020-04-22 DIAGNOSIS — I4891 Unspecified atrial fibrillation: Secondary | ICD-10-CM | POA: Diagnosis not present

## 2020-04-22 DIAGNOSIS — N179 Acute kidney failure, unspecified: Secondary | ICD-10-CM | POA: Diagnosis not present

## 2020-04-22 DIAGNOSIS — Z66 Do not resuscitate: Secondary | ICD-10-CM | POA: Diagnosis not present

## 2020-04-22 DIAGNOSIS — Z7189 Other specified counseling: Secondary | ICD-10-CM | POA: Diagnosis not present

## 2020-04-22 DIAGNOSIS — J9621 Acute and chronic respiratory failure with hypoxia: Secondary | ICD-10-CM | POA: Diagnosis not present

## 2020-04-22 LAB — GLUCOSE, CAPILLARY
Glucose-Capillary: 231 mg/dL — ABNORMAL HIGH (ref 70–99)
Glucose-Capillary: 212 mg/dL — ABNORMAL HIGH (ref 70–99)
Glucose-Capillary: 218 mg/dL — ABNORMAL HIGH (ref 70–99)
Glucose-Capillary: 240 mg/dL — ABNORMAL HIGH (ref 70–99)
Glucose-Capillary: 237 mg/dL — ABNORMAL HIGH (ref 70–99)
Glucose-Capillary: 214 mg/dL — ABNORMAL HIGH (ref 70–99)

## 2020-04-22 LAB — RENAL FUNCTION PANEL
Albumin: 1.7 g/dL — ABNORMAL LOW (ref 3.5–5.0)
Anion gap: 12 (ref 5–15)
BUN: 68 mg/dL — ABNORMAL HIGH (ref 8–23)
CO2: 23 mmol/L (ref 22–32)
Calcium: 7.9 mg/dL — ABNORMAL LOW (ref 8.9–10.3)
Chloride: 100 mmol/L (ref 98–111)
Creatinine, Ser: 4.11 mg/dL — ABNORMAL HIGH (ref 0.44–1.00)
GFR, Estimated: 11 mL/min — ABNORMAL LOW (ref >60)
Glucose, Bld: 251 mg/dL — ABNORMAL HIGH (ref 70–99)
Phosphorus: 5.4 mg/dL — ABNORMAL HIGH (ref 2.5–4.6)
Potassium: 4.4 mmol/L (ref 3.5–5.1)
Sodium: 135 mmol/L (ref 135–145)

## 2020-04-22 LAB — CBC
HCT: 21.1 % — ABNORMAL LOW (ref 36.0–46.0)
Hemoglobin: 6.4 g/dL — CL (ref 12.0–15.0)
MCH: 29 pg (ref 26.0–34.0)
MCHC: 30.3 g/dL (ref 30.0–36.0)
MCV: 95.5 fL (ref 80.0–100.0)
Platelets: 195 10*3/uL (ref 150–400)
RBC: 2.21 MIL/uL — ABNORMAL LOW (ref 3.87–5.11)
RDW: 17.3 % — ABNORMAL HIGH (ref 11.5–15.5)
WBC: 8.5 10*3/uL (ref 4.0–10.5)
nRBC: 0.4 % — ABNORMAL HIGH (ref 0.0–0.2)

## 2020-04-22 LAB — ABO/RH: ABO/RH(D): O POS

## 2020-04-22 LAB — PREPARE RBC (CROSSMATCH)

## 2020-04-22 LAB — APTT
aPTT: 115 seconds — ABNORMAL HIGH (ref 24–36)
aPTT: 76 seconds — ABNORMAL HIGH (ref 24–36)
aPTT: 89 seconds — ABNORMAL HIGH (ref 24–36)

## 2020-04-22 LAB — HEMOGLOBIN AND HEMATOCRIT, BLOOD
HCT: 24.4 % — ABNORMAL LOW (ref 36.0–46.0)
Hemoglobin: 7.7 g/dL — ABNORMAL LOW (ref 12.0–15.0)

## 2020-04-22 LAB — HEPARIN LEVEL (UNFRACTIONATED): Heparin Unfractionated: 1.94 IU/mL — ABNORMAL HIGH (ref 0.30–0.70)

## 2020-04-22 MED ORDER — SODIUM CHLORIDE 0.9% IV SOLUTION: Freq: Once | INTRAVENOUS | Status: AC

## 2020-04-22 MED ORDER — INSULIN ASPART 100 UNIT/ML ~~LOC~~ SOLN
6.0000 [IU] | SUBCUTANEOUS | Status: DC
  Administered 2020-04-22 – 2020-04-28 (×37): 6 [IU] via SUBCUTANEOUS

## 2020-04-22 MED ORDER — DILTIAZEM 12 MG/ML ORAL SUSPENSION
60.0000 mg | Freq: Three times a day (TID) | ORAL | Status: DC
  Administered 2020-04-22 – 2020-04-23 (×4): 60 mg
  Filled 2020-04-22 (×4): qty 6

## 2020-04-22 MED ORDER — LEVETIRACETAM 100 MG/ML PO SOLN
500.0000 mg | Freq: Two times a day (BID) | ORAL | Status: DC
  Administered 2020-04-22 – 2020-04-23 (×3): 500 mg
  Filled 2020-04-22 (×4): qty 5

## 2020-04-22 MED ORDER — INSULIN DETEMIR 100 UNIT/ML ~~LOC~~ SOLN
20.0000 [IU] | Freq: Two times a day (BID) | SUBCUTANEOUS | Status: DC
  Administered 2020-04-22 – 2020-04-23 (×3): 20 [IU] via SUBCUTANEOUS
  Filled 2020-04-22 (×5): qty 0.2

## 2020-04-22 MED ORDER — ALBUMIN HUMAN 25 % IV SOLN
25.0000 g | Freq: Four times a day (QID) | INTRAVENOUS | Status: AC
  Administered 2020-04-22: 25 g via INTRAVENOUS
  Filled 2020-04-22: qty 100

## 2020-04-22 NOTE — Progress Notes (Signed)
[place] for IV Heparin Indication: atrial fibrillation  Allergies  Allergen Reactions  . Quinolones     UNK reaction    Patient Measurements: Height: '5\' 7"'$  (170.2 cm) Weight: 84.5 kg (186 lb 4.6 oz) IBW/kg (Calculated) : 61.6 Heparin Dosing Weight: 78 kg  Vital Signs: Temp: 99.5 F (37.5 C) (03/20 1400) Temp Source: Bladder (03/20 1200) BP: 108/56 (03/20 1400) Pulse Rate: 115 (03/20 1400)  Labs: Recent Labs    04/20/20 0222 04/20/20 0319 04/20/20 [id number] 04/21/20 0316 04/21/20 2052 04/22/20 0341 04/22/20 1435  HGB 7.3*  --    < > 7.1*  --  6.4* 7.7*  HCT 26.0*  --    < > 23.1*  --  21.1* 24.4*  PLT 300  --   --  234  --  195  --   APTT  --   --   --   --  100* 115* 76*  HEPARINUNFRC  --   --   --   --  >2.20* 1.94*  --   CREATININE  --  3.95*  --  4.25*  --  4.11*  --   TROPONINIHS 59*  --   --   --   --   --   --    < > = values in this interval not displayed.    Estimated Creatinine Clearance: 12.8 mL/min (A) (by C-G formula based on SCr of 4.11 mg/dL (H)).  Assessment: 80 years of age female on Eliquis prior to admission for atrial fibrillation who is now in acute renal failure and minimal to no urine output and Eliquis was held after discussion with Dr. Gasper Sells on 3/18 - at that time no plans to start Heparin per that verbal discussion. Last dose of Eliquis was on 3/18 at 09:33 AM. On 3/19, pharmacy consulted by Cardiology to start IV Heparin.  PTT now therapeutic at 76 after reducing IV Heparin to 900 units/hr.  Hgb up to 7.7 after transfusing 1 unit pRBCs. No bleeding or issues with infusion per discussion with RN.   Goal of Therapy:  Heparin level 0.3-0.7 units/ml  APTT 66-102 sec  Monitor platelets by anticoagulation protocol: Yes   Plan:  Continue heparin to 900 units/hr Repeat aPTT in 8 hours to confirm.  Daily aPTT and HL until correlating. Daily CBC   Sloan Leiter, PharmD, BCPS, BCCCP Clinical  Pharmacist Please refer to [facility] for [place] numbers 04/22/2020 3:04 PM

## 2020-04-22 NOTE — Progress Notes (Signed)
[facility] Progress Note Patient Name: [name] DOB: 09-17-1940 MRN: [id number]   Date of Service  04/22/2020  HPI/Events of Note  Annemia - Hgb = 6.4.   eICU Interventions  Will transfuse 1 unit PRBC now.      Intervention Category Major Interventions: Other:  Lysle Dingwall 04/22/2020, 4:27 AM

## 2020-04-22 NOTE — Progress Notes (Signed)
[facility] NEPHROLOGY PROGRESS NOTE  Assessment/ Plan: Pt is a 80 y.o. yo female  with PMH of DM, HFpEF, CVA with right-sided hemiparesis/contracture, s/p right BKA, PEG tube, chronic respiratory failure status post tracheostomy, she was recently at [facility] where treated for HCAP developed A. fib with RVR, hypotension and was sent to ER.  #Acute kidney injury, oliguric: Suspect hemodynamically mediated AKI in the setting of A. fib with RVR, IV contrast, recent infection.  UA without proteinuria or microscopic hematuria.  Kidney ultrasound ruled out obstruction.   Not much urine output however creatinine remains stable today.The potassium level and volume status acceptable. I would not offer hemodialysis with her limited functional status at baseline and the likelihood that this would further compromise her overall quality of life.  Palliative care team is following.  Continue supportive management.  There is no urgent need for dialysis today. Ordered IV albumin today. Monitor urine output, daily lab.  #Hyperkalemia: Status post medical management.  Potassium level improved now.    #Acute on chronic hypoxic respiratory failure: Currently on vent in ICU.  PCCM is following.  #Severe sepsis/Healthcare associated pneumonia/septic shock: Treated with broad-spectrum antibiotics per primary team.  She was not on Levophed this morning.  Blood pressure running low.  Per PCCM team.   #A. fib with RVR: On Eliquis, diltiazem.  #Combined metabolic and respiratory acidosis: Improving with mechanical ventilation.  #Nutrition: She has hypoalbuminemia.  Ordering IV albumin to increase oncotic pressure and renal perfusion.  Subjective: Urine output recorded only 295 cc and there was 40 cc urine in the Foley bag.  Remains intubated and on vent.  No new event.  Off of pressors this morning. Objective Vital signs in last 24 hours: Vitals:   04/22/20 0749 04/22/20 0800 04/22/20 0832  04/22/20 0900  BP:  (!) 88/73 92/68 100/66  Pulse:  91 93 92  Resp:  '15 16 16  ' Temp:  98.96 F (37.2 C) 98.78 F (37.1 C) 98.78 F (37.1 C)  TempSrc:  Bladder Bladder   SpO2: 99% 97% 97% 96%  Weight:      Height:       Weight change: 3.4 kg  Intake/Output Summary (Last 24 hours) at 04/22/2020 1004 Last data filed at 04/22/2020 0900 Gross per 24 hour  Intake 2876.75 ml  Output 335 ml  Net 2541.75 ml       Labs: Basic Metabolic Panel: Recent Labs  Lab 04/20/20 0319 04/20/20 0510 04/20/20 1022 04/20/20 1446 04/21/20 0316 04/22/20 0341  NA 131*   < > 137  --  137 135  K 5.9*   < > 4.9 4.3 3.8 4.4  CL 98  --   --   --  100 100  CO2 17*  --   --   --  23 23  GLUCOSE 492*  --   --   --  144* 251*  BUN 56*  --   --   --  61* 68*  CREATININE 3.95*  --   --   --  4.25* 4.11*  CALCIUM 7.9*  --   --   --  8.0* 7.9*  PHOS 6.9*  --   --   --  5.1* 5.4*   < > = values in this interval not displayed.   Liver Function Tests: Recent Labs  Lab 04/15/20 1253 04/20/20 0319 04/21/20 0316 04/22/20 0341  AST 58*  --   --   --   ALT 25  --   --   --  ALKPHOS 75  --   --   --   BILITOT 1.1  --   --   --   PROT 8.0  --   --   --   ALBUMIN 2.3* 2.3* 1.9* 1.7*   No results for input(s): LIPASE, AMYLASE in the last 168 hours. No results for input(s): AMMONIA in the last 168 hours. CBC: Recent Labs  Lab 04/15/20 1253 04/16/20 0132 04/18/20 0418 04/19/20 0156 04/20/20 0222 04/20/20 0607 04/20/20 1022 04/21/20 0316 04/22/20 0341  WBC 6.9   < > 7.7 10.5 14.6*  --   --  14.2* 8.5  NEUTROABS 4.9  --   --   --   --   --   --   --   --   HGB 8.1*   < > 7.4* 7.6* 7.3*   < > 8.5* 7.1* 6.4*  HCT 27.2*   < > 25.1* 24.8* 26.0*   < > 25.0* 23.1* 21.1*  MCV 95.4   < > 94.7 95.0 100.4*  --   --  94.3 95.5  PLT 225   < > 234 239 300  --   --  234 195   < > = values in this interval not displayed.   Cardiac Enzymes: Recent Labs  Lab 04/18/20 1500  CKTOTAL 28*   CBG: Recent  Labs  Lab 04/21/20 1521 04/21/20 1922 04/21/20 2329 04/22/20 0335 04/22/20 0719  GLUCAP 231* 229* 245* 231* 212*    Iron Studies:  No results for input(s): IRON, TIBC, TRANSFERRIN, FERRITIN in the last 72 hours. Studies/Results: DG Chest Port 1 View  Result Date: 04/21/2020 CLINICAL DATA:  Respiratory failure EXAM: PORTABLE CHEST 1 VIEW COMPARISON:  CT 04/15/2020, radiograph 04/20/2020 FINDINGS: Endotracheal tube tip terminates in the mid trachea, 5 cm from the carina. Telemetry leads and external support devices overlie the chest. The patient's right hand overlies the right lung base as well. Diffuse heterogeneous opacities again seen throughout both lungs, with some interval clearing particularly in the left mid lung. No pneumothorax. Suspect layering left effusion. No visible right effusion. Additional bandlike opacities favoring subsegmental atelectasis. Stable cardiomediastinal contours with a calcified aorta. No acute osseous or soft tissue abnormality. IMPRESSION: Endotracheal tube tip terminates in the mid trachea, 5 cm from the carina. Improving bilateral airspace opacities may reflect some resolving edema or infection. Layering left effusion. Electronically Signed   By: Lovena Le M.D.   On: 04/21/2020 05:34    Medications: Infusions: . sodium chloride    . feeding supplement (OSMOLITE 1.5 CAL) 1,000 mL (04/21/20 2200)  . fentaNYL infusion INTRAVENOUS 50 mcg/hr (04/22/20 0900)  . heparin 900 Units/hr (04/22/20 0900)  . norepinephrine (LEVOPHED) Adult infusion Stopped (04/21/20 1026)    Scheduled Medications: . chlorhexidine gluconate (MEDLINE KIT)  15 mL Mouth Rinse BID  . Chlorhexidine Gluconate Cloth  6 each Topical Daily  . diltiazem  60 mg Per Tube Q6H  . feeding supplement (PROSource TF)  45 mL Per Tube Daily  . insulin aspart  0-15 Units Subcutaneous Q4H  . insulin aspart  4 Units Subcutaneous Q4H  . insulin detemir  18 Units Subcutaneous BID  . levETIRAcetam  750  mg Per Tube BID  . mouth rinse  15 mL Mouth Rinse 10 times per day  . pantoprazole sodium  40 mg Per Tube Q24H  . QUEtiapine  25 mg Per Tube QHS  . sodium chloride flush  10-40 mL Intracatheter Q12H    have reviewed scheduled and prn  medications.  Physical Exam: Unchanged from yesterday General: Critically ill looking female, not much responsive except painful stimuli, on mechanical ventilation,  Neck: tracheostomy presents Heart:RRR, s1s2 nl, no rub Lungs: Coarse breath sound bilateral Abdomen:soft,  non-distended Extremities: Contracture of upper extremities, right AKA.  Some edema present on left leg Neurology:  not responding   [place] 04/22/2020,10:04 AM  LOS: 7 days

## 2020-04-22 NOTE — Progress Notes (Addendum)
Palliative Medicine Inpatient Follow Up Note  Reason for consult:  Goals of Care  HPI:  Per intake H&P --> 80 year old female with past medical of hypertension, insulin-dependent diabetes mellitus, chronic diastolic CHF, seizure disorder, history of CVA with right-sided hemiparesis, chronic hypoxic respiratory failure, aspiration with tracheostomy and PEG tube dependence, paroxysmal atrial fibrillation recently taken off anticoagulation and currently under treatment with zosyn for pneumonia sent from [facility] with persistent tachycardia despite lopressor. Found to be in Afib with RVR and initially hypotensive, which resolved after 2L NS bolus. Patient afebrile and remains on ATC trial.   Palliative care has been asked to address goals of care in the setting of respiratory distress and AKI w/ anuria. Nephrology has seen and is not offering HD.  Today's Discussion (04/22/2020):  *Please note that this is a verbal dictation therefore any spelling or grammatical errors are due to the "[employer]" system interpretation.  I met at bedside this morning with patients sister and nephew. We reviewed her clinical course to date. Her sister is a retired Marine scientist. She shares understanding of the present situation though acknowledges that [name] is in denial about how seriously ill Ryonna is. We reviewed that Sophira has been sick for a very long time. Discussed the type of woman she was prior to all of this in terms of her intelligence and zest for life. We reviewed that she has "always on the move." Patients sister realizes the potential trauma of CPR and has shared these thoughts with [place] and [name].   I called [name] this afternoon I was able to provide her with an update. She is still not at a point where she would like to make any decisions regarding care de-escalation or change of code status.  I called patients son, Laurena Bering and provided him an update. He shares that he will be visiting in the  oncoming days.   Questions and concerns addressed   Objective Assessment: Vital Signs Vitals:   04/22/20 1130 04/22/20 1200  BP: (!) 91/41 101/63  Pulse: 97 (!) 104  Resp: 18 (!) 21  Temp: 99.14 F (37.3 C) 99.14 F (37.3 C)  SpO2: 96% 95%    Intake/Output Summary (Last 24 hours) at 04/22/2020 1428 Last data filed at 04/22/2020 1200 Gross per 24 hour  Intake 2052.07 ml  Output 340 ml  Net 1712.07 ml   Last Weight  Most recent update: 04/22/2020  1:30 AM   Weight  84.5 kg (186 lb 4.6 oz)           Gen:  Frail elderly AA F chronically ill appearing HEENT: dry mucous membranes, tracheostomy CV: Regular rate and rhythm  PULM: Ventilator ABD: soft/nontender  EXT: (+) LLE edema, RLE amputation Neuro: Somnolent  SUMMARY OF RECOMMENDATIONS Full Code / Full Scope of Care --> I strongly recommended consideration of DNAR. Patients daughter continues to fluctuate regarding this topic stating that the views of the medical team and her views differ.  As of present patients family would like to continue with all aggressive measures for Aramis to sustain life.  Patients family members have all been updated. There is ambivalence towards a formal family meeting of unclear reasoning. Patients sister states it is due to the disbelief the family remains to be in.   Unfortunately, Maheen has been chronically ill for so long that worry the magnitude of these conversations with her family are not carrying much weight.   Palliative care will follow incrementally and offer ongoing support.  Time Spent: 35 Greater than 50% of the time was spent in counseling and coordination of care ______________________________________________________________________________________ [place] Team Team Cell Phone: [phone] Please utilize secure chat with additional questions, if there is no response within 30 minutes please call the above phone  number  Palliative Medicine Team providers are available by phone from 7am to 7pm daily and can be reached through the team cell phone.  Should this patient require assistance outside of these hours, please call the patient's attending physician.

## 2020-04-22 NOTE — Progress Notes (Signed)
[place] for IV Heparin Indication: atrial fibrillation  Allergies  Allergen Reactions  . Quinolones     UNK reaction    Patient Measurements: Height: '5\' 7"'$  (170.2 cm) Weight: 84.5 kg (186 lb 4.6 oz) IBW/kg (Calculated) : 61.6 Heparin Dosing Weight: 78 kg  Vital Signs: Temp: 98.78 F (37.1 C) (03/20 0400) Temp Source: Bladder (03/20 0400) BP: 80/70 (03/20 0400) Pulse Rate: 92 (03/20 0400)  Labs: Recent Labs    04/20/20 0222 04/20/20 0319 04/20/20 0607 04/20/20 1022 04/21/20 0316 04/21/20 2052 04/22/20 0341  HGB 7.3*  --    < > 8.5* 7.1*  --  6.4*  HCT 26.0*  --    < > 25.0* 23.1*  --  21.1*  PLT 300  --   --   --  234  --  195  APTT  --   --   --   --   --  100* 115*  HEPARINUNFRC  --   --   --   --   --  >2.20*  --   CREATININE  --  3.95*  --   --  4.25*  --  4.11*  TROPONINIHS 59*  --   --   --   --   --   --    < > = values in this interval not displayed.    Estimated Creatinine Clearance: 12.8 mL/min (A) (by C-G formula based on SCr of 4.11 mg/dL (H)).  Assessment: 80 years of age female on Eliquis prior to admission for atrial fibrillation who is now in acute renal failure and minimal to no urine output and Eliquis was held after discussion with Dr. Gasper Sells on 3/18 - at that time no plans to start Heparin per that verbal discussion. Last dose of Eliquis was on 3/18 at 09:33 AM. On 3/19, pharmacy consulted by Cardiology to start IV Heparin.  PTT 115 sec (supratherapeutic) on gtt at 1050 units/hr. Heparin level 1.94 -remains affected by Eliquis. No bleeding noted per RN but Hgb down to 6.4 (MD ordered PRBC x 1)  Goal of Therapy:  Heparin level 0.3-0.7 units/ml  APTT 66-102 sec  Monitor platelets by anticoagulation protocol: Yes   Plan:  Decrease heparin to 900 units/hr F/u PTT in 8 hours   Sherlon Handing, PharmD, BCPS Please see amion for complete clinical pharmacist phone list 04/22/2020 4:55 AM

## 2020-04-22 NOTE — Plan of Care (Signed)
  Problem: Clinical Measurements: Goal: Will remain free from infection Outcome: Not Progressing Goal: Diagnostic test results will improve Outcome: Not Progressing Goal: Respiratory complications will improve Outcome: Not Progressing Goal: Cardiovascular complication will be avoided Outcome: Not Progressing   Problem: Activity: Goal: Risk for activity intolerance will decrease Outcome: Not Progressing   Problem: Nutrition: Goal: Adequate nutrition will be maintained Outcome: Progressing   Problem: Coping: Goal: Level of anxiety will decrease Outcome: Progressing   Problem: Pain Managment: Goal: General experience of comfort will improve Outcome: Progressing

## 2020-04-22 NOTE — Progress Notes (Signed)
ANTICOAGULATION CONSULT NOTE - Follow Up Consult  Pharmacy Consult for heparin Indication: atrial fibrillation  Labs: Recent Labs    04/20/20 0222 04/20/20 0319 04/20/20 0607 04/21/20 0316 04/21/20 0316 04/21/20 2052 04/22/20 0341 04/22/20 1435 04/22/20 2203  HGB 7.3*  --    < > 7.1*  --   --  6.4* 7.7*  --   HCT 26.0*  --    < > 23.1*  --   --  21.1* 24.4*  --   PLT 300  --   --  234  --   --  195  --   --   APTT  --   --   --   --    < > 100* 115* 76* 89*  HEPARINUNFRC  --   --   --   --   --  >2.20* 1.94*  --   --   CREATININE  --  3.95*  --  4.25*  --   --  4.11*  --   --   TROPONINIHS 59*  --   --   --   --   --   --   --   --    < > = values in this interval not displayed.    Assessment/Plan:  80yo female remains therapeutic on heparin. Will continue gtt at current rate of 900 units/hr and monitor daily labs.   Wynona Neat, PharmD, BCPS  04/22/2020,11:43 PM

## 2020-04-22 NOTE — Progress Notes (Addendum)
NAME:  Rebecca Harrell, MRN:  [id number], DOB:  Jan 13, 1941, LOS: 7 ADMISSION DATE:  04/15/2020, CONSULTATION DATE:  04/16/2020 REFERRING MD:  Dr. Myna Hidalgo, [facility] CHIEF COMPLAINT:  04/16/2020  Brief History:  70 yoF with chronic respiratory failure s/p trach sent from [facility] for Afib with RVR.  Patient is also being treated for pneumonia on zosyn pta.  CXR concerning for possible partially loculated effusion.  Pulmonary consulted for further evaluation.   Past Medical History:  HTN, DM, Chronic diastolic CHF, CVA with Rt side weakness, Seizure, Aspiration pneumonia, s/p trach and PEG, PAF  Significant Hospital Events:  3/13 Admitted [facility] 3/15 on trach collar 5L 28% 3/17 Worsening hypoxemia and increased secretions 3/18 PCCM called to see patient overnight secondary to increasing work of breathing and hypoxia secondary to possible aspiration event 3/19 air leak from trach >> improved after changing to pressure support; change to heparin gtt 3/20 transfuse 1 unit PRBC; off pressors  Consults:  Cardiology Palliative care Nephrology  Procedures:  Lurline Idol   Significant Diagnostic Tests:   3/13 CTA PE >> pulmonary edema, small b/p effusions, small loculated component in minor fissure and oblique fissure  3/13 CT abd/ pelvis >> No acute findings within the abdomen.  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  Tolerating pressure support.  Off pressors.  On fentanyl gtt.  Objective   Blood pressure (!) 85/61, pulse 81, temperature 98.96 F (37.2 C), resp. rate (!) 25, height '5\' 7"'$  (1.702 m), weight 84.5 kg, SpO2 99 %.    Vent Mode: PSV;CPAP FiO2 (%):  [40  %] 40 % Set Rate:  [25 bmp] 25 bmp Vt Set:  [400 mL] 400 mL PEEP:  [8 cmH20] 8 cmH20 Pressure Support:  [10 cmH20] 10 cmH20 Plateau Pressure:  [16 cmH20-20 cmH20] 20 cmH20   Intake/Output Summary (Last 24 hours) at 04/22/2020 0830 Last data filed at 04/22/2020 0600 Gross per 24 hour  Intake 2524.86 ml  Output 355 ml  Net 2169.86 ml   Filed Weights   04/20/20 0500 04/21/20 0320 04/22/20 0130  Weight: 81.1 kg 81.1 kg 84.5 kg     General - blinks with stimulation Eyes - pupils reactive ENT - trach site clean Cardiac - irregular Chest - equal breath sounds b/l, no wheezing or rales Abdomen - soft, non tender, + bowel sounds, G tube site clean Extremities - 1+ edema, s/p Rt AKA, Rt arm contracted Skin - sacral wound Neuro - doesn't follow commands  Resolved Hospital Problem list   VRE UTI, Hyperkalemia, Aspiration pneumonitis  Assessment & Plan:   Acute on chronic hypoxic respiratory failure from aspiration pneumonitis. Tracheostomy status. - cuff leak noted 3/19; seems to do better on pressure support - wean to TC as able - f/u CXR intermittently - trach care - prn BDs - goal SpO2 > 92%  Hypotension from hypovolemia and sedation. - even fluid balance  Loculated pleural effusions in minor fissure and oblique fissure. - nothing to be done for these at present - f/u CXR intermittently  Atrial fibrillation. - cardiology consulted - hold eliquis in setting of renal failure - transitioned to heparin gtt 3/19 - continue cardizem  Hx of  CVA with Rt hemiparesis. Seizure disorder. Vegetative state. - continue fentanyl gtt for RASS goal 0 and vent synchrony - continue keppra - seroquel 25 mg qhs add 3/19 - prn oxycodone, versed  AKI from ATN 2nd to hypoxia and hypotension. - baseline creatinine 0.96 from 04/15/20 - nephrology consulted - she is not an appropriate candidate for long term HD - f/u BMET, monitor urine outpt  Anemia of critical illness and chronic  disease. 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 - SSI with tube feed coverage and levemir 18 units bid  Goals of care. - appreciate assistance from palliative care team  Pressure injury. - stage 2 coccyx, present on admission - wound care  Best practice (evaluated daily)  Diet: tube feeds DVT prophylaxis: heparin gtt GI prophylaxis: protonix Mobility: bed rest Disposition: ICU Code Status: full code  Labs    CMP Latest Ref Rng & Units 04/22/2020 04/21/2020 04/20/2020  Glucose 70 - 99 mg/dL 251(H) 144(H) -  BUN 8 - 23 mg/dL 68(H) 61(H) -  Creatinine 0.44 - 1.00 mg/dL 4.11(H) 4.25(H) -  Sodium 135 - 145 mmol/L 135 137 -  Potassium 3.5 - 5.1 mmol/L 4.4 3.8 4.3  Chloride 98 - 111 mmol/L 100 100 -  CO2 22 - 32 mmol/L 23 23 -  Calcium 8.9 - 10.3 mg/dL 7.9(L) 8.0(L) -  Total Protein 6.5 - 8.1 g/dL - - -  Total Bilirubin 0.3 - 1.2 mg/dL - - -  Alkaline Phos 38 - 126 U/L - - -  AST 15 - 41 U/L - - -  ALT 0 - 44 U/L - - -    CBC Latest Ref Rng & Units 04/22/2020 04/21/2020 04/20/2020  WBC 4.0 - 10.5 K/uL 8.5 14.2(H) -  Hemoglobin 12.0 - 15.0 g/dL 6.4(LL) 7.1(L) 8.5(L)  Hematocrit 36.0 - 46.0 % 21.1(L) 23.1(L) 25.0(L)  Platelets 150 - 400 K/uL 195 234 -    ABG    Component Value Date/Time   PHART 7.297 (L) 04/20/2020 1022   PCO2ART 51.8 (H) 04/20/2020 1022   PO2ART 335 (H) 04/20/2020 1022   HCO3 25.3 04/20/2020 1022   TCO2 27 04/20/2020 1022   ACIDBASEDEF 1.0 04/20/2020 1022   O2SAT 100.0 04/20/2020 1022    CBG (last 3)  Recent Labs    04/21/20 2329 04/22/20 0335 04/22/20 0719  GLUCAP 245* 231* 212*    Critical care time: 34 minutes  Chesley Mires, MD [place] Pager - [phone] 04/22/2020, 8:30 AM

## 2020-04-22 NOTE — Progress Notes (Signed)
Date and time results received: 04/22/20 0414 (use smartphrase ".now" to insert current time)  Test: Hgb  Critical Value: 6.4  Name of Provider Notified: Elink  Orders Received? Or Actions Taken?: Orders Received - See Orders for details

## 2020-04-23 DIAGNOSIS — I4891 Unspecified atrial fibrillation: Secondary | ICD-10-CM | POA: Diagnosis not present

## 2020-04-23 DIAGNOSIS — J9621 Acute and chronic respiratory failure with hypoxia: Secondary | ICD-10-CM | POA: Diagnosis not present

## 2020-04-23 LAB — CBC
HCT: 24.6 % — ABNORMAL LOW (ref 36.0–46.0)
Hemoglobin: 7.4 g/dL — ABNORMAL LOW (ref 12.0–15.0)
MCH: 28.9 pg (ref 26.0–34.0)
MCHC: 30.1 g/dL (ref 30.0–36.0)
MCV: 96.1 fL (ref 80.0–100.0)
Platelets: 185 10*3/uL (ref 150–400)
RBC: 2.56 MIL/uL — ABNORMAL LOW (ref 3.87–5.11)
RDW: 17.5 % — ABNORMAL HIGH (ref 11.5–15.5)
WBC: 8.7 10*3/uL (ref 4.0–10.5)
nRBC: 0.2 % (ref 0.0–0.2)

## 2020-04-23 LAB — GLUCOSE, CAPILLARY
Glucose-Capillary: 176 mg/dL — ABNORMAL HIGH (ref 70–99)
Glucose-Capillary: 177 mg/dL — ABNORMAL HIGH (ref 70–99)
Glucose-Capillary: 197 mg/dL — ABNORMAL HIGH (ref 70–99)
Glucose-Capillary: 198 mg/dL — ABNORMAL HIGH (ref 70–99)
Glucose-Capillary: 216 mg/dL — ABNORMAL HIGH (ref 70–99)
Glucose-Capillary: 221 mg/dL — ABNORMAL HIGH (ref 70–99)

## 2020-04-23 LAB — CULTURE, RESPIRATORY W GRAM STAIN

## 2020-04-23 LAB — RENAL FUNCTION PANEL
Albumin: 2 g/dL — ABNORMAL LOW (ref 3.5–5.0)
Anion gap: 11 (ref 5–15)
BUN: 78 mg/dL — ABNORMAL HIGH (ref 8–23)
CO2: 24 mmol/L (ref 22–32)
Calcium: 8.1 mg/dL — ABNORMAL LOW (ref 8.9–10.3)
Chloride: 101 mmol/L (ref 98–111)
Creatinine, Ser: 4.17 mg/dL — ABNORMAL HIGH (ref 0.44–1.00)
GFR, Estimated: 10 mL/min — ABNORMAL LOW (ref >60)
Glucose, Bld: 203 mg/dL — ABNORMAL HIGH (ref 70–99)
Phosphorus: 6 mg/dL — ABNORMAL HIGH (ref 2.5–4.6)
Potassium: 4.9 mmol/L (ref 3.5–5.1)
Sodium: 136 mmol/L (ref 135–145)

## 2020-04-23 LAB — TYPE AND SCREEN
ABO/RH(D): O POS
Antibody Screen: NEGATIVE
Unit division: 0

## 2020-04-23 LAB — BPAM RBC
Blood Product Expiration Date: 202204212359
ISSUE DATE / TIME: 202203200549
Unit Type and Rh: 5100

## 2020-04-23 LAB — HEPARIN LEVEL (UNFRACTIONATED): Heparin Unfractionated: 0.92 IU/mL — ABNORMAL HIGH (ref 0.30–0.70)

## 2020-04-23 LAB — APTT: aPTT: 78 seconds — ABNORMAL HIGH (ref 24–36)

## 2020-04-23 MED ORDER — AMIODARONE HCL 200 MG PO TABS
400.0000 mg | ORAL_TABLET | Freq: Two times a day (BID) | ORAL | Status: AC
  Administered 2020-04-23 – 2020-04-29 (×13): 400 mg
  Filled 2020-04-23 (×13): qty 2

## 2020-04-23 MED ORDER — AMIODARONE HCL 200 MG PO TABS: 400.0000 mg | ORAL_TABLET | Freq: Two times a day (BID) | ORAL | Status: DC

## 2020-04-23 MED ORDER — MIDODRINE HCL 5 MG PO TABS
5.0000 mg | ORAL_TABLET | Freq: Three times a day (TID) | ORAL | Status: DC
  Administered 2020-04-23 – 2020-04-25 (×6): 5 mg
  Filled 2020-04-23 (×6): qty 1

## 2020-04-23 NOTE — Progress Notes (Signed)
Called RT at this time to come place patient back on ventilation. O2 saturations down to 85%. Patient visibly struggling to breath. Patient having significant secretions bright red. Continues to cough needing more suctioning. Writer ordered new inner cannulas, none at bedside outside of what's in new trach box. RT on the way at this time.

## 2020-04-23 NOTE — Progress Notes (Signed)
RT called to room due to pt desating into the 80s on ATC. Placed pt back on vent with current charted settings. Pt tolerating well at this time. Trach care done as well. RT will continue to monitor

## 2020-04-23 NOTE — Progress Notes (Signed)
**Rebecca Rebecca** Rebecca Rebecca  Assessment/ Plan: Pt is a 80 y.o. yo female  with PMH of DM, HFpEF, CVA with right-sided hemiparesis/contracture, s/p right BKA, PEG tube, chronic respiratory failure status post tracheostomy, she was recently at [facility] where treated for HCAP developed A. fib with RVR, hypotension and was sent to ER.  Acute kidney injury, oliguric: Suspect hemodynamically mediated AKI in the setting of A. fib with RVR, IV contrast, recent infection.  UA without proteinuria or microscopic hematuria.  Kidney ultrasound ruled out obstruction.   Not much urine output however creatinine remains stable today (seems that she is plateauing).The potassium level and volume status acceptable. I would not offer hemodialysis with her limited functional status at baseline and the likelihood that this would further compromise her overall quality of life. Palliative care team is following.  Continue supportive management.  There is no urgent need for dialysis today. Monitor urine output, daily lab.  Hyperkalemia: Status post medical management.  Potassium level improved now.    Hypotension -required levo today, midodrine to be started, cardizem adjusted. Per primary  Acute on chronic hypoxic respiratory failure: Currently on vent in ICU.  PCCM is following.  Severe sepsis/Healthcare associated pneumonia/septic shock: Treated with broad-spectrum antibiotics per primary team.  She was not on Levophed this morning.  Blood pressure running low.  Per PCCM team.   A. fib with RVR: On Eliquis, diltiazem.  Combined metabolic and respiratory acidosis: Improving with mechanical ventilation.  Nutrition: She has hypoalbuminemia.  Push protein  Subjective: Urine output recorded only 280 cc and there was 40 cc urine in the Foley bag.  No acute events, remains on vent. Placed on levo overnight Objective Vital signs in last 24 hours: Vitals:   04/23/20 0900 04/23/20 0930  04/23/20 1000 04/23/20 1152  BP: 112/75 102/75 (!) 103/57 107/84  Pulse: (!) 115 (!) 117 (!) 117 (!) 108  Resp: '15 14 14 15  ' Temp: 99.14 F (37.3 C) 99.32 F (37.4 C) 99.32 F (37.4 C)   TempSrc:   Bladder   SpO2: 97% 98% 98% 97%  Weight:      Height:       Weight change: 0.9 kg  Intake/Output Summary (Last 24 hours) at 04/23/2020 1200 Last data filed at 04/23/2020 1030 Gross per 24 hour  Intake 1803.64 ml  Output 405 ml  Net 1398.64 ml       Labs: Basic Metabolic Panel: Recent Labs  Lab 04/21/20 0316 04/22/20 0341 04/23/20 0351  NA 137 135 136  K 3.8 4.4 4.9  CL 100 100 101  CO2 '23 23 24  ' GLUCOSE 144* 251* 203*  BUN 61* 68* 78*  CREATININE 4.25* 4.11* 4.17*  CALCIUM 8.0* 7.9* 8.1*  PHOS 5.1* 5.4* 6.0*   Liver Function Tests: Recent Labs  Lab 04/21/20 0316 04/22/20 0341 04/23/20 0351  ALBUMIN 1.9* 1.7* 2.0*   No results for input(s): LIPASE, AMYLASE in the last 168 hours. No results for input(s): AMMONIA in the last 168 hours. CBC: Recent Labs  Lab 04/19/20 0156 04/20/20 0222 04/20/20 0607 04/21/20 0316 04/22/20 0341 04/22/20 1435 04/23/20 0351  WBC 10.5 14.6*  --  14.2* 8.5  --  8.7  HGB 7.6* 7.3*   < > 7.1* 6.4* 7.7* 7.4*  HCT 24.8* 26.0*   < > 23.1* 21.1* 24.4* 24.6*  MCV 95.0 100.4*  --  94.3 95.5  --  96.1  PLT 239 300  --  234 195  --  185   < > =  values in this interval not displayed.   Cardiac Enzymes: Recent Labs  Lab 04/18/20 1500  CKTOTAL 28*   CBG: Recent Labs  Lab 04/22/20 1954 04/22/20 2254 04/23/20 0317 04/23/20 0729 04/23/20 1108  GLUCAP 237* 214* 176* 177* 198*    Iron Studies:  No results for input(s): IRON, TIBC, TRANSFERRIN, FERRITIN in the last 72 hours. Studies/Results: No results found.  Medications: Infusions: . sodium chloride    . feeding supplement (OSMOLITE 1.5 CAL) 1,000 mL (04/22/20 1717)  . fentaNYL infusion INTRAVENOUS 50 mcg/hr (04/23/20 1030)  . heparin 900 Units/hr (04/23/20 1030)  .  norepinephrine (LEVOPHED) Adult infusion 3 mcg/min (04/23/20 1030)    Scheduled Medications: . chlorhexidine gluconate (MEDLINE KIT)  15 mL Mouth Rinse BID  . Chlorhexidine Gluconate Cloth  6 each Topical Daily  . diltiazem  60 mg Per Tube Q8H  . feeding supplement (PROSource TF)  45 mL Per Tube Daily  . insulin aspart  0-15 Units Subcutaneous Q4H  . insulin aspart  6 Units Subcutaneous Q4H  . insulin detemir  20 Units Subcutaneous BID  . levETIRAcetam  500 mg Per Tube BID  . mouth rinse  15 mL Mouth Rinse 10 times per day  . midodrine  5 mg Per Tube Q8H  . pantoprazole sodium  40 mg Per Tube Q24H  . QUEtiapine  25 mg Per Tube QHS  . sodium chloride flush  10-40 mL Intracatheter Q12H    have reviewed scheduled and prn medications.  Physical Exam: Unchanged from yesterday General: Critically ill looking female, not much responsive except painful stimuli, on mechanical ventilation,  Neck: tracheostomy presents Heart:RRR, s1s2 nl, no rub Lungs: Coarse breath sound bilateral Abdomen:soft,  non-distended Extremities: Contracture of upper extremities, right AKA.  Some edema present on left leg Neurology:  not responding   Gean Quint 04/23/2020,12:00 PM  LOS: 8 days

## 2020-04-23 NOTE — Progress Notes (Signed)
RT NOTE: patient placed on 40% ATC.  Currently tolerating well.  Will continue to monitor.

## 2020-04-23 NOTE — Progress Notes (Addendum)
Progress Note  Patient Name: Rebecca Harrell Date of Encounter: 04/23/2020  [facility] Cardiologist: Werner Lean, MD     Subjective   Was not alert to answer any question. Sedated  Inpatient Medications    Scheduled Meds: . chlorhexidine gluconate (MEDLINE KIT)  15 mL Mouth Rinse BID  . Chlorhexidine Gluconate Cloth  6 each Topical Daily  . diltiazem  60 mg Per Tube Q8H  . feeding supplement (PROSource TF)  45 mL Per Tube Daily  . insulin aspart  0-15 Units Subcutaneous Q4H  . insulin aspart  6 Units Subcutaneous Q4H  . insulin detemir  20 Units Subcutaneous BID  . levETIRAcetam  500 mg Per Tube BID  . mouth rinse  15 mL Mouth Rinse 10 times per day  . midodrine  5 mg Per Tube Q8H  . pantoprazole sodium  40 mg Per Tube Q24H  . QUEtiapine  25 mg Per Tube QHS  . sodium chloride flush  10-40 mL Intracatheter Q12H   Continuous Infusions: . sodium chloride    . feeding supplement (OSMOLITE 1.5 CAL) 1,000 mL (04/23/20 1200)  . fentaNYL infusion INTRAVENOUS 50 mcg/hr (04/23/20 1300)  . heparin 900 Units/hr (04/23/20 1300)  . norepinephrine (LEVOPHED) Adult infusion 10 mcg/min (04/23/20 1300)   PRN Meds: acetaminophen, fentaNYL, ipratropium-albuterol, midazolam, ondansetron (ZOFRAN) IV, oxyCODONE, sodium chloride flush   Vital Signs    Vitals:   04/23/20 1230 04/23/20 1245 04/23/20 1330 04/23/20 1345  BP: (!) 101/22 (!) 115/51 106/83 102/74  Pulse: (!) 116 (!) 116 (!) 115 (!) 115  Resp: 20 20 (!) 22 19  Temp: (!) 100.76 F (38.2 C) (!) 100.94 F (38.3 C) (!) 100.94 F (38.3 C) (!) 101.12 F (38.4 C)  TempSrc:      SpO2: 98% 97% 97% 98%  Weight:      Height:        Intake/Output Summary (Last 24 hours) at 04/23/2020 1402 Last data filed at 04/23/2020 1300 Gross per 24 hour  Intake 1979.25 ml  Output 405 ml  Net 1574.25 ml   Last 3 Weights 04/23/2020 04/22/2020 04/21/2020  Weight (lbs) 188 lb 4.4 oz 186 lb 4.6 oz 178 lb 12.7 oz  Weight (kg) 85.4 kg 84.5  kg 81.1 kg      Telemetry    Atrial flutter with RVR HR 110s - Personally Reviewed  ECG    2:1 atrial flutter - Personally Reviewed  Physical Exam   GEN: intubated and trached.   Neck: trach Cardiac: tachycardic, no murmurs, rubs, or gallops.  Respiratory: Clear to auscultation bilaterally anterior exam. GI: Soft, nontender, non-distended  MS: No edema. R BKA Neuro:  unable to assess, sedated Psych: intubated via trach  Labs    High Sensitivity Troponin:   Recent Labs  Lab 04/20/20 0222  TROPONINIHS 59*      Chemistry Recent Labs  Lab 04/21/20 0316 04/22/20 0341 04/23/20 0351  NA 137 135 136  K 3.8 4.4 4.9  CL 100 100 101  CO2 '23 23 24  ' GLUCOSE 144* 251* 203*  BUN 61* 68* 78*  CREATININE 4.25* 4.11* 4.17*  CALCIUM 8.0* 7.9* 8.1*  ALBUMIN 1.9* 1.7* 2.0*  GFRNONAA 10* 11* 10*  ANIONGAP '14 12 11     ' Hematology Recent Labs  Lab 04/21/20 0316 04/22/20 0341 04/22/20 1435 04/23/20 0351  WBC 14.2* 8.5  --  8.7  RBC 2.45* 2.21*  --  2.56*  HGB 7.1* 6.4* 7.7* 7.4*  HCT 23.1* 21.1* 24.4* 24.6*  MCV 94.3 95.5  --  96.1  MCH 29.0 29.0  --  28.9  MCHC 30.7 30.3  --  30.1  RDW 16.7* 17.3*  --  17.5*  PLT 234 195  --  185    BNP Recent Labs  Lab 04/20/20 0431  BNP 1,070.8*     DDimer No results for input(s): DDIMER in the last 168 hours.   Radiology    No results found.  Cardiac Studies   Echo 04/16/2020 1. Left ventricular ejection fraction, by estimation, is 55 to 60%. The  left ventricle has normal function. The left ventricle has no regional  wall motion abnormalities. Left ventricular diastolic function could not  be evaluated.  2. Right ventricular systolic function is normal. The right ventricular  size is normal. There is moderately elevated pulmonary artery systolic  pressure.  3. Left atrial size was mildly dilated.  4. The mitral valve is grossly normal. Mild to moderate mitral valve  regurgitation.  5. Tricuspid valve  regurgitation is moderate.  6. The aortic valve is calcified. There is mild calcification of the  aortic valve. There is mild thickening of the aortic valve. Aortic valve  regurgitation is mild.  7. Aortic dilatation noted. There is mild dilatation of the ascending  aorta, measuring 39 mm.   Patient Profile     80 y.o. female with PMH of DM, HFpEF, CVA , R BKA, peg tube, and chronic respiratory failure s/p trach who was treated for HCAP however developed afib with RVR and hypotension and send to ED.   Assessment & Plan    1. Atrial flutter with RVR  - patient has h/o PAF dating back to 2010 not on anticoagulation for unclear reason  - CHA2DS2-Vasc score 7 (age, CVA, HTN, DM and female)  - Echo 04/16/2020 EF 55-60%, no RWMA, moderate PA pressure, mild to moderate MR, mild AI and aortic dilatation at 39 mm  - HR uncontrolled this morning in the 110s, although EKG shows possible sinus tachycardia, I question if the patient is locked into 2:1 atrial flutter pattern, MD to review tele.     - continue IV heparin. Rate control limited by hypotension. Diltiazem oral suspension lowered from 15m q6Hr down to q8Hr via tube. May need to consider addition of amiodarone, will defer to MD.   2. AKI: arrived with Cr 09, trended up to 4.1 during this admission. Renal function appears to have reached plateau of 4.1, hopefully began to improve. Suspect renal dysfunction driven by tachycardia and septic shock  3. Hypotension: on IV norepi and oral midodrine.   4. Severe septic shock with PNA: on norepi and IV fentanyl. Received oral midodrine this morning.         For questions or updates, please contact [email or web address] consult [email or web address] for contact info under        Signed, HAlmyra Deforest PHewitt 04/23/2020, 2:02 PM    Patient seen and examined with HAlmyra DeforestPA.  Agree as above, with the following exceptions and changes as noted below. Sedated, trach in place. Cannot participate in exam. Gen:  sedated, CV: regular tachycardia, no murmurs, Lungs: coarse bilaterally, Abd: soft, Extrem: cold, R leg amputated, no edema, Neuro/Psych:unable to assess. All available labs, radiology testing, previous records reviewed.  Very challenging situation with chronically ill 80year old female.  Agree that her telemetry and more so her ECG appears 2-1 atrial flutter.  Rates are suboptimally controlled at this time, however she is on norepinephrine and midodrine for  blood pressure support.  There may be a role for using amiodarone.  Since pulmonary critical care is involved, would have them also review if amiodarone is a reasonable pursuit.  If so, could consider loading with amiodarone 400 mg twice daily for a 5 g load.  This can be done through PEG tube if possible, otherwise IV is reasonable for continuous infusion.  This may also help avoid use of AV nodal blocking agents which may be contributing to hypotension.  Agree with palliative care involvement, to help define goals of treatment.    Elouise Munroe, MD 04/23/20 2:50 PM

## 2020-04-23 NOTE — Progress Notes (Signed)
NAME:  Rebecca Harrell, MRN:  [id number], DOB:  [birth year], LOS: 8 ADMISSION DATE:  04/15/2020, CONSULTATION DATE:  04/16/2020 REFERRING MD:  Dr. Myna Hidalgo, [facility] CHIEF COMPLAINT:  04/16/2020  Brief History:  81 yoF with chronic respiratory failure s/p trach sent from [facility] for Afib with RVR.  Patient is also being treated for pneumonia on zosyn pta.  CXR concerning for possible partially loculated effusion.  Pulmonary consulted for further evaluation.   Past Medical History:  HTN, DM, Chronic diastolic CHF, CVA with Rt side weakness, Seizure, Aspiration pneumonia, s/p trach and PEG, PAF  Significant Hospital Events:  3/13 Admitted [place] 3/15 on trach collar 5L 28% 3/17 Worsening hypoxemia and increased secretions 3/18 PCCM called to see patient overnight secondary to increasing work of breathing and hypoxia secondary to possible aspiration event 3/19 air leak from trach >> improved after changing to pressure support; change to heparin gtt 3/20 transfuse 1 unit PRBC; off pressors 3/21 back on pressors; start midodrine and reduce cardizem dose  Consults:  Cardiology Palliative care Nephrology  Procedures:  Lurline Idol   Significant Diagnostic Tests:   3/13 CTA PE >> pulmonary edema, small b/p effusions, small loculated component in minor fissure and oblique fissure  3/13 CT abd/ pelvis >> No acute findings within the abdomen.  3/16 renal u/s >> cholelithiasis, normal kidneys  Micro Data:  3/13 BCx>>negative 3/13 SARS 2/ flu >> negative 3/13 UCx >> 100k Vanc resistant enterococcus 3/14 MRSA >> negative 3/17 sputum >> Pseudomonas aeruginosa (resistant to fortaz and imipenem)  Antimicrobials:  Vancomycin 3/13 >> 3/14 Zosyn 3/13 Zithromax 3/13 >> 3/17 Linezolid 3/15 >> 3/17 Cefepime 3/13 >> 3/19  Interim History / Subjective:  On pressure support.  Objective   Blood pressure (!) 98/49, pulse (!) 116, temperature 98.78 F (37.1 C), temperature source Bladder, resp. rate 13, height 5'  7" (1.702 m), weight 85.4 kg, SpO2 97 %.    Vent Mode: PRVC FiO2 (%):  [40 %] 40 % Set Rate:  [25 bmp] 25 bmp Vt Set:  [400 mL] 400 mL PEEP:  [5 cmH20-8 cmH20] 8 cmH20 Pressure Support:  [10 cmH20] 10 cmH20 Plateau Pressure:  [20 cmH20-27 cmH20] 27 cmH20   Intake/Output Summary (Last 24 hours) at 04/23/2020 [id number] Last data filed at 04/23/2020 0800 Gross per 24 hour  Intake 1955.57 ml  Output 480 ml  Net 1475.57 ml   Filed Weights   04/21/20 0320 04/22/20 0130 04/23/20 0417  Weight: 81.1 kg 84.5 kg 85.4 kg     General - blinks with stimulaiton Eyes - pupils reactive ENT - trach site clean Cardiac - irregular Chest - scattered rhonchi Abdomen - soft, G tube site clean Extremities - Rt arm contracted, s/p Rt AKA Skin - sacral wound Neuro - doesn't follow commands  Resolved Hospital Problem list   VRE UTI, Hyperkalemia, Aspiration pneumonitis with drug resistant Pseudomonas aeruginosa  Assessment & Plan:   Acute on chronic hypoxic respiratory failure from aspiration pneumonitis. Tracheostomy status. - cuff leak noted 3/19; seems to do better on pressure support - wean to TC as able - f/u CXR intermittently - trach care - prn BDs - goal SpO2 > 92%  Hypotension from hypovolemia and sedation. - even fluid balance - add midodrine 3/21  Loculated pleural effusions in minor fissure and oblique fissure. - nothing to be done for these at present - f/u CXR intermittently  Atrial fibrillation. - cardiology consulted - hold eliquis in setting of renal failure - transitioned to heparin gtt 3/19 -  cardizem decreased to 60 mg q8h on 3/21  Hx of CVA with Rt hemiparesis. Seizure disorder. Vegetative state. - continue fentanyl gtt for RASS goal 0 and vent synchrony - continue keppra, seroquel - prn oxycodone, versed  AKI from ATN 2nd to hypoxia and hypotension. - baseline creatinine 0.96 from 04/15/20 - nephrology consulted - no indication for acute dialysis at this  time; she is not an appropriate candidate for long term HD - f/u BMET, monitor urine outpt  Anemia of critical illness and chronic disease. 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 - SSI with tube feed coverage and levemir 20 units bid  Goals of care. - appreciate assistance from palliative care team  Pressure injury. - stage 2 coccyx, present on admission - wound care  Best practice (evaluated daily)  Diet: tube feeds DVT prophylaxis: heparin gtt GI prophylaxis: protonix Mobility: bed rest Disposition: ICU Code Status: full code  Labs    CMP Latest Ref Rng & Units 04/23/2020 04/22/2020 04/21/2020  Glucose 70 - 99 mg/dL 203(H) 251(H) 144(H)  BUN 8 - 23 mg/dL 78(H) 68(H) 61(H)  Creatinine 0.44 - 1.00 mg/dL 4.17(H) 4.11(H) 4.25(H)  Sodium 135 - 145 mmol/L 136 135 137  Potassium 3.5 - 5.1 mmol/L 4.9 4.4 3.8  Chloride 98 - 111 mmol/L 101 100 100  CO2 22 - 32 mmol/L '24 23 23  '$ Calcium 8.9 - 10.3 mg/dL 8.1(L) 7.9(L) 8.0(L)  Total Protein 6.5 - 8.1 g/dL - - -  Total Bilirubin 0.3 - 1.2 mg/dL - - -  Alkaline Phos 38 - 126 U/L - - -  AST 15 - 41 U/L - - -  ALT 0 - 44 U/L - - -    CBC Latest Ref Rng & Units 04/23/2020 04/22/2020 04/22/2020  WBC 4.0 - 10.5 K/uL 8.7 - 8.5  Hemoglobin 12.0 - 15.0 g/dL 7.4(L) 7.7(L) 6.4(LL)  Hematocrit 36.0 - 46.0 % 24.6(L) 24.4(L) 21.1(L)  Platelets 150 - 400 K/uL 185 - 195    ABG    Component Value Date/Time   PHART 7.297 (L) 04/20/2020 1022   PCO2ART 51.8 (H) 04/20/2020 1022   PO2ART 335 (H) 04/20/2020 1022   HCO3 25.3 04/20/2020 1022   TCO2 27 04/20/2020 1022   ACIDBASEDEF 1.0 04/20/2020 1022   O2SAT 100.0 04/20/2020 1022    CBG (last 3)  Recent Labs    04/22/20 2254 04/23/20 0317 04/23/20 0729  GLUCAP 214* 176* 177*    Critical care time: 34 minutes  Chesley Mires, MD [place] Pager - [phone] 04/23/2020, 9:07 AM

## 2020-04-23 NOTE — Progress Notes (Signed)
RT going to attempt to leave pt on 40% ATC throughout night as long as tolerated.

## 2020-04-23 NOTE — Progress Notes (Signed)
[place] for IV Heparin Indication: atrial fibrillation  Allergies  Allergen Reactions  . Quinolones     UNK reaction    Patient Measurements: Height: '5\' 7"'$  (170.2 cm) Weight: 85.4 kg (188 lb 4.4 oz) IBW/kg (Calculated) : 61.6 Heparin Dosing Weight: 78 kg  Vital Signs: Temp: 98.78 F (37.1 C) (03/21 0800) Temp Source: Bladder (03/21 0700) BP: 98/49 (03/21 0800) Pulse Rate: 116 (03/21 0800)  Labs: Recent Labs    04/21/20 0316 04/21/20 0316 04/21/20 2052 04/22/20 0341 04/22/20 1435 04/22/20 2203 04/23/20 0351  HGB 7.1*  --   --  6.4* 7.7*  --  7.4*  HCT 23.1*  --   --  21.1* 24.4*  --  24.6*  PLT 234  --   --  195  --   --  185  APTT  --    < > 100* 115* 76* 89* 78*  HEPARINUNFRC  --   --  >2.20* 1.94*  --   --  0.92*  CREATININE 4.25*  --   --  4.11*  --   --  4.17*   < > = values in this interval not displayed.    Estimated Creatinine Clearance: 12.7 mL/min (A) (by C-G formula based on SCr of 4.17 mg/dL (H)).  Assessment: 80 years of age female on Eliquis prior to admission for atrial fibrillation who is now in acute renal failure and minimal to no urine output and Eliquis was held after discussion with Dr. Gasper Sells on 3/18 - at that time no plans to start Heparin per that verbal discussion. Last dose of Eliquis was on 3/18 at 09:33 AM. On 3/19, pharmacy consulted by Cardiology to dose IV Heparin.  APTT therapeutic; no bleeding reported.  Heparin level is falsely elevated due to Eliquis.  Goal of Therapy:  Heparin level 0.3-0.7 units/ml  APTT 66-102 sec  Monitor platelets by anticoagulation protocol: Yes   Plan:  Continue heparin gtt at 900 units/hr Daily heparin level, aPTT and CBC  Victory Dresden D. Mina Marble, PharmD, BCPS, [place] 04/23/2020, 8:33 AM

## 2020-04-24 DIAGNOSIS — J9621 Acute and chronic respiratory failure with hypoxia: Secondary | ICD-10-CM | POA: Diagnosis not present

## 2020-04-24 DIAGNOSIS — I4891 Unspecified atrial fibrillation: Secondary | ICD-10-CM | POA: Diagnosis not present

## 2020-04-24 LAB — RENAL FUNCTION PANEL
Albumin: 1.9 g/dL — ABNORMAL LOW (ref 3.5–5.0)
Anion gap: 8 (ref 5–15)
BUN: 86 mg/dL — ABNORMAL HIGH (ref 8–23)
CO2: 24 mmol/L (ref 22–32)
Calcium: 8.2 mg/dL — ABNORMAL LOW (ref 8.9–10.3)
Chloride: 101 mmol/L (ref 98–111)
Creatinine, Ser: 4.21 mg/dL — ABNORMAL HIGH (ref 0.44–1.00)
GFR, Estimated: 10 mL/min — ABNORMAL LOW (ref >60)
Glucose, Bld: 248 mg/dL — ABNORMAL HIGH (ref 70–99)
Phosphorus: 6.6 mg/dL — ABNORMAL HIGH (ref 2.5–4.6)
Potassium: 5.8 mmol/L — ABNORMAL HIGH (ref 3.5–5.1)
Sodium: 133 mmol/L — ABNORMAL LOW (ref 135–145)

## 2020-04-24 LAB — GLUCOSE, CAPILLARY
Glucose-Capillary: 211 mg/dL — ABNORMAL HIGH (ref 70–99)
Glucose-Capillary: 211 mg/dL — ABNORMAL HIGH (ref 70–99)
Glucose-Capillary: 214 mg/dL — ABNORMAL HIGH (ref 70–99)
Glucose-Capillary: 216 mg/dL — ABNORMAL HIGH (ref 70–99)
Glucose-Capillary: 197 mg/dL — ABNORMAL HIGH (ref 70–99)

## 2020-04-24 LAB — CBC
HCT: 23.6 % — ABNORMAL LOW (ref 36.0–46.0)
Hemoglobin: 7.2 g/dL — ABNORMAL LOW (ref 12.0–15.0)
MCH: 29 pg (ref 26.0–34.0)
MCHC: 30.5 g/dL (ref 30.0–36.0)
MCV: 95.2 fL (ref 80.0–100.0)
Platelets: 172 10*3/uL (ref 150–400)
RBC: 2.48 MIL/uL — ABNORMAL LOW (ref 3.87–5.11)
RDW: 17.2 % — ABNORMAL HIGH (ref 11.5–15.5)
WBC: 9.9 10*3/uL (ref 4.0–10.5)
nRBC: 0 % (ref 0.0–0.2)

## 2020-04-24 LAB — BASIC METABOLIC PANEL
Anion gap: 9 (ref 5–15)
BUN: 90 mg/dL — ABNORMAL HIGH (ref 8–23)
CO2: 24 mmol/L (ref 22–32)
Calcium: 8.2 mg/dL — ABNORMAL LOW (ref 8.9–10.3)
Chloride: 101 mmol/L (ref 98–111)
Creatinine, Ser: 4.27 mg/dL — ABNORMAL HIGH (ref 0.44–1.00)
GFR, Estimated: 10 mL/min — ABNORMAL LOW (ref >60)
Glucose, Bld: 236 mg/dL — ABNORMAL HIGH (ref 70–99)
Potassium: 5.5 mmol/L — ABNORMAL HIGH (ref 3.5–5.1)
Sodium: 134 mmol/L — ABNORMAL LOW (ref 135–145)

## 2020-04-24 LAB — APTT: aPTT: 80 seconds — ABNORMAL HIGH (ref 24–36)

## 2020-04-24 LAB — POTASSIUM: Potassium: 5.6 mmol/L — ABNORMAL HIGH (ref 3.5–5.1)

## 2020-04-24 LAB — HEPARIN LEVEL (UNFRACTIONATED): Heparin Unfractionated: 0.54 IU/mL (ref 0.30–0.70)

## 2020-04-24 MED ORDER — FENTANYL CITRATE (PF) 100 MCG/2ML IJ SOLN
25.0000 ug | INTRAMUSCULAR | Status: DC | PRN
  Administered 2020-04-24 – 2020-04-26 (×3): 100 ug via INTRAVENOUS
  Administered 2020-05-01 – 2020-05-06 (×3): 50 ug via INTRAVENOUS
  Administered 2020-05-08 – 2020-05-12 (×4): 100 ug via INTRAVENOUS
  Filled 2020-04-24 (×10): qty 2

## 2020-04-24 MED ORDER — DEXMEDETOMIDINE HCL IN NACL 400 MCG/100ML IV SOLN: 0.0000 ug/kg/h | INTRAVENOUS | Status: DC

## 2020-04-24 MED ORDER — SODIUM ZIRCONIUM CYCLOSILICATE 10 G PO PACK
10.0000 g | PACK | Freq: Once | ORAL | Status: AC
  Administered 2020-04-24: 10 g via ORAL
  Filled 2020-04-24: qty 1

## 2020-04-24 MED ORDER — LEVETIRACETAM 100 MG/ML PO SOLN
500.0000 mg | Freq: Every day | ORAL | Status: DC
  Administered 2020-04-24 – 2020-05-05 (×12): 500 mg
  Filled 2020-04-24 (×14): qty 5

## 2020-04-24 MED ORDER — INSULIN DETEMIR 100 UNIT/ML ~~LOC~~ SOLN
25.0000 [IU] | Freq: Two times a day (BID) | SUBCUTANEOUS | Status: DC
  Administered 2020-04-24 – 2020-04-28 (×10): 25 [IU] via SUBCUTANEOUS
  Filled 2020-04-24 (×12): qty 0.25

## 2020-04-24 MED ORDER — SODIUM ZIRCONIUM CYCLOSILICATE 10 G PO PACK
10.0000 g | PACK | Freq: Once | ORAL | Status: AC
  Administered 2020-04-24: 10 g
  Filled 2020-04-24: qty 1

## 2020-04-24 MED ORDER — FUROSEMIDE 10 MG/ML IJ SOLN
80.0000 mg | Freq: Once | INTRAMUSCULAR | Status: AC
  Administered 2020-04-24: 80 mg via INTRAVENOUS
  Filled 2020-04-24: qty 8

## 2020-04-24 NOTE — Progress Notes (Addendum)
Progress Note  Patient Name: Rebecca Harrell Date of Encounter: 04/24/2020  [facility] Cardiologist: Werner Lean, MD   Subjective   Eye open to sound, does not respond to questions  Inpatient Medications    Scheduled Meds: . amiodarone  400 mg Per Tube BID  . chlorhexidine gluconate (MEDLINE KIT)  15 mL Mouth Rinse BID  . Chlorhexidine Gluconate Cloth  6 each Topical Daily  . feeding supplement (PROSource TF)  45 mL Per Tube Daily  . insulin aspart  0-15 Units Subcutaneous Q4H  . insulin aspart  6 Units Subcutaneous Q4H  . insulin detemir  25 Units Subcutaneous BID  . levETIRAcetam  500 mg Per Tube QHS  . mouth rinse  15 mL Mouth Rinse 10 times per day  . midodrine  5 mg Per Tube Q8H  . pantoprazole sodium  40 mg Per Tube Q24H  . QUEtiapine  25 mg Per Tube QHS  . sodium chloride flush  10-40 mL Intracatheter Q12H   Continuous Infusions: . sodium chloride    . dexmedetomidine (PRECEDEX) IV infusion    . feeding supplement (OSMOLITE 1.5 CAL) 1,000 mL (04/23/20 1200)  . heparin 900 Units/hr (04/24/20 1000)  . norepinephrine (LEVOPHED) Adult infusion Stopped (04/24/20 0815)   PRN Meds: acetaminophen, fentaNYL (SUBLIMAZE) injection, ipratropium-albuterol, ondansetron (ZOFRAN) IV, oxyCODONE, sodium chloride flush   Vital Signs    Vitals:   04/24/20 0700 04/24/20 0800 04/24/20 0949 04/24/20 1000  BP:  105/68  109/78  Pulse:  (!) 106  (!) 109  Resp:  (!) 25  (!) 25  Temp:  98.24 F (36.8 C)  98.24 F (36.8 C)  TempSrc: Bladder Bladder  Bladder  SpO2:  100% 100% 99%  Weight:      Height:        Intake/Output Summary (Last 24 hours) at 04/24/2020 1130 Last data filed at 04/24/2020 1000 Gross per 24 hour  Intake 2169.02 ml  Output 835 ml  Net 1334.02 ml   Last 3 Weights 04/24/2020 04/23/2020 04/22/2020  Weight (lbs) 190 lb 11.2 oz 188 lb 4.4 oz 186 lb 4.6 oz  Weight (kg) 86.5 kg 85.4 kg 84.5 kg      Telemetry    Atrial flutter with HR 100-110s -  Personally Reviewed  ECG    2:1 atrial flutter - Personally Reviewed  Physical Exam   GEN: trach present. Eye open   Neck: trach present Cardiac: tachycardic, no murmurs, rubs, or gallops.  Respiratory: anterior exam, mild crackles GI: Soft, , non-distended  MS: R BKA, L leg mild edema Neuro:  unable to assess Psych: unable to assess   Labs    High Sensitivity Troponin:   Recent Labs  Lab 04/20/20 0222  TROPONINIHS 59*      Chemistry Recent Labs  Lab 04/22/20 0341 04/23/20 0351 04/24/20 0401 04/24/20 0904  NA 135 136 133*  --   K 4.4 4.9 5.8* 5.6*  CL 100 101 101  --   CO2 _0 --   GLUCOSE 251* 203* 248*  --   BUN 68* 78* 86*  --   CREATININE 4.11* 4.17* 4.21*  --   CALCIUM 7.9* 8.1* 8.2*  --   ALBUMIN 1.7* 2.0* 1.9*  --   GFRNONAA 11* 10* 10*  --   ANIONGAP _1 --      Hematology Recent Labs  Lab 04/22/20 0341 04/22/20 1435 04/23/20 0351 04/24/20 0401  WBC 8.5  --  8.7 9.9  RBC 2.21*  --  2.56* 2.48*  HGB 6.4* 7.7* 7.4* 7.2*  HCT 21.1* 24.4* 24.6* 23.6*  MCV 95.5  --  96.1 95.2  MCH 29.0  --  28.9 29.0  MCHC 30.3  --  30.1 30.5  RDW 17.3*  --  17.5* 17.2*  PLT 195  --  185 172    BNP Recent Labs  Lab 04/20/20 0431  BNP 1,070.8*     DDimer No results for input(s): DDIMER in the last 168 hours.   Radiology    No results found.  Cardiac Studies   Echo 04/16/2020 1. Left ventricular ejection fraction, by estimation, is 55 to 60%. The  left ventricle has normal function. The left ventricle has no regional  wall motion abnormalities. Left ventricular diastolic function could not  be evaluated.  2. Right ventricular systolic function is normal. The right ventricular  size is normal. There is moderately elevated pulmonary artery systolic  pressure.  3. Left atrial size was mildly dilated.  4. The mitral valve is grossly normal. Mild to moderate mitral valve  regurgitation.  5. Tricuspid valve regurgitation is moderate.   6. The aortic valve is calcified. There is mild calcification of the  aortic valve. There is mild thickening of the aortic valve. Aortic valve  regurgitation is mild.  7. Aortic dilatation noted. There is mild dilatation of the ascending  aorta, measuring 39 mm.   Patient Profile     80 y.o. female with PMH of DM, HFpEF, CVA , R BKA, peg tube, and chronic respiratory failure s/p trach who was treated for HCAP however developed afib with RVR and hypotension and send to ED.  Assessment & Plan    1. Atrial flutter with RVR             - patient has h/o PAF dating back to 2010 not on anticoagulation for unclear reason             - CHA2DS2-Vasc score 7 (age, CVA, HTN, DM and female)             - Echo 04/16/2020 EF 55-60%, no RWMA, moderate PA pressure, mild to moderate MR, mild AI and aortic dilatation at 39 mm             - HR uncontrolled while in atrial flutter, cardiology consulted. Diltiazem oral suspension stopped on 3/21 for unknown reason (hypotension?) HR currently controlled on PO amiodarone. HR 235-573U,KGURKY EKG to ascertain she is still in atrial flutter, HR slightly better than yesterday, would continue the current dose for now. Current HR is not ideal, but reasonable given the current condition, suspect it will continue to improve with amiodarone load. Transition PO amiodarone to 251m BID after 1 week of loading on the higher dose for 1 month, then 2040mdaily thereafter.    2. AKI: arrived with Cr 09, trended up to 4.2. Suspect renal dysfunction driven by tachycardia and septic shock.   3. Hypotension: on oral midodrine. Diltiazem oral suspension stopped yesterday. IV levophed weaned off this morning.   4. Severe septic shock with PNA: Received oral midodrine this morning.   5. Hyperkalemia: Potassium increased from 4.9 to 5.8 this morning, repeat BMET      For questions or updates, please contact [facility] consult [email or web address] for contact info under         Signed, HaAlmyra DeforestPAClifton Forge3/22/2022, 11:30 AM    Patient seen and examined with HaAlmyra DeforestPA.  Agree as  above, with the following exceptions and changes as noted below.  Patient opens eyes today but does not seem to follow any commands.  Care described in detail at the bedside, no family present.  Gen: Not responsive, CV: Tachycardic and regular, soft systolic murmur, Lungs: Coarse bilaterally, Abd: soft, Extrem: Right BKA, left extremity cold neuro/Psych: Unable to assess.  All available labs, radiology testing, previous records reviewed.  Complex situation, agree with palliative care for goals of care.  Atrial flutter remains suboptimally rate controlled, we have transitioned from diltiazem to amiodarone for the benefit of her blood pressure.  Midodrine is continued, fortunately norepinephrine has been able to be stopped.  Would continue amiodarone load for atrial flutter for a total of 5 g, then would transition to amiodarone 200 mg daily.  Discussed with PCCM yesterday, in agreement about the cardiac plan.  Elouise Munroe, MD 04/24/20 12:37 PM

## 2020-04-24 NOTE — Progress Notes (Addendum)
Patient in respiratory distress, Stats down to 50's. Patient inline catheter clogged by blood clot. RT called to bring new inline catheter. Writer removed ventilation to provide catheter suction, replacing ventilator and giving O2 boost. Stats staying in 60's. Inner cannula removed and changed old cannula showed blood clot. O2 back up to 80's. Rt arrived to bring new inline cathter. Patient suctioned by Rt and current saturation now back to 100%. Will continue to monitor.

## 2020-04-24 NOTE — Progress Notes (Signed)
Inpatient Diabetes Program Recommendations  AACE/ADA: New Consensus Statement on Inpatient Glycemic Control   Target Ranges:  Prepandial:   less than 140 mg/dL      Peak postprandial:   less than 180 mg/dL (1-2 hours)      Critically ill patients:  140 - 180 mg/dL  Results for ENOLA, HALT (MRN [id number]) as of 04/24/2020 10:17  Ref. Range 04/24/2020 03:43 04/24/2020 07:18  Glucose-Capillary Latest Ref Range: 70 - 99 mg/dL 211 (H) 211 (H)   Results for FAUN, BAUSER (MRN [id number]) as of 04/24/2020 10:17  Ref. Range 04/23/2020 07:29 04/23/2020 11:08 04/23/2020 16:00 04/23/2020 19:36 04/23/2020 23:45  Glucose-Capillary Latest Ref Range: 70 - 99 mg/dL 177 (H) 198 (H) 221 (H) 216 (H) 197 (H)   Review of Glycemic Control  Current orders for Inpatient glycemic control: Levemir 25 units BID, Novolog 6 units Q4H, Novolog 0-15 units Q4H; Osmolite @ 55 ml/hr  Inpatient Diabetes Program Recommendations:    Insulin: Noted Lantus increased from 20 to 25 units BID. Please consider increasing tube feeding coverage to Novolog 8 units Q4H.  Thanks, Barnie Alderman, RN, MSN, CDE Diabetes Coordinator Inpatient Diabetes Program [phone] (Team Pager from 8am to 5pm)

## 2020-04-24 NOTE — Procedures (Signed)
Tracheostomy Change Note  Patient Details:   Name: Rebecca Harrell DOB: 1940-04-04 MRN: [id number]    Airway Documentation:     Evaluation  O2 sats: stable throughout Complications: No apparent complications Patient did tolerate procedure well. Bilateral Breath Sounds: Diminished    RT changed pt's trach to #8 shiley flex without complication. RN at bedside for assistance and CCM outside the door as needed. Positive color change was noted with change. RT monitor.   Ronaldo Miyamoto 04/24/2020, 8:21 AM

## 2020-04-24 NOTE — Progress Notes (Signed)
RT NOTES: Pt back on vent on full support at this time. PRVC 400/25/60%/+8. Will continue to monitor.

## 2020-04-24 NOTE — Progress Notes (Signed)
Pt was placed on trach collar 40%/10L per CCM. Pt is tolerating well at this time with stable vitals. RT will monitor.

## 2020-04-24 NOTE — Progress Notes (Signed)
END OF SHIFT NOTE:  Patient resting in bed with eyes closed at this time.   Neuro: Patient opens eyes to voice, Rass -2 majority of shift. Patient non-interactive, does withdraw to pain.  Resp: patient placed back on ventilation during shift via trach, vent settings FiO2 60%, peep 8, RR 25, TV 400. Patient does cough when needing tracheal suction. Moderate thick bloody secretions. Oral secretions minimal, thick and tan. Monitor HGB due to tracheal bleeding. Current 7.2 will give PRBC if <7.  Cardiac. Irregular HR. Uncontrolled A-Fib throughout shift with BBB. Rate stayed around 105-110.   GI: patient continues with flexi seal 150 u/o during shift. Osmolite 1.5 infusing at 55 ml/hr. New bag hung.   GU: Foley remains in place, total output for night shift 215. Continue to monitor kidney function, creatinine up to 4.21.   Skin: sacral dressing replaced with bath during shift, CHG given. Skin tare to right back with foam dressing in place.   K+ up to 5.8 provider made aware, see previous note.   IV lines changed, suction equipment changed, tube feed changed. Safety measures remain in place. Will continue to monitor and SBARR to day shift nurse.

## 2020-04-24 NOTE — Progress Notes (Signed)
[facility] NEPHROLOGY PROGRESS NOTE  Assessment/ Plan: Pt is a 80 y.o. yo female  with PMH of DM, HFpEF, CVA with right-sided hemiparesis/contracture, s/p right BKA, PEG tube, chronic respiratory failure status post tracheostomy, she was recently at [facility] where treated for HCAP developed A. fib with RVR, hypotension and was sent to ER.  Acute kidney injury, oliguric: Suspect hemodynamically mediated AKI in the setting of A. fib with RVR, IV contrast, recent infection.  UA without proteinuria or microscopic hematuria.  Kidney ultrasound ruled out obstruction.   Not much urine output however creatinine remains stable today (seems that she is plateauing).The potassium level and volume status acceptable. I would not offer hemodialysis with her limited functional status at baseline and the likelihood that this would further compromise her overall quality of life. Palliative care team is following.  Continue supportive management.  There is no urgent need for dialysis today. Monitor urine output, daily lab.  Hyperkalemia: Status post medical management.  Lokelma, lasix today  Hypotension -pressors per primary service, on midodrine  Acute on chronic hypoxic respiratory failure: Currently on vent in ICU.  PCCM is following.  Severe sepsis/Healthcare associated pneumonia/septic shock: Treated with broad-spectrum antibiotics per primary team.  She was not on Levophed this morning.  Blood pressure running low.  Per PCCM team.   A. fib with RVR: On Eliquis, diltiazem.  Combined metabolic and respiratory acidosis: Improving with mechanical ventilation.  Nutrition: She has hypoalbuminemia.  Push protein  Discussed with primary service.  Subjective: uop 0.4L, k 5.8. no acute events. rec'd lokelma Objective Vital signs in last 24 hours: Vitals:   04/24/20 0700 04/24/20 0800 04/24/20 0949 04/24/20 1000  BP:  105/68  109/78  Pulse:  (!) 106  (!) 109  Resp:  (!) 25  (!) 25   Temp:  98.24 F (36.8 C)  98.24 F (36.8 C)  TempSrc: Bladder Bladder  Bladder  SpO2:  100% 100% 99%  Weight:      Height:       Weight change: 1.1 kg  Intake/Output Summary (Last 24 hours) at 04/24/2020 1144 Last data filed at 04/24/2020 1000 Gross per 24 hour  Intake 2169.02 ml  Output 835 ml  Net 1334.02 ml       Labs: Basic Metabolic Panel: Recent Labs  Lab 04/22/20 0341 04/23/20 0351 04/24/20 0401 04/24/20 0904  NA 135 136 133*  --   K 4.4 4.9 5.8* 5.6*  CL 100 101 101  --   CO2 _0 --   GLUCOSE 251* 203* 248*  --   BUN 68* 78* 86*  --   CREATININE 4.11* 4.17* 4.21*  --   CALCIUM 7.9* 8.1* 8.2*  --   PHOS 5.4* 6.0* 6.6*  --    Liver Function Tests: Recent Labs  Lab 04/22/20 0341 04/23/20 0351 04/24/20 0401  ALBUMIN 1.7* 2.0* 1.9*   No results for input(s): LIPASE, AMYLASE in the last 168 hours. No results for input(s): AMMONIA in the last 168 hours. CBC: Recent Labs  Lab 04/20/20 0222 04/20/20 0607 04/21/20 0316 04/22/20 0341 04/22/20 1435 04/23/20 0351 04/24/20 0401  WBC 14.6*  --  14.2* 8.5  --  8.7 9.9  HGB 7.3*   < > 7.1* 6.4* 7.7* 7.4* 7.2*  HCT 26.0*   < > 23.1* 21.1* 24.4* 24.6* 23.6*  MCV 100.4*  --  94.3 95.5  --  96.1 95.2  PLT 300  --  234 195  --  185 172   < > =  values in this interval not displayed.   Cardiac Enzymes: Recent Labs  Lab 04/18/20 1500  CKTOTAL 28*   CBG: Recent Labs  Lab 04/23/20 1600 04/23/20 1936 04/23/20 2345 04/24/20 0343 04/24/20 0718  GLUCAP 221* 216* 197* 211* 211*    Iron Studies:  No results for input(s): IRON, TIBC, TRANSFERRIN, FERRITIN in the last 72 hours. Studies/Results: No results found.  Medications: Infusions: . sodium chloride    . dexmedetomidine (PRECEDEX) IV infusion    . feeding supplement (OSMOLITE 1.5 CAL) 1,000 mL (04/23/20 1200)  . heparin 900 Units/hr (04/24/20 1000)  . norepinephrine (LEVOPHED) Adult infusion Stopped (04/24/20 0815)    Scheduled  Medications: . amiodarone  400 mg Per Tube BID  . chlorhexidine gluconate (MEDLINE KIT)  15 mL Mouth Rinse BID  . Chlorhexidine Gluconate Cloth  6 each Topical Daily  . feeding supplement (PROSource TF)  45 mL Per Tube Daily  . insulin aspart  0-15 Units Subcutaneous Q4H  . insulin aspart  6 Units Subcutaneous Q4H  . insulin detemir  25 Units Subcutaneous BID  . levETIRAcetam  500 mg Per Tube QHS  . mouth rinse  15 mL Mouth Rinse 10 times per day  . midodrine  5 mg Per Tube Q8H  . pantoprazole sodium  40 mg Per Tube Q24H  . QUEtiapine  25 mg Per Tube QHS  . sodium chloride flush  10-40 mL Intracatheter Q12H    have reviewed scheduled and prn medications.  Physical Exam: General: Critically ill looking female, not much responsive except painful stimuli, on mechanical ventilation,  Neck: tracheostomy presents Heart:RRR, s1s2 nl, no rub Lungs: Coarse breath sound bilateral Abdomen:soft,  non-distended Extremities: Contracture of upper extremities, right AKA. With right stump edema and LLE edema Neurology:  not responding   Aamina Skiff 04/24/2020,11:44 AM  LOS: 9 days

## 2020-04-24 NOTE — Progress Notes (Signed)
[facility] for IV Heparin Indication: atrial fibrillation  Allergies  Allergen Reactions  . Quinolones     UNK reaction    Patient Measurements: Height: '5\' 7"'$  (170.2 cm) Weight: 86.5 kg (190 lb 11.2 oz) IBW/kg (Calculated) : 61.6 Heparin Dosing Weight: 78 kg  Vital Signs: Temp: 98.8 F (37.1 C) (03/22 0645) Temp Source: Bladder (03/22 0600) BP: 122/70 (03/22 0645) Pulse Rate: 106 (03/22 0645)  Labs: Recent Labs    04/22/20 0341 04/22/20 1435 04/22/20 2203 04/23/20 0351 04/24/20 0401  HGB 6.4* 7.7*  --  7.4* 7.2*  HCT 21.1* 24.4*  --  24.6* 23.6*  PLT 195  --   --  185 172  APTT 115* 76* 89* 78* 80*  HEPARINUNFRC 1.94*  --   --  0.92* 0.54  CREATININE 4.11*  --   --  4.17* 4.21*    Estimated Creatinine Clearance: 12.6 mL/min (A) (by C-G formula based on SCr of 4.21 mg/dL (H)).  Assessment: 80 years of age female on Eliquis prior to admission for atrial fibrillation who is now in acute renal failure and minimal to no urine output and Eliquis was held after discussion with Dr. Gasper Sells on 3/18 - at that time no plans to start Heparin per that verbal discussion. Last dose of Eliquis was on 3/18 at 09:33 AM. On 3/19, pharmacy consulted by Cardiology to dose IV Heparin.  Heparin level and aPTT are both therapeutic; appears to be correlating.  No bleeding reported.  Goal of Therapy:  Heparin level 0.3-0.7 units/ml  APTT 66-102 sec  Monitor platelets by anticoagulation protocol: Yes   Plan:  Continue heparin gtt at 900 units/hr Daily heparin level and CBC D/C daily aPTT  Graziella Connery D. Mina Marble, PharmD, BCPS, [place] 04/24/2020, 7:13 AM

## 2020-04-24 NOTE — Progress Notes (Signed)
Contacted [facility] in regards to patients K+ up to 5.8. Creatinine 4.21. awaiting orders.

## 2020-04-24 NOTE — Progress Notes (Signed)
NAME:  Rebecca Harrell, MRN:  [id number], DOB:  Mar 04, 1940, LOS: 9 ADMISSION DATE:  04/15/2020, CONSULTATION DATE:  04/16/2020 REFERRING MD:  Dr. Myna Hidalgo, [facility] CHIEF COMPLAINT:  04/16/2020  Brief History:  16 yoF with chronic respiratory failure s/p trach sent from [facility] for Afib with RVR.  Patient is also being treated for pneumonia on zosyn pta.  CXR concerning for possible partially loculated effusion.  Pulmonary consulted for further evaluation.   Past Medical History:  HTN, DM, Chronic diastolic CHF, CVA with Rt side weakness, Seizure, Aspiration pneumonia, s/p trach and PEG, PAF  Significant Hospital Events:  3/13 Admitted [place] 3/15 on trach collar 5L 28% 3/17 Worsening hypoxemia and increased secretions 3/18 PCCM called to see patient overnight secondary to increasing work of breathing and hypoxia secondary to possible aspiration event 3/19 air leak from trach >> improved after changing to pressure support; change to heparin gtt 3/20 transfuse 1 unit PRBC; off pressors 3/21 back on pressors; start midodrine and reduce cardizem dose. ATC tolerated for 7hrs 3/22 Trach changed to 8 Shiley cuffed due to leak   Consults:  Cardiology Palliative care Nephrology  Procedures:  Lurline Idol   Significant Diagnostic Tests:   3/13 CTA PE >> pulmonary edema, small b/p effusions, small loculated component in minor fissure and oblique fissure  3/13 CT abd/ pelvis >> No acute findings within the abdomen.  3/16 renal u/s >> cholelithiasis, normal kidneys  Micro Data:  3/13 BCx>>negative 3/13 SARS 2/ flu >> negative 3/13 UCx >> 100k Vanc resistant enterococcus 3/14 MRSA >> negative 3/17 sputum >> Pseudomonas aeruginosa (resistant to fortaz and imipenem)  Antimicrobials:  Vancomycin 3/13 >> 3/14 Zosyn 3/13 Zithromax 3/13 >> 3/17 Linezolid 3/15 >> 3/17 Cefepime 3/13 >> 3/19  Interim History / Subjective:  No acute issues overnight  Remains +10L with decreased urine output   Objective    Blood pressure 122/70, pulse (!) 106, temperature 98.8 F (37.1 C), resp. rate 20, height '5\' 7"'$  (1.702 m), weight 86.5 kg, SpO2 100 %.    Vent Mode: PRVC FiO2 (%):  [40 %-60 %] 60 % Set Rate:  [25 bmp] 25 bmp Vt Set:  [400 mL] 400 mL PEEP:  [5 cmH20-8 cmH20] 8 cmH20 Pressure Support:  [10 cmH20] 10 cmH20 Plateau Pressure:  [29 cmH20] 29 cmH20   Intake/Output Summary (Last 24 hours) at 04/24/2020 [id number] Last data filed at 04/24/2020 0600 Gross per 24 hour  Intake 2032.38 ml  Output 585 ml  Net 1447.38 ml   Filed Weights   04/22/20 0130 04/23/20 0417 04/24/20 0500  Weight: 84.5 kg 85.4 kg 86.5 kg    Physical Exam General: Chronically ill appearing elderly deconditioned elderly female on mechanical ventilation via trach in NAD HEENT: 8 cuffed Shiley midline, MM pink/moist, PERRL,  Neuro: Eyes spontaneously open, right arm and hand severely contracted  CV: s1s2 regular rate and rhythm, no murmur, rubs, or gallops,  PULM: Clear breath sounds bilaterally, no increased work of breathing, tolerating vent  GI: soft, bowel sounds active in all 4 quadrants, non-tender, non-distended, tolerating TF Extremities: warm/dry, no edema, Rt AKA Skin: no rashes or lesions  Resolved Hospital Problem list   VRE UTI, Aspiration pneumonitis with drug resistant Pseudomonas aeruginosa  Assessment & Plan:   Acute on chronic hypoxic respiratory failure from aspiration pneumonitis. Tracheostomy status. P: Continue ventilator support with lung protective strategies  Cuff leak worsened trach exchanged 3/22 Goal for ATC Wean PEEP and FiO2 for sats greater than 90%. Head of bed elevated 30  degrees. Plateau pressures less than 30 cm H20.  Follow intermittent chest x-ray and ABG.   Ensure adequate pulmonary hygiene  VAP bundle in place, stop continuous fentanyl PAD protocol Continue BDs  Hypotension from hypovolemia and sedation. P: Stop continuous fentanyl  Continue Midodrine  Wean pressors    Loculated pleural effusions in minor fissure and oblique fissure. - nothing to be done for these at present P: Supportive care Follow CXR as needed   Atrial fibrillation. P: Cardiology consulted and following  Eliquis remains on hold in favor of IV Heparin drip  Cardizem discontinued in favor of Amiodarone  Continuous telemetry  Hx of CVA with Rt hemiparesis. Seizure disorder. Vegetative state. P: Supportive care  Manage pain  Continue Keppra (reduced dose due to renal function) and Seroquel PRN Oxy   AKI from ATN 2nd to hypoxia and hypotension. - baseline creatinine 0.96 from 04/15/20 - no indication for acute dialysis at this time; she is not an appropriate candidate for long term HD Hyperkalemia  - Potassium 5.8 Positive fluid balance  P: Nephrology following, appreciate assistance  Lasix challenge today  Give dose of Lokelma now  Follow renal function / urine output Trend Bmet Avoid nephrotoxins Ensure adequate renal perfusion  Enteral  hydration  Anemia of critical illness and chronic disease. P: Trend CBC  Transfuse per protocol  Hgb goal > 7  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. P: Continue SSI and Levimir Levimir increased  Tube feed coverage  CBG checks q4hrs  Goals of care. - appreciate assistance from palliative care team  Pressure injury. - stage 2 coccyx, present on admission P: Wound care Frequent turn  Pressure alleviating devices  Optimize nutrition   Best practice (evaluated daily)  Diet: tube feeds DVT prophylaxis: heparin gtt GI prophylaxis: protonix Mobility: bed rest Disposition: ICU Code Status: full code  Labs    CMP Latest Ref Rng & Units 04/24/2020 04/23/2020 04/22/2020  Glucose 70 - 99 mg/dL 248(H) 203(H) 251(H)  BUN 8 - 23 mg/dL 86(H) 78(H) 68(H)  Creatinine 0.44 - 1.00 mg/dL 4.21(H) 4.17(H) 4.11(H)  Sodium 135 - 145 mmol/L 133(L) 136 135  Potassium 3.5 - 5.1 mmol/L 5.8(H) 4.9 4.4  Chloride 98 - 111  mmol/L 101 101 100  CO2 22 - 32 mmol/L '24 24 23  '$ Calcium 8.9 - 10.3 mg/dL 8.2(L) 8.1(L) 7.9(L)  Total Protein 6.5 - 8.1 g/dL - - -  Total Bilirubin 0.3 - 1.2 mg/dL - - -  Alkaline Phos 38 - 126 U/L - - -  AST 15 - 41 U/L - - -  ALT 0 - 44 U/L - - -    CBC Latest Ref Rng & Units 04/24/2020 04/23/2020 04/22/2020  WBC 4.0 - 10.5 K/uL 9.9 8.7 -  Hemoglobin 12.0 - 15.0 g/dL 7.2(L) 7.4(L) 7.7(L)  Hematocrit 36.0 - 46.0 % 23.6(L) 24.6(L) 24.4(L)  Platelets 150 - 400 K/uL 172 185 -    ABG    Component Value Date/Time   PHART 7.297 (L) 04/20/2020 1022   PCO2ART 51.8 (H) 04/20/2020 1022   PO2ART 335 (H) 04/20/2020 1022   HCO3 25.3 04/20/2020 1022   TCO2 27 04/20/2020 1022   ACIDBASEDEF 1.0 04/20/2020 1022   O2SAT 100.0 04/20/2020 1022    CBG (last 3)  Recent Labs    04/23/20 2345 04/24/20 0343 04/24/20 0718  GLUCAP 197* 211* 211*    Critical care time:    Performed by: Johnsie Cancel  Total critical care time: 38 minutes  Critical care time was exclusive of separately billable procedures and treating other patients.  Critical care was necessary to treat or prevent imminent or life-threatening deterioration.  Critical care was time spent personally by me on the following activities: development of treatment plan with patient and/or surrogate as well as nursing, discussions with consultants, evaluation of patient's response to treatment, examination of patient, obtaining history from patient or surrogate, ordering and performing treatments and interventions, ordering and review of laboratory studies, ordering and review of radiographic studies, pulse oximetry and re-evaluation of patient's condition.  Johnsie Cancel, NP-C [facility] Personal contact information can be found on Amion  If no response please page: Adult pulmonary and critical care medicine pager on Amion unitl 7pm After 7pm please call [phone] 04/24/2020, 7:37 AM

## 2020-04-25 DIAGNOSIS — I4891 Unspecified atrial fibrillation: Secondary | ICD-10-CM | POA: Diagnosis not present

## 2020-04-25 DIAGNOSIS — N179 Acute kidney failure, unspecified: Secondary | ICD-10-CM | POA: Diagnosis not present

## 2020-04-25 DIAGNOSIS — G40909 Epilepsy, unspecified, not intractable, without status epilepticus: Secondary | ICD-10-CM | POA: Diagnosis not present

## 2020-04-25 DIAGNOSIS — J9621 Acute and chronic respiratory failure with hypoxia: Secondary | ICD-10-CM | POA: Diagnosis not present

## 2020-04-25 LAB — BASIC METABOLIC PANEL
Anion gap: 10 (ref 5–15)
BUN: 96 mg/dL — ABNORMAL HIGH (ref 8–23)
CO2: 23 mmol/L (ref 22–32)
Calcium: 8.5 mg/dL — ABNORMAL LOW (ref 8.9–10.3)
Chloride: 103 mmol/L (ref 98–111)
Creatinine, Ser: 4.46 mg/dL — ABNORMAL HIGH (ref 0.44–1.00)
GFR, Estimated: 10 mL/min — ABNORMAL LOW (ref >60)
Glucose, Bld: 157 mg/dL — ABNORMAL HIGH (ref 70–99)
Potassium: 5 mmol/L (ref 3.5–5.1)
Sodium: 136 mmol/L (ref 135–145)

## 2020-04-25 LAB — CBC
HCT: 25.3 % — ABNORMAL LOW (ref 36.0–46.0)
Hemoglobin: 7.6 g/dL — ABNORMAL LOW (ref 12.0–15.0)
MCH: 28.5 pg (ref 26.0–34.0)
MCHC: 30 g/dL (ref 30.0–36.0)
MCV: 94.8 fL (ref 80.0–100.0)
Platelets: 157 10*3/uL (ref 150–400)
RBC: 2.67 MIL/uL — ABNORMAL LOW (ref 3.87–5.11)
RDW: 17.2 % — ABNORMAL HIGH (ref 11.5–15.5)
WBC: 8.2 10*3/uL (ref 4.0–10.5)
nRBC: 0 % (ref 0.0–0.2)

## 2020-04-25 LAB — GLUCOSE, CAPILLARY
Glucose-Capillary: 112 mg/dL — ABNORMAL HIGH (ref 70–99)
Glucose-Capillary: 124 mg/dL — ABNORMAL HIGH (ref 70–99)
Glucose-Capillary: 128 mg/dL — ABNORMAL HIGH (ref 70–99)
Glucose-Capillary: 137 mg/dL — ABNORMAL HIGH (ref 70–99)
Glucose-Capillary: 141 mg/dL — ABNORMAL HIGH (ref 70–99)
Glucose-Capillary: 142 mg/dL — ABNORMAL HIGH (ref 70–99)
Glucose-Capillary: 151 mg/dL — ABNORMAL HIGH (ref 70–99)

## 2020-04-25 LAB — HEPARIN LEVEL (UNFRACTIONATED): Heparin Unfractionated: 0.31 IU/mL (ref 0.30–0.70)

## 2020-04-25 MED ORDER — MIDODRINE HCL 5 MG PO TABS
10.0000 mg | ORAL_TABLET | Freq: Three times a day (TID) | ORAL | Status: DC
  Administered 2020-04-25 – 2020-04-29 (×12): 10 mg
  Filled 2020-04-25 (×12): qty 2

## 2020-04-25 MED ORDER — NUTRISOURCE FIBER PO PACK
1.0000 | PACK | Freq: Two times a day (BID) | ORAL | Status: DC
  Administered 2020-04-25 – 2020-05-04 (×19): 1
  Filled 2020-04-25 (×19): qty 1

## 2020-04-25 MED ORDER — MIDODRINE HCL 5 MG PO TABS
5.0000 mg | ORAL_TABLET | ORAL | Status: AC
  Administered 2020-04-25: 5 mg
  Filled 2020-04-25: qty 1

## 2020-04-25 NOTE — Progress Notes (Signed)
Patient ID: Rebecca Harrell, female   DOB: 18-Feb-1940, 80 y.o.   MRN: [id number]  PROGRESS NOTE    Rebecca Harrell  WVP:[id number] DOB: 06-19-1940 DOA: 04/15/2020 PCP: Aaron Edelman, MD   Brief Narrative:  80 year old female with history of chronic hypoxic respiratory failure with tracheostomy and PEG tube dependence, aspiration, hypertension, diabetes mellitus type 2, chronic diastolic CHF, seizure disorder, unspecified CVA with right-sided hemiparesis, chronic vegetative state, paroxysmal A. fib recently taken off anticoagulation, pneumonia receiving antibiotic treatment at [facility] prior to presentation presents with A. fib with RVR.  Chest x-ray was concerning for possibility partially loculated effusion.  CT was negative for PE pulmonary and cardiology were consulted.  Patient had worsening hypoxia and care was taken over by [facility] team.  She was treated with broad-spectrum antibiotics.  Sputum culture grew Pseudomonas aeruginosa.  Patient was treated with heparin drip.  She required 1 unit packed red cell transfusion on 04/22/2020.  She has had issues with hypotension requiring intermittent pressors and subsequently starting midodrine.  Lurline Idol was changed on 04/24/2020 due to leak.  Palliative care also has been consulted.  She was transferred back to [facility] service on 04/25/2020.  Assessment & Plan:   Acute on chronic hypoxic respiratory failure, history of tracheostomy Aspiration pneumonitis -PCCM following and managing tracheostomy and ventilator.  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  Patient was treated with broad-spectrum antibiotics.  Antibiotics subsequently discontinued 04/20/2020 by PCCM.  Defer further antibiotics unless she develops new infiltrates on chest x-ray or spikes fever -Pulmonary/bronchial hygiene  Chronic hypotension -Blood pressure still on the lower side.  Will increase midodrine to 10 mg 3 times a  day.  Pressors have been weaned off.  Continues fentanyl has been discontinued.  Paroxysmal A. fib with RVR -Not on anticoagulation as an outpatient -Still intermittently tachycardic.  Cardiology following.  Currently on heparin drip and amiodarone via tube  AKI -Possibly from ATN from hypotension and hypoxia -Nephrology following: Follow recommendations.  Creatinine worsening, 4.46 today -Renal ultrasound rule out obstruction. 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  Continue PEG feeding.  Continue Keppra/Seroquel. -Overall prognosis is very poor.  Palliative care consultation for goals of care discussion  Diabetes mellitus type II with hyperglycemia -Continue Levemir along with CBGs with SSI  Anemia of chronic disease -Stable.  Transfuse if hemoglobin is less than 7.  Has required 1 unit packed red cell transfusion on 04/22/2020  Generalized conditioning -Overall prognosis is very poor.  PCCM has reconsulted palliative care for goals of care discussion.  Will await recommendations.  Stage II coccygeal pressure injury present on admission -Continue local wound care.   DVT prophylaxis: Heparin drip Code Status: Full Family Communication: None at bedside Disposition Plan: Status is: Inpatient  Remains inpatient appropriate because:Inpatient level of care appropriate due to severity of illness   Dispo: The patient is from: LTAC              Anticipated d/c is to: LTAC              Patient currently is not medically stable to d/c.   Difficult to place patient No  Consultants: PCCM/nephrology/palliative care  Procedures: None  Antimicrobials:  Anti-infectives (From admission, onward)   Start     Dose/Rate Route Frequency Ordered Stop   04/19/20 1115  ceFEPIme (MAXIPIME) 2 g in sodium chloride  0.9 % 100 mL IVPB        2 g 200 mL/hr over 30 Minutes Intravenous Every 24 hours 04/18/20 1503 04/20/20 1051   04/19/20 1102  ceFEPIme (MAXIPIME) 2 g in sodium chloride 0.9 % 100 mL IVPB  Status:  Discontinued        2 g 200 mL/hr over 30 Minutes Intravenous Every 24 hours 04/18/20 1501 04/18/20 1503   04/17/20 1100  linezolid (ZYVOX) IVPB 600 mg        600 mg 300 mL/hr over 60 Minutes Intravenous Every 12 hours 04/17/20 1002 04/20/20 0032   04/16/20 1400  vancomycin (VANCOREADY) IVPB 1250 mg/250 mL  Status:  Discontinued        1,250 mg 166.7 mL/hr over 90 Minutes Intravenous Every 24 hours 04/15/20 1436 04/17/20 0813   04/16/20 0600  ceFEPIme (MAXIPIME) 2 g in sodium chloride 0.9 % 100 mL IVPB  Status:  Discontinued        2 g 200 mL/hr over 30 Minutes Intravenous Every 12 hours 04/15/20 2350 04/18/20 1501   04/16/20 0000  azithromycin (ZITHROMAX) 500 mg in sodium chloride 0.9 % 250 mL IVPB        500 mg 250 mL/hr over 60 Minutes Intravenous Every 24 hours 04/15/20 2350 04/20/20 0100   04/15/20 1700  piperacillin-tazobactam (ZOSYN) IVPB 3.375 g        3.375 g 12.5 mL/hr over 240 Minutes Intravenous  Once 04/15/20 1646 04/15/20 2101   04/15/20 1315  vancomycin (VANCOREADY) IVPB 1500 mg/300 mL        1,500 mg 150 mL/hr over 120 Minutes Intravenous  Once 04/15/20 1305 04/15/20 1617       Subjective: Patient seen and examined at bedside.  No overnight fever or vomiting reported.  Objective: Vitals:   04/25/20 0930 04/25/20 1000 04/25/20 1030 04/25/20 1100  BP: (!) 99/50 103/60 (!) 95/44 100/74  Pulse: (!) 105 (!) 104 (!) 105 (!) 108  Resp: (!) 25 (!) 22 (!) 25 (!) 25  Temp:      TempSrc:      SpO2: 100% 100% 93% 100%  Weight:      Height:        Intake/Output Summary (Last 24 hours) at 04/25/2020 1124 Last data filed at 04/25/2020 1106 Gross per 24 hour  Intake 1872.37 ml  Output 506 ml  Net 1366.37 ml   Filed Weights   04/23/20 0417 04/24/20 0500 04/25/20 0500   Weight: 85.4 kg 86.5 kg 86.5 kg    Examination:  General exam: Chronically ill looking female lying in bed.  Does not follow commands. ENT: Currently on ventilator via trach Respiratory system: Bilateral decreased breath sounds at bases, tachypneic.  Scattered crackles heard Cardiovascular system: S1 & S2 heard, tachycardic Gastrointestinal system: Abdomen is distended, soft and nontender. Normal bowel sounds heard.  PEG tube present. Extremities: No cyanosis, clubbing, edema.  Right leg amputation present Central nervous system: Unresponsive/does not follow commands.  Right upper extremity contracture present.   Skin: No obvious ecchymosis/lesions  psychiatry: Could not be assessed because of mental status    Data Reviewed: I have personally reviewed following labs and imaging studies  CBC: Recent Labs  Lab 04/21/20 0316 04/22/20 0341 04/22/20 1435 04/23/20 0351 04/24/20 0401 04/25/20 0522  WBC 14.2* 8.5  --  8.7 9.9 8.2  HGB 7.1* 6.4* 7.7* 7.4* 7.2* 7.6*  HCT 23.1* 21.1* 24.4* 24.6* 23.6* 25.3*  MCV 94.3 95.5  --  96.1 95.2 94.8  PLT 234 195  --  185 172 671   Basic Metabolic Panel: Recent Labs  Lab 04/20/20 0319 04/20/20 0510 04/21/20 0316 04/22/20 0341 04/23/20 0351 04/24/20 0401 04/24/20 0904 04/24/20 1228 04/25/20 0853  NA 131*   < > 137 135 136 133*  --  134* 136  K 5.9*   < > 3.8 4.4 4.9 5.8* 5.6* 5.5* 5.0  CL 98  --  100 100 101 101  --  101 103  CO2 17*  --  '23 23 24 24  ' --  24 23  GLUCOSE 492*  --  144* 251* 203* 248*  --  236* 157*  BUN 56*  --  61* 68* 78* 86*  --  90* 96*  CREATININE 3.95*  --  4.25* 4.11* 4.17* 4.21*  --  4.27* 4.46*  CALCIUM 7.9*  --  8.0* 7.9* 8.1* 8.2*  --  8.2* 8.5*  PHOS 6.9*  --  5.1* 5.4* 6.0* 6.6*  --   --   --    < > = values in this interval not displayed.   GFR: Estimated Creatinine Clearance: 11.9 mL/min (A) (by C-G formula based on SCr of 4.46 mg/dL (H)). Liver Function Tests: Recent Labs  Lab 04/20/20 0319  04/21/20 0316 04/22/20 0341 04/23/20 0351 04/24/20 0401  ALBUMIN 2.3* 1.9* 1.7* 2.0* 1.9*   No results for input(s): LIPASE, AMYLASE in the last 168 hours. No results for input(s): AMMONIA in the last 168 hours. Coagulation Profile: No results for input(s): INR, PROTIME in the last 168 hours. Cardiac Enzymes: Recent Labs  Lab 04/18/20 1500  CKTOTAL 28*   BNP (last 3 results) No results for input(s): PROBNP in the last 8760 hours. HbA1C: No results for input(s): HGBA1C in the last 72 hours. CBG: Recent Labs  Lab 04/24/20 1548 04/24/20 1934 04/25/20 0004 04/25/20 0323 04/25/20 0712  GLUCAP 216* 197* 137* 151* 142*   Lipid Profile: No results for input(s): CHOL, HDL, LDLCALC, TRIG, CHOLHDL, LDLDIRECT in the last 72 hours. Thyroid Function Tests: No results for input(s): TSH, T4TOTAL, FREET4, T3FREE, THYROIDAB in the last 72 hours. Anemia Panel: No results for input(s): VITAMINB12, FOLATE, FERRITIN, TIBC, IRON, RETICCTPCT in the last 72 hours. Sepsis Labs: Recent Labs  Lab 04/20/20 0447 04/20/20 0934  LATICACIDVEN 6.4* 2.9*    Recent Results (from the past 240 hour(s))  Blood culture (routine single)     Status: None   Collection Time: 04/15/20 12:53 PM   Specimen: BLOOD RIGHT FOREARM  Result Value Ref Range Status   Specimen Description BLOOD RIGHT FOREARM  Final   Special Requests   Final    BOTTLES DRAWN AEROBIC AND ANAEROBIC Blood Culture adequate volume   Culture   Final    NO GROWTH 5 DAYS Performed at [facility], [address], [place], [place] [address]    Report Status 04/20/2020 FINAL  Final  Resp Panel by RT-PCR (Flu A&B, Covid) Nasopharyngeal Swab     Status: None   Collection Time: 04/15/20 12:55 PM   Specimen: Nasopharyngeal Swab; Nasopharyngeal(NP) swabs in vial transport medium  Result Value Ref Range Status   SARS Coronavirus 2 by RT PCR NEGATIVE NEGATIVE Final    Comment: (NOTE) SARS-CoV-2 target nucleic acids are NOT  DETECTED.  The SARS-CoV-2 RNA is generally detectable in upper respiratory specimens during the acute phase of infection. The lowest concentration of SARS-CoV-2 viral copies this assay can detect is 138 copies/mL. A negative result does not  preclude SARS-Cov-2 infection and should not be used as the sole basis for treatment or other patient management decisions. A negative result may occur with  improper specimen collection/handling, submission of specimen other than nasopharyngeal swab, presence of viral mutation(s) within the areas targeted by this assay, and inadequate number of viral copies(<138 copies/mL). A negative result must be combined with clinical observations, patient history, and epidemiological information. The expected result is Negative.  Fact Sheet for Patients:  EntrepreneurPulse.com.au  Fact Sheet for Healthcare Providers:  IncredibleEmployment.be  This test is no t yet approved or cleared by the [place] FDA and  has been authorized for detection and/or diagnosis of SARS-CoV-2 by FDA under an Emergency Use Authorization (EUA). This EUA will remain  in effect (meaning this test can be used) for the duration of the COVID-19 declaration under Section 564(b)(1) of the Act, 21 U.S.C.section 360bbb-3(b)(1), unless the authorization is terminated  or revoked sooner.       Influenza A by PCR NEGATIVE NEGATIVE Final   Influenza B by PCR NEGATIVE NEGATIVE Final    Comment: (NOTE) The Xpert Xpress SARS-CoV-2/FLU/RSV plus assay is intended as an aid in the diagnosis of influenza from Nasopharyngeal swab specimens and should not be used as a sole basis for treatment. Nasal washings and aspirates are unacceptable for Xpert Xpress SARS-CoV-2/FLU/RSV testing.  Fact Sheet for Patients: EntrepreneurPulse.com.au  Fact Sheet for Healthcare Providers: IncredibleEmployment.be  This test is not yet  approved or cleared by the [place] FDA and has been authorized for detection and/or diagnosis of SARS-CoV-2 by FDA under an Emergency Use Authorization (EUA). This EUA will remain in effect (meaning this test can be used) for the duration of the COVID-19 declaration under Section 564(b)(1) of the Act, 21 U.S.C. section 360bbb-3(b)(1), unless the authorization is terminated or revoked.  Performed at [facility], [place] [address], [place], [place] [address]   Urine culture     Status: Abnormal   Collection Time: 04/15/20  3:24 PM   Specimen: In/Out Cath Urine  Result Value Ref Range Status   Specimen Description IN/OUT CATH URINE  Final   Special Requests   Final    NONE Performed at [facility], [place] [address], [place], [place] [address]    Culture (A)  Final    >=100,000 COLONIES/mL VANCOMYCIN RESISTANT ENTEROCOCCUS ISOLATED   Report Status 04/17/2020 FINAL  Final   Organism ID, Bacteria VANCOMYCIN RESISTANT ENTEROCOCCUS ISOLATED (A)  Final      Susceptibility   Vancomycin resistant enterococcus isolated - MIC*    AMPICILLIN >=32 RESISTANT Resistant     NITROFURANTOIN 64 INTERMEDIATE Intermediate     VANCOMYCIN >=32 RESISTANT Resistant     LINEZOLID 2 SENSITIVE Sensitive     * >=100,000 COLONIES/mL VANCOMYCIN RESISTANT ENTEROCOCCUS ISOLATED  MRSA PCR Screening     Status: None   Collection Time: 04/16/20  4:02 AM   Specimen: Nasal Mucosa; Nasopharyngeal  Result Value Ref Range Status   MRSA by PCR NEGATIVE NEGATIVE Final    Comment:        The GeneXpert MRSA Assay (FDA approved for NASAL specimens only), is one component of a comprehensive MRSA colonization surveillance program. It is not intended to diagnose MRSA infection nor to guide or monitor treatment for MRSA infections. Performed at [facility], [place] [address], [place], [place] [address]   Culture, Respiratory w Gram Stain     Status: None   Collection Time: 04/19/20  5:38 PM  Specimen: Tracheal Aspirate; Respiratory  Result Value Ref Range Status   Specimen Description TRACHEAL ASPIRATE  Final   Special Requests NONE  Final   Gram Stain   Final    MODERATE WBC PRESENT,BOTH PMN AND MONONUCLEAR NO ORGANISMS SEEN Performed at [facility], [address] [place], [place] [address]    Culture RARE PSEUDOMONAS AERUGINOSA  Final   Report Status 04/23/2020 FINAL  Final   Organism ID, Bacteria PSEUDOMONAS AERUGINOSA  Final      Susceptibility   Pseudomonas aeruginosa - MIC*    CEFTAZIDIME >=64 RESISTANT Resistant     CIPROFLOXACIN <=0.25 SENSITIVE Sensitive     GENTAMICIN <=1 SENSITIVE Sensitive     IMIPENEM >=16 RESISTANT Resistant     * RARE PSEUDOMONAS AERUGINOSA         Radiology Studies: No results found.      Scheduled Meds: . amiodarone  400 mg Per Tube BID  . chlorhexidine gluconate (MEDLINE KIT)  15 mL Mouth Rinse BID  . Chlorhexidine Gluconate Cloth  6 each Topical Daily  . feeding supplement (PROSource TF)  45 mL Per Tube Daily  . fiber  1 packet Per Tube BID  . insulin aspart  0-15 Units Subcutaneous Q4H  . insulin aspart  6 Units Subcutaneous Q4H  . insulin detemir  25 Units Subcutaneous BID  . levETIRAcetam  500 mg Per Tube QHS  . mouth rinse  15 mL Mouth Rinse 10 times per day  . midodrine  10 mg Per Tube Q8H  . midodrine  5 mg Per Tube NOW  . pantoprazole sodium  40 mg Per Tube Q24H  . QUEtiapine  25 mg Per Tube QHS  . sodium chloride flush  10-40 mL Intracatheter Q12H   Continuous Infusions: . sodium chloride    . dexmedetomidine (PRECEDEX) IV infusion    . feeding supplement (OSMOLITE 1.5 CAL) 1,000 mL (04/23/20 1200)  . heparin 950 Units/hr (04/25/20 1100)          Aline August, MD [facility] 04/25/2020, 11:24 AM

## 2020-04-25 NOTE — Plan of Care (Signed)
  Problem: Education: Goal: Knowledge of disease or condition will improve Outcome: Progressing Goal: Understanding of medication regimen will improve Outcome: Progressing   Problem: Education: Goal: Understanding of medication regimen will improve Outcome: Progressing

## 2020-04-25 NOTE — Progress Notes (Signed)
NAME:  Rebecca Harrell, MRN:  [id number], DOB:  1940-11-30, LOS: 97 ADMISSION DATE:  04/15/2020, CONSULTATION DATE:  04/16/2020 REFERRING MD:  Dr. Myna Hidalgo, [facility] CHIEF COMPLAINT:  04/16/2020  Brief History:  33 yoF with chronic respiratory failure s/p trach sent from [facility] for Afib with RVR.  Patient is also being treated for pneumonia on zosyn pta.  CXR concerning for possible partially loculated effusion.  Pulmonary consulted for further evaluation.   Past Medical History:  HTN, DM, Chronic diastolic CHF, CVA with Rt side weakness, Seizure, Aspiration pneumonia, s/p trach and PEG, PAF  Significant Hospital Events:  3/13 Admitted [place] 3/15 on trach collar 5L 28% 3/17 Worsening hypoxemia and increased secretions 3/18 PCCM called to see patient overnight secondary to increasing work of breathing and hypoxia secondary to possible aspiration event 3/19 air leak from trach >> improved after changing to pressure support; change to heparin gtt 3/20 transfuse 1 unit PRBC; off pressors 3/21 back on pressors; start midodrine and reduce cardizem dose. ATC tolerated for 7hrs 3/22 Trach changed to 8 Shiley cuffed due to leak  3/23 Some issues with trach bleeding post exchange but improved this AM.TR[facility] assumed as primary care, PCCM on for vent and trach care   Consults:  Cardiology Palliative care Nephrology  Procedures:   Trach exchanged 3/22 and 8 cuffed shiley placed   Significant Diagnostic Tests:   3/13 CTA PE >> pulmonary edema, small b/p effusions, small loculated component in minor fissure and oblique fissure  3/13 CT abd/ pelvis >> No acute findings within the abdomen.  3/16 renal u/s >> cholelithiasis, normal kidneys  Micro Data:  3/13 BCx>>negative 3/13 SARS 2/ flu >> negative 3/13 UCx >> 100k Vanc resistant enterococcus 3/14 MRSA >> negative 3/17 sputum >> Pseudomonas aeruginosa (resistant to fortaz and imipenem)  Antimicrobials:  Vancomycin 3/13 >> 3/14 Zosyn 3/13 Zithromax  3/13 >> 3/17 Linezolid 3/15 >> 3/17 Cefepime 3/13 >> 3/19  Interim History / Subjective:  Some issues with clots from trach exchange and desaturations but this has improved this AM  Objective   Blood pressure (!) 99/58, pulse (!) 106, temperature 99.2 F (37.3 C), temperature source Oral, resp. rate (!) 25, height '5\' 7"'$  (1.702 m), weight 86.5 kg, SpO2 100 %.    Vent Mode: PRVC FiO2 (%):  [40 %-60 %] 50 % Set Rate:  [25 bmp] 25 bmp Vt Set:  [400 mL] 400 mL PEEP:  [8 cmH20] 8 cmH20 Pressure Support:  [12 cmH20] 12 cmH20 Plateau Pressure:  [30 cmH20-33 cmH20] 30 cmH20   Intake/Output Summary (Last 24 hours) at 04/25/2020 [id number] Last data filed at 04/25/2020 0800 Gross per 24 hour  Intake 1542.06 ml  Output 506 ml  Net 1036.06 ml   Filed Weights   04/23/20 0417 04/24/20 0500 04/25/20 0500  Weight: 85.4 kg 86.5 kg 86.5 kg    Physical Exam General: Chronically ill appearing elderly deconditioned female in chronic vegitative state on mechanical ventilation, in NAD HEENT: 8 cuffed shile trach midline, MM pink/moist, PERRL,  Neuro: unresponsive CV: s1s2 regular rate and rhythm, no murmur, rubs, or gallops,  PULM:  Slight rhonchi bilaterally, no increased work of breathing GI: soft, bowel sounds active in all 4 quadrants, non-tender, non-distended, tolerating TF Extremities: warm/dry, no edema, Rt AKA  Skin: no rashes or lesions  Resolved Hospital Problem list   VRE UTI, Aspiration pneumonitis with drug resistant Pseudomonas aeruginosa  Assessment & Plan:   Acute on chronic hypoxic respiratory failure from aspiration pneumonitis. Tracheostomy status. P:  Continue to wean toward ATC as much as tolerated  Continue ventilator support with lung protective strategies  Wean PEEP and FiO2 for sats greater than 90%. Head of bed elevated 30 degrees. Plateau pressures less than 30 cm H20 Ensure adequate pulmonary hygiene  VAP bundle in place  Supportive care    Rest of acute and  chronic medical conditions including but not limited to:are not managed per TRH Hypotension from hypovolemia and sedation. Loculated pleural effusions in minor fissure and oblique fissure. Atrial fibrillation Hx of CVA with Rt hemiparesis Seizure disorder Vegetative state AKI from ATN 2nd to hypoxia and hypotension. Hyperkalemia  Positive fluid balance  Anemia of critical illness and chronic disease. 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 Goals of care Pressure injury  PCCM will continue to manage vent and trach   Best practice (evaluated daily)  Diet: tube feeds DVT prophylaxis: heparin gtt GI prophylaxis: protonix Mobility: bed rest Disposition: ICU Code Status: full code  Labs    CMP Latest Ref Rng & Units 04/24/2020 04/24/2020 04/24/2020  Glucose 70 - 99 mg/dL 236(H) - 248(H)  BUN 8 - 23 mg/dL 90(H) - 86(H)  Creatinine 0.44 - 1.00 mg/dL 4.27(H) - 4.21(H)  Sodium 135 - 145 mmol/L 134(L) - 133(L)  Potassium 3.5 - 5.1 mmol/L 5.5(H) 5.6(H) 5.8(H)  Chloride 98 - 111 mmol/L 101 - 101  CO2 22 - 32 mmol/L 24 - 24  Calcium 8.9 - 10.3 mg/dL 8.2(L) - 8.2(L)  Total Protein 6.5 - 8.1 g/dL - - -  Total Bilirubin 0.3 - 1.2 mg/dL - - -  Alkaline Phos 38 - 126 U/L - - -  AST 15 - 41 U/L - - -  ALT 0 - 44 U/L - - -    CBC Latest Ref Rng & Units 04/25/2020 04/24/2020 04/23/2020  WBC 4.0 - 10.5 K/uL 8.2 9.9 8.7  Hemoglobin 12.0 - 15.0 g/dL 7.6(L) 7.2(L) 7.4(L)  Hematocrit 36.0 - 46.0 % 25.3(L) 23.6(L) 24.6(L)  Platelets 150 - 400 K/uL 157 172 185    ABG    Component Value Date/Time   PHART 7.297 (L) 04/20/2020 1022   PCO2ART 51.8 (H) 04/20/2020 1022   PO2ART 335 (H) 04/20/2020 1022   HCO3 25.3 04/20/2020 1022   TCO2 27 04/20/2020 1022   ACIDBASEDEF 1.0 04/20/2020 1022   O2SAT 100.0 04/20/2020 1022    CBG (last 3)  Recent Labs    04/25/20 0004 04/25/20 0323 04/25/20 0712  GLUCAP 137* 151* 142*    Signature:   Johnsie Cancel, NP-C [facility] Personal contact information can be found on Amion  If no response please page: Adult pulmonary and critical care medicine pager on Amion unitl 7pm After 7pm please call [phone] 04/25/2020, 9:07 AM

## 2020-04-25 NOTE — Progress Notes (Signed)
Discussed patient's care with nurse and ICU pharmacist.  She remains hypotensive despite midodrine. Amiodarone on board for Atrial flutter, remains in a slow atrial flutter with slightly lower HR than yesterday.   IM to increase midodrine. If no impact, with worsening renal function, would strongly recommend goals of care discussion. Not a good candidate for cardioversion given no anticoagulation and overall frailty with continued medical stressors. Hypotension persists despite better control of rates, suggesting multifactorial etiology.

## 2020-04-25 NOTE — Progress Notes (Signed)
Rebecca Harrell  Assessment/ Plan: Rebecca Harrell is a 80 y.o. yo female  with PMH of DM, HFpEF, CVA with right-sided hemiparesis/contracture, s/p right BKA, PEG tube, chronic respiratory failure status post tracheostomy, she was recently at [facility] where treated for HCAP developed A. fib with RVR, hypotension and was sent to ER.  Acute kidney injury, oliguric: Suspect hemodynamically mediated AKI in the setting of A. fib with RVR, IV contrast, recent infection.  UA without proteinuria or microscopic hematuria.  Kidney ultrasound ruled out obstruction.   -Cr continues to rise but rate of rise is slow. Urine output unchanged/remains low. -Would not offer renal replacement therapy with her chronic limited function status, comorbidities. Renal replacement therapy will further compromise her overall quality of life. I recommend that there are ongoing [place] discussions with the family, appreciate palliative care's assistance. Will continue supportive management. -daily labs, monitor strict I/O  Hyperkalemia: K improved this am, continue to monitor, medical management as needed  Hypotension - on midodrine, inc'ed to 76m tid  Acute on chronic hypoxic respiratory failure, h/o trach: Currently on vent in ICU.  PCCM is following.  Severe sepsis/Healthcare associated pneumonia/septic shock: Treated with broad-spectrum antibiotics per primary team.  She was not on Levophed this morning.  Blood pressure running low.  Per PCCM team.   A. fib with RVR: On Eliquis, diltiazem.  Combined metabolic and respiratory acidosis: Improving with mechanical ventilation.  Nutrition: She has hypoalbuminemia.  Push protein  Discussed with primary service.  Subjective: uop 0.5L, no acute events. Remains hypotensive, midodrine to be adjusted. Objective Vital signs in last 24 hours: Vitals:   04/25/20 1100 04/25/20 1130 04/25/20 1144 04/25/20 1200  BP: 1'00/74 97/72 97/72 ' (!) 101/21   Pulse: (!) 108 (!) 106  (!) 106  Resp: (!) 25 (!) 25  20  Temp:      TempSrc:      SpO2: 100% 100% 100% 100%  Weight:      Height:       Weight change: 0 kg  Intake/Output Summary (Last 24 hours) at 04/25/2020 1243 Last data filed at 04/25/2020 1200 Gross per 24 hour  Intake 1872.86 ml  Output 426 ml  Net 1446.86 ml       Labs: Basic Metabolic Panel: Recent Labs  Lab 04/22/20 0341 04/23/20 0351 04/24/20 0401 04/24/20 0904 04/24/20 1228 04/25/20 0853  NA 135 136 133*  --  134* 136  K 4.4 4.9 5.8* 5.6* 5.5* 5.0  CL 100 101 101  --  101 103  CO2 '23 24 24  ' --  24 23  GLUCOSE 251* 203* 248*  --  236* 157*  BUN 68* 78* 86*  --  90* 96*  CREATININE 4.11* 4.17* 4.21*  --  4.27* 4.46*  CALCIUM 7.9* 8.1* 8.2*  --  8.2* 8.5*  PHOS 5.4* 6.0* 6.6*  --   --   --    Liver Function Tests: Recent Labs  Lab 04/22/20 0341 04/23/20 0351 04/24/20 0401  ALBUMIN 1.7* 2.0* 1.9*   No results for input(s): LIPASE, AMYLASE in the last 168 hours. No results for input(s): AMMONIA in the last 168 hours. CBC: Recent Labs  Lab 04/21/20 0316 04/22/20 0341 04/22/20 1435 04/23/20 0351 04/24/20 0401 04/25/20 0522  WBC 14.2* 8.5  --  8.7 9.9 8.2  HGB 7.1* 6.4*   < > 7.4* 7.2* 7.6*  HCT 23.1* 21.1*   < > 24.6* 23.6* 25.3*  MCV 94.3 95.5  --  96.1 95.2  94.8  PLT 234 195  --  185 172 157   < > = values in this interval not displayed.   Cardiac Enzymes: Recent Labs  Lab 04/18/20 1500  CKTOTAL 28*   CBG: Recent Labs  Lab 04/24/20 1934 04/25/20 0004 04/25/20 0323 04/25/20 0712 04/25/20 1126  GLUCAP 197* 137* 151* 142* 128*    Iron Studies:  No results for input(s): IRON, TIBC, TRANSFERRIN, FERRITIN in the last 72 hours. Studies/Results: No results found.  Medications: Infusions: . sodium chloride    . dexmedetomidine (PRECEDEX) IV infusion    . feeding supplement (OSMOLITE 1.5 CAL) 1,000 mL (04/23/20 1200)  . heparin 950 Units/hr (04/25/20 1200)    Scheduled  Medications: . amiodarone  400 mg Per Tube BID  . chlorhexidine gluconate (MEDLINE KIT)  15 mL Mouth Rinse BID  . Chlorhexidine Gluconate Cloth  6 each Topical Daily  . feeding supplement (PROSource TF)  45 mL Per Tube Daily  . fiber  1 packet Per Tube BID  . insulin aspart  0-15 Units Subcutaneous Q4H  . insulin aspart  6 Units Subcutaneous Q4H  . insulin detemir  25 Units Subcutaneous BID  . levETIRAcetam  500 mg Per Tube QHS  . mouth rinse  15 mL Mouth Rinse 10 times per day  . midodrine  10 mg Per Tube Q8H  . pantoprazole sodium  40 mg Per Tube Q24H  . QUEtiapine  25 mg Per Tube QHS  . sodium chloride flush  10-40 mL Intracatheter Q12H    have reviewed scheduled and prn medications.  Physical Exam: General: Critically ill looking female, not much responsive except painful stimuli, on mechanical ventilation,  Neck: tracheostomy presents Heart:RRR, s1s2 nl, no rub Lungs: Coarse breath sound bilateral Abdomen:soft,  non-distended Extremities: Contracture of upper extremities, right AKA. With right stump edema and LLE edema Neurology:  not responding   Gean Quint 04/25/2020,12:43 PM  LOS: 10 days

## 2020-04-25 NOTE — Progress Notes (Signed)
[place] for IV Heparin Indication: atrial fibrillation  Allergies  Allergen Reactions  . Quinolones     UNK reaction    Patient Measurements: Height: '5\' 7"'$  (170.2 cm) Weight: 86.5 kg (190 lb 11.2 oz) IBW/kg (Calculated) : 61.6 Heparin Dosing Weight: 78 kg  Vital Signs: Temp: 99.3 F (37.4 C) (03/23 0325) Temp Source: Axillary (03/23 0325) BP: 110/55 (03/23 0606) Pulse Rate: 106 (03/23 0600)  Labs: Recent Labs    04/22/20 2203 04/23/20 0351 04/24/20 0401 04/24/20 1228 04/25/20 0522  HGB  --  7.4* 7.2*  --  7.6*  HCT  --  24.6* 23.6*  --  25.3*  PLT  --  185 172  --  157  APTT 89* 78* 80*  --   --   HEPARINUNFRC  --  0.92* 0.54  --  0.31  CREATININE  --  4.17* 4.21* 4.27*  --     Estimated Creatinine Clearance: 12.5 mL/min (A) (by C-G formula based on SCr of 4.27 mg/dL (H)).  Assessment: 80 years of age female on Eliquis prior to admission for atrial fibrillation who is now in acute renal failure and minimal to no urine output and Eliquis was held after discussion with Dr. Gasper Sells on 3/18 - at that time no plans to start Heparin per that verbal discussion. Last dose of Eliquis was on 3/18 at 09:33 AM. On 3/19, pharmacy consulted by Cardiology to dose IV Heparin.  Heparin level is therapeutic and trending down.  No bleeding reported.  Goal of Therapy:  Heparin level 0.3-0.7 units/ml  Monitor platelets by anticoagulation protocol: Yes   Plan:  Increase heparin gtt to 950 units/hr Daily heparin level and CBC  Rebecca Harrell D. Mina Marble, PharmD, BCPS, [place] 04/25/2020, 7:38 AM

## 2020-04-26 DIAGNOSIS — G40909 Epilepsy, unspecified, not intractable, without status epilepticus: Secondary | ICD-10-CM | POA: Diagnosis not present

## 2020-04-26 DIAGNOSIS — N179 Acute kidney failure, unspecified: Secondary | ICD-10-CM | POA: Diagnosis not present

## 2020-04-26 DIAGNOSIS — I4891 Unspecified atrial fibrillation: Secondary | ICD-10-CM | POA: Diagnosis not present

## 2020-04-26 DIAGNOSIS — J9621 Acute and chronic respiratory failure with hypoxia: Secondary | ICD-10-CM | POA: Diagnosis not present

## 2020-04-26 LAB — GLUCOSE, CAPILLARY
Glucose-Capillary: 102 mg/dL — ABNORMAL HIGH (ref 70–99)
Glucose-Capillary: 105 mg/dL — ABNORMAL HIGH (ref 70–99)
Glucose-Capillary: 128 mg/dL — ABNORMAL HIGH (ref 70–99)
Glucose-Capillary: 131 mg/dL — ABNORMAL HIGH (ref 70–99)
Glucose-Capillary: 137 mg/dL — ABNORMAL HIGH (ref 70–99)
Glucose-Capillary: 138 mg/dL — ABNORMAL HIGH (ref 70–99)

## 2020-04-26 LAB — CBC
HCT: 21.7 % — ABNORMAL LOW (ref 36.0–46.0)
Hemoglobin: 7 g/dL — ABNORMAL LOW (ref 12.0–15.0)
MCH: 29.5 pg (ref 26.0–34.0)
MCHC: 32.3 g/dL (ref 30.0–36.0)
MCV: 91.6 fL (ref 80.0–100.0)
Platelets: 162 10*3/uL (ref 150–400)
RBC: 2.37 MIL/uL — ABNORMAL LOW (ref 3.87–5.11)
RDW: 17.2 % — ABNORMAL HIGH (ref 11.5–15.5)
WBC: 8.4 10*3/uL (ref 4.0–10.5)
nRBC: 0 % (ref 0.0–0.2)

## 2020-04-26 LAB — BASIC METABOLIC PANEL
Anion gap: 9 (ref 5–15)
BUN: 102 mg/dL — ABNORMAL HIGH (ref 8–23)
CO2: 23 mmol/L (ref 22–32)
Calcium: 8.5 mg/dL — ABNORMAL LOW (ref 8.9–10.3)
Chloride: 103 mmol/L (ref 98–111)
Creatinine, Ser: 4.64 mg/dL — ABNORMAL HIGH (ref 0.44–1.00)
GFR, Estimated: 9 mL/min — ABNORMAL LOW (ref >60)
Glucose, Bld: 151 mg/dL — ABNORMAL HIGH (ref 70–99)
Potassium: 4.9 mmol/L (ref 3.5–5.1)
Sodium: 135 mmol/L (ref 135–145)

## 2020-04-26 LAB — MAGNESIUM: Magnesium: 1.8 mg/dL (ref 1.7–2.4)

## 2020-04-26 LAB — HEPARIN LEVEL (UNFRACTIONATED)
Heparin Unfractionated: 0.22 IU/mL — ABNORMAL LOW (ref 0.30–0.70)
Heparin Unfractionated: 0.29 IU/mL — ABNORMAL LOW (ref 0.30–0.70)

## 2020-04-26 MED ORDER — AMIODARONE HCL 200 MG PO TABS
200.0000 mg | ORAL_TABLET | Freq: Every day | ORAL | Status: DC
  Administered 2020-04-30 – 2020-05-14 (×11): 200 mg
  Filled 2020-04-26 (×11): qty 1

## 2020-04-26 NOTE — Plan of Care (Signed)
°  Problem: Education: °Goal: Knowledge of disease or condition will improve °Outcome: Progressing °Goal: Understanding of medication regimen will improve °Outcome: Progressing °  °

## 2020-04-26 NOTE — Progress Notes (Signed)
[place] for IV Heparin Indication: atrial fibrillation  Allergies  Allergen Reactions  . Quinolones     UNK reaction    Patient Measurements: Height: '5\' 7"'$  (170.2 cm) Weight: 90.8 kg (200 lb 2.8 oz) IBW/kg (Calculated) : 61.6 Heparin Dosing Weight: 78 kg  Vital Signs: Temp: 98.8 F (37.1 C) (03/24 1547) Temp Source: Axillary (03/24 1547) BP: 110/73 (03/24 1800) Pulse Rate: 98 (03/24 1800)  Labs: Recent Labs    04/24/20 0401 04/24/20 1228 04/25/20 0522 04/25/20 0853 04/26/20 0159 04/26/20 1615  HGB 7.2*  --  7.6*  --  7.0*  --   HCT 23.6*  --  25.3*  --  21.7*  --   PLT 172  --  157  --  162  --   APTT 80*  --   --   --   --   --   HEPARINUNFRC 0.54  --  0.31  --  0.22* 0.29*  CREATININE 4.21* 4.27*  --  4.46* 4.64*  --     Estimated Creatinine Clearance: 11.7 mL/min (A) (by C-G formula based on SCr of 4.64 mg/dL (H)).  Assessment: 80 years of age female on Eliquis prior to admission for atrial fibrillation who is now in acute renal failure and minimal to no urine output and Eliquis was held after discussion with Dr. Gasper Sells on 3/18 - at that time no plans to start Heparin per that verbal discussion. Last dose of Eliquis was on 3/18 at 09:33 AM. On 3/19, pharmacy consulted by Cardiology to dose IV Heparin.  Heparin level is rising but still slightly below goal at 0.29 -no issues with infusion per RN and bleeding around trach site has improved. Will increase slightly and recheck in AM.  Goal of Therapy:  Heparin level 0.3-0.7 units/ml  Monitor platelets by anticoagulation protocol: Yes   Plan:  Increase heparin gtt to 1150 units/hr Daily heparin level and CBC Monitor closely for s/sx of bleeding  Sloan Leiter, PharmD, BCPS, BCCCP Clinical Pharmacist Please refer to [facility] for [place] numbers 04/26/2020, 6:23 PM

## 2020-04-26 NOTE — Progress Notes (Signed)
Progress Note  Patient Name: Rebecca Harrell Date of Encounter: 04/26/2020  [facility] Cardiologist: Werner Lean, MD   Subjective   Turns head to my voice. Cannot respond to questions.   Inpatient Medications    Scheduled Meds: . [START ON 04/30/2020] amiodarone  200 mg Per Tube Daily  . amiodarone  400 mg Per Tube BID  . chlorhexidine gluconate (MEDLINE KIT)  15 mL Mouth Rinse BID  . Chlorhexidine Gluconate Cloth  6 each Topical Daily  . feeding supplement (PROSource TF)  45 mL Per Tube Daily  . fiber  1 packet Per Tube BID  . insulin aspart  0-15 Units Subcutaneous Q4H  . insulin aspart  6 Units Subcutaneous Q4H  . insulin detemir  25 Units Subcutaneous BID  . levETIRAcetam  500 mg Per Tube QHS  . mouth rinse  15 mL Mouth Rinse 10 times per day  . midodrine  10 mg Per Tube Q8H  . pantoprazole sodium  40 mg Per Tube Q24H  . QUEtiapine  25 mg Per Tube QHS  . sodium chloride flush  10-40 mL Intracatheter Q12H   Continuous Infusions: . sodium chloride    . dexmedetomidine (PRECEDEX) IV infusion    . feeding supplement (OSMOLITE 1.5 CAL) 55 mL/hr at 04/26/20 0000  . heparin 1,100 Units/hr (04/26/20 1300)   PRN Meds: acetaminophen, fentaNYL (SUBLIMAZE) injection, ipratropium-albuterol, ondansetron (ZOFRAN) IV, oxyCODONE, sodium chloride flush   Vital Signs    Vitals:   04/26/20 1200 04/26/20 1230 04/26/20 1300 04/26/20 1330  BP: 93/66 91/67 100/75 108/76  Pulse: (!) 101 (!) 101 100 100  Resp: (!) 25 (!) 25 (!) 25 (!) 26  Temp:      TempSrc:      SpO2: 100% 100% 100% 100%  Weight:      Height:        Intake/Output Summary (Last 24 hours) at 04/26/2020 1402 Last data filed at 04/26/2020 1304 Gross per 24 hour  Intake 1596.06 ml  Output 1150 ml  Net 446.06 ml   Last 3 Weights 04/26/2020 04/25/2020 04/24/2020  Weight (lbs) 200 lb 2.8 oz 190 lb 11.2 oz 190 lb 11.2 oz  Weight (kg) 90.8 kg 86.5 kg 86.5 kg      Telemetry    Atrial flutter with HR 100 -  Personally Reviewed  ECG    2:1 atrial flutter - Personally Reviewed  Physical Exam   GEN: No acute distress.   Neck: No JVD Cardiac: RRR, no murmurs, rubs, or gallops.  Respiratory: Clear to auscultation bilaterally. GI: Soft, nontender, non-distended  MS: No edema; No deformity. Neuro:  Nonfocal  Psych: Normal affect    Labs    High Sensitivity Troponin:   Recent Labs  Lab 04/20/20 0222  TROPONINIHS 59*      Chemistry Recent Labs  Lab 04/22/20 0341 04/23/20 0351 04/24/20 0401 04/24/20 0904 04/24/20 1228 04/25/20 0853 04/26/20 0159  NA 135 136 133*  --  134* 136 135  K 4.4 4.9 5.8*   < > 5.5* 5.0 4.9  CL 100 101 101  --  101 103 103  CO2 _0 --  _1 GLUCOSE 251* 203* 248*  --  236* 157* 151*  BUN 68* 78* 86*  --  90* 96* 102*  CREATININE 4.11* 4.17* 4.21*  --  4.27* 4.46* 4.64*  CALCIUM 7.9* 8.1* 8.2*  --  8.2* 8.5* 8.5*  ALBUMIN 1.7* 2.0* 1.9*  --   --   --   --  GFRNONAA 11* 10* 10*  --  10* 10* 9*  ANIONGAP _0 --  _1 < > = values in this interval not displayed.     Hematology Recent Labs  Lab 04/24/20 0401 04/25/20 0522 04/26/20 0159  WBC 9.9 8.2 8.4  RBC 2.48* 2.67* 2.37*  HGB 7.2* 7.6* 7.0*  HCT 23.6* 25.3* 21.7*  MCV 95.2 94.8 91.6  MCH 29.0 28.5 29.5  MCHC 30.5 30.0 32.3  RDW 17.2* 17.2* 17.2*  PLT 172 157 162    BNP Recent Labs  Lab 04/20/20 0431  BNP 1,070.8*     DDimer No results for input(s): DDIMER in the last 168 hours.   Radiology    No results found.  Cardiac Studies   Echo 04/16/2020 1. Left ventricular ejection fraction, by estimation, is 55 to 60%. The  left ventricle has normal function. The left ventricle has no regional  wall motion abnormalities. Left ventricular diastolic function could not  be evaluated.  2. Right ventricular systolic function is normal. The right ventricular  size is normal. There is moderately elevated pulmonary artery systolic  pressure.  3. Left atrial  size was mildly dilated.  4. The mitral valve is grossly normal. Mild to moderate mitral valve  regurgitation.  5. Tricuspid valve regurgitation is moderate.  6. The aortic valve is calcified. There is mild calcification of the  aortic valve. There is mild thickening of the aortic valve. Aortic valve  regurgitation is mild.  7. Aortic dilatation noted. There is mild dilatation of the ascending  aorta, measuring 39 mm.   Patient Profile     80 y.o. female with PMH of DM, HFpEF, CVA , R BKA, peg tube, and chronic respiratory failure s/p trach who was treated for HCAP however developed afib with RVR and hypotension and send to ED.  Assessment & Plan    1. Atrial flutter with RVR             - patient has h/o PAF dating back to 2010 not on anticoagulation for unclear reason             - CHA2DS2-Vasc score 7 (age, CVA, HTN, DM and female)             - Echo 04/16/2020 EF 55-60%, no RWMA, moderate PA pressure, mild to moderate MR, mild AI and aortic dilatation at 39 mm        -has responded slightly to amiodarone load. Continue amiodarone load until Sunday, then transition to amiodarone 200 mg daily. This has been ordered by pharmacist, and we discussed in detail. - diltiazem stopped for hypotension.   2. Hypotension: on oral midodrine. Mildly beneficial.  3. GOC: strongly recommend palliative involvement to define medical goals of care.      For questions or updates, please contact [facility] Please consult [email or web address] for contact info under    Elouise Munroe, MD 04/26/20 2:02 PM

## 2020-04-26 NOTE — Progress Notes (Addendum)
[facility] for IV Heparin Indication: atrial fibrillation  Allergies  Allergen Reactions  . Quinolones     UNK reaction    Patient Measurements: Height: '5\' 7"'$  (170.2 cm) Weight: 90.8 kg (200 lb 2.8 oz) IBW/kg (Calculated) : 61.6 Heparin Dosing Weight: 78 kg  Vital Signs: Temp: 97.6 F (36.4 C) (03/24 0739) Temp Source: Axillary (03/24 0739) BP: 109/75 (03/24 0730) Pulse Rate: 102 (03/24 0730)  Labs: Recent Labs    04/24/20 0401 04/24/20 1228 04/25/20 0522 04/25/20 0853 04/26/20 0159  HGB 7.2*  --  7.6*  --  7.0*  HCT 23.6*  --  25.3*  --  21.7*  PLT 172  --  157  --  162  APTT 80*  --   --   --   --   HEPARINUNFRC 0.54  --  0.31  --  0.22*  CREATININE 4.21* 4.27*  --  4.46* 4.64*    Estimated Creatinine Clearance: 11.7 mL/min (A) (by C-G formula based on SCr of 4.64 mg/dL (H)).  Assessment: 80 years of age female on Eliquis prior to admission for atrial fibrillation who is now in acute renal failure and minimal to no urine output and Eliquis was held after discussion with Dr. Gasper Sells on 3/18 - at that time no plans to start Heparin per that verbal discussion. Last dose of Eliquis was on 3/18 at 09:33 AM. On 3/19, pharmacy consulted by Cardiology to dose IV Heparin.  Heparin level is sub-therapeutic this AM.  No issues with infusion; however, RN is still suctioning out blood clots with some blood-tinged secretions, possibly from suctioning.  Goal of Therapy:  Heparin level 0.3-0.7 units/ml  Monitor platelets by anticoagulation protocol: Yes   Plan:  Increase heparin gtt to 1100 units/hr Check 8 hr heparin level Daily heparin level and CBC Monitor closely for s/sx of bleeding  Raelin Pixler D. Mina Marble, PharmD, BCPS, [place] 04/26/2020, 7:43 AM

## 2020-04-26 NOTE — Progress Notes (Addendum)
Nutrition Follow-up  DOCUMENTATION CODES:   Not applicable  INTERVENTION:   Continue tube feeding via PEG: - Osmolite 1.5 @ 55 ml/hr (1320 ml/day) - ProSource TF 45 ml daily  Tube feeding regimen provides 2020 kcal, 94 grams of protein, and 1003 ml of H2O.  - Continue Nutrisource Fiber BID per tube  NUTRITION DIAGNOSIS:   Inadequate oral intake related to inability to eat as evidenced by NPO status.  Ongoing, being addressed via TF  GOAL:   Patient will meet greater than or equal to 90% of their needs  Met via TF  MONITOR:   TF tolerance,Skin,Weight trends,Labs,I & O's  REASON FOR ASSESSMENT:   Consult Assessment of nutrition requirement/status  ASSESSMENT:   80 year old with past medical history significant for hypertension, insulin-dependent diabetes, chronic diastolic heart failure, seizure disorder, history of CVA with right-sided hemiparesis, chronic hypoxic respiratory failure, aspiration with tracheostomy and PEG tube dependence, paroxysmal A. fib and currently under treatment for pneumonia at [place] who presents for evaluation of persistent tachycardia.  3/18 - placed on vent support, transferred to ICU  Tube feeding infusing at goal rate with no tolerance issues noted.  Per notes, pt tolerating ATC for short periods of time. Per Nephrology, "would not offer renal replacement therapy with her chronic limited function status, comorbidities" but indications at the moment for dialysis. Nephrology notes UOP increasing.  Palliative Care Team is involved.  Admit weight: 78.6 kg Current weight: 90.8 kg  Pt with +1 pitting generalized edema and +1 pitting edema to BUE and BLE.  Current TF: Osmolite 1.5 @ 55 ml/hr, ProSource TF daily  Patient is currently on vent support via trach. MV: 10.7 L/min Temp (24hrs), Avg:98.8 F (37.1 C), Min:97.6 F (36.4 C), Max:100.4 F (38 C)  Drips: Heparin  Medications reviewed and include: nutrisource fiber BID,  SSI q 4 hours, novolog 6 units q 4 hours, levemir 25 units BID, protonix  Labs reviewed: BUN 102, creatinine 4.64, hemoglobin 7.0 CBG's: 102-141 x 24 hours  UOP: 750 ml x 24 hours Rectal tube: 400 ml x 24 hours I/O's: 11.8 L since admit  Diet Order:   Diet Order            Diet NPO time specified  Diet effective now                 EDUCATION NEEDS:   Not appropriate for education at this time  Skin:  Skin Assessment:  Skin Integrity Issues: Stage II: coccyx Other: necrotic left third toe, right AKA, skin tear to back  Last BM:  04/26/20 type 7 via rectal tube  Height:   Ht Readings from Last 1 Encounters:  04/25/20 '5\' 7"'  (1.702 m)    Weight:   Wt Readings from Last 1 Encounters:  04/26/20 90.8 kg    BMI:  Body mass index is 31.35 kg/m.  Estimated Nutritional Needs:   Kcal:  1850-2100  Protein:  90-105 grams  Fluid:  >/= 1.8 L/day    Gustavus Bryant, MS, RD, LDN Inpatient Clinical Dietitian Please see AMiON for contact information.

## 2020-04-26 NOTE — Progress Notes (Signed)
[facility] NEPHROLOGY PROGRESS NOTE  Assessment/ Plan: Pt is a 80 y.o. yo female  with PMH of DM, HFpEF, CVA with right-sided hemiparesis/contracture, s/p right BKA, PEG tube, chronic respiratory failure status post tracheostomy, she was recently at [facility] where treated for HCAP developed A. fib with RVR, hypotension and was sent to ER.  Acute kidney injury: Suspect hemodynamically mediated AKI in the setting of A. fib with RVR, IV contrast, recent infection.  UA without proteinuria or microscopic hematuria.  Kidney ultrasound ruled out obstruction.   -Cr continues to rise but rate of rise seems to be slowing down. Urine output actually increased now which is reassuring. Hoping for renal recovery -Would not offer renal replacement therapy with her chronic limited function status, comorbidities (no indications at the moment). Renal replacement therapy will further compromise her overall quality of life. I recommend that there are ongoing [place] discussions with the family, appreciate palliative care's assistance. Will continue supportive management. -daily labs, monitor strict I/O  Hyperkalemia: K stable this am, continue to monitor, medical management as needed  Hypotension - on midodrine, inc'ed to 62m tid  Acute on chronic hypoxic respiratory failure, h/o trach: Currently on vent in ICU.  PCCM is following.  Severe sepsis/Healthcare associated pneumonia/septic shock: Treated with broad-spectrum antibiotics per primary team.  She was not on Levophed this morning.  Blood pressure running low.  Per PCCM team.   A. fib with RVR: On Eliquis, diltiazem.  Nutrition: She has hypoalbuminemia.  Push protein   Subjective: No acute events, urine output 700 cc.  Blood pressure remaines stable, no pressor requirement Objective Vital signs in last 24 hours: Vitals:   04/26/20 1000 04/26/20 1030 04/26/20 1100 04/26/20 1120  BP: 108/71 106/74 98/70   Pulse: (!) 102 100 100    Resp: (!) 25 (!) 24 20   Temp:    98 F (36.7 C)  TempSrc:    Axillary  SpO2: 100% 100% 100%   Weight:      Height:       Weight change: 4.3 kg  Intake/Output Summary (Last 24 hours) at 04/26/2020 1121 Last data filed at 04/26/2020 1100 Gross per 24 hour  Intake 1742.51 ml  Output 1150 ml  Net 592.51 ml       Labs: Basic Metabolic Panel: Recent Labs  Lab 04/22/20 0341 04/23/20 0351 04/24/20 0401 04/24/20 0904 04/24/20 1228 04/25/20 0853 04/26/20 0159  NA 135 136 133*  --  134* 136 135  K 4.4 4.9 5.8*   < > 5.5* 5.0 4.9  CL 100 101 101  --  101 103 103  CO2 _0 --  _1 GLUCOSE 251* 203* 248*  --  236* 157* 151*  BUN 68* 78* 86*  --  90* 96* 102*  CREATININE 4.11* 4.17* 4.21*  --  4.27* 4.46* 4.64*  CALCIUM 7.9* 8.1* 8.2*  --  8.2* 8.5* 8.5*  PHOS 5.4* 6.0* 6.6*  --   --   --   --    < > = values in this interval not displayed.   Liver Function Tests: Recent Labs  Lab 04/22/20 0341 04/23/20 0351 04/24/20 0401  ALBUMIN 1.7* 2.0* 1.9*   No results for input(s): LIPASE, AMYLASE in the last 168 hours. No results for input(s): AMMONIA in the last 168 hours. CBC: Recent Labs  Lab 04/22/20 0341 04/22/20 1435 04/23/20 0351 04/24/20 0401 04/25/20 0522 04/26/20 0159  WBC 8.5  --  8.7 9.9 8.2  8.4  HGB 6.4*   < > 7.4* 7.2* 7.6* 7.0*  HCT 21.1*   < > 24.6* 23.6* 25.3* 21.7*  MCV 95.5  --  96.1 95.2 94.8 91.6  PLT 195  --  185 172 157 162   < > = values in this interval not displayed.   Cardiac Enzymes: No results for input(s): CKTOTAL, CKMB, CKMBINDEX, TROPONINI in the last 168 hours. CBG: Recent Labs  Lab 04/25/20 1923 04/25/20 2334 04/26/20 0318 04/26/20 0737 04/26/20 1106  GLUCAP 112* 141* 128* 102* 105*    Iron Studies:  No results for input(s): IRON, TIBC, TRANSFERRIN, FERRITIN in the last 72 hours. Studies/Results: No results found.  Medications: Infusions: . sodium chloride    . dexmedetomidine (PRECEDEX) IV infusion    .  feeding supplement (OSMOLITE 1.5 CAL) 55 mL/hr at 04/26/20 0000  . heparin 1,100 Units/hr (04/26/20 1100)    Scheduled Medications: . [START ON 04/30/2020] amiodarone  200 mg Per Tube Daily  . amiodarone  400 mg Per Tube BID  . chlorhexidine gluconate (MEDLINE KIT)  15 mL Mouth Rinse BID  . Chlorhexidine Gluconate Cloth  6 each Topical Daily  . feeding supplement (PROSource TF)  45 mL Per Tube Daily  . fiber  1 packet Per Tube BID  . insulin aspart  0-15 Units Subcutaneous Q4H  . insulin aspart  6 Units Subcutaneous Q4H  . insulin detemir  25 Units Subcutaneous BID  . levETIRAcetam  500 mg Per Tube QHS  . mouth rinse  15 mL Mouth Rinse 10 times per day  . midodrine  10 mg Per Tube Q8H  . pantoprazole sodium  40 mg Per Tube Q24H  . QUEtiapine  25 mg Per Tube QHS  . sodium chloride flush  10-40 mL Intracatheter Q12H    have reviewed scheduled and prn medications.  Physical Exam: General: Critically ill looking female, not much responsive except painful stimuli, on mechanical ventilation,  Neck: tracheostomy presents Heart:RRR, s1s2 nl, no rub Lungs: Coarse breath sound bilateral Abdomen:soft,  non-distended Extremities: Contracture of upper extremities, right AKA. With right stump edema and LLE edema Neurology:  not responding   Gean Quint 04/26/2020,11:21 AM  LOS: 11 days

## 2020-04-26 NOTE — Progress Notes (Signed)
Patient ID: Rebecca Harrell, female   DOB: 1940/02/06, 80 y.o.   MRN: [id number]  PROGRESS NOTE    Rebecca Harrell  DTO:[id number] DOB: 1940-12-09 DOA: 04/15/2020 PCP: Aaron Edelman, MD   Brief Narrative:  80 year old female with history of chronic hypoxic respiratory failure with tracheostomy and PEG tube dependence, aspiration, hypertension, diabetes mellitus type 2, chronic diastolic CHF, seizure disorder, unspecified CVA with right-sided hemiparesis, chronic vegetative state, paroxysmal A. fib recently taken off anticoagulation, pneumonia receiving antibiotic treatment at [facility] prior to presentation presents with A. fib with RVR.  Chest x-ray was concerning for possibility partially loculated effusion.  CT was negative for PE pulmonary and cardiology were consulted.  Patient had worsening hypoxia and care was taken over by [facility] team.  She was treated with broad-spectrum antibiotics.  Sputum culture grew Pseudomonas aeruginosa.  Patient was treated with heparin drip.  She required 1 unit packed red cell transfusion on 04/22/2020.  She has had issues with hypotension requiring intermittent pressors and subsequently starting midodrine.  Lurline Idol was changed on 04/24/2020 due to leak.  Palliative care also has been consulted.  She was transferred back to [facility] service on 04/25/2020.  Assessment & Plan:   Acute on chronic hypoxic respiratory failure, history of tracheostomy Aspiration pneumonitis -PCCM following and managing tracheostomy and ventilator.  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  Patient was treated with broad-spectrum antibiotics.  Antibiotics subsequently discontinued 04/20/2020 by PCCM.  Defer further antibiotics unless she develops new infiltrates on chest x-ray or spikes fever -Pulmonary/bronchial hygiene -Had a T-max of 100.4 over the last 24 hours.  If continues to spike temperatures, might have to repeat  cultures.  Chronic hypotension -Blood pressure still on the lower side.  Continue midodrine 10 mg 3 times a day.  Pressors have been weaned off.  Continuous fentanyl has been discontinued.  Paroxysmal A. fib with RVR -Not on anticoagulation as an outpatient -Still intermittently tachycardic.  Cardiology following.  Currently on heparin drip and amiodarone via tube  AKI -Possibly from ATN from hypotension and hypoxia -Nephrology following: Follow recommendations.  Creatinine worsening, 4.64 today.  Patient would probably be not a good candidate for dialysis. -Renal ultrasound rule out obstruction. 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  Continue PEG feeding.  Continue Keppra/Seroquel. -Overall prognosis is very poor.  Palliative care reconsultation for goals of care discussion  Diabetes mellitus type II with hyperglycemia -Continue Levemir along with CBGs with SSI  Anemia of chronic disease -Hemoglobin 7 today.  Transfuse if hemoglobin is less than 7.  Has required 1 unit packed red cell transfusion on 04/22/2020  Generalized conditioning -Overall prognosis is very poor.  PCCM has reconsulted palliative care for goals of care discussion.  Will await recommendations.  Stage II coccygeal pressure injury present on admission -Continue local wound care.   DVT prophylaxis: Heparin drip Code Status: Full Family Communication: None at bedside Disposition Plan: Status is: Inpatient  Remains inpatient appropriate because:Inpatient level of care appropriate due to severity of illness   Dispo: The patient is from: LTAC              Anticipated d/c is to: LTAC              Patient currently is not medically stable to d/c.   Difficult to place  patient No  Consultants: PCCM/nephrology/palliative  care  Procedures: None  Antimicrobials:  Anti-infectives (From admission, onward)   Start     Dose/Rate Route Frequency Ordered Stop   04/19/20 1115  ceFEPIme (MAXIPIME) 2 g in sodium chloride 0.9 % 100 mL IVPB        2 g 200 mL/hr over 30 Minutes Intravenous Every 24 hours 04/18/20 1503 04/20/20 1051   04/19/20 1102  ceFEPIme (MAXIPIME) 2 g in sodium chloride 0.9 % 100 mL IVPB  Status:  Discontinued        2 g 200 mL/hr over 30 Minutes Intravenous Every 24 hours 04/18/20 1501 04/18/20 1503   04/17/20 1100  linezolid (ZYVOX) IVPB 600 mg        600 mg 300 mL/hr over 60 Minutes Intravenous Every 12 hours 04/17/20 1002 04/20/20 0032   04/16/20 1400  vancomycin (VANCOREADY) IVPB 1250 mg/250 mL  Status:  Discontinued        1,250 mg 166.7 mL/hr over 90 Minutes Intravenous Every 24 hours 04/15/20 1436 04/17/20 0813   04/16/20 0600  ceFEPIme (MAXIPIME) 2 g in sodium chloride 0.9 % 100 mL IVPB  Status:  Discontinued        2 g 200 mL/hr over 30 Minutes Intravenous Every 12 hours 04/15/20 2350 04/18/20 1501   04/16/20 0000  azithromycin (ZITHROMAX) 500 mg in sodium chloride 0.9 % 250 mL IVPB        500 mg 250 mL/hr over 60 Minutes Intravenous Every 24 hours 04/15/20 2350 04/20/20 0100   04/15/20 1700  piperacillin-tazobactam (ZOSYN) IVPB 3.375 g        3.375 g 12.5 mL/hr over 240 Minutes Intravenous  Once 04/15/20 1646 04/15/20 2101   04/15/20 1315  vancomycin (VANCOREADY) IVPB 1500 mg/300 mL        1,500 mg 150 mL/hr over 120 Minutes Intravenous  Once 04/15/20 1305 04/15/20 1617       Subjective: Patient seen and examined at bedside.  Patient had mild fever overnight.  No reported seizures, vomiting.  Objective: Vitals:   04/26/20 0700 04/26/20 0730 04/26/20 0739 04/26/20 0740  BP: 102/72 109/75  109/75  Pulse: (!) 103 (!) 102  (!) 103  Resp: (!) 25 (!) 25  (!) 25  Temp:   97.6 F (36.4 C)   TempSrc:   Axillary   SpO2: 100% 100%  100%  Weight:      Height:         Intake/Output Summary (Last 24 hours) at 04/26/2020 0757 Last data filed at 04/26/2020 0700 Gross per 24 hour  Intake 1787.26 ml  Output 1150 ml  Net 637.26 ml   Filed Weights   04/24/20 0500 04/25/20 0500 04/26/20 0500  Weight: 86.5 kg 86.5 kg 90.8 kg    Examination:  General exam: Chronically ill looking female lying in bed.  Does not follow commands.  No acute distress. ENT: On vent via tracheostomy Respiratory system: Decreased breath sounds at bases bilaterally with scattered crackles, tachypneic  cardiovascular system: Tachycardic, S1-S2 heard Gastrointestinal system: Abdomen is mildly distended, soft and nontender.  Bowel sounds are heard.  PEG tube present. Extremities: Trace lower extremity edema; no clubbing; right leg amputation present Central nervous system: Does not follow commands.  Unresponsive.  Right upper extremity contracture present.   Skin: No obvious petechiae/lesions psychiatry: Cannot assess because of mental status    Data Reviewed: I have personally reviewed following labs and imaging studies  CBC: Recent Labs  Lab 04/22/20 0341 04/22/20 1435  04/23/20 0351 04/24/20 0401 04/25/20 0522 04/26/20 0159  WBC 8.5  --  8.7 9.9 8.2 8.4  HGB 6.4* 7.7* 7.4* 7.2* 7.6* 7.0*  HCT 21.1* 24.4* 24.6* 23.6* 25.3* 21.7*  MCV 95.5  --  96.1 95.2 94.8 91.6  PLT 195  --  185 172 157 017   Basic Metabolic Panel: Recent Labs  Lab 04/20/20 0319 04/20/20 0510 04/21/20 0316 04/22/20 0341 04/23/20 0351 04/24/20 0401 04/24/20 0904 04/24/20 1228 04/25/20 0853 04/26/20 0159  NA 131*   < > 137 135 136 133*  --  134* 136 135  K 5.9*   < > 3.8 4.4 4.9 5.8* 5.6* 5.5* 5.0 4.9  CL 98  --  100 100 101 101  --  101 103 103  CO2 17*  --  _0 --  _1 GLUCOSE 492*  --  144* 251* 203* 248*  --  236* 157* 151*  BUN 56*  --  61* 68* 78* 86*  --  90* 96* 102*  CREATININE 3.95*  --  4.25* 4.11* 4.17* 4.21*  --  4.27* 4.46* 4.64*  CALCIUM 7.9*  --  8.0*  7.9* 8.1* 8.2*  --  8.2* 8.5* 8.5*  MG  --   --   --   --   --   --   --   --   --  1.8  PHOS 6.9*  --  5.1* 5.4* 6.0* 6.6*  --   --   --   --    < > = values in this interval not displayed.   GFR: Estimated Creatinine Clearance: 11.7 mL/min (A) (by C-G formula based on SCr of 4.64 mg/dL (H)). Liver Function Tests: Recent Labs  Lab 04/20/20 0319 04/21/20 0316 04/22/20 0341 04/23/20 0351 04/24/20 0401  ALBUMIN 2.3* 1.9* 1.7* 2.0* 1.9*   No results for input(s): LIPASE, AMYLASE in the last 168 hours. No results for input(s): AMMONIA in the last 168 hours. Coagulation Profile: No results for input(s): INR, PROTIME in the last 168 hours. Cardiac Enzymes: No results for input(s): CKTOTAL, CKMB, CKMBINDEX, TROPONINI in the last 168 hours. BNP (last 3 results) No results for input(s): PROBNP in the last 8760 hours. HbA1C: No results for input(s): HGBA1C in the last 72 hours. CBG: Recent Labs  Lab 04/25/20 1126 04/25/20 1553 04/25/20 1923 04/25/20 2334 04/26/20 0318  GLUCAP 128* 124* 112* 141* 128*   Lipid Profile: No results for input(s): CHOL, HDL, LDLCALC, TRIG, CHOLHDL, LDLDIRECT in the last 72 hours. Thyroid Function Tests: No results for input(s): TSH, T4TOTAL, FREET4, T3FREE, THYROIDAB in the last 72 hours. Anemia Panel: No results for input(s): VITAMINB12, FOLATE, FERRITIN, TIBC, IRON, RETICCTPCT in the last 72 hours. Sepsis Labs: Recent Labs  Lab 04/20/20 0447 04/20/20 0934  LATICACIDVEN 6.4* 2.9*    Recent Results (from the past 240 hour(s))  Culture, Respiratory w Gram Stain     Status: None   Collection Time: 04/19/20  5:38 PM   Specimen: Tracheal Aspirate; Respiratory  Result Value Ref Range Status   Specimen Description TRACHEAL ASPIRATE  Final   Special Requests NONE  Final   Gram Stain   Final    MODERATE WBC PRESENT,BOTH PMN AND MONONUCLEAR NO ORGANISMS SEEN Performed at [facility], [address] [place], [place] [address]    Culture  RARE PSEUDOMONAS AERUGINOSA  Final   Report Status 04/23/2020 FINAL  Final   Organism ID, Bacteria PSEUDOMONAS AERUGINOSA  Final  Susceptibility   Pseudomonas aeruginosa - MIC*    CEFTAZIDIME >=64 RESISTANT Resistant     CIPROFLOXACIN <=0.25 SENSITIVE Sensitive     GENTAMICIN <=1 SENSITIVE Sensitive     IMIPENEM >=16 RESISTANT Resistant     * RARE PSEUDOMONAS AERUGINOSA         Radiology Studies: No results found.      Scheduled Meds: . amiodarone  400 mg Per Tube BID  . chlorhexidine gluconate (MEDLINE KIT)  15 mL Mouth Rinse BID  . Chlorhexidine Gluconate Cloth  6 each Topical Daily  . feeding supplement (PROSource TF)  45 mL Per Tube Daily  . fiber  1 packet Per Tube BID  . insulin aspart  0-15 Units Subcutaneous Q4H  . insulin aspart  6 Units Subcutaneous Q4H  . insulin detemir  25 Units Subcutaneous BID  . levETIRAcetam  500 mg Per Tube QHS  . mouth rinse  15 mL Mouth Rinse 10 times per day  . midodrine  10 mg Per Tube Q8H  . pantoprazole sodium  40 mg Per Tube Q24H  . QUEtiapine  25 mg Per Tube QHS  . sodium chloride flush  10-40 mL Intracatheter Q12H   Continuous Infusions: . sodium chloride    . dexmedetomidine (PRECEDEX) IV infusion    . feeding supplement (OSMOLITE 1.5 CAL) 55 mL/hr at 04/26/20 0000  . heparin 950 Units/hr (04/26/20 0700)          Aline August, MD [facility] 04/26/2020, 7:57 AM

## 2020-04-27 DIAGNOSIS — I959 Hypotension, unspecified: Secondary | ICD-10-CM

## 2020-04-27 DIAGNOSIS — N179 Acute kidney failure, unspecified: Secondary | ICD-10-CM | POA: Diagnosis not present

## 2020-04-27 DIAGNOSIS — J9621 Acute and chronic respiratory failure with hypoxia: Secondary | ICD-10-CM | POA: Diagnosis not present

## 2020-04-27 DIAGNOSIS — Z930 Tracheostomy status: Secondary | ICD-10-CM | POA: Diagnosis not present

## 2020-04-27 DIAGNOSIS — I4891 Unspecified atrial fibrillation: Secondary | ICD-10-CM | POA: Diagnosis not present

## 2020-04-27 LAB — CBC
HCT: 24.3 % — ABNORMAL LOW (ref 36.0–46.0)
Hemoglobin: 7.6 g/dL — ABNORMAL LOW (ref 12.0–15.0)
MCH: 28.8 pg (ref 26.0–34.0)
MCHC: 31.3 g/dL (ref 30.0–36.0)
MCV: 92 fL (ref 80.0–100.0)
Platelets: 186 10*3/uL (ref 150–400)
RBC: 2.64 MIL/uL — ABNORMAL LOW (ref 3.87–5.11)
RDW: 17.2 % — ABNORMAL HIGH (ref 11.5–15.5)
WBC: 9.3 10*3/uL (ref 4.0–10.5)
nRBC: 0 % (ref 0.0–0.2)

## 2020-04-27 LAB — BASIC METABOLIC PANEL
Anion gap: 12 (ref 5–15)
BUN: 109 mg/dL — ABNORMAL HIGH (ref 8–23)
CO2: 22 mmol/L (ref 22–32)
Calcium: 8.7 mg/dL — ABNORMAL LOW (ref 8.9–10.3)
Chloride: 100 mmol/L (ref 98–111)
Creatinine, Ser: 4.83 mg/dL — ABNORMAL HIGH (ref 0.44–1.00)
GFR, Estimated: 9 mL/min — ABNORMAL LOW (ref >60)
Glucose, Bld: 145 mg/dL — ABNORMAL HIGH (ref 70–99)
Potassium: 5.9 mmol/L — ABNORMAL HIGH (ref 3.5–5.1)
Sodium: 134 mmol/L — ABNORMAL LOW (ref 135–145)

## 2020-04-27 LAB — GLUCOSE, CAPILLARY
Glucose-Capillary: 146 mg/dL — ABNORMAL HIGH (ref 70–99)
Glucose-Capillary: 111 mg/dL — ABNORMAL HIGH (ref 70–99)
Glucose-Capillary: 145 mg/dL — ABNORMAL HIGH (ref 70–99)
Glucose-Capillary: 137 mg/dL — ABNORMAL HIGH (ref 70–99)
Glucose-Capillary: 152 mg/dL — ABNORMAL HIGH (ref 70–99)

## 2020-04-27 LAB — HEPARIN LEVEL (UNFRACTIONATED): Heparin Unfractionated: 0.32 IU/mL (ref 0.30–0.70)

## 2020-04-27 LAB — MAGNESIUM: Magnesium: 1.9 mg/dL (ref 1.7–2.4)

## 2020-04-27 MED ORDER — FUROSEMIDE 10 MG/ML IJ SOLN
80.0000 mg | Freq: Two times a day (BID) | INTRAMUSCULAR | Status: AC
  Administered 2020-04-27 (×2): 80 mg via INTRAVENOUS
  Filled 2020-04-27 (×2): qty 8

## 2020-04-27 MED ORDER — SODIUM ZIRCONIUM CYCLOSILICATE 10 G PO PACK
10.0000 g | PACK | Freq: Two times a day (BID) | ORAL | Status: AC
  Administered 2020-04-27 (×2): 10 g via ORAL
  Filled 2020-04-27 (×2): qty 1

## 2020-04-27 MED ORDER — ALBUMIN HUMAN 25 % IV SOLN
50.0000 g | Freq: Once | INTRAVENOUS | Status: AC
  Administered 2020-04-27: 12.5 g via INTRAVENOUS
  Filled 2020-04-27: qty 200

## 2020-04-27 NOTE — Progress Notes (Signed)
Palliative Care Progress Note  Reason for consult: Goals of care  Palliative care asked to reengage as Rebecca Harrell continues to decline with progressive renal failure.  Her urine output has been decreasing and her Cr and potassium are increasing.  She is not a candidate for dialysis.  I saw and examined Rebecca Harrell today.  She is responsive and seems to try to respond to a few simple questions, but she is not able to actually engage in any higher level communication such as discussion of goals.  I attempted to call her sons and daughter.  I was only able to reach one of her children, her son Rebecca Harrell.  Rebecca Harrell expressed that he has been told about her continued decline, and he reports that family continues to discuss recommendations regarding limitations of care for his mother.  He indicates that, at this time, they are not in a position where family is open to consideration for changing plan from full aggressive care.  He tells me that he is in [place] planning to come and visit soon.  I offered to set up meeting with family to discuss.  He declined at this time.  Overall, Rebecca Harrell is in a serious situation as her body appears to be shutting down.  If her kidneys continue to fail, this is an irreversible process. Concerns regarding this are: 1) Her renal function is worsening despite medical interventions 2) She is not a candidate for dialysis 3) The expected outcome of renal failure in someone who is not a candidate for dialysis is death 4) Offering resuscitation at the time of an expected death with a non-reversible cause is non-beneficial and therefore not medically appropriate  I have communicated with all members of her care team who are in agreement with these concerns.   I still need to have the opportunity to discuss with family (hopefully I can reach them tomorrow).  If there is not consensus with family following discussion tomorrow, we may need to reach out to the ethics committee to review case  in accordance with policy regarding medically futile interventions.  Start time: 1630 End time: 1720 Total time: 50 minutes  Greater than 50%  of this time was spent counseling and coordinating care related to the above assessment and plan.  [name], MD [place] Team [phone]

## 2020-04-27 NOTE — Progress Notes (Signed)
NAME:  Rebecca Harrell, MRN:  [id number], DOB:  1940/08/08, LOS: 29 ADMISSION DATE:  04/15/2020, CONSULTATION DATE:  04/16/2020 REFERRING MD:  Dr. Myna Hidalgo, [facility] CHIEF COMPLAINT:  04/16/2020  Brief History:  29 yoF with chronic respiratory failure s/p trach sent from [facility] for Afib with RVR.  Patient is also being treated for pneumonia on zosyn pta.  CXR concerning for possible partially loculated effusion.  Pulmonary consulted for further evaluation.   Past Medical History:  HTN, DM, Chronic diastolic CHF, CVA with Rt side weakness, Seizure, Aspiration pneumonia, s/p trach and PEG, PAF  Significant Hospital Events:  3/13 Admitted [place] 3/15 on trach collar 5L 28% 3/17 Worsening hypoxemia and increased secretions 3/18 PCCM called to see patient overnight secondary to increasing work of breathing and hypoxia secondary to possible aspiration event 3/19 air leak from trach >> improved after changing to pressure support; change to heparin gtt 3/20 transfuse 1 unit PRBC; off pressors 3/21 back on pressors; start midodrine and reduce cardizem dose. ATC tolerated for 7hrs 3/22 Trach changed to 8 Shiley cuffed due to leak  3/23 Some issues with trach bleeding post exchange but improved this AM.[facility] assumed as primary care, PCCM on for vent and trach care   Consults:  Cardiology Palliative care Nephrology  Procedures:   Trach exchanged 3/22 and 8 cuffed shiley placed   Significant Diagnostic Tests:   3/13 CTA PE >> pulmonary edema, small b/p effusions, small loculated component in minor fissure and oblique fissure  3/13 CT abd/ pelvis >> No acute findings within the abdomen.  3/16 renal u/s >> cholelithiasis, normal kidneys  Micro Data:  3/13 BCx>>negative 3/13 SARS 2/ flu >> negative 3/13 UCx >> 100k Vanc resistant enterococcus 3/14 MRSA >> negative 3/17 sputum >> Pseudomonas aeruginosa (resistant to fortaz and imipenem)  Antimicrobials:  Vancomycin 3/13 >> 3/14 Zosyn 3/13 Zithromax  3/13 >> 3/17 Linezolid 3/15 >> 3/17 Cefepime 3/13 >> 3/19  Interim History / Subjective:   Still some scant blood in her secretions.  Requiring frequent suctioning and change of her inner cannula Evolving acute renal failure with associated hyperkalemia, urine output 300 cc over the last 24 hours 0.40, PEEP 5 on PRVC.  Has done minimal pressure support ventilation, none over the last 24 hours  Objective   Blood pressure 123/85, pulse 95, temperature 99.2 F (37.3 C), temperature source Oral, resp. rate (!) 25, height '5\' 7"'$  (1.702 m), weight 91.2 kg, SpO2 100 %.    Vent Mode: PRVC FiO2 (%):  [40 %] 40 % Set Rate:  [25 bmp] 25 bmp Vt Set:  [400 mL] 400 mL PEEP:  [5 cmH20] 5 cmH20 Plateau Pressure:  [23 cmH20-32 cmH20] 26 cmH20   Intake/Output Summary (Last 24 hours) at 04/27/2020 1050 Last data filed at 04/27/2020 1000 Gross per 24 hour  Intake 1557.45 ml  Output 475 ml  Net 1082.45 ml   Filed Weights   04/25/20 0500 04/26/20 0500 04/27/20 0258  Weight: 86.5 kg 90.8 kg 91.2 kg    Physical Exam General: Chronically ill, unresponsive on mechanical ventilation, no distress. HEENT: 8 cuffed Shiley trach clean and dry, no external bleeding.  Oropharynx otherwise clear. Neuro: Completely unresponsive to voice, stimulation, pain CV: Regular, no murmur PULM: Normal respiratory pattern, no increased work of breathing GI: Nondistended, positive bowel sounds, TF running Extremities: Warm, dry, no edema, right AKA Skin: No rash  Resolved Hospital Problem list   VRE UTI, Aspiration pneumonitis with drug resistant Pseudomonas aeruginosa  Assessment & Plan:  Acute on chronic hypoxic respiratory failure from aspiration pneumonitis. Tracheostomy status. Trach site bleeding after upsize 8.0 Shiley, resolving Secretions with frequent suctioning P: Continue PRVC, try to transition to PSV as able.  Hopefully this will be a bridge to ATC although not clear that she will ever get back to  this Continue to suction as needed.  Consider reculture if secretions increase, more purulence Tracheal bleeding appears to be subsided with scant blood and secretions at this time Pulmonary hygiene standard trach care VAP prevention orders   Rest of acute and chronic medical conditions including but not limited to:are not managed per TRH Hypotension from hypovolemia and sedation. Loculated pleural effusions in minor fissure and oblique fissure. Atrial fibrillation Hx of CVA with Rt hemiparesis Seizure disorder Vegetative state AKI from ATN 2nd to hypoxia and hypotension. Hyperkalemia  Positive fluid balance  Anemia of critical illness and chronic disease. 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 Goals of care Pressure injury  PCCM will continue to manage vent and trach   Best practice (evaluated daily)  Diet: tube feeds DVT prophylaxis: heparin gtt GI prophylaxis: protonix Mobility: bed rest Disposition: ICU Code Status: full code  Labs    CMP Latest Ref Rng & Units 04/27/2020 04/26/2020 04/25/2020  Glucose 70 - 99 mg/dL 145(H) 151(H) 157(H)  BUN 8 - 23 mg/dL 109(H) 102(H) 96(H)  Creatinine 0.44 - 1.00 mg/dL 4.83(H) 4.64(H) 4.46(H)  Sodium 135 - 145 mmol/L 134(L) 135 136  Potassium 3.5 - 5.1 mmol/L 5.9(H) 4.9 5.0  Chloride 98 - 111 mmol/L 100 103 103  CO2 22 - 32 mmol/L '22 23 23  '$ Calcium 8.9 - 10.3 mg/dL 8.7(L) 8.5(L) 8.5(L)  Total Protein 6.5 - 8.1 g/dL - - -  Total Bilirubin 0.3 - 1.2 mg/dL - - -  Alkaline Phos 38 - 126 U/L - - -  AST 15 - 41 U/L - - -  ALT 0 - 44 U/L - - -    CBC Latest Ref Rng & Units 04/27/2020 04/26/2020 04/25/2020  WBC 4.0 - 10.5 K/uL 9.3 8.4 8.2  Hemoglobin 12.0 - 15.0 g/dL 7.6(L) 7.0(L) 7.6(L)  Hematocrit 36.0 - 46.0 % 24.3(L) 21.7(L) 25.3(L)  Platelets 150 - 400 K/uL 186 162 157    ABG    Component Value Date/Time   PHART 7.297 (L) 04/20/2020 1022   PCO2ART 51.8 (H) 04/20/2020 1022   PO2ART 335 (H) 04/20/2020 1022   HCO3 25.3  04/20/2020 1022   TCO2 27 04/20/2020 1022   ACIDBASEDEF 1.0 04/20/2020 1022   O2SAT 100.0 04/20/2020 1022    CBG (last 3)  Recent Labs    04/26/20 2341 04/27/20 0321 04/27/20 0738  GLUCAP 138* 146* 111*    Signature:    Baltazar Apo, MD, PhD 04/27/2020, 10:55 AM [facility] [phone] or if no answer before 7:00PM call [phone] For any issues after 7:00PM please call eLink [phone]

## 2020-04-27 NOTE — Progress Notes (Addendum)
Chart reviewed and patient discussed at bedside with primary RN.   Continue per tube amiodarone load and subsequently amiodarone 200 mg daily. Was unable to tolerate AVN blocking agents due to hypotension. Patient is on heparin but not on AC at home. Receiving lasix and midodrine.  Very complex situation. From a cardiovascular perspective, no acute issues in last 48 hours. At this time, cardiology will sign off.   Goals of care discussions underway, please contact our service as needed for assistance.

## 2020-04-27 NOTE — Progress Notes (Signed)
Patient ID: Rebecca Harrell, female   DOB: 07-28-1940, 80 y.o.   MRN: [id number]  PROGRESS NOTE    Rebecca Harrell  OTL:[id number] DOB: 1940-05-13 DOA: 04/15/2020 PCP: Aaron Edelman, MD   Brief Narrative:  80 year old female with history of chronic hypoxic respiratory failure with tracheostomy and PEG tube dependence, aspiration, hypertension, diabetes mellitus type 2, chronic diastolic CHF, seizure disorder, unspecified CVA with right-sided hemiparesis, chronic vegetative state, paroxysmal A. fib recently taken off anticoagulation, pneumonia receiving antibiotic treatment at [facility] prior to presentation presents with A. fib with RVR.  Chest x-ray was concerning for possibility partially loculated effusion.  CT was negative for PE pulmonary and cardiology were consulted.  Patient had worsening hypoxia and care was taken over by [facility] team.  She was treated with broad-spectrum antibiotics.  Sputum culture grew Pseudomonas aeruginosa.  Patient was treated with heparin drip.  She required 1 unit packed red cell transfusion on 04/22/2020.  She has had issues with hypotension requiring intermittent pressors and subsequently starting midodrine.  Lurline Idol was changed on 04/24/2020 due to leak.  Palliative care also has been consulted.  She was transferred back to [facility] service on 04/25/2020.  Assessment & Plan:   Acute on chronic hypoxic respiratory failure, history of tracheostomy Aspiration pneumonitis -PCCM following and managing tracheostomy and ventilator.  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  Patient was treated with broad-spectrum antibiotics.  Antibiotics subsequently discontinued 04/20/2020 by PCCM.  Defer further antibiotics unless she develops new infiltrates on chest x-ray or spikes fever -Pulmonary/bronchial hygiene -No temperature spikes over the last 24 hours.  Chronic hypotension -Blood pressure currently improved.   Continue midodrine 10 mg 3 times a day.  Pressors have been weaned off.  Continuous fentanyl has been discontinued.  Paroxysmal A. fib with RVR -Not on anticoagulation as an outpatient -Still intermittently tachycardic.  Cardiology following.  Currently on heparin drip and amiodarone via tube  AKI -Possibly from ATN from hypotension and hypoxia -Nephrology following: Follow recommendations.  Creatinine worsening, pending today.  Patient would probably be not a good candidate for dialysis. -Renal ultrasound rule out obstruction. -Monitor labs  Hyperkalemia -Labs pending for today.  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  Continue PEG feeding.  Continue Keppra/Seroquel. -Overall prognosis is very poor.  Palliative care reconsultation for goals of care discussion is pending  Diabetes mellitus type II with hyperglycemia -Continue Levemir along with CBGs with SSI  Anemia of chronic disease -Hemoglobin 7.6 today.  Transfuse if hemoglobin is less than 7.  Has required 1 unit packed red cell transfusion on 04/22/2020  Generalized conditioning -Overall prognosis is very poor.  PCCM has reconsulted palliative care for goals of care discussion.  Will await recommendations.  Stage II coccygeal pressure injury present on admission -Continue local wound care.   DVT prophylaxis: Heparin drip Code Status: Full Family Communication: None at bedside Disposition Plan: Status is: Inpatient  Remains inpatient appropriate because:Inpatient level of care appropriate due to severity of illness   Dispo: The patient is from: LTAC              Anticipated d/c is to: LTAC              Patient currently is not medically stable to d/c.   Difficult to place patient No  Consultants: PCCM/nephrology/palliative care  Procedures: None  Antimicrobials:  Anti-infectives (From admission, onward)  Start     Dose/Rate Route Frequency Ordered Stop   04/19/20 1115  ceFEPIme (MAXIPIME) 2 g in sodium chloride 0.9 % 100 mL IVPB        2 g 200 mL/hr over 30 Minutes Intravenous Every 24 hours 04/18/20 1503 04/20/20 1051   04/19/20 1102  ceFEPIme (MAXIPIME) 2 g in sodium chloride 0.9 % 100 mL IVPB  Status:  Discontinued        2 g 200 mL/hr over 30 Minutes Intravenous Every 24 hours 04/18/20 1501 04/18/20 1503   04/17/20 1100  linezolid (ZYVOX) IVPB 600 mg        600 mg 300 mL/hr over 60 Minutes Intravenous Every 12 hours 04/17/20 1002 04/20/20 0032   04/16/20 1400  vancomycin (VANCOREADY) IVPB 1250 mg/250 mL  Status:  Discontinued        1,250 mg 166.7 mL/hr over 90 Minutes Intravenous Every 24 hours 04/15/20 1436 04/17/20 0813   04/16/20 0600  ceFEPIme (MAXIPIME) 2 g in sodium chloride 0.9 % 100 mL IVPB  Status:  Discontinued        2 g 200 mL/hr over 30 Minutes Intravenous Every 12 hours 04/15/20 2350 04/18/20 1501   04/16/20 0000  azithromycin (ZITHROMAX) 500 mg in sodium chloride 0.9 % 250 mL IVPB        500 mg 250 mL/hr over 60 Minutes Intravenous Every 24 hours 04/15/20 2350 04/20/20 0100   04/15/20 1700  piperacillin-tazobactam (ZOSYN) IVPB 3.375 g        3.375 g 12.5 mL/hr over 240 Minutes Intravenous  Once 04/15/20 1646 04/15/20 2101   04/15/20 1315  vancomycin (VANCOREADY) IVPB 1500 mg/300 mL        1,500 mg 150 mL/hr over 120 Minutes Intravenous  Once 04/15/20 1305 04/15/20 1617       Subjective: Patient seen and examined at bedside.  No overnight fever, vomiting, seizures reported. Objective: Vitals:   04/27/20 0530 04/27/20 0545 04/27/20 0600 04/27/20 0740  BP: 128/86  (!) 123/91   Pulse: 97 96 99   Resp: (!) 25 (!) 25 (!) 25   Temp:    99.2 F (37.3 C)  TempSrc:    Oral  SpO2: 100% 100% 100%   Weight:      Height:        Intake/Output Summary (Last 24 hours) at 04/27/2020 0757 Last data filed at 04/27/2020 0600 Gross per 24 hour  Intake 1703.12 ml  Output  475 ml  Net 1228.12 ml   Filed Weights   04/25/20 0500 04/26/20 0500 04/27/20 0258  Weight: 86.5 kg 90.8 kg 91.2 kg    Examination:  General exam: Chronically ill looking female lying in bed.  No distress.  Does not follow commands  ENT: On ventilator via tracheostomy Respiratory system: Bilateral decreased breath sounds bases with some crackles, intermittently tachypneic  cardiovascular system: S1-S2 heard, rate controlled at the time of my examination Gastrointestinal system: Abdomen is distended slightly, soft and nontender.  Normal bowel sounds heard.  PEG tube present. Extremities: Mild left lower extremity edema present; no cyanosis; right leg amputation present Central nervous system: Unresponsive and does not follow commands.  Right upper extremity contracture present.   Skin: No obvious ecchymosis/rashes psychiatry: Could not be assessed because of mental status    Data Reviewed: I have personally reviewed following labs and imaging studies  CBC: Recent Labs  Lab 04/23/20 0351 04/24/20 0401 04/25/20 0522 04/26/20 0159 04/27/20 0602  WBC 8.7 9.9 8.2 8.4 9.3  HGB  7.4* 7.2* 7.6* 7.0* 7.6*  HCT 24.6* 23.6* 25.3* 21.7* 24.3*  MCV 96.1 95.2 94.8 91.6 92.0  PLT 185 172 157 162 177   Basic Metabolic Panel: Recent Labs  Lab 04/21/20 0316 04/22/20 0341 04/23/20 0351 04/24/20 0401 04/24/20 0904 04/24/20 1228 04/25/20 0853 04/26/20 0159  NA 137 135 136 133*  --  134* 136 135  K 3.8 4.4 4.9 5.8* 5.6* 5.5* 5.0 4.9  CL 100 100 101 101  --  101 103 103  CO2 _0 --  _1 GLUCOSE 144* 251* 203* 248*  --  236* 157* 151*  BUN 61* 68* 78* 86*  --  90* 96* 102*  CREATININE 4.25* 4.11* 4.17* 4.21*  --  4.27* 4.46* 4.64*  CALCIUM 8.0* 7.9* 8.1* 8.2*  --  8.2* 8.5* 8.5*  MG  --   --   --   --   --   --   --  1.8  PHOS 5.1* 5.4* 6.0* 6.6*  --   --   --   --    GFR: Estimated Creatinine Clearance: 11.8 mL/min (A) (by C-G formula based on SCr of 4.64 mg/dL  (H)). Liver Function Tests: Recent Labs  Lab 04/21/20 0316 04/22/20 0341 04/23/20 0351 04/24/20 0401  ALBUMIN 1.9* 1.7* 2.0* 1.9*   No results for input(s): LIPASE, AMYLASE in the last 168 hours. No results for input(s): AMMONIA in the last 168 hours. Coagulation Profile: No results for input(s): INR, PROTIME in the last 168 hours. Cardiac Enzymes: No results for input(s): CKTOTAL, CKMB, CKMBINDEX, TROPONINI in the last 168 hours. BNP (last 3 results) No results for input(s): PROBNP in the last 8760 hours. HbA1C: No results for input(s): HGBA1C in the last 72 hours. CBG: Recent Labs  Lab 04/26/20 1528 04/26/20 1937 04/26/20 2341 04/27/20 0321 04/27/20 0738  GLUCAP 131* 137* 138* 146* 111*   Lipid Profile: No results for input(s): CHOL, HDL, LDLCALC, TRIG, CHOLHDL, LDLDIRECT in the last 72 hours. Thyroid Function Tests: No results for input(s): TSH, T4TOTAL, FREET4, T3FREE, THYROIDAB in the last 72 hours. Anemia Panel: No results for input(s): VITAMINB12, FOLATE, FERRITIN, TIBC, IRON, RETICCTPCT in the last 72 hours. Sepsis Labs: Recent Labs  Lab 04/20/20 0934  LATICACIDVEN 2.9*    Recent Results (from the past 240 hour(s))  Culture, Respiratory w Gram Stain     Status: None   Collection Time: 04/19/20  5:38 PM   Specimen: Tracheal Aspirate; Respiratory  Result Value Ref Range Status   Specimen Description TRACHEAL ASPIRATE  Final   Special Requests NONE  Final   Gram Stain   Final    MODERATE WBC PRESENT,BOTH PMN AND MONONUCLEAR NO ORGANISMS SEEN Performed at [facility], [address], [place], [place] [address]    Culture RARE PSEUDOMONAS AERUGINOSA  Final   Report Status 04/23/2020 FINAL  Final   Organism ID, Bacteria PSEUDOMONAS AERUGINOSA  Final      Susceptibility   Pseudomonas aeruginosa - MIC*    CEFTAZIDIME >=64 RESISTANT Resistant     CIPROFLOXACIN <=0.25 SENSITIVE Sensitive     GENTAMICIN <=1 SENSITIVE Sensitive     IMIPENEM >=16  RESISTANT Resistant     * RARE PSEUDOMONAS AERUGINOSA         Radiology Studies: No results found.      Scheduled Meds: . [START ON 04/30/2020] amiodarone  200 mg Per Tube Daily  . amiodarone  400 mg Per Tube BID  . chlorhexidine  gluconate (MEDLINE KIT)  15 mL Mouth Rinse BID  . Chlorhexidine Gluconate Cloth  6 each Topical Daily  . feeding supplement (PROSource TF)  45 mL Per Tube Daily  . fiber  1 packet Per Tube BID  . insulin aspart  0-15 Units Subcutaneous Q4H  . insulin aspart  6 Units Subcutaneous Q4H  . insulin detemir  25 Units Subcutaneous BID  . levETIRAcetam  500 mg Per Tube QHS  . mouth rinse  15 mL Mouth Rinse 10 times per day  . midodrine  10 mg Per Tube Q8H  . pantoprazole sodium  40 mg Per Tube Q24H  . QUEtiapine  25 mg Per Tube QHS  . sodium chloride flush  10-40 mL Intracatheter Q12H   Continuous Infusions: . sodium chloride    . dexmedetomidine (PRECEDEX) IV infusion    . feeding supplement (OSMOLITE 1.5 CAL) 1,000 mL (04/27/20 0446)  . heparin 1,150 Units/hr (04/27/20 0600)          Aline August, MD [facility] 04/27/2020, 7:57 AM

## 2020-04-27 NOTE — Progress Notes (Signed)
[facility] NEPHROLOGY PROGRESS NOTE  Assessment/ Plan: Pt is a 80 y.o. yo female  with PMH of DM, HFpEF, CVA with right-sided hemiparesis/contracture, s/p right BKA, PEG tube, chronic respiratory failure status post tracheostomy, she was recently at [facility] where treated for HCAP developed A. fib with RVR, hypotension and was sent to ER.  Acute kidney injury: Suspect hemodynamically mediated AKI in the setting of A. fib with RVR, IV contrast, recent infection.  UA without proteinuria or microscopic hematuria.  Kidney ultrasound ruled out obstruction.   -Cr continues to rise but rate of rise is slow. Her decrease in urine output today does worry me -Would not offer renal replacement therapy with her chronic limited function status, comorbidities (no indications at the moment). Renal replacement therapy will further compromise her overall quality of life. The other option is to do a time trial of dialysis but I would be hesitant on doing today since I am not sure what time I am buying with that. I recommend that there are ongoing [place] discussions with the family, appreciate palliative care's assistance. Will continue supportive management in the interim as follows: -lasix 58m BID today, lokelma x 1 day, bladder scan qshift -daily labs, monitor strict I/O  Hyperkalemia: K stable this am, continue to monitor, medical management as needed. Lokelma and lasix today  Hypotension - on midodrine  Acute on chronic hypoxic respiratory failure, h/o trach: Currently on vent in ICU.  PCCM is following.  Severe sepsis/Healthcare associated pneumonia/septic shock: Treated with broad-spectrum antibiotics per primary team.  Off pressors, on midodrine  A. fib with RVR: On Eliquis, diltiazem.  Nutrition: She has hypoalbuminemia.  Push protein  Subjective: No acute events, urine output 300cc, cr slowly climbing, now up to 4.8, k 5.9. Objective Vital signs in last 24 hours: Vitals:    04/27/20 0900 04/27/20 1000 04/27/20 1100 04/27/20 1119  BP: 123/85 123/85    Pulse: 94 95  91  Resp: (!) 25 (!) 25  (!) 25  Temp:   98.6 F (37 C)   TempSrc:   Oral   SpO2: 100% 100%  100%  Weight:      Height:       Weight change: 0.4 kg  Intake/Output Summary (Last 24 hours) at 04/27/2020 1233 Last data filed at 04/27/2020 1000 Gross per 24 hour  Intake 1425.41 ml  Output 475 ml  Net 950.41 ml       Labs: Basic Metabolic Panel: Recent Labs  Lab 04/22/20 0341 04/23/20 0351 04/24/20 0401 04/24/20 0904 04/25/20 0853 04/26/20 0159 04/27/20 0602  NA 135 136 133*   < > 136 135 134*  K 4.4 4.9 5.8*   < > 5.0 4.9 5.9*  CL 100 101 101   < > 103 103 100  CO2 _0 < > _1 GLUCOSE 251* 203* 248*   < > 157* 151* 145*  BUN 68* 78* 86*   < > 96* 102* 109*  CREATININE 4.11* 4.17* 4.21*   < > 4.46* 4.64* 4.83*  CALCIUM 7.9* 8.1* 8.2*   < > 8.5* 8.5* 8.7*  PHOS 5.4* 6.0* 6.6*  --   --   --   --    < > = values in this interval not displayed.   Liver Function Tests: Recent Labs  Lab 04/22/20 0341 04/23/20 0351 04/24/20 0401  ALBUMIN 1.7* 2.0* 1.9*   No results for input(s): LIPASE, AMYLASE in the last 168 hours. No results  for input(s): AMMONIA in the last 168 hours. CBC: Recent Labs  Lab 04/23/20 0351 04/24/20 0401 04/25/20 0522 04/26/20 0159 04/27/20 0602  WBC 8.7 9.9 8.2 8.4 9.3  HGB 7.4* 7.2* 7.6* 7.0* 7.6*  HCT 24.6* 23.6* 25.3* 21.7* 24.3*  MCV 96.1 95.2 94.8 91.6 92.0  PLT 185 172 157 162 186   Cardiac Enzymes: No results for input(s): CKTOTAL, CKMB, CKMBINDEX, TROPONINI in the last 168 hours. CBG: Recent Labs  Lab 04/26/20 1937 04/26/20 2341 04/27/20 0321 04/27/20 0738 04/27/20 1114  GLUCAP 137* 138* 146* 111* 145*    Iron Studies:  No results for input(s): IRON, TIBC, TRANSFERRIN, FERRITIN in the last 72 hours. Studies/Results: No results found.  Medications: Infusions: . sodium chloride    . feeding supplement (OSMOLITE  1.5 CAL) 1,000 mL (04/27/20 0900)  . heparin 1,150 Units/hr (04/27/20 1000)    Scheduled Medications: . [START ON 04/30/2020] amiodarone  200 mg Per Tube Daily  . amiodarone  400 mg Per Tube BID  . chlorhexidine gluconate (MEDLINE KIT)  15 mL Mouth Rinse BID  . Chlorhexidine Gluconate Cloth  6 each Topical Daily  . feeding supplement (PROSource TF)  45 mL Per Tube Daily  . fiber  1 packet Per Tube BID  . furosemide  80 mg Intravenous BID  . insulin aspart  0-15 Units Subcutaneous Q4H  . insulin aspart  6 Units Subcutaneous Q4H  . insulin detemir  25 Units Subcutaneous BID  . levETIRAcetam  500 mg Per Tube QHS  . mouth rinse  15 mL Mouth Rinse 10 times per day  . midodrine  10 mg Per Tube Q8H  . pantoprazole sodium  40 mg Per Tube Q24H  . QUEtiapine  25 mg Per Tube QHS  . sodium chloride flush  10-40 mL Intracatheter Q12H  . sodium zirconium cyclosilicate  10 g Oral BID    have reviewed scheduled and prn medications.  Physical Exam: General: Critically ill looking female, not much responsive except painful stimuli, on mechanical ventilation,  Neck: +trach Heart:RRR, s1s2 nl, no rub Lungs: Coarse breath sound bilateral Abdomen:soft,  non-distended Extremities: Contracture of upper extremities, right AKA. Neurology:  not responding, opens eyes intermittently   Gean Quint 04/27/2020,12:33 PM  LOS: 12 days

## 2020-04-27 NOTE — Progress Notes (Signed)
[place] for IV Heparin Indication: atrial fibrillation  Allergies  Allergen Reactions  . Quinolones     UNK reaction    Patient Measurements: Height: '5\' 7"'$  (170.2 cm) Weight: 91.2 kg (201 lb 1 oz) IBW/kg (Calculated) : 61.6 Heparin Dosing Weight: 78 kg  Vital Signs: Temp: 99.2 F (37.3 C) (03/25 0740) Temp Source: Oral (03/25 0740) BP: 122/80 (03/25 0800) Pulse Rate: 97 (03/25 0800)  Labs: Recent Labs    04/25/20 0522 04/25/20 0853 04/26/20 0159 04/26/20 1615 04/27/20 0602  HGB 7.6*  --  7.0*  --  7.6*  HCT 25.3*  --  21.7*  --  24.3*  PLT 157  --  162  --  186  HEPARINUNFRC 0.31  --  0.22* 0.29* 0.32  CREATININE  --  4.46* 4.64*  --  4.83*    Estimated Creatinine Clearance: 11.3 mL/min (A) (by C-G formula based on SCr of 4.83 mg/dL (H)).  Assessment: 80 years of age female on Eliquis prior to admission for atrial fibrillation who is now in acute renal failure and minimal to no urine output and Eliquis was held after discussion with Dr. Gasper Sells on 3/18 - at that time no plans to start Heparin per that verbal discussion. Last dose of Eliquis was on 3/18 at 09:33 AM. On 3/19, pharmacy consulted by Cardiology to dose IV Heparin.  Hep lvl within goal  Goal of Therapy:  Heparin level 0.3-0.7 units/ml  Monitor platelets by anticoagulation protocol: Yes   Plan:  Continue heparin gtt  1150 units/hr Daily heparin level and CBC Monitor closely for s/sx of bleeding  Barth Kirks, PharmD, [place] Pharmacist [phone]  Please check AMION for all [place] numbers  04/27/2020 8:21 AM

## 2020-04-28 DIAGNOSIS — I4891 Unspecified atrial fibrillation: Secondary | ICD-10-CM | POA: Diagnosis not present

## 2020-04-28 DIAGNOSIS — J9621 Acute and chronic respiratory failure with hypoxia: Secondary | ICD-10-CM | POA: Diagnosis not present

## 2020-04-28 DIAGNOSIS — I959 Hypotension, unspecified: Secondary | ICD-10-CM | POA: Diagnosis not present

## 2020-04-28 DIAGNOSIS — Z930 Tracheostomy status: Secondary | ICD-10-CM | POA: Diagnosis not present

## 2020-04-28 DIAGNOSIS — N179 Acute kidney failure, unspecified: Secondary | ICD-10-CM | POA: Diagnosis not present

## 2020-04-28 LAB — BASIC METABOLIC PANEL
Anion gap: 13 (ref 5–15)
Anion gap: 13 (ref 5–15)
Anion gap: 11 (ref 5–15)
BUN: 112 mg/dL — ABNORMAL HIGH (ref 8–23)
BUN: 113 mg/dL — ABNORMAL HIGH (ref 8–23)
BUN: 115 mg/dL — ABNORMAL HIGH (ref 8–23)
CO2: 21 mmol/L — ABNORMAL LOW (ref 22–32)
CO2: 21 mmol/L — ABNORMAL LOW (ref 22–32)
CO2: 23 mmol/L (ref 22–32)
Calcium: 8.9 mg/dL (ref 8.9–10.3)
Calcium: 9 mg/dL (ref 8.9–10.3)
Calcium: 9.1 mg/dL (ref 8.9–10.3)
Chloride: 101 mmol/L (ref 98–111)
Chloride: 100 mmol/L (ref 98–111)
Chloride: 101 mmol/L (ref 98–111)
Creatinine, Ser: 4.91 mg/dL — ABNORMAL HIGH (ref 0.44–1.00)
Creatinine, Ser: 4.94 mg/dL — ABNORMAL HIGH (ref 0.44–1.00)
Creatinine, Ser: 4.9 mg/dL — ABNORMAL HIGH (ref 0.44–1.00)
GFR, Estimated: 9 mL/min — ABNORMAL LOW (ref >60)
GFR, Estimated: 9 mL/min — ABNORMAL LOW (ref >60)
GFR, Estimated: 9 mL/min — ABNORMAL LOW (ref >60)
Glucose, Bld: 145 mg/dL — ABNORMAL HIGH (ref 70–99)
Glucose, Bld: 137 mg/dL — ABNORMAL HIGH (ref 70–99)
Glucose, Bld: 107 mg/dL — ABNORMAL HIGH (ref 70–99)
Potassium: 6.2 mmol/L — ABNORMAL HIGH (ref 3.5–5.1)
Potassium: 6 mmol/L — ABNORMAL HIGH (ref 3.5–5.1)
Potassium: 5.8 mmol/L — ABNORMAL HIGH (ref 3.5–5.1)
Sodium: 135 mmol/L (ref 135–145)
Sodium: 134 mmol/L — ABNORMAL LOW (ref 135–145)
Sodium: 135 mmol/L (ref 135–145)

## 2020-04-28 LAB — CBC
HCT: 23.5 % — ABNORMAL LOW (ref 36.0–46.0)
Hemoglobin: 7.1 g/dL — ABNORMAL LOW (ref 12.0–15.0)
MCH: 28.7 pg (ref 26.0–34.0)
MCHC: 30.2 g/dL (ref 30.0–36.0)
MCV: 95.1 fL (ref 80.0–100.0)
Platelets: 189 10*3/uL (ref 150–400)
RBC: 2.47 MIL/uL — ABNORMAL LOW (ref 3.87–5.11)
RDW: 17.4 % — ABNORMAL HIGH (ref 11.5–15.5)
WBC: 7.9 10*3/uL (ref 4.0–10.5)
nRBC: 0 % (ref 0.0–0.2)

## 2020-04-28 LAB — GLUCOSE, CAPILLARY
Glucose-Capillary: 120 mg/dL — ABNORMAL HIGH (ref 70–99)
Glucose-Capillary: 129 mg/dL — ABNORMAL HIGH (ref 70–99)
Glucose-Capillary: 137 mg/dL — ABNORMAL HIGH (ref 70–99)
Glucose-Capillary: 161 mg/dL — ABNORMAL HIGH (ref 70–99)
Glucose-Capillary: 84 mg/dL (ref 70–99)
Glucose-Capillary: 84 mg/dL (ref 70–99)

## 2020-04-28 LAB — POTASSIUM
Potassium: 6.3 mmol/L (ref 3.5–5.1)
Potassium: 6.3 mmol/L (ref 3.5–5.1)
Potassium: >7.5 mmol/L (ref 3.5–5.1)

## 2020-04-28 LAB — HEPARIN LEVEL (UNFRACTIONATED)
Heparin Unfractionated: 0.23 IU/mL — ABNORMAL LOW (ref 0.30–0.70)
Heparin Unfractionated: 0.4 IU/mL (ref 0.30–0.70)

## 2020-04-28 MED ORDER — INSULIN ASPART 100 UNIT/ML IV SOLN
5.0000 [IU] | Freq: Once | INTRAVENOUS | Status: AC
  Administered 2020-04-28: 5 [IU] via INTRAVENOUS

## 2020-04-28 MED ORDER — SODIUM ZIRCONIUM CYCLOSILICATE 10 G PO PACK
10.0000 g | PACK | Freq: Three times a day (TID) | ORAL | Status: DC
  Administered 2020-04-28 – 2020-04-29 (×4): 10 g
  Filled 2020-04-28 (×4): qty 1

## 2020-04-28 MED ORDER — FUROSEMIDE 10 MG/ML IJ SOLN
120.0000 mg | Freq: Once | INTRAVENOUS | Status: AC
  Administered 2020-04-28: 120 mg via INTRAVENOUS
  Filled 2020-04-28: qty 10

## 2020-04-28 MED ORDER — SODIUM ZIRCONIUM CYCLOSILICATE 10 G PO PACK: 10.0000 g | PACK | Freq: Once | ORAL | Status: DC

## 2020-04-28 MED ORDER — FUROSEMIDE 10 MG/ML IJ SOLN
120.0000 mg | Freq: Two times a day (BID) | INTRAVENOUS | Status: DC
  Administered 2020-04-28 – 2020-05-03 (×10): 120 mg via INTRAVENOUS
  Filled 2020-04-28 (×2): qty 10
  Filled 2020-04-28 (×3): qty 12
  Filled 2020-04-28: qty 2
  Filled 2020-04-28: qty 10
  Filled 2020-04-28 (×4): qty 12

## 2020-04-28 MED ORDER — DEXTROSE 50 % IV SOLN
1.0000 | Freq: Once | INTRAVENOUS | Status: AC
  Administered 2020-04-28: 50 mL via INTRAVENOUS
  Filled 2020-04-28: qty 50

## 2020-04-28 MED ORDER — SODIUM BICARBONATE 8.4 % IV SOLN
50.0000 meq | Freq: Once | INTRAVENOUS | Status: AC
  Administered 2020-04-28: 50 meq via INTRAVENOUS
  Filled 2020-04-28: qty 50

## 2020-04-28 MED ORDER — SODIUM ZIRCONIUM CYCLOSILICATE 10 G PO PACK
10.0000 g | PACK | Freq: Once | ORAL | Status: AC
  Administered 2020-04-28: 10 g via ORAL
  Filled 2020-04-28: qty 1

## 2020-04-28 MED ORDER — NEPRO/CARBSTEADY PO LIQD
1000.0000 mL | ORAL | Status: DC
  Administered 2020-04-28: 1000 mL
  Filled 2020-04-28 (×3): qty 1000

## 2020-04-28 MED ORDER — ALBUMIN HUMAN 25 % IV SOLN
25.0000 g | Freq: Once | INTRAVENOUS | Status: AC
  Administered 2020-04-28: 25 g via INTRAVENOUS
  Filled 2020-04-28: qty 100

## 2020-04-28 NOTE — Progress Notes (Signed)
[facility] NEPHROLOGY PROGRESS NOTE  Assessment/ Plan: Pt is a 80 y.o. yo female  with PMH of DM, HFpEF, CVA with right-sided hemiparesis/contracture, s/p right BKA, PEG tube, chronic respiratory failure status post tracheostomy, she was recently at [facility] where treated for HCAP developed A. fib with RVR, hypotension and was sent to ER.  Acute kidney injury: Suspect hemodynamically mediated AKI in the setting of A. fib with RVR, IV contrast, recent infection.  UA without proteinuria or microscopic hematuria.  Kidney ultrasound ruled out obstruction.   -Cr continues to rise but rate of rise is slow. Her decrease in urine output today does worry me -Would not offer renal replacement therapy with her chronic limited function status, comorbidities (no indications at the moment). Renal replacement therapy will further compromise her overall quality of life. The other option is to do a time trial of dialysis but I would be hesitant on doing today since I am not sure what time I am buying with that since she is chronically on a ventilator (which is a chronic issue) and her mental status seems to be at her baseline. This would be dependent on [name] discussions with palliative care.Ongoing discussions with family with palliative care.  Will continue supportive management -daily labs, monitor strict I/O  Hyperkalemia: stop osmolite TF's, switching to nepro. Supportive care at the moment. Lasix 120m BID, lokelma 10g tid, shifting agents. Albumin with PM lasix dose  Hypotension - on midodrine  Acute on chronic hypoxic respiratory failure, h/o trach: Currently on vent in ICU.  PCCM is following.  Severe sepsis/Healthcare associated pneumonia/septic shock: Treated with broad-spectrum antibiotics per primary team.  Off pressors, on midodrine  A. fib with RVR: On Eliquis, diltiazem.  Nutrition: She has hypoalbuminemia.  Push protein  Subjective: No acute events, urine output 600cc.  Persistently hyperkalemic. Slightly interactive for me today. Opens eyes when I call out her name. Attempts to nod her head but not sure if that is meaningful or not. BP stable Objective Vital signs in last 24 hours: Vitals:   04/28/20 1000 04/28/20 1100 04/28/20 1125 04/28/20 1200  BP: 127/90 128/87  128/84  Pulse: 100 (!) 106 (!) 106 (!) 112  Resp: (!) 25 (!) 25 (!) 28 (!) 26  Temp:    98 F (36.7 C)  TempSrc:    Axillary  SpO2: 100% 100% 100% 100%  Weight:      Height:       Weight change: 1.6 kg  Intake/Output Summary (Last 24 hours) at 04/28/2020 1251 Last data filed at 04/28/2020 1200 Gross per 24 hour  Intake 1273.13 ml  Output 600 ml  Net 673.13 ml       Labs: Basic Metabolic Panel: Recent Labs  Lab 04/22/20 0341 04/23/20 0351 04/24/20 0401 04/24/20 0904 04/27/20 0602 04/28/20 0130 04/28/20 0459 04/28/20 0720 04/28/20 1201  NA 135 136 133*   < > 134* 135 134*  --   --   K 4.4 4.9 5.8*   < > 5.9* 6.2* 6.0* 6.3* 6.3*  CL 100 101 101   < > 100 101 100  --   --   CO2 '23 24 24   ' < > 22 21* 21*  --   --   GLUCOSE 251* 203* 248*   < > 145* 145* 137*  --   --   BUN 68* 78* 86*   < > 109* 112* 113*  --   --   CREATININE 4.11* 4.17* 4.21*   < >  4.83* 4.91* 4.94*  --   --   CALCIUM 7.9* 8.1* 8.2*   < > 8.7* 8.9 9.0  --   --   PHOS 5.4* 6.0* 6.6*  --   --   --   --   --   --    < > = values in this interval not displayed.   Liver Function Tests: Recent Labs  Lab 04/22/20 0341 04/23/20 0351 04/24/20 0401  ALBUMIN 1.7* 2.0* 1.9*   No results for input(s): LIPASE, AMYLASE in the last 168 hours. No results for input(s): AMMONIA in the last 168 hours. CBC: Recent Labs  Lab 04/24/20 0401 04/25/20 0522 04/26/20 0159 04/27/20 0602 04/28/20 0130  WBC 9.9 8.2 8.4 9.3 7.9  HGB 7.2* 7.6* 7.0* 7.6* 7.1*  HCT 23.6* 25.3* 21.7* 24.3* 23.5*  MCV 95.2 94.8 91.6 92.0 95.1  PLT 172 157 162 186 189   Cardiac Enzymes: No results for input(s): CKTOTAL, CKMB,  CKMBINDEX, TROPONINI in the last 168 hours. CBG: Recent Labs  Lab 04/27/20 1959 04/28/20 0026 04/28/20 0416 04/28/20 0757 04/28/20 1200  GLUCAP 152* 129* 120* 84 161*    Iron Studies:  No results for input(s): IRON, TIBC, TRANSFERRIN, FERRITIN in the last 72 hours. Studies/Results: No results found.  Medications: Infusions: . sodium chloride    . feeding supplement (OSMOLITE 1.5 CAL) 55 mL/hr at 04/28/20 0800  . heparin 1,300 Units/hr (04/28/20 1200)    Scheduled Medications: . [START ON 04/30/2020] amiodarone  200 mg Per Tube Daily  . amiodarone  400 mg Per Tube BID  . chlorhexidine gluconate (MEDLINE KIT)  15 mL Mouth Rinse BID  . Chlorhexidine Gluconate Cloth  6 each Topical Daily  . insulin aspart  5 Units Intravenous Once   And  . dextrose  1 ampule Intravenous Once  . feeding supplement (PROSource TF)  45 mL Per Tube Daily  . fiber  1 packet Per Tube BID  . insulin aspart  0-15 Units Subcutaneous Q4H  . insulin aspart  6 Units Subcutaneous Q4H  . insulin detemir  25 Units Subcutaneous BID  . levETIRAcetam  500 mg Per Tube QHS  . mouth rinse  15 mL Mouth Rinse 10 times per day  . midodrine  10 mg Per Tube Q8H  . pantoprazole sodium  40 mg Per Tube Q24H  . QUEtiapine  25 mg Per Tube QHS  . sodium bicarbonate  50 mEq Intravenous Once  . sodium chloride flush  10-40 mL Intracatheter Q12H  . sodium zirconium cyclosilicate  10 g Per Tube TID    have reviewed scheduled and prn medications.  Physical Exam: General: Critically ill looking female, on mechanical ventilation Neck: +trach Heart:RRR, s1s2 nl Lungs: diminished breath sounds bibasilar Abdomen:soft,  non-distended Extremities: anasarca, Contracture of upper extremities, right AKA. Neurology:  opens eyes intermittently   Rebecca Harrell 04/28/2020,12:51 PM  LOS: 13 days

## 2020-04-28 NOTE — TOC Progression Note (Signed)
Transition of Care [facility]) - Progression Note    Patient Details  Name: Rebecca Harrell MRN: [id number] Date of Birth: 14-Jun-1940  Transition of Care [facility]) CM/SW Contact  Bartholomew Crews, RN Phone Number: [phone] 04/28/2020, 1:29 PM  Clinical Narrative:     Acknowledging [facility] consult for return to [place]. Patient is not from [facility], but rather from [facility] where she was residing for long term care. MD made aware - patient is not medically ready for transition to SNF. TOC following for transition needs.        Expected Discharge Plan and Services                                                 Social Determinants of Health (SDOH) Interventions    Readmission Risk Interventions No flowsheet data found.

## 2020-04-28 NOTE — Progress Notes (Signed)
[place] Progress Note Patient Name: Rebecca Harrell DOB: 1940/11/18 MRN: [id number]   Date of Service  04/28/2020  HPI/Events of Note  K+ 6.2  eICU Interventions  Lokelma 10 gm via PEG tube x 1.        Kerry Kass Sylvester Minton 04/28/2020, 3:45 AM

## 2020-04-28 NOTE — Progress Notes (Signed)
[place] for IV Heparin Indication: atrial fibrillation  Allergies  Allergen Reactions  . Quinolones     UNK reaction    Patient Measurements: Height: '5\' 7"'$  (170.2 cm) Weight: 92.8 kg (204 lb 9.4 oz) IBW/kg (Calculated) : 61.6 Heparin Dosing Weight: 78 kg  Vital Signs: Temp: 98 F (36.7 C) (03/26 1950) Temp Source: Axillary (03/26 1950) BP: 137/97 (03/26 1944) Pulse Rate: 118 (03/26 1944)  Labs: Recent Labs    04/26/20 0159 04/26/20 1615 04/27/20 0602 04/28/20 0130 04/28/20 0459 04/28/20 1918  HGB 7.0*  --  7.6* 7.1*  --   --   HCT 21.7*  --  24.3* 23.5*  --   --   PLT 162  --  186 189  --   --   HEPARINUNFRC 0.22*   < > 0.32 0.23*  --  0.40  CREATININE 4.64*  --  4.83* 4.91* 4.94*  --    < > = values in this interval not displayed.    Estimated Creatinine Clearance: 11.2 mL/min (A) (by C-G formula based on SCr of 4.94 mg/dL (H)).  Assessment: 80 years of age female on Eliquis prior to admission for atrial fibrillation who is now in acute renal failure and minimal to no urine output and Eliquis was held after discussion with Dr. Gasper Sells on 3/18 - at that time no plans to start Heparin per that verbal discussion. Last dose of Eliquis was on 3/18 at 09:33 AM. On 3/19, pharmacy consulted by Cardiology to dose IV Heparin. -heparin level at goal  Goal of Therapy:  Heparin level 0.3-0.7 units/ml  Monitor platelets by anticoagulation protocol: Yes   Plan:  Continue heparin gtt at 1300 units/hr Daily heparin level and CBC  Hildred Laser, PharmD Clinical Pharmacist **Pharmacist phone directory can now be found on [email or web address] ([email or web address]).  Listed under [place].

## 2020-04-28 NOTE — Progress Notes (Signed)
[place] for IV Heparin Indication: atrial fibrillation  Allergies  Allergen Reactions  . Quinolones     UNK reaction    Patient Measurements: Height: '5\' 7"'$  (170.2 cm) Weight: 92.8 kg (204 lb 9.4 oz) IBW/kg (Calculated) : 61.6 Heparin Dosing Weight: 78 kg  Vital Signs: Temp: 99.3 F (37.4 C) (03/26 0759) Temp Source: Oral (03/26 0759) BP: 135/80 (03/26 0600) Pulse Rate: 101 (03/26 0600)  Labs: Recent Labs    04/26/20 0159 04/26/20 1615 04/27/20 0602 04/28/20 0130 04/28/20 0459  HGB 7.0*  --  7.6* 7.1*  --   HCT 21.7*  --  24.3* 23.5*  --   PLT 162  --  186 189  --   HEPARINUNFRC 0.22* 0.29* 0.32 0.23*  --   CREATININE 4.64*  --  4.83* 4.91* 4.94*    Estimated Creatinine Clearance: 11.2 mL/min (A) (by C-G formula based on SCr of 4.94 mg/dL (H)).  Assessment: 80 years of age female on Eliquis prior to admission for atrial fibrillation who is now in acute renal failure and minimal to no urine output and Eliquis was held after discussion with Dr. Gasper Sells on 3/18 - at that time no plans to start Heparin per that verbal discussion. Last dose of Eliquis was on 3/18 at 09:33 AM. On 3/19, pharmacy consulted by Cardiology to dose IV Heparin.  Hep lvl low 0.23  Cbc low but stable no bleeding  Goal of Therapy:  Heparin level 0.3-0.7 units/ml  Monitor platelets by anticoagulation protocol: Yes   Plan:  Increase heparin gtt 1300 units/hr 1630 HL Daily heparin level and CBC Monitor closely for s/sx of bleeding  Barth Kirks, PharmD, [place] Pharmacist [phone]  Please check AMION for all [place] numbers  04/28/2020 8:29 AM

## 2020-04-28 NOTE — Progress Notes (Signed)
Patient ID: Rebecca Harrell, female   DOB: [birth year], 80 y.o.   MRN: [id number]  PROGRESS NOTE    Rebecca Harrell  [id number] DOB: 17-Mar-1940 DOA: 04/15/2020 PCP: Aaron Edelman, MD   Brief Narrative:  80 year old female with history of chronic hypoxic respiratory failure with tracheostomy and PEG tube dependence, aspiration, hypertension, diabetes mellitus type 2, chronic diastolic CHF, seizure disorder, unspecified CVA with right-sided hemiparesis, chronic vegetative state, paroxysmal A. fib recently taken off anticoagulation, pneumonia receiving antibiotic treatment at [facility] prior to presentation presents with A. fib with RVR.  Chest x-ray was concerning for possibility partially loculated effusion.  CT was negative for PE pulmonary and cardiology were consulted.  Patient had worsening hypoxia and care was taken over by [facility] team.  She was treated with broad-spectrum antibiotics.  Sputum culture grew Pseudomonas aeruginosa.  Patient was treated with heparin drip.  She required 1 unit packed red cell transfusion on 04/22/2020.  She has had issues with hypotension requiring intermittent pressors and subsequently starting midodrine.  Lurline Idol was changed on 04/24/2020 due to leak.  Palliative care also has been consulted.  She was transferred back to [facility] service on 04/25/2020.  Assessment & Plan:   Acute on chronic hypoxic respiratory failure, history of tracheostomy Aspiration pneumonitis -PCCM following and managing tracheostomy and ventilator.  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  Patient was treated with broad-spectrum antibiotics.  Antibiotics subsequently discontinued 04/20/2020 by PCCM.  Defer further antibiotics unless she develops new infiltrates on chest x-ray or spikes fever -Pulmonary/bronchial hygiene -Afebrile over the last 24 hours.  Chronic hypotension -Blood pressure currently improved.  Continue  midodrine 10 mg 3 times a day.  Pressors have been weaned off.  Continuous fentanyl has been discontinued.  Paroxysmal A. fib with RVR -Not on anticoagulation as an outpatient -Still intermittently tachycardic.  Cardiology has signed off.  Currently on heparin drip and amiodarone via tube  AKI -Possibly from ATN from hypotension and hypoxia -Nephrology following: Follow recommendations.  Creatinine worsening, 4.94.  Patient would probably be not a good candidate for dialysis. -Renal ultrasound ruled out obstruction. -Monitor labs  Hyperkalemia -Creatinine 6.2 earlier this morning and was given Lokelma.  Creatinine 6 again this morning.  Follow nephrology recommendations.  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  Continue PEG feeding.  Continue Keppra/Seroquel. -Overall prognosis is very poor.  Palliative care follow-up appreciated.  Patient remains full code.  Diabetes mellitus type II with hyperglycemia -Continue CBGs with SSI.  Blood sugar on the lower side currently.  Decrease dose of Levemir.  Anemia of chronic disease -Hemoglobin 7.1 today.  Transfuse if hemoglobin is less than 7.  Has required 1 unit packed red cell transfusion on 04/22/2020  Generalized conditioning -Overall prognosis is very poor.   -Palliative care follow-up appreciated.  Patient remains full code.  Stage II coccygeal pressure injury present on admission -Continue local wound care.   DVT prophylaxis: Heparin drip Code Status: Full Family Communication: None at bedside Disposition Plan: Status is: Inpatient  Remains inpatient appropriate because:Inpatient level of care appropriate due to severity of illness   Dispo: The patient is from: LTAC              Anticipated d/c is to: LTAC              Patient currently is not medically stable to d/c.   Difficult  to place patient No  Consultants:  PCCM/nephrology/palliative care  Procedures: None  Antimicrobials:  Anti-infectives (From admission, onward)   Start     Dose/Rate Route Frequency Ordered Stop   04/19/20 1115  ceFEPIme (MAXIPIME) 2 g in sodium chloride 0.9 % 100 mL IVPB        2 g 200 mL/hr over 30 Minutes Intravenous Every 24 hours 04/18/20 1503 04/20/20 1051   04/19/20 1102  ceFEPIme (MAXIPIME) 2 g in sodium chloride 0.9 % 100 mL IVPB  Status:  Discontinued        2 g 200 mL/hr over 30 Minutes Intravenous Every 24 hours 04/18/20 1501 04/18/20 1503   04/17/20 1100  linezolid (ZYVOX) IVPB 600 mg        600 mg 300 mL/hr over 60 Minutes Intravenous Every 12 hours 04/17/20 1002 04/20/20 0032   04/16/20 1400  vancomycin (VANCOREADY) IVPB 1250 mg/250 mL  Status:  Discontinued        1,250 mg 166.7 mL/hr over 90 Minutes Intravenous Every 24 hours 04/15/20 1436 04/17/20 0813   04/16/20 0600  ceFEPIme (MAXIPIME) 2 g in sodium chloride 0.9 % 100 mL IVPB  Status:  Discontinued        2 g 200 mL/hr over 30 Minutes Intravenous Every 12 hours 04/15/20 2350 04/18/20 1501   04/16/20 0000  azithromycin (ZITHROMAX) 500 mg in sodium chloride 0.9 % 250 mL IVPB        500 mg 250 mL/hr over 60 Minutes Intravenous Every 24 hours 04/15/20 2350 04/20/20 0100   04/15/20 1700  piperacillin-tazobactam (ZOSYN) IVPB 3.375 g        3.375 g 12.5 mL/hr over 240 Minutes Intravenous  Once 04/15/20 1646 04/15/20 2101   04/15/20 1315  vancomycin (VANCOREADY) IVPB 1500 mg/300 mL        1,500 mg 150 mL/hr over 120 Minutes Intravenous  Once 04/15/20 1305 04/15/20 1617       Subjective: Patient seen and examined at bedside.  No overnight fever, vomiting, seizures reported. Objective: Vitals:   04/28/20 0400 04/28/20 0429 04/28/20 0500 04/28/20 0600  BP: (!) 136/100  (!) 142/77 135/80  Pulse: (!) 110  (!) 120 (!) 101  Resp: (!) 30  20 (!) 25  Temp: 99 F (37.2 C)     TempSrc: Oral     SpO2: 100%  100% 100%  Weight:  92.8 kg    Height:         Intake/Output Summary (Last 24 hours) at 04/28/2020 0751 Last data filed at 04/28/2020 0600 Gross per 24 hour  Intake 1207.21 ml  Output 700 ml  Net 507.21 ml   Filed Weights   04/26/20 0500 04/27/20 0258 04/28/20 0429  Weight: 90.8 kg 91.2 kg 92.8 kg    Examination:  General exam: Chronically ill looking female lying in bed.  Does not follow commands.  No distress.   ENT: On vent via trach  respiratory system: Tachypneic; decreased breath sounds at bases bilaterally with some crackles  cardiovascular system: Tachycardic; S1 S2 heard Gastrointestinal system: Abdomen is mildly distended, soft and nontender.  Bowel sounds are heard.  PEG tube present. Extremities: No lower extremity edema present; no clubbing; right leg amputation present Central nervous system: Does not follow any commands..  Right upper extremity contracture present.   Skin: No obvious petechiae/lesions psychiatry: Cannot assess because of mental status   Data Reviewed: I have personally reviewed following labs and imaging studies  CBC: Recent Labs  Lab 04/24/20 0401 04/25/20  0522 04/26/20 0159 04/27/20 0602 04/28/20 0130  WBC 9.9 8.2 8.4 9.3 7.9  HGB 7.2* 7.6* 7.0* 7.6* 7.1*  HCT 23.6* 25.3* 21.7* 24.3* 23.5*  MCV 95.2 94.8 91.6 92.0 95.1  PLT 172 157 162 186 493   Basic Metabolic Panel: Recent Labs  Lab 04/22/20 0341 04/23/20 0351 04/24/20 0401 04/24/20 0904 04/25/20 0853 04/26/20 0159 04/27/20 0602 04/28/20 0130 04/28/20 0459  NA 135 136 133*   < > 136 135 134* 135 134*  K 4.4 4.9 5.8*   < > 5.0 4.9 5.9* 6.2* 6.0*  CL 100 101 101   < > 103 103 100 101 100  CO2 '23 24 24   ' < > '23 23 22 ' 21* 21*  GLUCOSE 251* 203* 248*   < > 157* 151* 145* 145* 137*  BUN 68* 78* 86*   < > 96* 102* 109* 112* 113*  CREATININE 4.11* 4.17* 4.21*   < > 4.46* 4.64* 4.83* 4.91* 4.94*  CALCIUM 7.9* 8.1* 8.2*   < > 8.5* 8.5* 8.7* 8.9 9.0  MG  --   --   --   --   --  1.8 1.9  --   --   PHOS 5.4* 6.0* 6.6*  --    --   --   --   --   --    < > = values in this interval not displayed.   GFR: Estimated Creatinine Clearance: 11.2 mL/min (A) (by C-G formula based on SCr of 4.94 mg/dL (H)). Liver Function Tests: Recent Labs  Lab 04/22/20 0341 04/23/20 0351 04/24/20 0401  ALBUMIN 1.7* 2.0* 1.9*   No results for input(s): LIPASE, AMYLASE in the last 168 hours. No results for input(s): AMMONIA in the last 168 hours. Coagulation Profile: No results for input(s): INR, PROTIME in the last 168 hours. Cardiac Enzymes: No results for input(s): CKTOTAL, CKMB, CKMBINDEX, TROPONINI in the last 168 hours. BNP (last 3 results) No results for input(s): PROBNP in the last 8760 hours. HbA1C: No results for input(s): HGBA1C in the last 72 hours. CBG: Recent Labs  Lab 04/27/20 1114 04/27/20 1539 04/27/20 1959 04/28/20 0026 04/28/20 0416  GLUCAP 145* 137* 152* 129* 120*   Lipid Profile: No results for input(s): CHOL, HDL, LDLCALC, TRIG, CHOLHDL, LDLDIRECT in the last 72 hours. Thyroid Function Tests: No results for input(s): TSH, T4TOTAL, FREET4, T3FREE, THYROIDAB in the last 72 hours. Anemia Panel: No results for input(s): VITAMINB12, FOLATE, FERRITIN, TIBC, IRON, RETICCTPCT in the last 72 hours. Sepsis Labs: No results for input(s): PROCALCITON, LATICACIDVEN in the last 168 hours.  Recent Results (from the past 240 hour(s))  Culture, Respiratory w Gram Stain     Status: None   Collection Time: 04/19/20  5:38 PM   Specimen: Tracheal Aspirate; Respiratory  Result Value Ref Range Status   Specimen Description TRACHEAL ASPIRATE  Final   Special Requests NONE  Final   Gram Stain   Final    MODERATE WBC PRESENT,BOTH PMN AND MONONUCLEAR NO ORGANISMS SEEN Performed at [facility], [address], [place], [place] [address]    Culture RARE PSEUDOMONAS AERUGINOSA  Final   Report Status 04/23/2020 FINAL  Final   Organism ID, Bacteria PSEUDOMONAS AERUGINOSA  Final      Susceptibility    Pseudomonas aeruginosa - MIC*    CEFTAZIDIME >=64 RESISTANT Resistant     CIPROFLOXACIN <=0.25 SENSITIVE Sensitive     GENTAMICIN <=1 SENSITIVE Sensitive     IMIPENEM >=16 RESISTANT Resistant     *  RARE PSEUDOMONAS AERUGINOSA         Radiology Studies: No results found.      Scheduled Meds: . [START ON 04/30/2020] amiodarone  200 mg Per Tube Daily  . amiodarone  400 mg Per Tube BID  . chlorhexidine gluconate (MEDLINE KIT)  15 mL Mouth Rinse BID  . Chlorhexidine Gluconate Cloth  6 each Topical Daily  . feeding supplement (PROSource TF)  45 mL Per Tube Daily  . fiber  1 packet Per Tube BID  . insulin aspart  0-15 Units Subcutaneous Q4H  . insulin aspart  6 Units Subcutaneous Q4H  . insulin detemir  25 Units Subcutaneous BID  . levETIRAcetam  500 mg Per Tube QHS  . mouth rinse  15 mL Mouth Rinse 10 times per day  . midodrine  10 mg Per Tube Q8H  . pantoprazole sodium  40 mg Per Tube Q24H  . QUEtiapine  25 mg Per Tube QHS  . sodium chloride flush  10-40 mL Intracatheter Q12H  . sodium zirconium cyclosilicate  10 g Per Tube TID   Continuous Infusions: . sodium chloride    . feeding supplement (OSMOLITE 1.5 CAL) 1,000 mL (04/27/20 2322)  . furosemide    . heparin 1,150 Units/hr (04/28/20 0600)          Aline August, MD [facility] 04/28/2020, 7:51 AM

## 2020-04-29 DIAGNOSIS — J9621 Acute and chronic respiratory failure with hypoxia: Secondary | ICD-10-CM | POA: Diagnosis not present

## 2020-04-29 DIAGNOSIS — N179 Acute kidney failure, unspecified: Secondary | ICD-10-CM | POA: Diagnosis not present

## 2020-04-29 DIAGNOSIS — Z8673 Personal history of transient ischemic attack (TIA), and cerebral infarction without residual deficits: Secondary | ICD-10-CM | POA: Diagnosis not present

## 2020-04-29 DIAGNOSIS — Z930 Tracheostomy status: Secondary | ICD-10-CM | POA: Diagnosis not present

## 2020-04-29 DIAGNOSIS — I4891 Unspecified atrial fibrillation: Secondary | ICD-10-CM | POA: Diagnosis not present

## 2020-04-29 LAB — GLUCOSE, CAPILLARY
Glucose-Capillary: 112 mg/dL — ABNORMAL HIGH (ref 70–99)
Glucose-Capillary: 150 mg/dL — ABNORMAL HIGH (ref 70–99)
Glucose-Capillary: 36 mg/dL — CL (ref 70–99)
Glucose-Capillary: 41 mg/dL — CL (ref 70–99)
Glucose-Capillary: 46 mg/dL — ABNORMAL LOW (ref 70–99)
Glucose-Capillary: 48 mg/dL — ABNORMAL LOW (ref 70–99)
Glucose-Capillary: 51 mg/dL — ABNORMAL LOW (ref 70–99)
Glucose-Capillary: 66 mg/dL — ABNORMAL LOW (ref 70–99)
Glucose-Capillary: 68 mg/dL — ABNORMAL LOW (ref 70–99)
Glucose-Capillary: 76 mg/dL (ref 70–99)
Glucose-Capillary: 78 mg/dL (ref 70–99)

## 2020-04-29 LAB — CBC
HCT: 23.5 % — ABNORMAL LOW (ref 36.0–46.0)
Hemoglobin: 7.4 g/dL — ABNORMAL LOW (ref 12.0–15.0)
MCH: 29.2 pg (ref 26.0–34.0)
MCHC: 31.5 g/dL (ref 30.0–36.0)
MCV: 92.9 fL (ref 80.0–100.0)
Platelets: 172 10*3/uL (ref 150–400)
RBC: 2.53 MIL/uL — ABNORMAL LOW (ref 3.87–5.11)
RDW: 17.4 % — ABNORMAL HIGH (ref 11.5–15.5)
WBC: 7.7 10*3/uL (ref 4.0–10.5)
nRBC: 0 % (ref 0.0–0.2)

## 2020-04-29 LAB — POTASSIUM
Potassium: 5.4 mmol/L — ABNORMAL HIGH (ref 3.5–5.1)
Potassium: 5.3 mmol/L — ABNORMAL HIGH (ref 3.5–5.1)
Potassium: 4.9 mmol/L (ref 3.5–5.1)

## 2020-04-29 LAB — BASIC METABOLIC PANEL
Anion gap: 15 (ref 5–15)
BUN: 116 mg/dL — ABNORMAL HIGH (ref 8–23)
CO2: 22 mmol/L (ref 22–32)
Calcium: 9.2 mg/dL (ref 8.9–10.3)
Chloride: 99 mmol/L (ref 98–111)
Creatinine, Ser: 5 mg/dL — ABNORMAL HIGH (ref 0.44–1.00)
GFR, Estimated: 8 mL/min — ABNORMAL LOW (ref >60)
Glucose, Bld: 73 mg/dL (ref 70–99)
Potassium: 5.3 mmol/L — ABNORMAL HIGH (ref 3.5–5.1)
Sodium: 136 mmol/L (ref 135–145)

## 2020-04-29 LAB — HEPARIN LEVEL (UNFRACTIONATED): Heparin Unfractionated: 0.35 IU/mL (ref 0.30–0.70)

## 2020-04-29 MED ORDER — DEXTROSE 50 % IV SOLN
25.0000 mL | INTRAVENOUS | Status: DC | PRN
  Administered 2020-04-29: 25 mL via INTRAVENOUS
  Filled 2020-04-29: qty 50

## 2020-04-29 MED ORDER — DEXTROSE 50 % IV SOLN
INTRAVENOUS | Status: AC
  Administered 2020-04-29: 25 g via INTRAVENOUS
  Filled 2020-04-29: qty 50

## 2020-04-29 MED ORDER — FREE WATER
30.0000 mL | Status: DC
  Administered 2020-04-29 – 2020-05-04 (×29): 30 mL

## 2020-04-29 MED ORDER — DEXTROSE 50 % IV SOLN
25.0000 g | INTRAVENOUS | Status: AC
  Administered 2020-04-29: 25 g via INTRAVENOUS

## 2020-04-29 MED ORDER — DEXTROSE 50 % IV SOLN
INTRAVENOUS | Status: AC
  Administered 2020-04-29: 50 mL
  Filled 2020-04-29: qty 50

## 2020-04-29 MED ORDER — DEXTROSE 50 % IV SOLN
INTRAVENOUS | Status: AC
  Filled 2020-04-29: qty 50

## 2020-04-29 MED ORDER — DEXTROSE 50 % IV SOLN: 25.0000 g | INTRAVENOUS | Status: AC

## 2020-04-29 MED ORDER — MIDODRINE HCL 5 MG PO TABS
5.0000 mg | ORAL_TABLET | Freq: Three times a day (TID) | ORAL | Status: DC
  Administered 2020-04-29 – 2020-05-14 (×38): 5 mg
  Filled 2020-04-29 (×39): qty 1

## 2020-04-29 MED ORDER — VITAL AF 1.2 CAL PO LIQD
1000.0000 mL | ORAL | Status: DC
  Administered 2020-04-29 – 2020-05-02 (×6): 1000 mL
  Filled 2020-04-29 (×3): qty 1000

## 2020-04-29 MED ORDER — JUVEN PO PACK
1.0000 | PACK | Freq: Two times a day (BID) | ORAL | Status: DC
  Administered 2020-04-30 – 2020-05-07 (×13): 1
  Filled 2020-04-29 (×12): qty 1

## 2020-04-29 NOTE — Progress Notes (Signed)
[facility] NEPHROLOGY PROGRESS NOTE  Assessment/ Plan: Pt is a 80 y.o. yo female  with PMH of DM, HFpEF, CVA with right-sided hemiparesis/contracture, s/p right BKA, PEG tube, chronic respiratory failure status post tracheostomy, she was recently at [facility] where treated for HCAP developed A. fib with RVR, hypotension and was sent to ER.  Acute kidney injury: Suspect hemodynamically mediated AKI in the setting of A. fib with RVR, IV contrast, recent infection.  UA without proteinuria or microscopic hematuria.  Kidney ultrasound ruled out obstruction.   -Cr continues to rise but rate of rise is slow. Her decrease in urine output today does worry me -Would not offer renal replacement therapy with her chronic limited function status, comorbidities (no indications at the moment). Renal replacement therapy will further compromise her overall quality of life. The other option is to do a time trial of dialysis but I would be hesitant on doing today since I am not sure what time I am buying with that since she is chronically on a ventilator (which is a chronic issue) and her mental status seems to be at her baseline. This would be dependent on [place] discussions with palliative care. Ongoing discussions with family with palliative care.  Will continue supportive management -I discussed with daughter Marlowe Alt) just now over the phone about my concerns. The topics that we discussed are my concerns in regards to her candidacy for dialysis and the reasons why (ventilator dependency, dependent on tube feeding, hemiparesis, unable to communicate, bed-bound, most importantly how this will not improve her quality of life as is, etc), potential complications that come with being on dialysis (low blood pressure especially in her case, cardiovascular risk), potential complications with access placement. I did explain to her that there is no urgent indication to consider starting dialysis at the  moment but I did reiterate that from our perspective, she is not a candidate for renal replacement therapy. I reassured her that I will be talking to my on-coming colleague and I did reassure her that me and her other providers are keeping each informed. When I asked [name] about her thoughts and concerns, she just replied with "I have to talk to my brothers and I will ask my mom what she wants." I did explain to her that I did express my concerns and asked the patient/her mom about if she would ever want to even DO dialysis and the patient mouthed 'No' and shook her head. Patient did nod her head and started tearing when I asked for permission to call her daughter. Daughter was appreciative of my phone call and understood what we had discussed. Offered her opportunity of ask questions or express any concerns and she did not have any at that moment. Palliative and ethics are aware and are on board. Appreciate the assistance in this complex but sad case. -No urgent indications to consider RRT, medical management in the interm -daily labs, monitor strict I/O please  Hyperkalemia, improved: stopped osmolite TF's, switched to nepro. Supportive care at the moment. C/w Lasix $RemoveB'120mg'vFJhtKSr$  BID, c/w lokelma 10g tid, shifting agents prn. Can check labs again in AM as she is getting stuck frequently  Hypotension, h/o HTN - on midodrine  Acute on chronic hypoxic respiratory failure, h/o trach: Currently on vent in ICU.  PCCM is following.  Severe sepsis/Healthcare associated pneumonia/septic shock: Treated with broad-spectrum antibiotics per primary team.  Off pressors, on midodrine  A. fib with RVR: On Eliquis, diltiazem.  Nutrition: She has hypoalbuminemia.  Push protein  Subjective: No acute events, K better. uop 450cc with 2 unmeasured voids. I updated patient in regards to her kidney function and concerns. Asked her about ever wanted dialysis and she is mouthing 'No' and shaking her head. She did nod her head  and say yes when I asked if it was okay for me to call her daughter (see assessment/plan) Objective Vital signs in last 24 hours: Vitals:   04/29/20 0813 04/29/20 0900 04/29/20 1000 04/29/20 1101  BP: 123/79 130/78 132/84 122/75  Pulse: 94 93 93 95  Resp: (!) 25 (!) 25 (!) 25 (!) 25  Temp:      TempSrc:      SpO2: 100% 99% 97% 100%  Weight:      Height:       Weight change: 1.9 kg  Intake/Output Summary (Last 24 hours) at 04/29/2020 1133 Last data filed at 04/29/2020 1000 Gross per 24 hour  Intake 886.96 ml  Output 1550 ml  Net -663.04 ml       Labs: Basic Metabolic Panel: Recent Labs  Lab 04/23/20 0351 04/24/20 0401 04/24/20 0904 04/28/20 0459 04/28/20 0720 04/28/20 1918 04/28/20 2317 04/29/20 0320 04/29/20 0726  NA 136 133*   < > 134*  --  135  --  136  --   K 4.9 5.8*   < > 6.0*   < > 5.8* 5.4* 5.3* 5.3*  CL 101 101   < > 100  --  101  --  99  --   CO2 24 24   < > 21*  --  23  --  22  --   GLUCOSE 203* 248*   < > 137*  --  107*  --  73  --   BUN 78* 86*   < > 113*  --  115*  --  116*  --   CREATININE 4.17* 4.21*   < > 4.94*  --  4.90*  --  5.00*  --   CALCIUM 8.1* 8.2*   < > 9.0  --  9.1  --  9.2  --   PHOS 6.0* 6.6*  --   --   --   --   --   --   --    < > = values in this interval not displayed.   Liver Function Tests: Recent Labs  Lab 04/23/20 0351 04/24/20 0401  ALBUMIN 2.0* 1.9*   No results for input(s): LIPASE, AMYLASE in the last 168 hours. No results for input(s): AMMONIA in the last 168 hours. CBC: Recent Labs  Lab 04/25/20 0522 04/26/20 0159 04/27/20 0602 04/28/20 0130 04/29/20 0320  WBC 8.2 8.4 9.3 7.9 7.7  HGB 7.6* 7.0* 7.6* 7.1* 7.4*  HCT 25.3* 21.7* 24.3* 23.5* 23.5*  MCV 94.8 91.6 92.0 95.1 92.9  PLT 157 162 186 189 172   Cardiac Enzymes: No results for input(s): CKTOTAL, CKMB, CKMBINDEX, TROPONINI in the last 168 hours. CBG: Recent Labs  Lab 04/29/20 0056 04/29/20 0114 04/29/20 0515 04/29/20 0535 04/29/20 0743   GLUCAP 41* 150* 48* 112* 66*    Iron Studies:  No results for input(s): IRON, TIBC, TRANSFERRIN, FERRITIN in the last 72 hours. Studies/Results: No results found.  Medications: Infusions: . sodium chloride    . feeding supplement (NEPRO CARB STEADY) Stopped (04/28/20 1807)  . furosemide Stopped (04/29/20 0945)  . heparin 1,300 Units/hr (04/29/20 1000)    Scheduled Medications: . [START ON 04/30/2020] amiodarone  200 mg Per Tube Daily  . amiodarone  400 mg Per Tube BID  . chlorhexidine gluconate (MEDLINE KIT)  15 mL Mouth Rinse BID  . Chlorhexidine Gluconate Cloth  6 each Topical Daily  . feeding supplement (PROSource TF)  45 mL Per Tube Daily  . fiber  1 packet Per Tube BID  . insulin aspart  0-15 Units Subcutaneous Q4H  . levETIRAcetam  500 mg Per Tube QHS  . mouth rinse  15 mL Mouth Rinse 10 times per day  . midodrine  5 mg Per Tube Q8H  . pantoprazole sodium  40 mg Per Tube Q24H  . QUEtiapine  25 mg Per Tube QHS  . sodium chloride flush  10-40 mL Intracatheter Q12H  . sodium zirconium cyclosilicate  10 g Per Tube TID    have reviewed scheduled and prn medications.  Physical Exam: General: Critically ill looking female, on mechanical ventilation Neck: +trach Heart:RRR, s1s2 nl Lungs: diminished breath sounds bibasilar Abdomen:soft,  non-distended Extremities: anasarca, Contracture of upper extremity, right AKA. Neurology:  Following commands, mouthing yes or no and shaking her head, awake and responsive   Kendrea Cerritos 04/29/2020,11:33 AM  LOS: 14 days

## 2020-04-29 NOTE — Progress Notes (Signed)
[place] for IV Heparin Indication: atrial fibrillation  Allergies  Allergen Reactions  . Quinolones     UNK reaction    Patient Measurements: Height: '5\' 7"'$  (170.2 cm) Weight: 94.7 kg (208 lb 12.4 oz) IBW/kg (Calculated) : 61.6 Heparin Dosing Weight: 78 kg  Vital Signs: Temp: 98.8 F (37.1 C) (03/27 0741) Temp Source: Oral (03/27 0741) BP: 131/86 (03/27 0700) Pulse Rate: 96 (03/27 0700)  Labs: Recent Labs    04/27/20 0602 04/28/20 0130 04/28/20 0459 04/28/20 1918 04/29/20 0320  HGB 7.6* 7.1*  --   --  7.4*  HCT 24.3* 23.5*  --   --  23.5*  PLT 186 189  --   --  172  HEPARINUNFRC 0.32 0.23*  --  0.40 0.35  CREATININE 4.83* 4.91* 4.94* 4.90* 5.00*    Estimated Creatinine Clearance: 11.1 mL/min (A) (by C-G formula based on SCr of 5 mg/dL (H)).  Assessment: 80 years of age female on Eliquis prior to admission for atrial fibrillation who is now in acute renal failure and minimal to no urine output and Eliquis was held after discussion with Dr. Gasper Sells on 3/18 - at that time no plans to start Heparin per that verbal discussion. Last dose of Eliquis was on 3/18 at 09:33 AM. On 3/19, pharmacy consulted by Cardiology to dose IV Heparin.  Hep lvl within goal 0.25  Cbc low but stable no bleeding  Goal of Therapy:  Heparin level 0.3-0.7 units/ml  Monitor platelets by anticoagulation protocol: Yes   Plan:  Continue heparin gtt 1300 units/hr Daily heparin level and CBC Monitor closely for s/sx of bleeding  Barth Kirks, PharmD, [place] Pharmacist [phone]  Please check AMION for all [place] numbers  04/29/2020 8:17 AM

## 2020-04-29 NOTE — Progress Notes (Signed)
Nutrition Follow-up  DOCUMENTATION CODES:   Not applicable  INTERVENTION:   Initiate Vital 1.2 '@55ml'$ /hr + ProSource TF 68m daily via tube    Free water flushes 348mq4 hours to maintain tube patency   Regimen provides 1624kcal/day, 110g/day protein, '2171mg'$ /day potassium and 125053may free water   Juven Fruit Punch BID via tube, each serving provides 95kcal and 2.5g of protein (amino acids glutamine and arginine)  NUTRITION DIAGNOSIS:   Inadequate oral intake related to inability to eat as evidenced by NPO status. Ongoing, being addressed via TF  GOAL:   Provide needs based on ASPEN/SCCM guidelines  MONITOR:   Vent status,Labs,Weight trends,TF tolerance,Skin,I & O's  REASON FOR ASSESSMENT:   Consult Assessment of nutrition requirement/status  ASSESSMENT:   [age]ar old female with history of chronic hypoxic respiratory failure with tracheostomy and PEG tube dependence, aspiration, hypertension, diabetes mellitus type 2, chronic diastolic CHF, seizure disorder, unspecified CVA with right-sided hemiparesis, chronic vegetative state, paroxysmal A. fib recently taken off anticoagulation, [facility] presents with A. fib with RVR and HCAP  Tube feeds currently being held as pt with hyperkalemia. Received page from RN, MD requesting for pt to be initiated on a lower potassium formula. Spoke with MD, will initiate a elemental formula that will be lower in potassium. Pt with trach/PEG tube. Pt currently on PRVC mode. RD will add Juven to support wound healing. Per chart, pt is up ~38lbs since admit. Family deciding about dialysis. Palliative care following for GOC.     Medications reviewed and include: insulin, protonix, lokelma, lasix, heparin  Labs reviewed: Na 136 wnl, K 4.9 wnl, BUN 116(H), creat 5.0(H) Hgb 7.4(L), Hct 23.5(L) cbgs- 66, 46, 78 x 24 hrs  Patient is currently intubated on ventilator support MV: 10.4  L/min Temp (24hrs), Avg:98.4 F (36.9 C), Min:97.9 F (36.6 C), Max:99.2 F (37.3 C)  Propofol: none   MAP- >58m93m UOP- 450ml30miet Order:   Diet Order            Diet NPO time specified  Diet effective now                EDUCATION NEEDS:   Not appropriate for education at this time  Skin:  Skin Assessment:  Skin Integrity Issues: Stage II: coccyx Other: necrotic left third toe, right AKA, skin tear to back  Last BM:  3/27- type 7- 600ml 19mrectal pouch  Height:   Ht Readings from Last 1 Encounters:  04/25/20 '5\' 7"'$  (1.702 m)    Weight:   Wt Readings from Last 1 Encounters:  04/29/20 94.7 kg    BMI:  Body mass index is 32.7 kg/m.  Estimated Nutritional Needs:   Kcal:  1580kcal/day- using [id number]  Protein:  100-115g/day  Fluid:  1.5L/day  Cadyn Fann Koleen DistanceD, LDN Please refer to AMION [facility]D and/or RD on-call/weekend/after hours pager

## 2020-04-29 NOTE — Progress Notes (Signed)
Patient ID: Rebecca Harrell, female   DOB: 03/06/1940, 80 y.o.   MRN: [id number]  PROGRESS NOTE    Rebecca Harrell  BHA:[id number] DOB: 29-Jun-1940 DOA: 04/15/2020 PCP: Aaron Edelman, MD   Brief Narrative:  80 year old female with history of chronic hypoxic respiratory failure with tracheostomy and PEG tube dependence, aspiration, hypertension, diabetes mellitus type 2, chronic diastolic CHF, seizure disorder, unspecified CVA with right-sided hemiparesis, chronic vegetative state, paroxysmal A. fib recently taken off anticoagulation, pneumonia receiving antibiotic treatment at [facility] prior to presentation presents with A. fib with RVR.  Chest x-ray was concerning for possibility partially loculated effusion.  CT was negative for PE pulmonary and cardiology were consulted.  Patient had worsening hypoxia and care was taken over by [facility] team.  She was treated with broad-spectrum antibiotics.  Sputum culture grew Pseudomonas aeruginosa.  Patient was treated with heparin drip.  She required 1 unit packed red cell transfusion on 04/22/2020.  She has had issues with hypotension requiring intermittent pressors and subsequently starting midodrine.  Lurline Idol was changed on 04/24/2020 due to leak.  Palliative care also has been consulted.  She was transferred back to [facility] service on 04/25/2020.  Assessment & Plan:   Acute on chronic hypoxic respiratory failure, history of tracheostomy Aspiration pneumonitis -PCCM following and managing tracheostomy and ventilator.  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  Patient was treated with broad-spectrum antibiotics.  Antibiotics subsequently discontinued 04/20/2020 by PCCM.  Defer further antibiotics unless she develops new infiltrates on chest x-ray or spikes fever -Pulmonary/bronchial hygiene -Afebrile currently.  Chronic hypotension -Blood pressure currently improved.  Decrease midodrine to 5 mg  3 times a day.  Pressors have been weaned off.  Continuous fentanyl has been discontinued.  Paroxysmal A. fib with RVR -Not on anticoagulation as an outpatient -Mostly rate controlled currently.  Cardiology has signed off.  Currently on heparin drip and amiodarone via tube  AKI -Possibly from ATN from hypotension and hypoxia -Nephrology following: Follow recommendations.  Creatinine worsening, 5.  -Parenting patient is a good candidate for dialysis -Renal ultrasound ruled out obstruction. -Monitor labs  Hyperkalemia -Creatinine 5.3 earlier this morning.  Currently on Lokelma.  Follow nephrology recommendations.  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  Continue PEG feeding.  Continue Keppra/Seroquel. -Overall prognosis is very poor.  Palliative care follow-up appreciated.  Patient remains full code. -It seems that her overall care is very futile especially now since the creatinine is trending upwards.  I do not think patient is a candidate for dialysis.  Family should focus on total comfort measures.  Diabetes mellitus type II with hypoglycemia -Continue CBGs with SSI.  Discontinue Levemir because of hypoglycemia  Anemia of chronic disease -Hemoglobin 7.4 today.  Transfuse if hemoglobin is less than 7.  Has required 1 unit packed red cell transfusion on 04/22/2020  Generalized conditioning -Overall prognosis is very poor.   -Palliative care follow-up appreciated.  Patient remains full code.  Stage II coccygeal pressure injury present on admission -Continue local wound care.   DVT prophylaxis: Heparin drip Code Status: Full Family Communication: None at bedside Disposition Plan: Status is: Inpatient  Remains inpatient appropriate because:Inpatient level of care appropriate due to severity of illness   Dispo: The patient is from: LTAC              Anticipated d/c is to:  LTAC  Patient currently is not medically stable to d/c.   Difficult to place patient No  Consultants: PCCM/nephrology/palliative care  Procedures: None  Antimicrobials:  Anti-infectives (From admission, onward)   Start     Dose/Rate Route Frequency Ordered Stop   04/19/20 1115  ceFEPIme (MAXIPIME) 2 g in sodium chloride 0.9 % 100 mL IVPB        2 g 200 mL/hr over 30 Minutes Intravenous Every 24 hours 04/18/20 1503 04/20/20 1051   04/19/20 1102  ceFEPIme (MAXIPIME) 2 g in sodium chloride 0.9 % 100 mL IVPB  Status:  Discontinued        2 g 200 mL/hr over 30 Minutes Intravenous Every 24 hours 04/18/20 1501 04/18/20 1503   04/17/20 1100  linezolid (ZYVOX) IVPB 600 mg        600 mg 300 mL/hr over 60 Minutes Intravenous Every 12 hours 04/17/20 1002 04/20/20 0032   04/16/20 1400  vancomycin (VANCOREADY) IVPB 1250 mg/250 mL  Status:  Discontinued        1,250 mg 166.7 mL/hr over 90 Minutes Intravenous Every 24 hours 04/15/20 1436 04/17/20 0813   04/16/20 0600  ceFEPIme (MAXIPIME) 2 g in sodium chloride 0.9 % 100 mL IVPB  Status:  Discontinued        2 g 200 mL/hr over 30 Minutes Intravenous Every 12 hours 04/15/20 2350 04/18/20 1501   04/16/20 0000  azithromycin (ZITHROMAX) 500 mg in sodium chloride 0.9 % 250 mL IVPB        500 mg 250 mL/hr over 60 Minutes Intravenous Every 24 hours 04/15/20 2350 04/20/20 0100   04/15/20 1700  piperacillin-tazobactam (ZOSYN) IVPB 3.375 g        3.375 g 12.5 mL/hr over 240 Minutes Intravenous  Once 04/15/20 1646 04/15/20 2101   04/15/20 1315  vancomycin (VANCOREADY) IVPB 1500 mg/300 mL        1,500 mg 150 mL/hr over 120 Minutes Intravenous  Once 04/15/20 1305 04/15/20 1617       Subjective: Patient seen and examined at bedside.  Nursing staff reports episodes of hypoglycemia.  No overnight fever, vomiting or seizures reported. Objective: Vitals:   04/29/20 0500 04/29/20 0600 04/29/20 0700 04/29/20 0741  BP: 112/71 127/89 131/86    Pulse: 97 94 96   Resp: (!) 25 (!) 25 (!) 22   Temp:    98.8 F (37.1 C)  TempSrc:    Oral  SpO2: 100% 94% 95%   Weight: 94.7 kg     Height:        Intake/Output Summary (Last 24 hours) at 04/29/2020 0820 Last data filed at 04/29/2020 0700 Gross per 24 hour  Intake 1026 ml  Output 1050 ml  Net -24 ml   Filed Weights   04/27/20 0258 04/28/20 0429 04/29/20 0500  Weight: 91.2 kg 92.8 kg 94.7 kg    Examination:  General exam: Chronically ill looking female lying in bed.  Does not follow commands.  No acute distress. ENT: Tracheostomy and is on a ventilator via trach respiratory system: Bilateral decreased breath sounds at bases with scattered crackles, tachypneic  cardiovascular system: S1-S2 heard, intermittently tachycardic Gastrointestinal system: Abdomen is distended slightly, soft and nontender.  Bowel sounds heard.  PEG tube present. Extremities: No cyanosis; trace lower extremity edema present on the left side; right leg amputation present Central nervous system: Unresponsive/does not follow commands.  Right upper extremity contracture present.   Skin: No obvious ecchymosis/rashes psychiatry: Could not be assessed because of mental status  Data Reviewed: I have personally reviewed following labs and imaging studies  CBC: Recent Labs  Lab 04/25/20 0522 04/26/20 0159 04/27/20 0602 04/28/20 0130 04/29/20 0320  WBC 8.2 8.4 9.3 7.9 7.7  HGB 7.6* 7.0* 7.6* 7.1* 7.4*  HCT 25.3* 21.7* 24.3* 23.5* 23.5*  MCV 94.8 91.6 92.0 95.1 92.9  PLT 157 162 186 189 299   Basic Metabolic Panel: Recent Labs  Lab 04/23/20 0351 04/24/20 0401 04/24/20 0904 04/26/20 0159 04/27/20 0602 04/28/20 0130 04/28/20 0459 04/28/20 0720 04/28/20 1718 04/28/20 1918 04/28/20 2317 04/29/20 0320 04/29/20 0726  NA 136 133*   < > 135 134* 135 134*  --   --  135  --  136  --   K 4.9 5.8*   < > 4.9 5.9* 6.2* 6.0*   < > >7.5* 5.8* 5.4* 5.3* 5.3*  CL 101 101   < > 103 100 101 100  --   --   101  --  99  --   CO2 24 24   < > 23 22 21* 21*  --   --  23  --  22  --   GLUCOSE 203* 248*   < > 151* 145* 145* 137*  --   --  107*  --  73  --   BUN 78* 86*   < > 102* 109* 112* 113*  --   --  115*  --  116*  --   CREATININE 4.17* 4.21*   < > 4.64* 4.83* 4.91* 4.94*  --   --  4.90*  --  5.00*  --   CALCIUM 8.1* 8.2*   < > 8.5* 8.7* 8.9 9.0  --   --  9.1  --  9.2  --   MG  --   --   --  1.8 1.9  --   --   --   --   --   --   --   --   PHOS 6.0* 6.6*  --   --   --   --   --   --   --   --   --   --   --    < > = values in this interval not displayed.   GFR: Estimated Creatinine Clearance: 11.1 mL/min (A) (by C-G formula based on SCr of 5 mg/dL (H)). Liver Function Tests: Recent Labs  Lab 04/23/20 0351 04/24/20 0401  ALBUMIN 2.0* 1.9*   No results for input(s): LIPASE, AMYLASE in the last 168 hours. No results for input(s): AMMONIA in the last 168 hours. Coagulation Profile: No results for input(s): INR, PROTIME in the last 168 hours. Cardiac Enzymes: No results for input(s): CKTOTAL, CKMB, CKMBINDEX, TROPONINI in the last 168 hours. BNP (last 3 results) No results for input(s): PROBNP in the last 8760 hours. HbA1C: No results for input(s): HGBA1C in the last 72 hours. CBG: Recent Labs  Lab 04/29/20 0056 04/29/20 0114 04/29/20 0515 04/29/20 0535 04/29/20 0743  GLUCAP 41* 150* 48* 112* 66*   Lipid Profile: No results for input(s): CHOL, HDL, LDLCALC, TRIG, CHOLHDL, LDLDIRECT in the last 72 hours. Thyroid Function Tests: No results for input(s): TSH, T4TOTAL, FREET4, T3FREE, THYROIDAB in the last 72 hours. Anemia Panel: No results for input(s): VITAMINB12, FOLATE, FERRITIN, TIBC, IRON, RETICCTPCT in the last 72 hours. Sepsis Labs: No results for input(s): PROCALCITON, LATICACIDVEN in the last 168 hours.  Recent Results (from the past 240 hour(s))  Culture, Respiratory w Gram Stain  Status: None   Collection Time: 04/19/20  5:38 PM   Specimen: Tracheal Aspirate;  Respiratory  Result Value Ref Range Status   Specimen Description TRACHEAL ASPIRATE  Final   Special Requests NONE  Final   Gram Stain   Final    MODERATE WBC PRESENT,BOTH PMN AND MONONUCLEAR NO ORGANISMS SEEN Performed at [facility], [address] [place], [place] [address]    Culture RARE PSEUDOMONAS AERUGINOSA  Final   Report Status 04/23/2020 FINAL  Final   Organism ID, Bacteria PSEUDOMONAS AERUGINOSA  Final      Susceptibility   Pseudomonas aeruginosa - MIC*    CEFTAZIDIME >=64 RESISTANT Resistant     CIPROFLOXACIN <=0.25 SENSITIVE Sensitive     GENTAMICIN <=1 SENSITIVE Sensitive     IMIPENEM >=16 RESISTANT Resistant     * RARE PSEUDOMONAS AERUGINOSA         Radiology Studies: No results found.      Scheduled Meds: . [START ON 04/30/2020] amiodarone  200 mg Per Tube Daily  . amiodarone  400 mg Per Tube BID  . chlorhexidine gluconate (MEDLINE KIT)  15 mL Mouth Rinse BID  . Chlorhexidine Gluconate Cloth  6 each Topical Daily  . feeding supplement (PROSource TF)  45 mL Per Tube Daily  . fiber  1 packet Per Tube BID  . insulin aspart  0-15 Units Subcutaneous Q4H  . insulin detemir  25 Units Subcutaneous BID  . levETIRAcetam  500 mg Per Tube QHS  . mouth rinse  15 mL Mouth Rinse 10 times per day  . midodrine  10 mg Per Tube Q8H  . pantoprazole sodium  40 mg Per Tube Q24H  . QUEtiapine  25 mg Per Tube QHS  . sodium chloride flush  10-40 mL Intracatheter Q12H  . sodium zirconium cyclosilicate  10 g Per Tube TID   Continuous Infusions: . sodium chloride    . feeding supplement (NEPRO CARB STEADY) Stopped (04/28/20 1807)  . furosemide Stopped (04/28/20 1937)  . heparin 1,300 Units/hr (04/29/20 0700)          Aline August, MD [facility] 04/29/2020, 8:20 AM

## 2020-04-29 NOTE — Progress Notes (Signed)
Palliative Care Progress Note  Reason for consult: Goals of care  I saw and examined Rebecca Harrell again today.  She remains on full support for mechanical ventilation.  Eyes are open and she is awake.  She attempts to nod to simple questions, however, answers or not really consistently reliable and she is not able to effectively express enough to consider that she may be able to participate in conversation regarding her own goals of care.  I called and was able to reach patient's daughter, Rebecca Harrell, via phone.  She reports last talking to a doctor, "a couple of days ago" and reports she is most for update from bedside nursing staff.  When asked about her understanding of the current situation, she expressed knowing that her mother's kidneys were not doing well but noted that she feels they are going to "stabilize."  We then began discussion regarding Rebecca Harrell's situation with progressive renal failure with decreased urine output, increasing creatinine, and increasing potassium.  At this point, she stated that she wanted to add her brothers in to have a group conversation.  She called and I was able to have a conference call with Rebecca Harrell, and Rebecca Harrell.  I reviewed broadly her clinical course this admission.  We specifically focused on events of last few days with her continued ventilator dependence and progression of renal failure.  I expressed concern that what we are seeing are signs that her body is shutting down and that this is unlikely to be a reversible process.  We discussed that she is not a good candidate for dialysis and if her kidneys continue to shut down, there is not an intervention that is going to "fix" her issues and she is likely to die in the near future regardless of interventions moving forward.  I then expressed concern that it would not be medically appropriate or indicated to plan for aggressive intervention such as CPR when the time comes her heart naturally stops from  irreversible illness.  Discussed reasoning that CPR would not fix the underlying insult that caused cardiac arrest and therefore the harms of CPR would outweigh the benefits.  I asked about their understanding of this and her daughter expressed, "I disagree with that. I don't understand why you would not at least try."  I attempted to explore further their understanding and hopes tied to request for CPR, but was not really able to discern the underlying issue.  Family then wanted to know more about dialysis and why she is not a candidate.  We discussed both short and long-term dialysis.  I explained that she was not a good candidate for long-term dialysis due to her overall poor functional/medical status at baseline.  We also discussed short-term trial of dialysis and how this was unlikely to result in any meaningful change in her outcome long-term.  Her daughter then expressed that if dialysis is an option in any way, shape, or form, that they believe this is something she would want.  When she asked for opinion of her brothers, they agreed this was the case.  Overall, conversation resulted in family requesting dialysis if it is something that is going to be offered.  If not, family desires more information of why she is not a good candidate and if she could get it in a different care environment.  I called and relayed the above conversation to Rebecca Harrell.  We discussed plan to reach out to nephrology for further input tomorrow.  Depending on input from  nephrology, we may need to involve ethics if there is still disconnect between family desires for care and what is determined to be appropriate interventions from her care team.  Start time: 1510 End time: 1615 Total time: 55 minutes  Greater than 50%  of this time was spent counseling and coordinating care related to the above assessment and plan.  Rebecca Rough, MD [facility] [phone]

## 2020-04-30 DIAGNOSIS — J9621 Acute and chronic respiratory failure with hypoxia: Secondary | ICD-10-CM | POA: Diagnosis not present

## 2020-04-30 DIAGNOSIS — G40909 Epilepsy, unspecified, not intractable, without status epilepticus: Secondary | ICD-10-CM | POA: Diagnosis not present

## 2020-04-30 DIAGNOSIS — Z930 Tracheostomy status: Secondary | ICD-10-CM | POA: Diagnosis not present

## 2020-04-30 DIAGNOSIS — N179 Acute kidney failure, unspecified: Secondary | ICD-10-CM | POA: Diagnosis not present

## 2020-04-30 DIAGNOSIS — I4891 Unspecified atrial fibrillation: Secondary | ICD-10-CM | POA: Diagnosis not present

## 2020-04-30 LAB — CBC
HCT: 24.7 % — ABNORMAL LOW (ref 36.0–46.0)
Hemoglobin: 7.6 g/dL — ABNORMAL LOW (ref 12.0–15.0)
MCH: 29.2 pg (ref 26.0–34.0)
MCHC: 30.8 g/dL (ref 30.0–36.0)
MCV: 95 fL (ref 80.0–100.0)
Platelets: 170 10*3/uL (ref 150–400)
RBC: 2.6 MIL/uL — ABNORMAL LOW (ref 3.87–5.11)
RDW: 17.7 % — ABNORMAL HIGH (ref 11.5–15.5)
WBC: 7.5 10*3/uL (ref 4.0–10.5)
nRBC: 0 % (ref 0.0–0.2)

## 2020-04-30 LAB — GLUCOSE, CAPILLARY
Glucose-Capillary: 116 mg/dL — ABNORMAL HIGH (ref 70–99)
Glucose-Capillary: 130 mg/dL — ABNORMAL HIGH (ref 70–99)
Glucose-Capillary: 149 mg/dL — ABNORMAL HIGH (ref 70–99)
Glucose-Capillary: 165 mg/dL — ABNORMAL HIGH (ref 70–99)
Glucose-Capillary: 165 mg/dL — ABNORMAL HIGH (ref 70–99)
Glucose-Capillary: 178 mg/dL — ABNORMAL HIGH (ref 70–99)
Glucose-Capillary: 98 mg/dL (ref 70–99)

## 2020-04-30 LAB — BASIC METABOLIC PANEL
Anion gap: 14 (ref 5–15)
BUN: 117 mg/dL — ABNORMAL HIGH (ref 8–23)
CO2: 21 mmol/L — ABNORMAL LOW (ref 22–32)
Calcium: 8.9 mg/dL (ref 8.9–10.3)
Chloride: 100 mmol/L (ref 98–111)
Creatinine, Ser: 4.98 mg/dL — ABNORMAL HIGH (ref 0.44–1.00)
GFR, Estimated: 8 mL/min — ABNORMAL LOW (ref >60)
Glucose, Bld: 119 mg/dL — ABNORMAL HIGH (ref 70–99)
Potassium: 5 mmol/L (ref 3.5–5.1)
Sodium: 135 mmol/L (ref 135–145)

## 2020-04-30 LAB — HEPARIN LEVEL (UNFRACTIONATED): Heparin Unfractionated: 0.37 IU/mL (ref 0.30–0.70)

## 2020-04-30 MED ORDER — APIXABAN 5 MG PO TABS
5.0000 mg | ORAL_TABLET | Freq: Two times a day (BID) | ORAL | Status: DC
  Administered 2020-04-30 – 2020-05-01 (×4): 5 mg via ORAL
  Filled 2020-04-30 (×4): qty 1

## 2020-04-30 NOTE — Progress Notes (Signed)
[facility] NEPHROLOGY PROGRESS NOTE  Assessment/ Plan: Pt is a 80 y.o. yo female  with PMH of DM, HFpEF, CVA with right-sided hemiparesis/contracture, s/p right BKA, PEG tube, chronic respiratory failure status post tracheostomy, she was recently at [facility] where treated for HCAP developed A. fib with RVR, hypotension and was sent to ER.  Acute kidney injury: Cr 1 as of 04/15/20 now with severe AKI. Suspect hemodynamically mediated AKI in the setting of A. fib with RVR, IV contrast, recent infection.  UA without proteinuria or microscopic hematuria.  Kidney ultrasound ruled out obstruction.  Not a candidate for long term dialysis in light of her chronic limited function status, comorbidities as well detailed in 04/29/20 note by Dr. Candiss Norse.  - Thankfully there are no indications and her UOP has been increasing which hopefully indicates renal recovery.  She is awake and alert with acceptable electrolytes and acid/base.  -daily labs, monitor strict I/O please  Hyperkalemia, improved: stopped osmolite TF's, switched to nepro. Supportive care at the moment. C/w Lasix 152m BID, c/w lokelma 10g PRN, shifting agents prn.  K was 5 this AM.   Hypotension, h/o HTN - on midodrine  Acute on chronic hypoxic respiratory failure, h/o trach: Currently on vent in ICU.  PCCM is following.  Severe sepsis/Healthcare associated pneumonia/septic shock: Treated with broad-spectrum antibiotics per primary team.  Off pressors, on midodrine  A. fib with RVR: On Eliquis, diltiazem.  Nutrition: She has hypoalbuminemia. Supplement per RD.  Subjective: No acute events. I/Os 1.8 / 1.3.   Objective Vital signs in last 24 hours: Vitals:   04/30/20 0500 04/30/20 0700 04/30/20 0730 04/30/20 0738  BP: 121/74 106/69 106/69   Pulse: 90 89 88   Resp: (!) 25 (!) 25 (!) 25   Temp:    97.9 F (36.6 C)  TempSrc:    Axillary  SpO2: 100% 100% 100%   Weight:      Height:       Weight change: -4.2  kg  Intake/Output Summary (Last 24 hours) at 04/30/2020 1000 Last data filed at 04/30/2020 09604Gross per 24 hour  Intake 1713.57 ml  Output 1000 ml  Net 713.57 ml       Labs: Basic Metabolic Panel: Recent Labs  Lab 04/24/20 0401 04/24/20 0904 04/28/20 1918 04/28/20 2317 04/29/20 0320 04/29/20 0726 04/29/20 1113 04/30/20 0222  NA 133*   < > 135  --  136  --   --  135  K 5.8*   < > 5.8*   < > 5.3* 5.3* 4.9 5.0  CL 101   < > 101  --  99  --   --  100  CO2 24   < > 23  --  22  --   --  21*  GLUCOSE 248*   < > 107*  --  73  --   --  119*  BUN 86*   < > 115*  --  116*  --   --  117*  CREATININE 4.21*   < > 4.90*  --  5.00*  --   --  4.98*  CALCIUM 8.2*   < > 9.1  --  9.2  --   --  8.9  PHOS 6.6*  --   --   --   --   --   --   --    < > = values in this interval not displayed.   Liver Function Tests: Recent Labs  Lab 04/24/20 0401  ALBUMIN 1.9*   No results for input(s): LIPASE, AMYLASE in the last 168 hours. No results for input(s): AMMONIA in the last 168 hours. CBC: Recent Labs  Lab 04/26/20 0159 04/27/20 0602 04/28/20 0130 04/29/20 0320 04/30/20 0222  WBC 8.4 9.3 7.9 7.7 7.5  HGB 7.0* 7.6* 7.1* 7.4* 7.6*  HCT 21.7* 24.3* 23.5* 23.5* 24.7*  MCV 91.6 92.0 95.1 92.9 95.0  PLT 162 186 189 172 170   Cardiac Enzymes: No results for input(s): CKTOTAL, CKMB, CKMBINDEX, TROPONINI in the last 168 hours. CBG: Recent Labs  Lab 04/29/20 1922 04/29/20 2131 04/30/20 0010 04/30/20 0332 04/30/20 0736  GLUCAP 68* 76 98 130* 116*    Iron Studies:  No results for input(s): IRON, TIBC, TRANSFERRIN, FERRITIN in the last 72 hours. Studies/Results: No results found.  Medications: Infusions: . sodium chloride    . feeding supplement (VITAL AF 1.2 CAL) 1,000 mL (04/29/20 1758)  . furosemide 120 mg (04/30/20 0920)  . heparin 1,300 Units/hr (04/30/20 0600)    Scheduled Medications: . amiodarone  200 mg Per Tube Daily  . chlorhexidine gluconate (MEDLINE KIT)  15  mL Mouth Rinse BID  . Chlorhexidine Gluconate Cloth  6 each Topical Daily  . feeding supplement (PROSource TF)  45 mL Per Tube Daily  . fiber  1 packet Per Tube BID  . free water  30 mL Per Tube Q4H  . insulin aspart  0-15 Units Subcutaneous Q4H  . levETIRAcetam  500 mg Per Tube QHS  . mouth rinse  15 mL Mouth Rinse 10 times per day  . midodrine  5 mg Per Tube Q8H  . nutrition supplement (JUVEN)  1 packet Per Tube BID BM  . pantoprazole sodium  40 mg Per Tube Q24H  . QUEtiapine  25 mg Per Tube QHS  . sodium chloride flush  10-40 mL Intracatheter Q12H    have reviewed scheduled and prn medications.  Physical Exam: General: Chronically ill looking female, on mechanical ventilation  Neck: +trach Heart:RRR, s1s2 nl Lungs: coarse BL, on vent via trach Abdomen:soft,  non-distended Extremities: mild anasarca, Contracture of upper extremities, right AKA. Neurology:  Following commands, mouthing yes or no and shaking her head, awake and responsive    A  04/30/2020,10:00 AM  LOS: 15 days   

## 2020-04-30 NOTE — Ethics Note (Addendum)
Ethics Consult Note  Initial contact w/ medical team 04/29/20, which was on patient's hospital day 14   Source of Consult: Dr. [name], Palliative care Current attending physician/service: Aline August, MD  Reason(s) for consult and ethical question(s): Is this patient a candidate for Long-term HD [Hemodialysis]? Is there a benefit to trial of short-term iHD [intermittent Hemodialysis]?   Information-gathering: Discussion with source of consult in detail Dr. Domingo Cocking on phone Chart reviewed   80 y/o Fem-multiple co-morbid's inc Prior resident [facility] ~15 yrs after large CVA--was admit to [facility] ~ 3/ early March and d/c to [facility] Chr Re sp Failure, Chr aspiration,  Trach +, PEG tube + Persistent apparent vegetative state Recent [facility] admission--there developed NEW Afib /RVR CHad2Vasc2=7--Hypotension--admit 3/13 by [facility]-- >CCM-->back to [facility] 3/23 [palliative consulted by CCM on 3/18]   Please see paraphrased discussion as below as per Dr. Domingo Cocking note 3/27  "We discussed Ms. Gully's clinical course.  We discussed that she is not a candidate for long-term dialysis and concern surrounding question if she would benefit from any sort of short-term dialysis.  We also discussed CODE STATUS and appropriateness of attempting resuscitation in light of her progressive renal failure.   Dr. Verlon Au reached out to myself, Dr. Starla Link, and Dr. Candiss Norse to guide decision making regarding potential next steps for the care of Ms. Lotspeich.  We discussed that certain interventions (such as dialysis and attempts at CPR in the event of cardiac arrest secondary to progressive renal failure) may fall under futile care and if so would not be required to be offered interventions.   Dr. Candiss Norse reached out to family to continue attempts at joint decision-making regarding care for Ms. Riccelli moving forward.  Please see outline of his conversation regarding potential burdens of dialysis and his  encounter with Ms. Willsey where she seemed to indicate this is not something that she would want.   I saw and examined Ms. Harbach again today.  She remains on full support for mechanical ventilation.  Her eyes are open and she is awake.  She is interactive and nods to simple questions.     I called and read the patient's daughter, [name], via phone.  We also called and were able to have a conference call between myself, Henriette Combs, and Catalina [place].   [name] outlined her conversation with Dr. Candiss Norse and expressed that she needs to be able to come to the hospital to see her mother to determine if she understands about dialysis and what her wishes would be.  Her children all agree this would be the first step moving forward as they would want to follow Ms. Shepherd desires for her care moving forward.  If she does not want dialysis, family states that would be accepting of this if she is the one making that decision.   I then talked with family about, even if Ms. Adams desires dialysis, she is not a candidate for long-term dialysis and, in my opinion, short-term dialysis is not likely to be beneficial either unless it is likely that there is real potential for renal recovery in a very short period of time..  We discussed that even if family desire short-term dialysis, it would not be offered as an intervention if is not agreed upon by her care team that there is likely benefit that would outweigh any burdens.   At the end of our conversation, we agreed that [place] will come to determine how much she understands about situation and  if she would even want trial of dialysis (assuming she would be a candidate for short term trial) and I will call to discuss with rest of her care team regarding what medical interventions are appropriate to consider moving forward.  Patient's Personal/Social Facts:  Eulis Manly [son] appears to be primary decision maker Unclear if HCPOA on file  legally   Recommendations:  1) suggest ongoing discussions with [name] /Irvin and or determine Marlin Canary can help with HCPOA paperwork if none present--I have placed non-emergent consult in Epic for chaplain to discuss this with family and asked that [place] apprise Attending MD of who will be the HCPOA] to best delineate who represents the patient's interests. 2) suggest "care team consult"--suggest sitting down as a care team with Primary Dr. Starla Link, Nephrologist [currently Dr Johnney Ou and palliative care team [Dr. freeman?] with HCPOA and there family member to facilitate clear face-to-face communication regarding gravity of medical decision making for Mrs. Nesmith.  Please consider involving CMO Dr. Hulen Skains in these conversations as the thread of continuity that ties Mrs. Sock and her family to a visible authority figure in the hospital 3) finally-suggest continuation of good communication with family regarding high medical risks of  . chronic aspiration  . Afib RVR with high [place] score . Risk of HD access placement, risk of complications from sedation risk of bleeding . Risk of iHD and intermittent hypotension in a patient with current co-morbid hypotension . Overall failure to thrive in setting of recurrent bouts of aspiration [2 in the past 3 months]superimposed of large CVA with poor viable meaningful objective functional outcomes 4) failure of # 3, after careful consideration and collaboration with family might warrant an introduction of the [place] futility policy as per below      . If earnest, good-faith attempts have been made to achieve shared decision-making between the medical team and the patient's family/surrogate decision-maker(s), but shared decision-making is not possible (family/surrogate still disagree with medical recommendations), then the attending physician (in cooperation with at least one other physician consultant) is ethically justified in withdrawing or in not  offering treatments IF those physicians are convinced that such measures are futile, i.e. will not improve patient's QOL, will serve no medical benefit, or may in fact cause harm.  .  If the family/surrogate(s) insists upon treatments which the medical team considers futile, they have the right to attempt transfer to a facility that agrees to provide those treatments. (Of note, there is no policy-directed time frame for transfer, but it is advised to set clear expectations as much as possible. Family ought to be making demonstrable efforts at transfer if transfer is desired. It is reasonable for attending physicians to use their best judgment to determine deadlines for transfer, or deadlines for withdrawal of life support if transfer is not possible. It is reasonable to keep patients on life support for a limited time to allow family to gather at bedside prior to withdrawal of life support.)     Thank you for this consult. Let me know how else I can assist?  Please utilize the pager number to contact me.  Secure message on Epic is also welcome but may not receive an immediate response.    Verneita Griffes, MD Ethics Committee  75 minutes in case review, co-ordination with care-team and up-to date chart review

## 2020-04-30 NOTE — Progress Notes (Signed)
Patient ID: Rebecca Harrell, female   DOB: [birth year], 80 y.o.   MRN: [id number]  PROGRESS NOTE    Rebecca Harrell  QHU:[id number] DOB: 22-May-1940 DOA: 04/15/2020 PCP: Aaron Edelman, MD   Brief Narrative:  80 year old female with history of chronic hypoxic respiratory failure with tracheostomy and PEG tube dependence, aspiration, hypertension, diabetes mellitus type 2, chronic diastolic CHF, seizure disorder, unspecified CVA with right-sided hemiparesis, chronic vegetative state, paroxysmal A. fib recently taken off anticoagulation, pneumonia receiving antibiotic treatment at [facility] prior to presentation presents with A. fib with RVR.  Chest x-ray was concerning for possibility partially loculated effusion.  CT was negative for PE pulmonary and cardiology were consulted.  Patient had worsening hypoxia and care was taken over by [facility] team.  She was treated with broad-spectrum antibiotics.  Sputum culture grew Pseudomonas aeruginosa.  Patient was treated with heparin drip.  She required 1 unit packed red cell transfusion on 04/22/2020.  She has had issues with hypotension requiring intermittent pressors and subsequently starting midodrine.  Lurline Idol was changed on 04/24/2020 due to leak.  Palliative care also has been consulted.  She was transferred back to [facility] service on 04/25/2020.  Assessment & Plan:   Acute on chronic hypoxic respiratory failure, history of tracheostomy Aspiration pneumonitis -PCCM following and managing tracheostomy and ventilator.  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  Patient was treated with broad-spectrum antibiotics.  Antibiotics subsequently discontinued 04/20/2020 by PCCM.  Defer further antibiotics unless she develops new infiltrates on chest x-ray or spikes fever -Pulmonary/bronchial hygiene -No temperature spikes over the last 24 hours.  Chronic hypotension -Blood pressure currently improved.   Continue midodrine 5 mg 3 times a day for now.  Pressors have been weaned off.  Continuous fentanyl has been discontinued.  Paroxysmal A. fib with RVR -Not on anticoagulation as an outpatient -Mostly rate controlled currently.  Cardiology has signed off.  Currently on heparin drip and amiodarone via tube  AKI -Possibly from ATN from hypotension and hypoxia -Nephrology following: Follow recommendations.  Creatinine 4.98 today. -Patient is probably not a good candidate for dialysis -Renal ultrasound ruled out obstruction. -Monitor labs  Hyperkalemia -Resolved.  Off Lokelma.  Follow nephrology recommendations.  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  PEG tube feeding was changed on 04/29/2020 by dietitian as the prior PEG tube feeding was probably leading to increased hyperkalemia.  Continue Keppra/Seroquel. -Overall prognosis is very poor.  Palliative care follow-up appreciated.  Patient remains full code. -It seems that her overall care is very futile especially now since the creatinine is trending upwards.  I do not think patient is a candidate for dialysis.  Family should focus on total comfort measures.  Family is not ready for the same.  Ethics committee has been involved.  Follow recommendations.  Diabetes mellitus type II with hypoglycemia -Continue CBGs.  Off Levemir because of hypoglycemia  Anemia of chronic disease -Hemoglobin 7.4 today.  Transfuse if hemoglobin is less than 7.  Has required 1 unit packed red cell transfusion on 04/22/2020  Generalized conditioning -Overall prognosis is very poor.   -Palliative care follow-up appreciated.  Patient remains full code.  Stage II coccygeal pressure injury present on admission -Continue local wound care.   DVT prophylaxis: Heparin drip Code Status: Full Family Communication: None at bedside Disposition Plan: Status is:  Inpatient  Remains inpatient appropriate because:Inpatient level of care appropriate  due to severity of illness   Dispo: The patient is from: LTAC              Anticipated d/c is to: LTAC              Patient currently is not medically stable to d/c.   Difficult to place patient No  Consultants: PCCM/nephrology/palliative care  Procedures: None  Antimicrobials:  Anti-infectives (From admission, onward)   Start     Dose/Rate Route Frequency Ordered Stop   04/19/20 1115  ceFEPIme (MAXIPIME) 2 g in sodium chloride 0.9 % 100 mL IVPB        2 g 200 mL/hr over 30 Minutes Intravenous Every 24 hours 04/18/20 1503 04/20/20 1051   04/19/20 1102  ceFEPIme (MAXIPIME) 2 g in sodium chloride 0.9 % 100 mL IVPB  Status:  Discontinued        2 g 200 mL/hr over 30 Minutes Intravenous Every 24 hours 04/18/20 1501 04/18/20 1503   04/17/20 1100  linezolid (ZYVOX) IVPB 600 mg        600 mg 300 mL/hr over 60 Minutes Intravenous Every 12 hours 04/17/20 1002 04/20/20 0032   04/16/20 1400  vancomycin (VANCOREADY) IVPB 1250 mg/250 mL  Status:  Discontinued        1,250 mg 166.7 mL/hr over 90 Minutes Intravenous Every 24 hours 04/15/20 1436 04/17/20 0813   04/16/20 0600  ceFEPIme (MAXIPIME) 2 g in sodium chloride 0.9 % 100 mL IVPB  Status:  Discontinued        2 g 200 mL/hr over 30 Minutes Intravenous Every 12 hours 04/15/20 2350 04/18/20 1501   04/16/20 0000  azithromycin (ZITHROMAX) 500 mg in sodium chloride 0.9 % 250 mL IVPB        500 mg 250 mL/hr over 60 Minutes Intravenous Every 24 hours 04/15/20 2350 04/20/20 0100   04/15/20 1700  piperacillin-tazobactam (ZOSYN) IVPB 3.375 g        3.375 g 12.5 mL/hr over 240 Minutes Intravenous  Once 04/15/20 1646 04/15/20 2101   04/15/20 1315  vancomycin (VANCOREADY) IVPB 1500 mg/300 mL        1,500 mg 150 mL/hr over 120 Minutes Intravenous  Once 04/15/20 1305 04/15/20 1617       Subjective: Patient seen and examined at bedside.  No overnight fever,  vomiting, seizures reported by nursing staff. Objective: Vitals:   04/30/20 0400 04/30/20 0500 04/30/20 0700 04/30/20 0730  BP: 118/82 121/74 106/69 106/69  Pulse: 92 90 89 88  Resp: (!) 25 (!) 25 (!) 25 (!) 25  Temp: 99 F (37.2 C)     TempSrc: Oral     SpO2: 100% 100% 100% 100%  Weight:      Height:        Intake/Output Summary (Last 24 hours) at 04/30/2020 0733 Last data filed at 04/30/2020 0700 Gross per 24 hour  Intake 1804.48 ml  Output 1500 ml  Net 304.48 ml   Filed Weights   04/28/20 0429 04/29/20 0500 04/30/20 0353  Weight: 92.8 kg 94.7 kg 90.5 kg    Examination:  General exam: Chronically ill looking female lying in bed.  No distress.   ENT: Currently on vent via trach  respiratory system: Decreased breath sounds at bases bilaterally with crackles and tachypnea  cardiovascular system: Currently rate controlled, S1-S2 heard  gastrointestinal system: Abdomen is mildly distended, soft and nontender.  Normal bowel sounds are heard.  PEG tube present. Extremities: Mild lower extremity edema present on left side;  no clubbing; right leg amputation present Central nervous system: Awake, hardly follows any commands.  Right upper extremity contracture present.   Skin: No obvious petechiae/lesions psychiatry: Cannot be assessed because of mental status  Data Reviewed: I have personally reviewed following labs and imaging studies  CBC: Recent Labs  Lab 04/26/20 0159 04/27/20 0602 04/28/20 0130 04/29/20 0320 04/30/20 0222  WBC 8.4 9.3 7.9 7.7 7.5  HGB 7.0* 7.6* 7.1* 7.4* 7.6*  HCT 21.7* 24.3* 23.5* 23.5* 24.7*  MCV 91.6 92.0 95.1 92.9 95.0  PLT 162 186 189 172 213   Basic Metabolic Panel: Recent Labs  Lab 04/24/20 0401 04/24/20 0904 04/26/20 0159 04/27/20 0602 04/28/20 0130 04/28/20 0459 04/28/20 0720 04/28/20 1918 04/28/20 2317 04/29/20 0320 04/29/20 0726 04/29/20 1113 04/30/20 0222  NA 133*   < > 135 134* 135 134*  --  135  --  136  --   --  135  K  5.8*   < > 4.9 5.9* 6.2* 6.0*   < > 5.8* 5.4* 5.3* 5.3* 4.9 5.0  CL 101   < > 103 100 101 100  --  101  --  99  --   --  100  CO2 24   < > 23 22 21* 21*  --  23  --  22  --   --  21*  GLUCOSE 248*   < > 151* 145* 145* 137*  --  107*  --  73  --   --  119*  BUN 86*   < > 102* 109* 112* 113*  --  115*  --  116*  --   --  117*  CREATININE 4.21*   < > 4.64* 4.83* 4.91* 4.94*  --  4.90*  --  5.00*  --   --  4.98*  CALCIUM 8.2*   < > 8.5* 8.7* 8.9 9.0  --  9.1  --  9.2  --   --  8.9  MG  --   --  1.8 1.9  --   --   --   --   --   --   --   --   --   PHOS 6.6*  --   --   --   --   --   --   --   --   --   --   --   --    < > = values in this interval not displayed.   GFR: Estimated Creatinine Clearance: 10.9 mL/min (A) (by C-G formula based on SCr of 4.98 mg/dL (H)). Liver Function Tests: Recent Labs  Lab 04/24/20 0401  ALBUMIN 1.9*   No results for input(s): LIPASE, AMYLASE in the last 168 hours. No results for input(s): AMMONIA in the last 168 hours. Coagulation Profile: No results for input(s): INR, PROTIME in the last 168 hours. Cardiac Enzymes: No results for input(s): CKTOTAL, CKMB, CKMBINDEX, TROPONINI in the last 168 hours. BNP (last 3 results) No results for input(s): PROBNP in the last 8760 hours. HbA1C: No results for input(s): HGBA1C in the last 72 hours. CBG: Recent Labs  Lab 04/29/20 1800 04/29/20 1922 04/29/20 2131 04/30/20 0010 04/30/20 0332  GLUCAP 51* 68* 76 98 130*   Lipid Profile: No results for input(s): CHOL, HDL, LDLCALC, TRIG, CHOLHDL, LDLDIRECT in the last 72 hours. Thyroid Function Tests: No results for input(s): TSH, T4TOTAL, FREET4, T3FREE, THYROIDAB in the last 72 hours. Anemia Panel: No results for input(s): VITAMINB12, FOLATE, FERRITIN, TIBC, IRON, RETICCTPCT  in the last 72 hours. Sepsis Labs: No results for input(s): PROCALCITON, LATICACIDVEN in the last 168 hours.  No results found for this or any previous visit (from the past 240 hour(s)).        Radiology Studies: No results found.      Scheduled Meds: . amiodarone  200 mg Per Tube Daily  . chlorhexidine gluconate (MEDLINE KIT)  15 mL Mouth Rinse BID  . Chlorhexidine Gluconate Cloth  6 each Topical Daily  . feeding supplement (PROSource TF)  45 mL Per Tube Daily  . fiber  1 packet Per Tube BID  . free water  30 mL Per Tube Q4H  . insulin aspart  0-15 Units Subcutaneous Q4H  . levETIRAcetam  500 mg Per Tube QHS  . mouth rinse  15 mL Mouth Rinse 10 times per day  . midodrine  5 mg Per Tube Q8H  . nutrition supplement (JUVEN)  1 packet Per Tube BID BM  . pantoprazole sodium  40 mg Per Tube Q24H  . QUEtiapine  25 mg Per Tube QHS  . sodium chloride flush  10-40 mL Intracatheter Q12H   Continuous Infusions: . sodium chloride    . feeding supplement (VITAL AF 1.2 CAL) 1,000 mL (04/29/20 1758)  . furosemide Stopped (04/29/20 2114)  . heparin 1,300 Units/hr (04/30/20 0600)          Aline August, MD [facility] 04/30/2020, 7:33 AM

## 2020-04-30 NOTE — Plan of Care (Signed)
  Problem: Activity: Goal: Ability to tolerate increased activity will improve Outcome: Progressing   Problem: Cardiac: Goal: Ability to achieve and maintain adequate cardiopulmonary perfusion will improve Outcome: Progressing   

## 2020-04-30 NOTE — Hospital Course (Signed)
Ethics Consult Note  Initial contact w/ medical team 04/29/20, which was on patient's hospital day 14   Source of Consult: Dr. [name], Palliative care Current attending physician/service: Aline August, MD  Reason(s) for consult and ethical question(s): Is this patient a candidate for Long-term HD [Hemodialysis]? Is there a benefit to trial of short-term iHD [intermittent Hemodialysis]?   Information-gathering: Discussion with source of consult in detail Dr. Domingo Cocking on phone Chart reviewed   80 y/o Fem-multiple co-morbids inc Prior resident [facility] ~15 yrs after large CVA--was admit to [facility]; ~ 3/ ealry March and d/c to [facility] Chr Resp Failure, Chr aspiration,  Trach +, PEG tube + Persistent apparent vegetative state Recent [facility] admission--there developed NEW Afib /RVR CHad2Vasc2=7--Hypotension--admit 3/13 byTR-- >CCM-->back to [facility] 3/23 [palliative consulted by CCM on 3/18]   Please see paraphrased discussion as below as per Dr. Domingo Cocking note 3/27  "We discussed Ms. Evetts's clinical course.  We discussed that she is not a candidate for long-term dialysis and concern surrounding question if she would benefit from any sort of short-term dialysis.  We also discussed CODE STATUS and appropriateness of attempting resuscitation in light of her progressive renal failure.   Dr. Verlon Au reached out to myself, Dr. Starla Link, and Dr. Candiss Norse to guide decision making regarding potential next steps for the care of Ms. Ditomaso.  We discussed that certain interventions (such as dialysis and attempts at CPR in the event of cardiac arrest secondary to progressive renal failure) may fall under futile care and if so would not be required to be offered interventions.   Dr. Candiss Norse reached out to family to continue attempts at joint decision-making regarding care for Ms. Stracener moving forward.  Please see outline of his conversation regarding potential burdens of dialysis and his encounter  with Ms. Shaff where she seemed to indicate this is not something that she would want.   I saw and examined Ms. Pinault again today.  She remains on full support for mechanical ventilation.  Her eyes are open and she is awake.  She is interactive and nods to simple questions.     I called and read the patient's daughter, [name], via phone.  We also called and were able to have a conference call between myself, Henriette Combs, and [place].   [name] outlined her conversation with Dr. Candiss Norse and expressed that she needs to be able to come to the hospital to see her mother to determine if she understands about dialysis and what her wishes would be.  Her children all agree this would be the first step moving forward as they would want to follow Ms. Hinck desires for her care moving forward.  If she does not want dialysis, family states that would be accepting of this if she is the one making that decision.   I then talked with family about, even if Ms. Houze desires dialysis, she is not a candidate for long-term dialysis and, in my opinion, short-term dialysis is not likely to be beneficial either unless it is likely that there is real potential for renal recovery in a very short period of time..  We discussed that even if family desire short-term dialysis, it would not be offered as an intervention if is not agreed upon by her care team that there is likely benefit that would outweigh any burdens.   At the end of our conversation, we agreed that [place] will come to determine how much she understands about situation and if she  would even want trial of dialysis (assuming she would be a candidate for short term trial) and I will call to discuss with rest of her care team regarding what medical interventions are appropriate to consider moving forward.  Patient's Personal/Social Facts:  Eulis Manly [son] appears to be primary decision maker Unclear if HCPOA on file legally   Recommendations:  1)  suggest ongoing discussions with [name] /Irvin and or determine Marlin Canary can help with HCPOA paperwork if none present] to best delineate whom represents the patients interests. 2) suggest "care team consult"--suggest sitting down as a care team with Primary Dr. Starla Link, Nephrologist [currently Dr Johnney Ou and palliative care team [Dr. freeman?] with HCPOA and there family member to facilitate clear face-to-face communication regarding gravity of medical decision makoing for Mrs. Cashion.  Please consider involving CMO Dr. Hulen Skains in these conversations as the thread of continuity that ties Mrs. Tout and her family to a visible authority figure in the hospital 3) finally-suggest continuation of good communication with family regarding high medical risks of  chronic aspiration  Afib RVR with high [place] score Risk of HD access placement, risk of complications from sedation risk of bleeding Risk of iHD and intermittent hypotension in a patient with current co-morbid hypotension Overall failure to thrive in setting of recurrent bouts of aspiration [2 in the past 3 months]superimposed of large CVA with poor viable meaningful objective functional outcomes 4) failure of # 3, after careful consideration and collaboration with family might warrant an introduction of the [place] futility policy as per below      If earnest, good-faith attempts have been made to achieve shared decision-making between the medical team and the patient's family/surrogate decision-maker(s), but shared decision-making is not possible (family/surrogate still disagree with medical recommendations), then the attending physician (in cooperation with at least one other physician consultant) is ethically justified in withdrawing or in not offering treatments IF those physicians are convinced that such measures are futile, i.e. will not improve patient's QOL, will serve no medical benefit, or may in fact cause harm.   If the  family/surrogate(s) insists upon treatments which the medical team considers futile, they have the right to attempt transfer to a facility that agrees to provide those treatments. (Of note, there is no policy-directed time frame for transfer, but it is advised to set clear expectations as much as possible. Family ought to be making demonstrable efforts at transfer if transfer is desired. It is reasonable for attending physicians to use their best judgment to determine deadlines for transfer, or deadlines for withdrawal of life support if transfer is not possible. It is reasonable to keep patients on life support for a limited time to allow family to gather at bedside prior to withdrawal of life support.)     Thank you for this consult. Let me know how else I can assist?  Please utilize the pager number to contact me.  Secure message on Epic is also welcome but may not receive an immediate response.    Verneita Griffes, MD Ethics Committee  75 minutes in case review, co-ordination with care-team and up-to date cahrt review

## 2020-04-30 NOTE — Progress Notes (Signed)
ETHICS CONSULT  Long discussions with physicians involved in this patients care (Dr. Domingo Cocking, Dr. Starla Link and Dr. Candiss Norse) on 3.22.2022  Full note to follow.  [place]

## 2020-04-30 NOTE — Progress Notes (Addendum)
[place] for IV Heparin Indication: atrial fibrillation  Allergies  Allergen Reactions  . Quinolones     UNK reaction    Patient Measurements: Height: '5\' 7"'$  (170.2 cm) Weight: 90.5 kg (199 lb 8.3 oz) IBW/kg (Calculated) : 61.6 Heparin Dosing Weight: 78 kg  Vital Signs: Temp: 97.9 F (36.6 C) (03/28 0738) Temp Source: Axillary (03/28 0738) BP: 106/69 (03/28 0730) Pulse Rate: 88 (03/28 0730)  Labs: Recent Labs    04/28/20 0130 04/28/20 0459 04/28/20 1918 04/29/20 0320 04/30/20 0222  HGB 7.1*  --   --  7.4* 7.6*  HCT 23.5*  --   --  23.5* 24.7*  PLT 189  --   --  172 170  HEPARINUNFRC 0.23*  --  0.40 0.35 0.37  CREATININE 4.91*   < > 4.90* 5.00* 4.98*   < > = values in this interval not displayed.    Estimated Creatinine Clearance: 10.9 mL/min (A) (by C-G formula based on SCr of 4.98 mg/dL (H)).  Assessment: 80 years of age female on Eliquis PTA for Afib who is in acute renal failure and minimal to no urine output and Eliquis was held after discussion with Dr. Gasper Sells on 3/18 - at that time no plans to start Heparin per that verbal discussion. Last dose of Eliquis was on 3/18 at 09:33 AM. On 3/19, pharmacy consulted by Cardiology to dose IV Heparin.  Heparin level therapeutic; CBC stable, no bleeding reported.  Goal of Therapy:  Heparin level 0.3-0.7 units/ml  Monitor platelets by anticoagulation protocol: Yes   Plan:  Continue heparin gtt 1300 units/hr Daily heparin level and CBC Monitor closely for s/sx of bleeding  Tequita Marrs D. Mina Marble, PharmD, BCPS, [place] 04/30/2020, 8:24 AM  ======================================  Addendum: No plan for HD/catheter placement Age < 80, weight > 60kg, appropriate for full Eliquis dosing Transition patient back on Eliquis '5mg'$  PO BID D/C IV heparin Pharmacy will sign off and follow peripherally.  Thank you for the consult!  Kinlee Garrison D. Mina Marble, PharmD, BCPS, [place] 04/30/2020, 12:37 PM

## 2020-04-30 NOTE — Progress Notes (Signed)
Palliative Care Progress Note  Reason for consult: Goals of care  I called and discussed Ms. Calamari's case with Dr. Verlon Au, who is the on-call consultant for the ethics team.  We discussed Ms. Elizondo's clinical course.  We discussed that she is not a candidate for long-term dialysis and concern surrounding question if she would benefit from any sort of short-term dialysis.  We also discussed CODE STATUS and appropriateness of attempting resuscitation in light of her progressive renal failure.  Dr. Verlon Au reached out to myself, Dr. Starla Link, and Dr. Candiss Norse to guide decision making regarding potential next steps for the care of Ms. Leseman.  We discussed that certain interventions (such as dialysis and attempts at CPR in the event of cardiac arrest secondary to progressive renal failure) may fall under futile care and if so would not be required to be offered interventions.  Dr. Candiss Norse reached out to family to continue attempts at joint decision-making regarding care for Ms. Thore moving forward.  Please see outline of his conversation regarding potential burdens of dialysis and his encounter with Ms. Growney where she seemed to indicate this is not something that she would want.  I saw and examined Ms. Rafael again today.  She remains on full support for mechanical ventilation.  Her eyes are open and she is awake.  She is interactive and nods to simple questions.    I called and read the patient's daughter, [name], via phone.  We also called and were able to have a conference call between myself, Henriette Combs, and [place].  [name] outlined her conversation with Dr. Candiss Norse and expressed that she needs to be able to come to the hospital to see her mother to determine if she understands about dialysis and what her wishes would be.  Her children all agree this would be the first step moving forward as they would want to follow Ms. Borsellino desires for her care moving forward.  If she does not want dialysis, family states that  would be accepting of this if she is the one making that decision.  I then talked with family about, even if Ms. Moorehead desires dialysis, she is not a candidate for long-term dialysis and, in my opinion, short-term dialysis is not likely to be beneficial either unless it is likely that there is real potential for renal recovery in a very short period of time..  We discussed that even if family desire short-term dialysis, it would not be offered as an intervention if is not agreed upon by her care team that there is likely benefit that would outweigh any burdens.  At the end of our conversation, we agreed that [place] will come to determine how much she understands about situation and if she would even want trial of dialysis (assuming she would be a candidate for short term trial) and I will call to discuss with rest of her care team regarding what medical interventions are appropriate to consider moving forward.  Start time: 1730 End time: 1840 Total time: 50 minutes  Greater than 50%  of this time was spent counseling and coordinating care related to the above assessment and plan.  [name], MD [place] Team [phone]

## 2020-05-01 DIAGNOSIS — Z7189 Other specified counseling: Secondary | ICD-10-CM | POA: Diagnosis not present

## 2020-05-01 DIAGNOSIS — J9601 Acute respiratory failure with hypoxia: Secondary | ICD-10-CM | POA: Diagnosis not present

## 2020-05-01 DIAGNOSIS — Z515 Encounter for palliative care: Secondary | ICD-10-CM

## 2020-05-01 DIAGNOSIS — Z8673 Personal history of transient ischemic attack (TIA), and cerebral infarction without residual deficits: Secondary | ICD-10-CM | POA: Diagnosis not present

## 2020-05-01 DIAGNOSIS — I4891 Unspecified atrial fibrillation: Secondary | ICD-10-CM | POA: Diagnosis not present

## 2020-05-01 DIAGNOSIS — J9610 Chronic respiratory failure, unspecified whether with hypoxia or hypercapnia: Secondary | ICD-10-CM

## 2020-05-01 DIAGNOSIS — J9621 Acute and chronic respiratory failure with hypoxia: Secondary | ICD-10-CM | POA: Diagnosis not present

## 2020-05-01 DIAGNOSIS — N179 Acute kidney failure, unspecified: Secondary | ICD-10-CM | POA: Diagnosis not present

## 2020-05-01 LAB — BASIC METABOLIC PANEL
Anion gap: 11 (ref 5–15)
Anion gap: 13 (ref 5–15)
BUN: 131 mg/dL — ABNORMAL HIGH (ref 8–23)
BUN: 149 mg/dL — ABNORMAL HIGH (ref 8–23)
CO2: 25 mmol/L (ref 22–32)
CO2: 23 mmol/L (ref 22–32)
Calcium: 8.8 mg/dL — ABNORMAL LOW (ref 8.9–10.3)
Calcium: 8.6 mg/dL — ABNORMAL LOW (ref 8.9–10.3)
Chloride: 100 mmol/L (ref 98–111)
Chloride: 99 mmol/L (ref 98–111)
Creatinine, Ser: 5.07 mg/dL — ABNORMAL HIGH (ref 0.44–1.00)
Creatinine, Ser: 4.9 mg/dL — ABNORMAL HIGH (ref 0.44–1.00)
GFR, Estimated: 8 mL/min — ABNORMAL LOW (ref >60)
GFR, Estimated: 9 mL/min — ABNORMAL LOW (ref >60)
Glucose, Bld: 167 mg/dL — ABNORMAL HIGH (ref 70–99)
Glucose, Bld: 163 mg/dL — ABNORMAL HIGH (ref 70–99)
Potassium: 5 mmol/L (ref 3.5–5.1)
Potassium: 5.1 mmol/L (ref 3.5–5.1)
Sodium: 136 mmol/L (ref 135–145)
Sodium: 135 mmol/L (ref 135–145)

## 2020-05-01 LAB — CBC
HCT: 22.7 % — ABNORMAL LOW (ref 36.0–46.0)
Hemoglobin: 6.9 g/dL — CL (ref 12.0–15.0)
MCH: 28.6 pg (ref 26.0–34.0)
MCHC: 30.4 g/dL (ref 30.0–36.0)
MCV: 94.2 fL (ref 80.0–100.0)
Platelets: 188 10*3/uL (ref 150–400)
RBC: 2.41 MIL/uL — ABNORMAL LOW (ref 3.87–5.11)
RDW: 17.6 % — ABNORMAL HIGH (ref 11.5–15.5)
WBC: 5.6 10*3/uL (ref 4.0–10.5)
nRBC: 0 % (ref 0.0–0.2)

## 2020-05-01 LAB — GLUCOSE, CAPILLARY
Glucose-Capillary: 147 mg/dL — ABNORMAL HIGH (ref 70–99)
Glucose-Capillary: 141 mg/dL — ABNORMAL HIGH (ref 70–99)
Glucose-Capillary: 150 mg/dL — ABNORMAL HIGH (ref 70–99)
Glucose-Capillary: 165 mg/dL — ABNORMAL HIGH (ref 70–99)
Glucose-Capillary: 159 mg/dL — ABNORMAL HIGH (ref 70–99)
Glucose-Capillary: 143 mg/dL — ABNORMAL HIGH (ref 70–99)

## 2020-05-01 LAB — MAGNESIUM: Magnesium: 1.6 mg/dL — ABNORMAL LOW (ref 1.7–2.4)

## 2020-05-01 LAB — HEMOGLOBIN AND HEMATOCRIT, BLOOD
HCT: 27.2 % — ABNORMAL LOW (ref 36.0–46.0)
Hemoglobin: 8.4 g/dL — ABNORMAL LOW (ref 12.0–15.0)

## 2020-05-01 LAB — PREPARE RBC (CROSSMATCH)

## 2020-05-01 MED ORDER — SODIUM CHLORIDE 0.9% IV SOLUTION: Freq: Once | INTRAVENOUS | Status: AC

## 2020-05-01 MED ORDER — APIXABAN 5 MG PO TABS
5.0000 mg | ORAL_TABLET | Freq: Two times a day (BID) | ORAL | Status: DC
  Administered 2020-05-02 – 2020-05-05 (×7): 5 mg
  Filled 2020-05-01 (×7): qty 1

## 2020-05-01 NOTE — Progress Notes (Addendum)
Patient ID: Rebecca Harrell, female   DOB: 12/22/1940, 80 y.o.   MRN: [id number]  PROGRESS NOTE    Rebecca Harrell  SJG:[id number] DOB: Sep 07, 1940 DOA: 04/15/2020 PCP: Aaron Edelman, MD   Brief Narrative:  80 year old female with history of chronic hypoxic respiratory failure with tracheostomy and PEG tube dependence, aspiration, hypertension, diabetes mellitus type 2, chronic diastolic CHF, seizure disorder, unspecified CVA with right-sided hemiparesis, chronic vegetative state, paroxysmal A. fib recently taken off anticoagulation, pneumonia receiving antibiotic treatment at [facility] prior to presentation presents with A. fib with RVR.  Chest x-ray was concerning for possibility partially loculated effusion.  CT was negative for PE pulmonary and cardiology were consulted.  Patient had worsening hypoxia and care was taken over by [facility] team.  She was treated with broad-spectrum antibiotics.  Sputum culture grew Pseudomonas aeruginosa.  Patient was treated with heparin drip.  She required 1 unit packed red cell transfusion on 04/22/2020.  She has had issues with hypotension requiring intermittent pressors and subsequently starting midodrine.  Nephrology is also following her for acute kidney injury.  Lurline Idol was changed on 04/24/2020 due to leak.  Palliative care also has been consulted.  She was transferred back to [facility] service on 04/25/2020.  Assessment & Plan:   Acute on chronic hypoxic respiratory failure, history of tracheostomy Aspiration pneumonitis -PCCM following and managing tracheostomy and ventilator.  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  Patient was treated with broad-spectrum antibiotics.  Antibiotics subsequently discontinued 04/20/2020 by PCCM.  Defer further antibiotics unless she develops new infiltrates on chest x-ray or spikes fever -Pulmonary/bronchial hygiene -Currently afebrile.  Chronic  hypotension -Blood pressure currently improved.  Continue midodrine 5 mg 3 times a day for now.  Pressors have been weaned off.  Continuous fentanyl has been discontinued.  Paroxysmal A. fib with RVR -Not on anticoagulation as an outpatient -Mostly rate controlled currently.  Cardiology has signed off.  Heparin drip was switched to Eliquis on 04/30/2020 after nephrology gave clearance to start Eliquis.  Continue amiodarone.  Outpatient follow-up with cardiology  AKI -Possibly from ATN from hypotension and hypoxia -Nephrology following: Follow recommendations.  Creatinine 5.07 today. -Patient is probably not a good candidate for dialysis -Renal ultrasound ruled out obstruction. -Monitor labs  Hyperkalemia -Resolved.  Off Lokelma.  Follow nephrology recommendations.  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  PEG tube feeding was changed on 04/29/2020 by dietitian as the prior PEG tube feeding was probably leading to increased hyperkalemia.  Continue Keppra/Seroquel. -Overall prognosis is very poor.  Palliative care follow-up appreciated.  Patient remains full code. -It seems that her overall care is very futile especially now since the creatinine is trending upwards.  I do not think patient is a candidate for dialysis.  Family should focus on total comfort measures.  Family is not ready for the same.  Ethics committee has been involved.  Follow recommendations.  Diabetes mellitus type II with hypoglycemia -Continue CBGs.  Off Levemir because of hypoglycemia  Anemia of chronic disease -Hemoglobin 6.9 today.  Will transfuse 1 more unit of packed red cells today..  Had required 1 unit packed red cell transfusion on 04/22/2020  Generalized conditioning -Overall prognosis is very poor.   -Palliative care follow-up appreciated.  Patient remains full code.  Stage II coccygeal pressure injury present on admission -Continue  local wound care.   DVT prophylaxis: Heparin drip Code Status: Full  Family Communication: None at bedside Disposition Plan: Status is: Inpatient  Remains inpatient appropriate because:Inpatient level of care appropriate due to severity of illness   Dispo: The patient is from: LTAC              Anticipated d/c is to: LTAC              Patient currently is not medically stable to d/c.   Difficult to place patient No  Consultants: PCCM/nephrology/palliative care  Procedures: None  Antimicrobials:  Anti-infectives (From admission, onward)   Start     Dose/Rate Route Frequency Ordered Stop   04/19/20 1115  ceFEPIme (MAXIPIME) 2 g in sodium chloride 0.9 % 100 mL IVPB        2 g 200 mL/hr over 30 Minutes Intravenous Every 24 hours 04/18/20 1503 04/20/20 1051   04/19/20 1102  ceFEPIme (MAXIPIME) 2 g in sodium chloride 0.9 % 100 mL IVPB  Status:  Discontinued        2 g 200 mL/hr over 30 Minutes Intravenous Every 24 hours 04/18/20 1501 04/18/20 1503   04/17/20 1100  linezolid (ZYVOX) IVPB 600 mg        600 mg 300 mL/hr over 60 Minutes Intravenous Every 12 hours 04/17/20 1002 04/20/20 0032   04/16/20 1400  vancomycin (VANCOREADY) IVPB 1250 mg/250 mL  Status:  Discontinued        1,250 mg 166.7 mL/hr over 90 Minutes Intravenous Every 24 hours 04/15/20 1436 04/17/20 0813   04/16/20 0600  ceFEPIme (MAXIPIME) 2 g in sodium chloride 0.9 % 100 mL IVPB  Status:  Discontinued        2 g 200 mL/hr over 30 Minutes Intravenous Every 12 hours 04/15/20 2350 04/18/20 1501   04/16/20 0000  azithromycin (ZITHROMAX) 500 mg in sodium chloride 0.9 % 250 mL IVPB        500 mg 250 mL/hr over 60 Minutes Intravenous Every 24 hours 04/15/20 2350 04/20/20 0100   04/15/20 1700  piperacillin-tazobactam (ZOSYN) IVPB 3.375 g        3.375 g 12.5 mL/hr over 240 Minutes Intravenous  Once 04/15/20 1646 04/15/20 2101   04/15/20 1315  vancomycin (VANCOREADY) IVPB 1500 mg/300 mL        1,500 mg 150 mL/hr over 120  Minutes Intravenous  Once 04/15/20 1305 04/15/20 1617       Subjective: Patient seen and examined at bedside.  No vomiting, fever, seizures, black or bloody stools reported per nursing staff.   Objective: Vitals:   05/01/20 0615 05/01/20 0630 05/01/20 0645 05/01/20 0735  BP: 118/75   108/70  Pulse: 89 89 90 89  Resp: (!) 25 (!) 25 20 (!) 25  Temp:      TempSrc:      SpO2: 100% 100% 100% 100%  Weight:      Height:        Intake/Output Summary (Last 24 hours) at 05/01/2020 0759 Last data filed at 05/01/2020 0600 Gross per 24 hour  Intake 1869.1 ml  Output 1475 ml  Net 394.1 ml   Filed Weights   04/29/20 0500 04/30/20 0353 05/01/20 0341  Weight: 94.7 kg 90.5 kg 92 kg    Examination:  General exam: Chronically ill looking female lying in bed.  No acute distress. ENT: On ventilator via tracheostomy respiratory system: Bilateral decreased breath sounds at bases with scattered crackles and tachypneic  cardiovascular system: S1-S2 heard, rate controlled gastrointestinal system: Abdomen is distended slightly, soft and nontender.  Bowel sounds  are heard.  PEG tube present. Extremities: No cyanosis; trace left lower extremity edema present; right leg amputation present Central nervous system: Wakes up slightly, does not follow commands at the time of my examination.  Right upper extremity contracture present.   Skin: No obvious ecchymosis/lesions psychiatry: Could not be assessed because of mental status Data Reviewed: I have personally reviewed following labs and imaging studies  CBC: Recent Labs  Lab 04/27/20 0602 04/28/20 0130 04/29/20 0320 04/30/20 0222 05/01/20 0135  WBC 9.3 7.9 7.7 7.5 5.6  HGB 7.6* 7.1* 7.4* 7.6* 6.9*  HCT 24.3* 23.5* 23.5* 24.7* 22.7*  MCV 92.0 95.1 92.9 95.0 94.2  PLT 186 189 172 170 035   Basic Metabolic Panel: Recent Labs  Lab 04/26/20 0159 04/27/20 0602 04/28/20 0130 04/28/20 0459 04/28/20 0720 04/28/20 1918 04/28/20 2317  04/29/20 0320 04/29/20 0726 04/29/20 1113 04/30/20 0222 05/01/20 0135  NA 135 134*   < > 134*  --  135  --  136  --   --  135 136  K 4.9 5.9*   < > 6.0*   < > 5.8*   < > 5.3* 5.3* 4.9 5.0 5.0  CL 103 100   < > 100  --  101  --  99  --   --  100 100  CO2 23 22   < > 21*  --  23  --  22  --   --  21* 25  GLUCOSE 151* 145*   < > 137*  --  107*  --  73  --   --  119* 167*  BUN 102* 109*   < > 113*  --  115*  --  116*  --   --  117* 131*  CREATININE 4.64* 4.83*   < > 4.94*  --  4.90*  --  5.00*  --   --  4.98* 5.07*  CALCIUM 8.5* 8.7*   < > 9.0  --  9.1  --  9.2  --   --  8.9 8.8*  MG 1.8 1.9  --   --   --   --   --   --   --   --   --   --    < > = values in this interval not displayed.   GFR: Estimated Creatinine Clearance: 10.8 mL/min (A) (by C-G formula based on SCr of 5.07 mg/dL (H)). Liver Function Tests: No results for input(s): AST, ALT, ALKPHOS, BILITOT, PROT, ALBUMIN in the last 168 hours. No results for input(s): LIPASE, AMYLASE in the last 168 hours. No results for input(s): AMMONIA in the last 168 hours. Coagulation Profile: No results for input(s): INR, PROTIME in the last 168 hours. Cardiac Enzymes: No results for input(s): CKTOTAL, CKMB, CKMBINDEX, TROPONINI in the last 168 hours. BNP (last 3 results) No results for input(s): PROBNP in the last 8760 hours. HbA1C: No results for input(s): HGBA1C in the last 72 hours. CBG: Recent Labs  Lab 04/30/20 1102 04/30/20 1507 04/30/20 1931 04/30/20 2332 05/01/20 0318  GLUCAP 149* 165* 178* 165* 147*   Lipid Profile: No results for input(s): CHOL, HDL, LDLCALC, TRIG, CHOLHDL, LDLDIRECT in the last 72 hours. Thyroid Function Tests: No results for input(s): TSH, T4TOTAL, FREET4, T3FREE, THYROIDAB in the last 72 hours. Anemia Panel: No results for input(s): VITAMINB12, FOLATE, FERRITIN, TIBC, IRON, RETICCTPCT in the last 72 hours. Sepsis Labs: No results for input(s): PROCALCITON, LATICACIDVEN in the last 168 hours.  No  results found for  this or any previous visit (from the past 240 hour(s)).       Radiology Studies: No results found.      Scheduled Meds: . amiodarone  200 mg Per Tube Daily  . apixaban  5 mg Oral BID  . chlorhexidine gluconate (MEDLINE KIT)  15 mL Mouth Rinse BID  . Chlorhexidine Gluconate Cloth  6 each Topical Daily  . feeding supplement (PROSource TF)  45 mL Per Tube Daily  . fiber  1 packet Per Tube BID  . free water  30 mL Per Tube Q4H  . insulin aspart  0-15 Units Subcutaneous Q4H  . levETIRAcetam  500 mg Per Tube QHS  . mouth rinse  15 mL Mouth Rinse 10 times per day  . midodrine  5 mg Per Tube Q8H  . nutrition supplement (JUVEN)  1 packet Per Tube BID BM  . pantoprazole sodium  40 mg Per Tube Q24H  . QUEtiapine  25 mg Per Tube QHS  . sodium chloride flush  10-40 mL Intracatheter Q12H   Continuous Infusions: . sodium chloride    . feeding supplement (VITAL AF 1.2 CAL) 1,000 mL (05/01/20 0617)  . furosemide Stopped (04/30/20 1950)          Aline August, MD [facility] 05/01/2020, 7:59 AM

## 2020-05-01 NOTE — Progress Notes (Signed)
Palliative Care Progress Note  Reason for consult: Goals of care  Chart reviewed including progress notes from today from the rest of the care team.  Note that she appears to be having increased urine output and no urgent need for consideration of dialysis at this time.  I saw and examined Ms. Capano today.  She was awake and alert on entering the room.  She is interactive and nods yes or no to simple questions.  I asked if she understands what is going on and she indicated yes.  We talked about concern for need for dialysis moving forward.  I asked that this is something she would want to pursue she indicated no.  This seemed consistent with other providers experience when they have also been discussing with her.  It is still difficult for me to say that she has higher level understanding of implications of her decisions and truly has capacity to make decisions independently.  I called and was able to reach patient's children including Ervinda, Irvin, and [place].  [name] reports that she was in to talk to her mom about her wishes moving forward.  She tells me that her mother was alert and interactive with her.  When she asked her about dialysis, she indicates that her mother also indicated "no" to her with nodding.  She then tells me, however, that she feels her mother did not understand the question.  [name] reports that the second time she asked her, her mother " tried to give me a thumbs up, meaning she would want dialysis."  We discussed that her urine output is little bit better today and there is not a current need to urgently consider dialysis.  At the same time, I advised family that they need to continue to discuss amongst themselves and work in conjunction with medical team to try and best determine what Ms. Illes's wishes would be for her care moving forward if she were to truly understands her situation and be able to speak for herself.  She has been chronically ill for a long period of time  and she is going to continue to face additional acute medical issues and overall decline as time moves forward.  We discussed that, while there is not an urgent need for dialysis and her renal function may actually be improving, she would still not be a candidate for long-term dialysis if her renal function worsens again.  Additionally, we reviewed concern that consideration for any short-term dialysis would only be beneficial if she has some sort of renal impairment that is likely to recover in very short order as she is not a long term candidate.  Overall, Ms. Elmer remains critically ill and is at high risk for further decompensation.  I have encouraged family to continue to consider interventions that may support their goal of her being well enough to be back out of the hospital while also recommending limitations of care for interventions that are not likely to result in her ever being well enough to leave the hospital (we have specifically been discussing dialysis and CPR in the event of cardiac arrest).  Her children are still requesting continuation of any all aggressive interventions including full CODE STATUS.  Currently, it appears her renal function is improving and therefore I think she would be best served by seeing how her condition progresses over the next few days.  I believe her family is going to continue to desire any offered interventions in the care of Ms. How moving  forward.  I would continue to support that heroic interventions such as CPR in the event of a natural death are unlikely to restore function and will potentially cause suffering without realistic possibility of benefit.  Appreciate Dr. Arlyss Queen input from ethics perspective.  Please see his note for further discussion.  Start time: 1800 End time: 1855 Total time: 55 minutes  Greater than 50%  of this time was spent counseling and coordinating care related to the above assessment and plan. [name], MD [place] Team [phone]

## 2020-05-01 NOTE — Progress Notes (Signed)
Daily Progress Note   Patient Name: Rebecca Harrell       Date: 05/01/2020 DOB: Feb 03, 1941  Age: 80 y.o. MRN#: [id number] Attending Physician: Aline August, MD Primary Care Physician: Aaron Edelman, MD Admit Date: 04/15/2020  Reason for Consultation/Follow-up: Establishing goals of care  Subjective:  awake alert, resting in bed with eyes open, looks upward.  Patient mouths words appropriately, follows commands, attempts to track me in the room from one side of the bed to another. She answers a few questions by mouthing her words, mostly in mono syllables such as "yes" or " no".   Discussed with bedside nursing staff, appreciate their care of the patient, see below.   Length of Stay: 16  Current Medications: Scheduled Meds:  . amiodarone  200 mg Per Tube Daily  . apixaban  5 mg Oral BID  . chlorhexidine gluconate (MEDLINE KIT)  15 mL Mouth Rinse BID  . Chlorhexidine Gluconate Cloth  6 each Topical Daily  . feeding supplement (PROSource TF)  45 mL Per Tube Daily  . fiber  1 packet Per Tube BID  . free water  30 mL Per Tube Q4H  . insulin aspart  0-15 Units Subcutaneous Q4H  . levETIRAcetam  500 mg Per Tube QHS  . mouth rinse  15 mL Mouth Rinse 10 times per day  . midodrine  5 mg Per Tube Q8H  . nutrition supplement (JUVEN)  1 packet Per Tube BID BM  . pantoprazole sodium  40 mg Per Tube Q24H  . QUEtiapine  25 mg Per Tube QHS  . sodium chloride flush  10-40 mL Intracatheter Q12H    Continuous Infusions: . sodium chloride 10 mL/hr at 05/01/20 1600  . feeding supplement (VITAL AF 1.2 CAL) 1,000 mL (05/01/20 0617)  . furosemide Stopped (05/01/20 1019)    PRN Meds: acetaminophen, dextrose, fentaNYL (SUBLIMAZE) injection, ipratropium-albuterol, ondansetron (ZOFRAN) IV,  oxyCODONE, sodium chloride flush  Physical Exam         Patient has a trach and PEG Appears with contractures and generalized weakness  follows commands and mouths few words No distress Coarse breath sounds Awake alert  Vital Signs: BP (!) 126/96 (BP Location: Left Arm)   Pulse 89   Temp 98.7 F (37.1 C) (Oral)   Resp (!) 25   Ht $R'5\' 7"'kS$  (1.702  m)   Wt 92 kg   SpO2 100%   BMI 31.77 kg/m  SpO2: SpO2: 100 % O2 Device: O2 Device: Ventilator O2 Flow Rate: O2 Flow Rate (L/min): 40 L/min  Intake/output summary:   Intake/Output Summary (Last 24 hours) at 05/01/2020 1638 Last data filed at 05/01/2020 1600 Gross per 24 hour  Intake 2524.82 ml  Output 1675 ml  Net 849.82 ml   LBM: Last BM Date: 05/01/20 Baseline Weight: Weight: 77.1 kg Most recent weight: Weight: 92 kg      PPS 40% Palliative Assessment/Data:    Flowsheet Rows   Flowsheet Row Most Recent Value  Intake Tab   Referral Department Hospitalist  Unit at Time of Referral Cardiac/Telemetry Unit  Palliative Care Primary Diagnosis Pulmonary  Date Notified 04/19/20  Palliative Care Type New Palliative care  Reason for referral Clarify Goals of Care  Date of Admission 04/15/20  Date first seen by Palliative Care 04/20/20  # of days Palliative referral response time 1 Day(s)  # of days IP prior to Palliative referral 4  Clinical Assessment   Psychosocial & Spiritual Assessment   Palliative Care Outcomes       Patient Active Problem List   Diagnosis Date Noted  . Chronic respiratory failure ([place])   . Pressure injury of skin 04/20/2020  . Acute respiratory distress   . AKI (acute kidney injury) ([place])   . VRE (vancomycin resistant enterococcus) culture positive   . Hypertension associated with diabetes ([place])   . Acute pulmonary edema ([facility])   . Atrial fibrillation with rapid ventricular response ([place]) 04/15/2020  . History of CVA (cerebrovascular accident) 04/15/2020  . Normocytic anemia 04/15/2020  .  Tracheostomy dependence ([place]) 04/15/2020  . Hypotension 04/15/2020  . Chronic pulmonary aspiration 04/15/2020  . Seizure disorder ([place]) 04/15/2020  . Acute on chronic respiratory failure with hypoxia ([place]) 04/15/2020  . Pneumonia 04/15/2020    Palliative Care Assessment & Plan   Patient Profile:  Pt is a 80 y.o. yo female  with PMH of DM, HFpEF, CVA with right-sided hemiparesis/contracture, s/p right BKA, PEG tube, chronic respiratory failure status post tracheostomy, she was recently at [facility] where treated for HCAP developed A. fib with RVR, hypotension and was sent to ER.  Assessment:  PMT consulted for goals of care discussions, discussions regarding her renal condition and issues related to dialysis.   Recommendations/Plan:  Noted to have urine output, renal following  Continue current mode of care, monitor urine output and renal indices, PMT to follow peripherally.     Code Status:    Code Status Orders  (From admission, onward)         Start     Ordered   04/15/20 2347  Full code  Continuous        04/15/20 2350        Code Status History    This patient has a current code status but no historical code status.   Advance Care Planning Activity       Prognosis:   guarded   Discharge Planning:  To Be Determined  Care plan was discussed with  Patient and IDT.   Thank you for allowing the Palliative Medicine Team to assist in the care of this patient.   Time In: 1600 Time Out: 1625 Total Time 25 Prolonged Time Billed No       Greater than 50%  of this time was spent counseling and coordinating care related to the above assessment and plan.  Loistine Chance, MD  Please contact Palliative Medicine Team phone at [phone] for questions and concerns.

## 2020-05-01 NOTE — Progress Notes (Signed)
[facility] NEPHROLOGY PROGRESS NOTE  Assessment/ Plan: Pt is a 80 y.o. yo female  with PMH of DM, HFpEF, CVA with right-sided hemiparesis/contracture, s/p right BKA, PEG tube, chronic respiratory failure status post tracheostomy, she was recently at [facility] where treated for HCAP developed A. fib with RVR, hypotension and was sent to ER.  Acute kidney injury: Cr 1 as of 04/15/20 now with severe AKI. Suspect hemodynamically mediated AKI in the setting of A. fib with RVR, IV contrast, recent infection.  UA without proteinuria or microscopic hematuria.  Kidney ultrasound ruled out obstruction.  Not a candidate for long term dialysis in light of her chronic limited function status, comorbidities as well detailed in 04/29/20 note by Dr. Candiss Norse.  - Thankfully there are no indications and her UOP has been good which hopefully indicates renal recovery.  She is awake and alert with acceptable electrolytes and acid/base.  - I had a meaningful discussion with her today about dialysis should indications arise.  She is awake alert and can interact.  She clearly tells me no dialysis.  She mouths emphatically "no" and shakes her head.  I repeated to her what her wishes were and she nodded in agreement.  I asked her to d/w family when she sees them. - Continue max supportive care.  My hope is her renal function will improve as this is a precipitated AKI.   -daily labs, monitor strict I/O please  Hyperkalemia, improved: stopped osmolite TF's, switched to nepro. Supportive care at the moment. C/w Lasix 113m BID, c/w lokelma 10g PRN, shifting agents prn.  K was 5 this AM.   Hypotension, h/o HTN - on midodrine  Acute on chronic hypoxic respiratory failure, h/o trach: Currently on vent in ICU.  PCCM is following.  Severe sepsis/Healthcare associated pneumonia/septic shock: Treated with broad-spectrum antibiotics per primary team.  Off pressors, on midodrine  A. fib with RVR: On Eliquis,  diltiazem.  Nutrition: She has hypoalbuminemia. Supplement per RD.  Subjective: No acute events. I/Os 1.9 / 1.2.   Objective Vital signs in last 24 hours: Vitals:   05/01/20 0945 05/01/20 1000 05/01/20 1100 05/01/20 1107  BP: (!) 118/91 124/75 121/77   Pulse: 89 89 90 90  Resp: (!) 22 (!) 31 (!) 28 (!) 24  Temp: 97.7 F (36.5 C)   98 F (36.7 C)  TempSrc: Oral     SpO2: 100% 100% 100% 100%  Weight:      Height:       Weight change: 1.5 kg  Intake/Output Summary (Last 24 hours) at 05/01/2020 1114 Last data filed at 05/01/2020 1000 Gross per 24 hour  Intake 2275.79 ml  Output 1775 ml  Net 500.79 ml       Labs: Basic Metabolic Panel: Recent Labs  Lab 04/29/20 0320 04/29/20 0726 04/29/20 1113 04/30/20 0222 05/01/20 0135  NA 136  --   --  135 136  K 5.3*   < > 4.9 5.0 5.0  CL 99  --   --  100 100  CO2 22  --   --  21* 25  GLUCOSE 73  --   --  119* 167*  BUN 116*  --   --  117* 131*  CREATININE 5.00*  --   --  4.98* 5.07*  CALCIUM 9.2  --   --  8.9 8.8*   < > = values in this interval not displayed.   Liver Function Tests: No results for input(s): AST, ALT, ALKPHOS, BILITOT, PROT,  ALBUMIN in the last 168 hours. No results for input(s): LIPASE, AMYLASE in the last 168 hours. No results for input(s): AMMONIA in the last 168 hours. CBC: Recent Labs  Lab 04/27/20 0602 04/28/20 0130 04/29/20 0320 04/30/20 0222 05/01/20 0135  WBC 9.3 7.9 7.7 7.5 5.6  HGB 7.6* 7.1* 7.4* 7.6* 6.9*  HCT 24.3* 23.5* 23.5* 24.7* 22.7*  MCV 92.0 95.1 92.9 95.0 94.2  PLT 186 189 172 170 188   Cardiac Enzymes: No results for input(s): CKTOTAL, CKMB, CKMBINDEX, TROPONINI in the last 168 hours. CBG: Recent Labs  Lab 04/30/20 1931 04/30/20 2332 05/01/20 0318 05/01/20 0808 05/01/20 1105  GLUCAP 178* 165* 147* 141* 150*    Iron Studies:  No results for input(s): IRON, TIBC, TRANSFERRIN, FERRITIN in the last 72 hours. Studies/Results: No results  found.  Medications: Infusions: . sodium chloride 250 mL (05/01/20 0916)  . feeding supplement (VITAL AF 1.2 CAL) 1,000 mL (05/01/20 0617)  . furosemide 120 mg (05/01/20 0919)    Scheduled Medications: . amiodarone  200 mg Per Tube Daily  . apixaban  5 mg Oral BID  . chlorhexidine gluconate (MEDLINE KIT)  15 mL Mouth Rinse BID  . Chlorhexidine Gluconate Cloth  6 each Topical Daily  . feeding supplement (PROSource TF)  45 mL Per Tube Daily  . fiber  1 packet Per Tube BID  . free water  30 mL Per Tube Q4H  . insulin aspart  0-15 Units Subcutaneous Q4H  . levETIRAcetam  500 mg Per Tube QHS  . mouth rinse  15 mL Mouth Rinse 10 times per day  . midodrine  5 mg Per Tube Q8H  . nutrition supplement (JUVEN)  1 packet Per Tube BID BM  . pantoprazole sodium  40 mg Per Tube Q24H  . QUEtiapine  25 mg Per Tube QHS  . sodium chloride flush  10-40 mL Intracatheter Q12H    have reviewed scheduled and prn medications.  Physical Exam: General: Chronically ill looking female, on mechanical ventilation  Neck: +trach Heart:RRR, s1s2 nl Lungs: coarse BL, on vent via trach Abdomen:soft,  non-distended Extremities: very trace edema throughout, Contracture of upper extremities, right AKA. Neurology:  Following commands, mouthing yes or no and shaking her head, awake and responsive   Justin Mend 05/01/2020,11:14 AM  LOS: 16 days

## 2020-05-01 NOTE — Progress Notes (Signed)
[facility] Progress Note Patient Name: Rebecca Harrell DOB: [birth year] MRN: [id number]   Date of Service  05/01/2020  HPI/Events of Note  EKG done for rhythm change reveals atrial fibrillation with rapid ventricular response. Ventricular rate = 108. Left axis deviation Non-specific intra-ventricular conduction block. Anterolateral infarct , age undetermined. HR now = 93. Patient has hx of LBBB.  eICU Interventions  Continue present management.      Intervention Category Major Interventions: Arrhythmia - evaluation and management  Jayziah Bankhead Cornelia Copa 05/01/2020, 10:33 PM

## 2020-05-01 NOTE — Progress Notes (Signed)
NAME:  Rebecca Harrell, MRN:  [id number], DOB:  Dec 22, 1940, LOS: 55 ADMISSION DATE:  04/15/2020, CONSULTATION DATE:  04/16/2020 REFERRING MD:  Dr. Myna Hidalgo, [facility] CHIEF COMPLAINT:  04/16/2020  Brief History:  8 yoF with chronic respiratory failure s/p trach sent from [facility] for Afib with RVR.  Patient is also being treated for pneumonia on zosyn pta.  CXR concerning for possible partially loculated effusion.  Pulmonary consulted for further evaluation.   Past Medical History:  HTN, DM, Chronic diastolic CHF, CVA with Rt side weakness, Seizure, Aspiration pneumonia, s/p trach and PEG, PAF  Significant Hospital Events:  3/13 Admitted [place] 3/15 on trach collar 5L 28% 3/17 Worsening hypoxemia and increased secretions 3/18 PCCM called to see patient overnight secondary to increasing work of breathing and hypoxia secondary to possible aspiration event 3/19 air leak from trach >> improved after changing to pressure support; change to heparin gtt 3/20 transfuse 1 unit PRBC; off pressors 3/21 back on pressors; start midodrine and reduce cardizem dose. ATC tolerated for 7hrs 3/22 Trach changed to 8 Shiley cuffed due to leak  3/23 Some issues with trach bleeding post exchange but improved this AM.[facility] assumed as primary care, PCCM on for vent and trach care   Consults:  Cardiology Palliative care Nephrology  Procedures:   Trach exchanged 3/22 and 8 cuffed shiley placed   Significant Diagnostic Tests:   3/13 CTA PE >> pulmonary edema, small b/p effusions, small loculated component in minor fissure and oblique fissure  3/13 CT abd/ pelvis >> No acute findings within the abdomen.  3/16 renal u/s >> cholelithiasis, normal kidneys  Micro Data:  3/13 BCx>>negative 3/13 SARS 2/ flu >> negative 3/13 UCx >> 100k Vanc resistant enterococcus 3/14 MRSA >> negative 3/17 sputum >> Pseudomonas aeruginosa (resistant to fortaz and imipenem)  Antimicrobials:  Vancomycin 3/13 >> 3/14 Zosyn 3/13>>>off   Zithromax 3/13 >> 3/17 Linezolid 3/15 >> 3/17 Cefepime 3/13 >> 3/19  Interim History / Subjective:  Bloody secretions improved  No sig change  Notes/ethics consult reviewed  Remains on full vent support, not tol weaning efforts   Objective   Blood pressure 118/81, pulse 89, temperature 99 F (37.2 C), temperature source Oral, resp. rate (!) 25, height '5\' 7"'$  (1.702 m), weight 92 kg, SpO2 100 %.    Vent Mode: PRVC FiO2 (%):  [40 %] 40 % Set Rate:  [25 bmp] 25 bmp Vt Set:  [400 mL] 400 mL PEEP:  [5 cmH20] 5 cmH20 Plateau Pressure:  [28 cmH20-31 cmH20] 31 cmH20   Intake/Output Summary (Last 24 hours) at 05/01/2020 0843 Last data filed at 05/01/2020 0600 Gross per 24 hour  Intake 1801.12 ml  Output 1475 ml  Net 326.12 ml   Filed Weights   04/29/20 0500 04/30/20 0353 05/01/20 0341  Weight: 94.7 kg 90.5 kg 92 kg    Physical Exam General: chronically ill appearing female, NAD on vent  HEENT: 8 cuffed Shiley trach clean and dry, Oropharynx otherwise clear. Neuro: somnolent but opens eyes, nods occasionally, tracks at times CV: Regular, no murmur PULM: resps even non labored on vent, no increased work of breathing, full support  GI: Nondistended, positive bowel sounds, TF running Extremities: Warm, dry, no edema, right AKA Skin: No rash  Resolved Hospital Problem list   VRE UTI, Aspiration pneumonitis with drug resistant Pseudomonas aeruginosa  Assessment & Plan:   Acute on chronic hypoxic respiratory failure from aspiration pneumonitis. Tracheostomy status. Trach site bleeding after upsize 8.0 Shiley, resolved Secretions with frequent suctioning  P: Vent support - 8cc/kg  F/u CXR intermittently F/u ABG as needed  Continue attempts at PS wean - not tolerating currently  Pulmonary hygiene standard trach care VAP prevention orders Continue goals of care discussions - at this point would likely need to return to vent SNF when ready for placement   Rest of acute and  chronic medical conditions including but not limited to:are not managed per TRH Hypotension from hypovolemia and sedation. Loculated pleural effusions in minor fissure and oblique fissure. Atrial fibrillation Hx of CVA with Rt hemiparesis Seizure disorder Vegetative state AKI from ATN 2nd to hypoxia and hypotension. Hyperkalemia  Positive fluid balance  Anemia of critical illness and chronic disease. 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 Goals of care Pressure injury  PCCM will continue to manage vent and trach 1-2x/week, call if needed sooner   Best practice (evaluated daily)  Diet: tube feeds DVT prophylaxis: heparin gtt GI prophylaxis: protonix Mobility: bed rest Disposition: ICU Code Status: full code  Labs    CMP Latest Ref Rng & Units 05/01/2020 04/30/2020 04/29/2020  Glucose 70 - 99 mg/dL 167(H) 119(H) -  BUN 8 - 23 mg/dL 131(H) 117(H) -  Creatinine 0.44 - 1.00 mg/dL 5.07(H) 4.98(H) -  Sodium 135 - 145 mmol/L 136 135 -  Potassium 3.5 - 5.1 mmol/L 5.0 5.0 4.9  Chloride 98 - 111 mmol/L 100 100 -  CO2 22 - 32 mmol/L 25 21(L) -  Calcium 8.9 - 10.3 mg/dL 8.8(L) 8.9 -  Total Protein 6.5 - 8.1 g/dL - - -  Total Bilirubin 0.3 - 1.2 mg/dL - - -  Alkaline Phos 38 - 126 U/L - - -  AST 15 - 41 U/L - - -  ALT 0 - 44 U/L - - -    CBC Latest Ref Rng & Units 05/01/2020 04/30/2020 04/29/2020  WBC 4.0 - 10.5 K/uL 5.6 7.5 7.7  Hemoglobin 12.0 - 15.0 g/dL 6.9(LL) 7.6(L) 7.4(L)  Hematocrit 36.0 - 46.0 % 22.7(L) 24.7(L) 23.5(L)  Platelets 150 - 400 K/uL 188 170 172    ABG    Component Value Date/Time   PHART 7.297 (L) 04/20/2020 1022   PCO2ART 51.8 (H) 04/20/2020 1022   PO2ART 335 (H) 04/20/2020 1022   HCO3 25.3 04/20/2020 1022   TCO2 27 04/20/2020 1022   ACIDBASEDEF 1.0 04/20/2020 1022   O2SAT 100.0 04/20/2020 1022    CBG (last 3)  Recent Labs    04/30/20 2332 05/01/20 0318 05/01/20 0808  GLUCAP 165* 147* 141*    Signature:    Nickolas Madrid,  NP Pulmonary/Critical Care Medicine  05/01/2020  8:43 AM

## 2020-05-02 DIAGNOSIS — I4891 Unspecified atrial fibrillation: Secondary | ICD-10-CM | POA: Diagnosis not present

## 2020-05-02 DIAGNOSIS — N179 Acute kidney failure, unspecified: Secondary | ICD-10-CM | POA: Diagnosis not present

## 2020-05-02 DIAGNOSIS — T17908S Unspecified foreign body in respiratory tract, part unspecified causing other injury, sequela: Secondary | ICD-10-CM | POA: Diagnosis not present

## 2020-05-02 DIAGNOSIS — Z2239 Carrier of other specified bacterial diseases: Secondary | ICD-10-CM

## 2020-05-02 DIAGNOSIS — J9621 Acute and chronic respiratory failure with hypoxia: Secondary | ICD-10-CM | POA: Diagnosis not present

## 2020-05-02 LAB — CBC WITH DIFFERENTIAL/PLATELET
Abs Immature Granulocytes: 0.09 10*3/uL — ABNORMAL HIGH (ref 0.00–0.07)
Basophils Absolute: 0 10*3/uL (ref 0.0–0.1)
Basophils Relative: 0 %
Eosinophils Absolute: 0.5 10*3/uL (ref 0.0–0.5)
Eosinophils Relative: 8 %
HCT: 27.9 % — ABNORMAL LOW (ref 36.0–46.0)
Hemoglobin: 9.1 g/dL — ABNORMAL LOW (ref 12.0–15.0)
Immature Granulocytes: 1 %
Lymphocytes Relative: 13 %
Lymphs Abs: 0.9 10*3/uL (ref 0.7–4.0)
MCH: 29.5 pg (ref 26.0–34.0)
MCHC: 32.6 g/dL (ref 30.0–36.0)
MCV: 90.6 fL (ref 80.0–100.0)
Monocytes Absolute: 0.5 10*3/uL (ref 0.1–1.0)
Monocytes Relative: 7 %
Neutro Abs: 5.1 10*3/uL (ref 1.7–7.7)
Neutrophils Relative %: 71 %
Platelets: 227 10*3/uL (ref 150–400)
RBC: 3.08 MIL/uL — ABNORMAL LOW (ref 3.87–5.11)
RDW: 17.2 % — ABNORMAL HIGH (ref 11.5–15.5)
WBC: 7.1 10*3/uL (ref 4.0–10.5)
nRBC: 0 % (ref 0.0–0.2)

## 2020-05-02 LAB — BASIC METABOLIC PANEL
Anion gap: 14 (ref 5–15)
BUN: 152 mg/dL — ABNORMAL HIGH (ref 8–23)
CO2: 24 mmol/L (ref 22–32)
Calcium: 8.8 mg/dL — ABNORMAL LOW (ref 8.9–10.3)
Chloride: 98 mmol/L (ref 98–111)
Creatinine, Ser: 4.88 mg/dL — ABNORMAL HIGH (ref 0.44–1.00)
GFR, Estimated: 9 mL/min — ABNORMAL LOW (ref >60)
Glucose, Bld: 185 mg/dL — ABNORMAL HIGH (ref 70–99)
Potassium: 4.6 mmol/L (ref 3.5–5.1)
Sodium: 136 mmol/L (ref 135–145)

## 2020-05-02 LAB — TYPE AND SCREEN
ABO/RH(D): O POS
Antibody Screen: NEGATIVE
Unit division: 0

## 2020-05-02 LAB — BPAM RBC
Blood Product Expiration Date: 202204262359
ISSUE DATE / TIME: 202203290552
Unit Type and Rh: 5100

## 2020-05-02 LAB — GLUCOSE, CAPILLARY
Glucose-Capillary: 160 mg/dL — ABNORMAL HIGH (ref 70–99)
Glucose-Capillary: 149 mg/dL — ABNORMAL HIGH (ref 70–99)
Glucose-Capillary: 151 mg/dL — ABNORMAL HIGH (ref 70–99)
Glucose-Capillary: 139 mg/dL — ABNORMAL HIGH (ref 70–99)
Glucose-Capillary: 129 mg/dL — ABNORMAL HIGH (ref 70–99)
Glucose-Capillary: 169 mg/dL — ABNORMAL HIGH (ref 70–99)

## 2020-05-02 MED ORDER — MAGNESIUM SULFATE IN D5W 1-5 GM/100ML-% IV SOLN
1.0000 g | Freq: Once | INTRAVENOUS | Status: AC
  Administered 2020-05-02: 1 g via INTRAVENOUS
  Filled 2020-05-02: qty 100

## 2020-05-02 NOTE — Plan of Care (Signed)
  Problem: Education: Goal: Knowledge of disease or condition will improve Outcome: Progressing   Problem: Activity: Goal: Ability to tolerate increased activity will improve Outcome: Progressing   

## 2020-05-02 NOTE — Progress Notes (Signed)
This chaplain responded to Palliative Medicine Team consult for creating or updating the Pt. Advance Directive.  The chaplain read the chart notes and consulted the PMT before phoning the Pt. Daughter-Ervinda.  The chaplain understands Rebecca Harrell is driving and prefers a return call from the chaplain around 11:30am today to explain the purpose of an AD and the role of an HCPOA.

## 2020-05-02 NOTE — Progress Notes (Signed)
PROGRESS NOTE    Rebecca Harrell  KNL:[id number] DOB: 1941-01-10 DOA: 04/15/2020 PCP: Aaron Edelman, MD    Brief Narrative:  Rebecca Harrell was admitted to the hospital with acute on chronic hypoxic respiratory failure due to aspiration pneumonitis.  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 LTAC resident who was transferred to acute care due to persistent tachycardia for about 48 hours, refractive to intravenous metoprolol. She was unable to give detailed history.  At time of transfer oxygen saturation mid 90s on 8 L/min supplemental oxygen, respiratory rate 30, heart rate 130, blood pressure 86/45.  She had a tracheostomy in place, coarse breath sounds bilaterally, scattered rhonchi with increased work of breathing, heart S1-S2, irregularly irregular, tachycardic, abdomen soft nontender, no lower extremity edema.  Patient was diagnosed with atrial fibrillation with rapid ventricular response, complicated with acute on chronic hypoxic respiratory failure, aspiration pneumonitis.  Admitted to the intensive care unit and placed on broad-spectrum antibiotic therapy. Bronch positive for Pseudomonas, possible colonization. Placed on anticoagulation with heparin drip.  She required 1 unit packed red blood cell transfusion.  Developed intermittent hypotension requiring vasopressor, transition to midodrine.  Tracheostomy change 3/22.  Transferred to [facility] 3/23.  Patient continue with invasive mechanical ventilation per tracheostomy. Developed ATN with worsening renal function.   Assessment & Plan:   Principal Problem:   Atrial fibrillation with rapid ventricular response (HCC) Active Problems:   History of CVA (cerebrovascular accident)    Normocytic anemia   Tracheostomy dependence (HCC)   Hypotension   Chronic pulmonary aspiration   Seizure disorder (HCC)   Acute on chronic respiratory failure with hypoxia (HCC)   Pneumonia   Hypertension associated with diabetes ([place])   Acute respiratory distress   AKI (acute kidney injury) ([place])   VRE (vancomycin resistant enterococcus) culture positive   Pressure injury of skin   Chronic respiratory failure ([place])   Palliative care by specialist   Goals of care, counseling/discussion   1. Acute on chronic hypoxemic respiratory failure. Patient continue with invasive mechanical ventilation, mandatory mode PRVC, with peep of 5 and Vt 400, no sedation needed, no significant dyssynchrony.  Today has not been on pressure support. Continue trach care and bronchodilators. As needed fentanyl  May try PS in am.   2. AKI with ATN. Hypokalemia renal function with serum cr at 4,88 with BUN of 152 and serum bicarbonate at 24. Urine output 1,500 ml over last 24 hrs.  Continue supportive medical care, patient had furosemide today, for congestion. Blood pressure support with midodrine.   Follow up on renal function in am, avoid hypotension and nephrotoxic medications. No current acute indication for renal replacement therapy.   3. Paroxysmal atrial fibrillation. Continue rate control with amiodarone and anticoagulation with apixaban. Continue telemetry monitoring.   4. T2DM Continue glucose cover and monitoring with insulin sliding scale, patient is tolerating well tube feedings.   5. Anemia of chronic disease. Stable Hgb and hct, no indication for transfusion.   6. Hx of CVA with right sided hemiparesis/ stage 2 pressure ulcer (present on admission).  Patient with poor communications, right sided hemiparesis, swallow dysfunction and ventilator dependent respiratory failure. Poor prognosis, chronic multiorgan failure.   Continue with keppra per feeding tube.  On Quetiapine   Patient  continue to be at high risk for worsening renal and respiratory failure  Status is: Inpatient  Remains inpatient appropriate because:IV treatments appropriate due to intensity of illness or inability to take PO   Dispo: The patient is from: LTAC              Anticipated d/c is to: LTAC              Patient currently is not medically stable to d/c.   Difficult to place patient No   DVT prophylaxis: apixaban   Code Status:   full  Family Communication:  No family at the bedside      Nutrition Status: Nutrition Problem: Inadequate oral intake Etiology: inability to eat Signs/Symptoms: NPO status Interventions: Prostat,Tube feeding     Skin Documentation: Pressure Injury 04/20/20 Coccyx Stage 2 -  Partial thickness loss of dermis presenting as a shallow open injury with a red, pink wound bed without slough. (Active)  04/20/20 0300  Location: Coccyx  Location Orientation:   Staging: Stage 2 -  Partial thickness loss of dermis presenting as a shallow open injury with a red, pink wound bed without slough.  Wound Description (Comments):   Present on Admission: Yes     Consultants:  Nephrology   Subjective: Patient not communicative, moves her head in yes and no answers, not apparent pain. Has been tolerating mechanical ventilation and tube feedings.   Objective: Vitals:   05/02/20 1136 05/02/20 1137 05/02/20 1200 05/02/20 1300  BP: 109/65  114/73 113/73  Pulse: 91  91 92  Resp: (!) 25  (!) 25 (!) 22  Temp:      TempSrc:      SpO2: 97% 96% 98% 98%  Weight:      Height:        Intake/Output Summary (Last 24 hours) at 05/02/2020 1456 Last data filed at 05/02/2020 1300 Gross per 24 hour  Intake 1483.85 ml  Output 1741 ml  Net -257.15 ml   Filed Weights   04/30/20 0353 05/01/20 0341 05/02/20 0422  Weight: 90.5 kg 92 kg 92.5 kg    Examination:   General: Not in pain or dyspnea, deconditioned  Neurology: Awake,  right sided hemiparesis. Moves head not clear if  is intentional. Contracted right wrist and hand  E ENT: mild pallor, trach in place.  Cardiovascular: No JVD. S1-S2 present, rhythmic, no gallops, rubs, or murmurs. No lower extremity edema. Pulmonary: positive breath sounds bilaterally,  Gastrointestinal. Abdomen soft, peg tube in place.  Skin.stage 2 sacrum ulcer Musculoskeletal: right AKA, contracted right hand and wrist.      Data Reviewed: I have personally reviewed following labs and imaging studies  CBC: Recent Labs  Lab 04/28/20 0130 04/29/20 0320 04/30/20 0222 05/01/20 0135 05/01/20 1038 05/02/20 0553  WBC 7.9 7.7 7.5 5.6  --  7.1  NEUTROABS  --   --   --   --   --  5.1  HGB 7.1* 7.4* 7.6* 6.9* 8.4* 9.1*  HCT 23.5* 23.5* 24.7* 22.7* 27.2* 27.9*  MCV 95.1 92.9 95.0 94.2  --  90.6  PLT 189 172 170 188  --  962   Basic Metabolic Panel: Recent Labs  Lab 04/26/20 0159 04/27/20 0602 04/28/20 0130 04/29/20 0320 04/29/20 0726 04/29/20 1113 04/30/20 0222 05/01/20 0135 05/01/20 2247 05/02/20 0553  NA 135 134*   < > 136  --   --  135 136 135 136  K 4.9 5.9*   < > 5.3*   < > 4.9 5.0 5.0 5.1 4.6  CL 103 100   < >  99  --   --  100 100 99 98  CO2 23 22   < > 22  --   --  21* '25 23 24  ' GLUCOSE 151* 145*   < > 73  --   --  119* 167* 163* 185*  BUN 102* 109*   < > 116*  --   --  117* 131* 149* 152*  CREATININE 4.64* 4.83*   < > 5.00*  --   --  4.98* 5.07* 4.90* 4.88*  CALCIUM 8.5* 8.7*   < > 9.2  --   --  8.9 8.8* 8.6* 8.8*  MG 1.8 1.9  --   --   --   --   --   --  1.6*  --    < > = values in this interval not displayed.   GFR: Estimated Creatinine Clearance: 11.3 mL/min (A) (by C-G formula based on SCr of 4.88 mg/dL (H)). Liver Function Tests: No results for input(s): AST, ALT, ALKPHOS, BILITOT, PROT, ALBUMIN in the last 168 hours. No results for input(s): LIPASE, AMYLASE in the last 168 hours. No results for input(s): AMMONIA in the last 168 hours. Coagulation Profile: No results for input(s): INR, PROTIME in the  last 168 hours. Cardiac Enzymes: No results for input(s): CKTOTAL, CKMB, CKMBINDEX, TROPONINI in the last 168 hours. BNP (last 3 results) No results for input(s): PROBNP in the last 8760 hours. HbA1C: No results for input(s): HGBA1C in the last 72 hours. CBG: Recent Labs  Lab 05/01/20 1919 05/01/20 2310 05/02/20 0326 05/02/20 0749 05/02/20 1112  GLUCAP 159* 143* 160* 149* 151*   Lipid Profile: No results for input(s): CHOL, HDL, LDLCALC, TRIG, CHOLHDL, LDLDIRECT in the last 72 hours. Thyroid Function Tests: No results for input(s): TSH, T4TOTAL, FREET4, T3FREE, THYROIDAB in the last 72 hours. Anemia Panel: No results for input(s): VITAMINB12, FOLATE, FERRITIN, TIBC, IRON, RETICCTPCT in the last 72 hours.    Radiology Studies: I have reviewed all of the imaging during this hospital visit personally     Scheduled Meds: . amiodarone  200 mg Per Tube Daily  . apixaban  5 mg Per Tube BID  . chlorhexidine gluconate (MEDLINE KIT)  15 mL Mouth Rinse BID  . Chlorhexidine Gluconate Cloth  6 each Topical Daily  . feeding supplement (PROSource TF)  45 mL Per Tube Daily  . fiber  1 packet Per Tube BID  . free water  30 mL Per Tube Q4H  . insulin aspart  0-15 Units Subcutaneous Q4H  . levETIRAcetam  500 mg Per Tube QHS  . mouth rinse  15 mL Mouth Rinse 10 times per day  . midodrine  5 mg Per Tube Q8H  . nutrition supplement (JUVEN)  1 packet Per Tube BID BM  . pantoprazole sodium  40 mg Per Tube Q24H  . QUEtiapine  25 mg Per Tube QHS  . sodium chloride flush  10-40 mL Intracatheter Q12H   Continuous Infusions: . sodium chloride 10 mL/hr at 05/02/20 1100  . feeding supplement (VITAL AF 1.2 CAL) 1,000 mL (05/01/20 1940)  . furosemide Stopped (05/02/20 0109)     LOS: 17 days        Kerrington Sova Gerome Apley, MD

## 2020-05-02 NOTE — Progress Notes (Signed)
[facility] Progress Note Patient Name: Kazumi Mcclammy DOB: May 09, 1940 MRN: [id number]   Date of Service  05/02/2020  HPI/Events of Note  Hypomagnesemia - Mg++ = 1.6 and Creatinine = 4.9.  eICU Interventions  Will cautiously replace Mg++.     Intervention Category Major Interventions: Electrolyte abnormality - evaluation and management  Shakthi Scipio Eugene 05/02/2020, 12:29 AM

## 2020-05-02 NOTE — Progress Notes (Addendum)
This chaplain completed the phone conversation about the Pt. Advance Directive with the Pt. daughter-Ervinda.  The chaplain updated [name] on the Pt. from the chart notes,  the Pt. is awake and alert, communicating yes and no with the medical team.  The chaplain continued education with [name] on the purpose of the Advance Directive and the role of the HCPOA.  The chaplain understands [name] has HCPOA documentation for the Pt. [name] plans to visit the Pt. today and will bring the document to the hospital.  This chaplain will communicate with the Pt. RN to notify the chaplain of Ervinda's arrival.   The purpose of the communication with the chaplain is to meet the Pt. and [name], verify the document, and scan into EMR.  *77 The RN paged the chaplain when the Pt. daughter-Ervinda arrived with the Pt. HCPOA.  The chaplain understands after reading the notarized HCPOA the Pt. has appointed:  1-Ervinda Brunson, 2-Juan Vicente Serene, and Adria Devon to serve as her healthcare agents. "My designated health care agents shall serve alone, in the order named."  The chaplain copied the document and placed one copy of the HCPOA in the chart and scanned a copy to the Pt. EMR.  This chaplain is available for F/U spiritual care as needed.

## 2020-05-02 NOTE — Progress Notes (Signed)
[facility] NEPHROLOGY PROGRESS NOTE  Assessment/ Plan: Pt is a 80 y.o. yo female  with PMH of DM, HFpEF, CVA with right-sided hemiparesis/contracture, s/p right BKA, PEG tube, chronic respiratory failure status post tracheostomy, she was recently at [facility] where treated for HCAP developed A. fib with RVR, hypotension and was sent to ER.  Acute kidney injury: Cr 1 as of 04/15/20 now with severe AKI. Suspect hemodynamically mediated AKI in the setting of A. fib with RVR, IV contrast, recent infection.  UA without proteinuria or microscopic hematuria.  Kidney ultrasound ruled out obstruction.  Not a candidate for long term dialysis in light of her chronic limited function status, comorbidities as well detailed in 04/29/20 note by Dr. Candiss Norse.  - Thankfully there are no indications and her increasing UOP hopefully indicates renal recovery.  Her creatinine has plateaued.  She is awake and alert with acceptable electrolytes and acid/base.  - I had a discussion with her 3/29 about dialysis should indications arise.  She was awake alert and interactive.  She clearly told me no dialysis.  She mouths emphatically "no" and shook her head side to side.  I repeated to her what her wishes were and she nodded in agreement.  I asked her to d/w family when she sees them. - Continue max supportive care.  My hope is her renal function will improve as this is a precipitated AKI.   -daily labs, monitor strict I/O please  Hyperkalemia, improved: stopped osmolite TF's, switched to nepro. Supportive care at the moment. C/w Lasix 161m BID, c/w lokelma 10g PRN, shifting agents prn.  K was 5 this AM.   Hypotension, h/o HTN - on midodrine  Acute on chronic hypoxic respiratory failure, h/o trach: Currently on vent in ICU.  PCCM is following.  Severe sepsis/Healthcare associated pneumonia/septic shock: Treated with broad-spectrum antibiotics per primary team.  Off pressors, on midodrine  A. fib with  RVR: On Eliquis, diltiazem.  Nutrition: She has hypoalbuminemia. Supplement per RD.  Subjective: No acute events. I/Os 2 / 1.5.   Objective Vital signs in last 24 hours: Vitals:   05/02/20 0400 05/02/20 0422 05/02/20 0500 05/02/20 0600  BP: 125/77  (!) 121/57 115/67  Pulse: 93  93 93  Resp: (!) 25  (!) 25 (!) 25  Temp:      TempSrc:      SpO2: 100%  99% 100%  Weight:  92.5 kg    Height:       Weight change: 0.5 kg  Intake/Output Summary (Last 24 hours) at 05/02/2020 0643 Last data filed at 05/02/2020 0600 Gross per 24 hour  Intake 2071.15 ml  Output 1850 ml  Net 221.15 ml       Labs: Basic Metabolic Panel: Recent Labs  Lab 04/30/20 0222 05/01/20 0135 05/01/20 2247  NA 135 136 135  K 5.0 5.0 5.1  CL 100 100 99  CO2 21* 25 23  GLUCOSE 119* 167* 163*  BUN 117* 131* 149*  CREATININE 4.98* 5.07* 4.90*  CALCIUM 8.9 8.8* 8.6*   Liver Function Tests: No results for input(s): AST, ALT, ALKPHOS, BILITOT, PROT, ALBUMIN in the last 168 hours. No results for input(s): LIPASE, AMYLASE in the last 168 hours. No results for input(s): AMMONIA in the last 168 hours. CBC: Recent Labs  Lab 04/27/20 0602 04/28/20 0130 04/29/20 0320 04/30/20 0222 05/01/20 0135 05/01/20 1038  WBC 9.3 7.9 7.7 7.5 5.6  --   HGB 7.6* 7.1* 7.4* 7.6* 6.9* 8.4*  HCT 24.3*  23.5* 23.5* 24.7* 22.7* 27.2*  MCV 92.0 95.1 92.9 95.0 94.2  --   PLT 186 189 172 170 188  --    Cardiac Enzymes: No results for input(s): CKTOTAL, CKMB, CKMBINDEX, TROPONINI in the last 168 hours. CBG: Recent Labs  Lab 05/01/20 1105 05/01/20 1600 05/01/20 1919 05/01/20 2310 05/02/20 0326  GLUCAP 150* 165* 159* 143* 160*    Iron Studies:  No results for input(s): IRON, TIBC, TRANSFERRIN, FERRITIN in the last 72 hours. Studies/Results: No results found.  Medications: Infusions: . sodium chloride 10 mL/hr at 05/01/20 1900  . feeding supplement (VITAL AF 1.2 CAL) 1,000 mL (05/01/20 1940)  . furosemide Stopped  (05/01/20 1804)    Scheduled Medications: . amiodarone  200 mg Per Tube Daily  . apixaban  5 mg Per Tube BID  . chlorhexidine gluconate (MEDLINE KIT)  15 mL Mouth Rinse BID  . Chlorhexidine Gluconate Cloth  6 each Topical Daily  . feeding supplement (PROSource TF)  45 mL Per Tube Daily  . fiber  1 packet Per Tube BID  . free water  30 mL Per Tube Q4H  . insulin aspart  0-15 Units Subcutaneous Q4H  . levETIRAcetam  500 mg Per Tube QHS  . mouth rinse  15 mL Mouth Rinse 10 times per day  . midodrine  5 mg Per Tube Q8H  . nutrition supplement (JUVEN)  1 packet Per Tube BID BM  . pantoprazole sodium  40 mg Per Tube Q24H  . QUEtiapine  25 mg Per Tube QHS  . sodium chloride flush  10-40 mL Intracatheter Q12H    have reviewed scheduled and prn medications.  Physical Exam: General: Chronically ill looking female, on mechanical ventilation  Neck: +trach Heart:RRR, s1s2 nl Lungs: coarse BL, on vent via trach Abdomen:soft,  non-distended Extremities: very trace edema throughout, Contracture of upper extremities, right AKA. Neurology:  Following commands, mouthing yes or no and shaking her head, awake and responsive   Justin Mend 05/02/2020,6:43 AM  LOS: 17 days

## 2020-05-03 DIAGNOSIS — Z515 Encounter for palliative care: Secondary | ICD-10-CM | POA: Diagnosis not present

## 2020-05-03 DIAGNOSIS — J9621 Acute and chronic respiratory failure with hypoxia: Secondary | ICD-10-CM | POA: Diagnosis not present

## 2020-05-03 DIAGNOSIS — R0603 Acute respiratory distress: Secondary | ICD-10-CM

## 2020-05-03 DIAGNOSIS — N179 Acute kidney failure, unspecified: Secondary | ICD-10-CM | POA: Diagnosis not present

## 2020-05-03 DIAGNOSIS — Z7189 Other specified counseling: Secondary | ICD-10-CM | POA: Diagnosis not present

## 2020-05-03 DIAGNOSIS — J9612 Chronic respiratory failure with hypercapnia: Secondary | ICD-10-CM

## 2020-05-03 DIAGNOSIS — J9611 Chronic respiratory failure with hypoxia: Secondary | ICD-10-CM

## 2020-05-03 DIAGNOSIS — I4891 Unspecified atrial fibrillation: Secondary | ICD-10-CM | POA: Diagnosis not present

## 2020-05-03 DIAGNOSIS — R531 Weakness: Secondary | ICD-10-CM

## 2020-05-03 LAB — GLUCOSE, CAPILLARY
Glucose-Capillary: 137 mg/dL — ABNORMAL HIGH (ref 70–99)
Glucose-Capillary: 158 mg/dL — ABNORMAL HIGH (ref 70–99)
Glucose-Capillary: 150 mg/dL — ABNORMAL HIGH (ref 70–99)
Glucose-Capillary: 168 mg/dL — ABNORMAL HIGH (ref 70–99)
Glucose-Capillary: 178 mg/dL — ABNORMAL HIGH (ref 70–99)
Glucose-Capillary: 159 mg/dL — ABNORMAL HIGH (ref 70–99)

## 2020-05-03 LAB — BASIC METABOLIC PANEL
Anion gap: 13 (ref 5–15)
BUN: 157 mg/dL — ABNORMAL HIGH (ref 8–23)
CO2: 24 mmol/L (ref 22–32)
Calcium: 8.6 mg/dL — ABNORMAL LOW (ref 8.9–10.3)
Chloride: 97 mmol/L — ABNORMAL LOW (ref 98–111)
Creatinine, Ser: 4.73 mg/dL — ABNORMAL HIGH (ref 0.44–1.00)
GFR, Estimated: 9 mL/min — ABNORMAL LOW (ref >60)
Glucose, Bld: 151 mg/dL — ABNORMAL HIGH (ref 70–99)
Potassium: 4.4 mmol/L (ref 3.5–5.1)
Sodium: 134 mmol/L — ABNORMAL LOW (ref 135–145)

## 2020-05-03 MED ORDER — FUROSEMIDE 10 MG/ML IJ SOLN
120.0000 mg | Freq: Every day | INTRAVENOUS | Status: DC
  Administered 2020-05-04: 120 mg via INTRAVENOUS
  Filled 2020-05-03: qty 10

## 2020-05-03 MED ORDER — VITAL AF 1.2 CAL PO LIQD
1000.0000 mL | ORAL | Status: DC
  Administered 2020-05-03 – 2020-05-09 (×5): 1000 mL

## 2020-05-03 NOTE — Progress Notes (Signed)
Nutrition Follow-up  DOCUMENTATION CODES:   Not applicable  INTERVENTION:   Continue tube feeding via PEG tube: - Vital AF 1.2 @ 60 ml/hr (1440 ml/day)  Tube feeding regimen provides 1728 kcal, 108 grams of protein, and 1168 ml of H2O.  - 1 packet Juven BID per tube, each packet provides 95 calories, 2.5 grams of protein, and 9.8 grams of carbohydrate; also contains L-arginine and L-glutamine, vitamin C, vitamin E, vitamin B-12, zinc, calcium, and calcium Beta-hydroxy-Beta-methylbutyrate to support wound healing  - Continue Nutrisource Fiber BID per tube  NUTRITION DIAGNOSIS:   Inadequate oral intake related to inability to eat as evidenced by NPO status.  Ongoing  GOAL:   Provide needs based on ASPEN/SCCM guidelines  Met via TF  MONITOR:   Vent status,Labs,Weight trends,TF tolerance,Skin,I & O's  REASON FOR ASSESSMENT:   Consult Assessment of nutrition requirement/status  ASSESSMENT:   80 year old female with history of chronic hypoxic respiratory failure with tracheostomy and PEG tube dependence, aspiration, hypertension, diabetes mellitus type 2, chronic diastolic CHF, seizure disorder, unspecified CVA with right-sided hemiparesis, chronic vegetative state, paroxysmal A. fib recently taken off anticoagulation, pneumonia receiving antibiotic treatment at [facility] prior to presentation presents with A. fib with RVR and HCAP  3/18 - placed on vent support, transferred to ICU  Discussed pt with RN and during ICU rounds. Palliative Care and Ethics team are involved. Per Nephrology note, UOP increasing over last 24 hours on lasix. No urgent need for dialysis at this time per notes.  Admit weight: 78.6 kg Current weight: 94.2 kg  Pt with moderate pitting edema to BUE and BLE.  Current TF: Vital AF 1.2 @ 55 ml/hr, ProSource TF daily, free water flushes of 30 ml q 4 hours  Patient remains on ventilator support via trach MV: 9.6 L/min Temp (24hrs), Avg:97.8 F (36.6 C),  Min:97.4 F (36.3 C), Max:98.2 F (36.8 C)  Medications reviewed and include: nutrisource fiber BID, SSI q 4 hours, Juven BID, protonix, IV lasix  Labs reviewed: sodium 134, hemoglobin 9.1, BUN 157, creatinine 4.73  UOP: 1841 ml x 24 hours Stool: 150 ml x 24 hours I/O's: +14.6 L since admit  Diet Order:   Diet Order            Diet NPO time specified  Diet effective now                 EDUCATION NEEDS:   Not appropriate for education at this time  Skin:  Skin Assessment:  Skin Integrity Issues: Stage II: coccyx Other: necrotic left third toe, right AKA, skin tear to back  Last BM:  05/03/20 150 ml x 24 hours via rectal tube  Height:   Ht Readings from Last 1 Encounters:  04/25/20 '5\' 7"'  (1.702 m)    Weight:   Wt Readings from Last 1 Encounters:  05/03/20 94.2 kg    BMI:  Body mass index is 32.53 kg/m.  Estimated Nutritional Needs:   Kcal:  1700-1900  Protein:  100-115g/day  Fluid:  1.5 L/day    Gustavus Bryant, MS, RD, LDN Inpatient Clinical Dietitian Please see AMiON for contact information.

## 2020-05-03 NOTE — Progress Notes (Signed)
PROGRESS NOTE    Rebecca Harrell  SWH:[id number] DOB: [birth year] DOA: 04/15/2020 PCP: Aaron Edelman, MD    Brief Narrative:  Rebecca Harrell was admitted to the hospital with acute on chronic hypoxic respiratory failure due to aspiration pneumonitis.  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 LTAC resident who was transferred to acute care due to persistent tachycardia for about 48 hours, refractive to intravenous metoprolol. She was unable to give detailed history.  At time of transfer oxygen saturation mid 90s on 8 L/min supplemental oxygen, respiratory rate 30, heart rate 130, blood pressure 86/45.  She had a tracheostomy in place, coarse breath sounds bilaterally, scattered rhonchi with increased work of breathing, heart S1-S2, irregularly irregular, tachycardic, abdomen soft nontender, no lower extremity edema.  Patient was diagnosed with atrial fibrillation with rapid ventricular response, complicated with acute on chronic hypoxic respiratory failure, aspiration pneumonitis.  Admitted to the intensive care unit and placed on broad-spectrum antibiotic therapy. Bronch positive for Pseudomonas, possible colonization. Placed on anticoagulation with heparin drip.  She required 1 unit packed red blood cell transfusion.  Developed intermittent hypotension requiring vasopressor, transition to midodrine.  Tracheostomy change 3/22.  Transferred to [facility] 3/23.  Patient continue with invasive mechanical ventilation per tracheostomy. Developed ATN with worsening renal function.   Not good candidate to renal replacement therapy, with high dose of furosemide her serum cr is now trending down.  Palliative care services have been consulted.     Assessment & Plan:   Principal Problem:   Atrial fibrillation with rapid ventricular response (HCC) Active Problems:   History of CVA (cerebrovascular accident)   Normocytic anemia   Tracheostomy dependence (HCC)   Hypotension   Chronic pulmonary aspiration   Seizure disorder (HCC)   Acute on chronic respiratory failure with hypoxia (HCC)   Pneumonia   Hypertension associated with diabetes ([place])   Acute respiratory distress   AKI (acute kidney injury) ([place])   VRE (vancomycin resistant enterococcus) culture positive   Pressure injury of skin   Chronic respiratory failure ([place])   Palliative care by specialist   Goals of care, counseling/discussion   1. Acute on chronic hypoxemic respiratory failure. Improved mentation today, she has been on mandatory mode PRVC  invasive mechanical ventilation, with peep of 5 and Vt 400, no sedation needed, patient not showing significant dyssynchrony.  Continue as needed fentanyl and trach care per protocol. Patient has a #8 cuffed Shiley due to leak.  Plan to return to [facility], where she can continue with invasive mechanical ventilation.   2. AKI with ATN (non oliguric). Hypokalemia serum cr today is 4,7 down from 4,88 with K at 4,4 and bicarbonate at 24. Continue to have hypervolemia with diffuse pitting edema.  Her urine output over last 24 hrs 1,841  Blood pressure control with midodrine and continue high doses of furosemide (120 mg IV q 12 hrs) to target euvolemia.  Continue close monitoring of renal function and electrolytes, patient not good candidate for renal replacement therapy. If continue recovery of renal function, plan to return to SNF vent facility.   3. Paroxysmal atrial fibrillation. On amiodarone for rate and rhythm control continue with apixaban for anticoagulation  Continue close telemetry monitoring.   4. T2DM patient tolerating well tube feedings, continue insulin sliding scale for glucose cover and  monitoring  5. Anemia  of chronic disease. Continue stable cell count, follow as outpatient.   6. Hx of CVA with right sided hemiparesis/ stage 2 pressure ulcer (present on admission).  Patient with poor communications, right sided hemiparesis, swallow dysfunction and ventilator dependent respiratory failure. Poor prognosis, chronic multiorgan failure.   Cn quetiapine and keppra per feeding tube.  Continue local skin care and frequent turning.   Patient continue to be at high risk for worsening renal failure   Status is: Inpatient  Remains inpatient appropriate because:IV treatments appropriate due to intensity of illness or inability to take PO   Dispo: The patient is from: SNF              Anticipated d/c is to: SNF              Patient currently is not medically stable to d/c.   Difficult to place patient No   DVT prophylaxis: apixaban   Code Status:    full   Family Communication:  No family at the bedside      Nutrition Status: Nutrition Problem: Inadequate oral intake Etiology: inability to eat Signs/Symptoms: NPO status Interventions: Prostat,Tube feeding     Skin Documentation: Pressure Injury 04/20/20 Coccyx Stage 2 -  Partial thickness loss of dermis presenting as a shallow open injury with a red, pink wound bed without slough. (Active)  04/20/20 0300  Location: Coccyx  Location Orientation:   Staging: Stage 2 -  Partial thickness loss of dermis presenting as a shallow open injury with a red, pink wound bed without slough.  Wound Description (Comments):   Present on Admission: Yes     Consultants:   Cardiology   Pulmonary   Palliative Care  Nephrology     Subjective: Patient is more awake and alert than yesterday, no apparent pain or dyspnea, has remained on mechanical ventilation and tolerating well tube feedings.  Most information from nursing at the bedside   Objective: Vitals:   05/03/20 1200 05/03/20 1300 05/03/20 1400 05/03/20 1500   BP: 114/70 121/74 113/73 116/73  Pulse: 95 95 94 95  Resp: (!) 23 (!) 25 (!) 25 (!) 24  Temp:      TempSrc:      SpO2: 100% 100% 99% 100%  Weight:      Height:        Intake/Output Summary (Last 24 hours) at 05/03/2020 1532 Last data filed at 05/03/2020 1524 Gross per 24 hour  Intake 1579.6 ml  Output 1600 ml  Net -20.4 ml   Filed Weights   05/01/20 0341 05/02/20 0422 05/03/20 0359  Weight: 92 kg 92.5 kg 94.2 kg    Examination:   General: deconditioned and ill looking appearing  Neurology: Awake, tracks with her eyes and moves her head for yes and no answers,. E ENT: positive pallor, no icterus, oral mucosa moist Cardiovascular: No JVD. S1-S2 present, rhythmic, no gallops, rubs, or murmurs. Positive pitting ++ bilateral thigh and left leg. Dependent edema,   Pulmonary: positive breath sounds bilaterally, Gastrointestinal. Abdomen soft and non tender Skin. No rashes Musculoskeletal: right AKA. Right arm and wrist contracted.      Data Reviewed: I have personally reviewed following labs and imaging studies  CBC: Recent Labs  Lab 04/28/20 0130 04/29/20 0320 04/30/20 0222 05/01/20 0135 05/01/20 1038 05/02/20 0553  WBC 7.9 7.7 7.5 5.6  --  7.1  NEUTROABS  --   --   --   --   --  5.1  HGB 7.1* 7.4* 7.6* 6.9*  8.4* 9.1*  HCT 23.5* 23.5* 24.7* 22.7* 27.2* 27.9*  MCV 95.1 92.9 95.0 94.2  --  90.6  PLT 189 172 170 188  --  683   Basic Metabolic Panel: Recent Labs  Lab 04/27/20 0602 04/28/20 0130 04/30/20 0222 05/01/20 0135 05/01/20 2247 05/02/20 0553 05/03/20 0555  NA 134*   < > 135 136 135 136 134*  K 5.9*   < > 5.0 5.0 5.1 4.6 4.4  CL 100   < > 100 100 99 98 97*  CO2 22   < > 21* '25 23 24 24  ' GLUCOSE 145*   < > 119* 167* 163* 185* 151*  BUN 109*   < > 117* 131* 149* 152* 157*  CREATININE 4.83*   < > 4.98* 5.07* 4.90* 4.88* 4.73*  CALCIUM 8.7*   < > 8.9 8.8* 8.6* 8.8* 8.6*  MG 1.9  --   --   --  1.6*  --   --    < > = values in this interval not  displayed.   GFR: Estimated Creatinine Clearance: 11.7 mL/min (A) (by C-G formula based on SCr of 4.73 mg/dL (H)). Liver Function Tests: No results for input(s): AST, ALT, ALKPHOS, BILITOT, PROT, ALBUMIN in the last 168 hours. No results for input(s): LIPASE, AMYLASE in the last 168 hours. No results for input(s): AMMONIA in the last 168 hours. Coagulation Profile: No results for input(s): INR, PROTIME in the last 168 hours. Cardiac Enzymes: No results for input(s): CKTOTAL, CKMB, CKMBINDEX, TROPONINI in the last 168 hours. BNP (last 3 results) No results for input(s): PROBNP in the last 8760 hours. HbA1C: No results for input(s): HGBA1C in the last 72 hours. CBG: Recent Labs  Lab 05/02/20 2329 05/03/20 0309 05/03/20 0714 05/03/20 1112 05/03/20 1514  GLUCAP 169* 137* 158* 150* 168*   Lipid Profile: No results for input(s): CHOL, HDL, LDLCALC, TRIG, CHOLHDL, LDLDIRECT in the last 72 hours. Thyroid Function Tests: No results for input(s): TSH, T4TOTAL, FREET4, T3FREE, THYROIDAB in the last 72 hours. Anemia Panel: No results for input(s): VITAMINB12, FOLATE, FERRITIN, TIBC, IRON, RETICCTPCT in the last 72 hours.    Radiology Studies: I have reviewed all of the imaging during this hospital visit personally     Scheduled Meds: . amiodarone  200 mg Per Tube Daily  . apixaban  5 mg Per Tube BID  . chlorhexidine gluconate (MEDLINE KIT)  15 mL Mouth Rinse BID  . Chlorhexidine Gluconate Cloth  6 each Topical Daily  . fiber  1 packet Per Tube BID  . free water  30 mL Per Tube Q4H  . insulin aspart  0-15 Units Subcutaneous Q4H  . levETIRAcetam  500 mg Per Tube QHS  . mouth rinse  15 mL Mouth Rinse 10 times per day  . midodrine  5 mg Per Tube Q8H  . nutrition supplement (JUVEN)  1 packet Per Tube BID BM  . pantoprazole sodium  40 mg Per Tube Q24H  . QUEtiapine  25 mg Per Tube QHS  . sodium chloride flush  10-40 mL Intracatheter Q12H   Continuous Infusions: . sodium  chloride 250 mL (05/03/20 0744)  . feeding supplement (VITAL AF 1.2 CAL) 1,000 mL (05/03/20 1524)  . [START ON 05/04/2020] furosemide       LOS: 18 days        Rebecca Christiana Gerome Apley, MD

## 2020-05-03 NOTE — Progress Notes (Signed)
[facility] NEPHROLOGY PROGRESS NOTE  Assessment/ Plan: Pt is a 80 y.o. yo female  with PMH of DM, HFpEF, CVA with right-sided hemiparesis/contracture, s/p right BKA, PEG tube, chronic respiratory failure status post tracheostomy, she was recently at [facility] where treated for HCAP developed A. fib with RVR, hypotension and was sent to ER.  Acute kidney injury: Cr 1 as of 04/15/20 now with severe AKI. Suspect hemodynamically mediated AKI in the setting of A. fib with RVR, IV contrast, recent infection.  UA without proteinuria or microscopic hematuria.  Kidney ultrasound ruled out obstruction.  Not a candidate for long term dialysis in light of her chronic limited function status, comorbidities as well detailed in 04/29/20 note by Dr. Candiss Norse.  Of note palliative care and ethics have also conversated about these issues, notes in chart.  - Thankfully there are no indications and her increasing UOP hopefully indicates renal recovery.  Her creatinine has plateaued.  She is awake and alert with acceptable electrolytes and acid/base.  -She's about net even over the past 24h with increasing UOP on lasix 120 IV BID; will decrease to daily in setting of increasing UOP and wish to avoid hypovolemia. - I had a discussion with her 3/29 about dialysis should indications arise.  She was awake alert and interactive.  She clearly told me no dialysis.  She mouths emphatically "no" and shook her head side to side.  I repeated to her what her wishes were and she nodded in agreement.  I asked her to d/w family when she sees them. - Continue max supportive care.  My hope is her renal function will improve as this is a precipitated AKI.   -daily labs, monitor strict I/O please  Hyperkalemia, improved: stopped osmolite TF's, switched to nepro. Supportive care at the moment. C/w Lasix 142m BID, c/w lokelma 10g PRN, shifting agents prn.  K was 5 this AM.   Hypotension, h/o HTN - on midodrine  Acute on  chronic hypoxic respiratory failure, h/o trach: Currently on vent in ICU.  PCCM is following.  Severe sepsis/Healthcare associated pneumonia/septic shock: Treated with broad-spectrum antibiotics per primary team.  Off pressors, on midodrine  A. fib with RVR: On Eliquis, diltiazem.  Nutrition: She has hypoalbuminemia. Supplement per RD.  Subjective: No acute events. I/Os 1.7 / 1.9 (UOP 1.8L).  No new issues.   Objective Vital signs in last 24 hours: Vitals:   05/03/20 0600 05/03/20 0731 05/03/20 0732 05/03/20 0746  BP: 121/79 113/70    Pulse: 97 97    Resp: (!) 25 (!) 25    Temp:    97.6 F (36.4 C)  TempSrc:    Oral  SpO2: 99% 100% 100%   Weight:      Height:       Weight change: 1.7 kg  Intake/Output Summary (Last 24 hours) at 05/03/2020 1014 Last data filed at 05/03/2020 0900 Gross per 24 hour  Intake 1239.59 ml  Output 1741 ml  Net -501.41 ml       Labs: Basic Metabolic Panel: Recent Labs  Lab 05/01/20 2247 05/02/20 0553 05/03/20 0555  NA 135 136 134*  K 5.1 4.6 4.4  CL 99 98 97*  CO2 _0 GLUCOSE 163* 185* 151*  BUN 149* 152* 157*  CREATININE 4.90* 4.88* 4.73*  CALCIUM 8.6* 8.8* 8.6*   Liver Function Tests: No results for input(s): AST, ALT, ALKPHOS, BILITOT, PROT, ALBUMIN in the last 168 hours. No results for input(s): LIPASE, AMYLASE in  the last 168 hours. No results for input(s): AMMONIA in the last 168 hours. CBC: Recent Labs  Lab 04/28/20 0130 04/29/20 0320 04/30/20 0222 05/01/20 0135 05/01/20 1038 05/02/20 0553  WBC 7.9 7.7 7.5 5.6  --  7.1  NEUTROABS  --   --   --   --   --  5.1  HGB 7.1* 7.4* 7.6* 6.9* 8.4* 9.1*  HCT 23.5* 23.5* 24.7* 22.7* 27.2* 27.9*  MCV 95.1 92.9 95.0 94.2  --  90.6  PLT 189 172 170 188  --  227   Cardiac Enzymes: No results for input(s): CKTOTAL, CKMB, CKMBINDEX, TROPONINI in the last 168 hours. CBG: Recent Labs  Lab 05/02/20 1532 05/02/20 1918 05/02/20 2329 05/03/20 0309 05/03/20 0714  GLUCAP 139*  129* 169* 137* 158*    Iron Studies:  No results for input(s): IRON, TIBC, TRANSFERRIN, FERRITIN in the last 72 hours. Studies/Results: No results found.  Medications: Infusions: . sodium chloride 250 mL (05/03/20 0744)  . feeding supplement (VITAL AF 1.2 CAL) 1,000 mL (05/02/20 1941)  . furosemide 120 mg (05/03/20 0745)    Scheduled Medications: . amiodarone  200 mg Per Tube Daily  . apixaban  5 mg Per Tube BID  . chlorhexidine gluconate (MEDLINE KIT)  15 mL Mouth Rinse BID  . Chlorhexidine Gluconate Cloth  6 each Topical Daily  . feeding supplement (PROSource TF)  45 mL Per Tube Daily  . fiber  1 packet Per Tube BID  . free water  30 mL Per Tube Q4H  . insulin aspart  0-15 Units Subcutaneous Q4H  . levETIRAcetam  500 mg Per Tube QHS  . mouth rinse  15 mL Mouth Rinse 10 times per day  . midodrine  5 mg Per Tube Q8H  . nutrition supplement (JUVEN)  1 packet Per Tube BID BM  . pantoprazole sodium  40 mg Per Tube Q24H  . QUEtiapine  25 mg Per Tube QHS  . sodium chloride flush  10-40 mL Intracatheter Q12H    have reviewed scheduled and prn medications.  Physical Exam: General: Chronically ill looking female, on mechanical ventilation  Neck: +trach Heart:RRR, s1s2 nl Lungs: coarse BL, on vent via trach Abdomen:soft,  non-distended Extremities: 1+  edema throughout, Contracture of upper extremities, right AKA. Neurology:  Sleeping, localizes to me briefly but not interacting today as she has previously    A  05/03/2020,10:14 AM  LOS: 18 days   

## 2020-05-03 NOTE — Progress Notes (Signed)
Daily Progress Note   Patient Name: Rebecca Harrell       Date: 05/03/2020 DOB: 11/30/1940  Age: 80 y.o. MRN#: [id number] Attending Physician: Tawni Millers Primary Care Physician: Aaron Edelman, MD Admit Date: 04/15/2020  Reason for Consultation/Follow-up: Establishing goals of care  Subjective:  Patient is resting in bed, not alert this morning, attempts to open eyes when name is called.   Discussed with bedside nursing staff, also discussed with chaplain colleague, appreciate their care of the patient, see below.   Length of Stay: 18  Current Medications: Scheduled Meds:  . amiodarone  200 mg Per Tube Daily  . apixaban  5 mg Per Tube BID  . chlorhexidine gluconate (MEDLINE KIT)  15 mL Mouth Rinse BID  . Chlorhexidine Gluconate Cloth  6 each Topical Daily  . feeding supplement (PROSource TF)  45 mL Per Tube Daily  . fiber  1 packet Per Tube BID  . free water  30 mL Per Tube Q4H  . insulin aspart  0-15 Units Subcutaneous Q4H  . levETIRAcetam  500 mg Per Tube QHS  . mouth rinse  15 mL Mouth Rinse 10 times per day  . midodrine  5 mg Per Tube Q8H  . nutrition supplement (JUVEN)  1 packet Per Tube BID BM  . pantoprazole sodium  40 mg Per Tube Q24H  . QUEtiapine  25 mg Per Tube QHS  . sodium chloride flush  10-40 mL Intracatheter Q12H    Continuous Infusions: . sodium chloride 250 mL (05/03/20 0744)  . feeding supplement (VITAL AF 1.2 CAL) 1,000 mL (05/02/20 1941)  . furosemide 120 mg (05/03/20 0745)    PRN Meds: acetaminophen, dextrose, fentaNYL (SUBLIMAZE) injection, ipratropium-albuterol, ondansetron (ZOFRAN) IV, oxyCODONE, sodium chloride flush  Physical Exam         Patient has a trach and PEG Appears with contractures and generalized weakness Not  awake not alert today, attempts to open her eyes when name is called No distress Coarse breath sounds    Vital Signs: BP 113/70   Pulse 97   Temp 97.6 F (36.4 C) (Oral)   Resp (!) 25   Ht _0  (1.702 m)   Wt 94.2 kg   SpO2 100%   BMI 32.53 kg/m  SpO2: SpO2: 100 % O2 Device: O2 Device: Ventilator O2  Flow Rate: O2 Flow Rate (L/min): 40 L/min  Intake/output summary:   Intake/Output Summary (Last 24 hours) at 05/03/2020 0941 Last data filed at 05/03/2020 0900 Gross per 24 hour  Intake 1341.33 ml  Output 1741 ml  Net -399.67 ml   LBM: Last BM Date: 05/02/20 Baseline Weight: Weight: 77.1 kg Most recent weight: Weight: 94.2 kg      PPS 40% Palliative Assessment/Data:    Flowsheet Rows   Flowsheet Row Most Recent Value  Intake Tab   Referral Department Hospitalist  Unit at Time of Referral Cardiac/Telemetry Unit  Palliative Care Primary Diagnosis Pulmonary  Date Notified 04/19/20  Palliative Care Type New Palliative care  Reason for referral Clarify Goals of Care  Date of Admission 04/15/20  Date first seen by Palliative Care 04/20/20  # of days Palliative referral response time 1 Day(s)  # of days IP prior to Palliative referral 4  Clinical Assessment   Psychosocial & Spiritual Assessment   Palliative Care Outcomes       Patient Active Problem List   Diagnosis Date Noted  . Chronic respiratory failure ([place])   . Palliative care by specialist   . Goals of care, counseling/discussion   . Pressure injury of skin 04/20/2020  . Acute respiratory distress   . AKI (acute kidney injury) ([place])   . VRE (vancomycin resistant enterococcus) culture positive   . Hypertension associated with diabetes ([place])   . Acute pulmonary edema (HCC)   . Atrial fibrillation with rapid ventricular response ([place]) 04/15/2020  . History of CVA (cerebrovascular accident) 04/15/2020  . Normocytic anemia 04/15/2020  . Tracheostomy dependence ([place]) 04/15/2020  . Hypotension 04/15/2020  .  Chronic pulmonary aspiration 04/15/2020  . Seizure disorder ([place]) 04/15/2020  . Acute on chronic respiratory failure with hypoxia ([place]) 04/15/2020  . Pneumonia 04/15/2020    Palliative Care Assessment & Plan   Patient Profile:  Pt is a 80 y.o. yo female  with PMH of DM, HFpEF, CVA with right-sided hemiparesis/contracture, s/p right BKA, PEG tube, chronic respiratory failure status post tracheostomy, she was recently at [facility] where treated for HCAP developed A. fib with RVR, hypotension and was sent to ER.  Assessment:  PMT consulted for goals of care discussions, discussions regarding her renal condition and issues related to dialysis.   Recommendations/Plan:   discussed with chaplain, advance care planning documents reviewed, has HCPOA, chaplain Dorian Pod discussed with daughter [name] at bedside. Discussed with Dorian Pod.   Renal following, no new recommendations from PMT, continue current mode of care.     Code Status:    Code Status Orders  (From admission, onward)         Start     Ordered   04/15/20 2347  Full code  Continuous        04/15/20 2350        Code Status History    This patient has a current code status but no historical code status.   Advance Care Planning Activity       Prognosis:   guarded   Discharge Planning:  To Be Determined  Care plan was discussed with  Patient and IDT.   Thank you for allowing the Palliative Medicine Team to assist in the care of this patient.   Time In: 9 Time Out: 9.25 Total Time 25 Prolonged Time Billed No       Greater than 50%  of this time was spent counseling and coordinating care related to the above assessment and plan.  Loistine Chance, MD  Please contact Palliative Medicine Team phone at [phone] for questions and concerns.

## 2020-05-04 DIAGNOSIS — I4891 Unspecified atrial fibrillation: Secondary | ICD-10-CM | POA: Diagnosis not present

## 2020-05-04 DIAGNOSIS — N179 Acute kidney failure, unspecified: Secondary | ICD-10-CM | POA: Diagnosis not present

## 2020-05-04 DIAGNOSIS — J9621 Acute and chronic respiratory failure with hypoxia: Secondary | ICD-10-CM | POA: Diagnosis not present

## 2020-05-04 LAB — GLUCOSE, CAPILLARY
Glucose-Capillary: 154 mg/dL — ABNORMAL HIGH (ref 70–99)
Glucose-Capillary: 156 mg/dL — ABNORMAL HIGH (ref 70–99)
Glucose-Capillary: 171 mg/dL — ABNORMAL HIGH (ref 70–99)
Glucose-Capillary: 174 mg/dL — ABNORMAL HIGH (ref 70–99)
Glucose-Capillary: 202 mg/dL — ABNORMAL HIGH (ref 70–99)
Glucose-Capillary: 207 mg/dL — ABNORMAL HIGH (ref 70–99)

## 2020-05-04 LAB — BASIC METABOLIC PANEL
Anion gap: 13 (ref 5–15)
BUN: 169 mg/dL — ABNORMAL HIGH (ref 8–23)
CO2: 24 mmol/L (ref 22–32)
Calcium: 8.5 mg/dL — ABNORMAL LOW (ref 8.9–10.3)
Chloride: 96 mmol/L — ABNORMAL LOW (ref 98–111)
Creatinine, Ser: 4.68 mg/dL — ABNORMAL HIGH (ref 0.44–1.00)
GFR, Estimated: 9 mL/min — ABNORMAL LOW (ref >60)
Glucose, Bld: 182 mg/dL — ABNORMAL HIGH (ref 70–99)
Potassium: 5 mmol/L (ref 3.5–5.1)
Sodium: 133 mmol/L — ABNORMAL LOW (ref 135–145)

## 2020-05-04 LAB — MAGNESIUM: Magnesium: 1.9 mg/dL (ref 1.7–2.4)

## 2020-05-04 MED ORDER — METOPROLOL TARTRATE 5 MG/5ML IV SOLN
5.0000 mg | Freq: Four times a day (QID) | INTRAVENOUS | Status: AC | PRN
  Administered 2020-05-04 – 2020-05-05 (×2): 5 mg via INTRAVENOUS
  Filled 2020-05-04: qty 5

## 2020-05-04 MED ORDER — METOPROLOL TARTRATE 5 MG/5ML IV SOLN
INTRAVENOUS | Status: AC
  Filled 2020-05-04: qty 5

## 2020-05-04 MED ORDER — MAGNESIUM SULFATE IN D5W 1-5 GM/100ML-% IV SOLN
1.0000 g | Freq: Once | INTRAVENOUS | Status: AC
  Administered 2020-05-04: 1 g via INTRAVENOUS
  Filled 2020-05-04: qty 100

## 2020-05-04 MED ORDER — METOPROLOL TARTRATE 5 MG/5ML IV SOLN: 5.0000 mg | Freq: Four times a day (QID) | INTRAVENOUS | Status: DC | PRN

## 2020-05-04 MED ORDER — GUAIFENESIN 100 MG/5ML PO SOLN
5.0000 mL | Freq: Two times a day (BID) | ORAL | Status: DC
  Administered 2020-05-04 – 2020-05-14 (×13): 100 mg
  Filled 2020-05-04 (×13): qty 10

## 2020-05-04 MED ORDER — NUTRISOURCE FIBER PO PACK
1.0000 | PACK | Freq: Three times a day (TID) | ORAL | Status: DC
  Administered 2020-05-04 – 2020-05-05 (×5): 1
  Filled 2020-05-04 (×9): qty 1

## 2020-05-04 MED ORDER — METOPROLOL TARTRATE 5 MG/5ML IV SOLN
5.0000 mg | Freq: Once | INTRAVENOUS | Status: AC
  Administered 2020-05-04: 5 mg via INTRAVENOUS
  Filled 2020-05-04: qty 5

## 2020-05-04 MED ORDER — ALBUMIN HUMAN 25 % IV SOLN
25.0000 g | Freq: Four times a day (QID) | INTRAVENOUS | Status: AC
  Administered 2020-05-04 – 2020-05-05 (×4): 25 g via INTRAVENOUS
  Filled 2020-05-04 (×4): qty 100

## 2020-05-04 NOTE — Progress Notes (Signed)
PROGRESS NOTE    Rebecca Harrell  ZYY:[id number] DOB: [birth year] DOA: 04/15/2020 PCP: Aaron Edelman, MD    Brief Narrative:  Rebecca Harrell was admitted to the hospital with acute on chronic hypoxic respiratory failure due to aspiration pneumonitis.  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 LTAC resident who was transferred to acute care due to persistent tachycardia for about 48 hours, refractive to intravenous metoprolol. She was unable to give detailed history.  At time of transfer oxygen saturation mid 90s on 8 L/min supplemental oxygen, respiratory rate 30, heart rate 130, blood pressure 86/45.  She had a tracheostomy in place, coarse breath sounds bilaterally, scattered rhonchi with increased work of breathing, heart S1-S2, irregularly irregular, tachycardic, abdomen soft nontender, no lower extremity edema.  Patient was diagnosed with atrial fibrillation with rapid ventricular response, complicated with acute on chronic hypoxic respiratory failure, aspiration pneumonitis.  Admitted to the intensive care unit and placed on broad-spectrum antibiotic therapy. Bronch positive for Pseudomonas, possible colonization. Placed on anticoagulation with heparin drip.  She required 1 unit packed red blood cell transfusion.  Developed intermittent hypotension requiring vasopressor, transition to midodrine.  Tracheostomy change 3/22.  Transferred to [facility] 3/23.  Patient continue with invasive mechanical ventilation per tracheostomy. Developed ATN with worsening renal function.   Not good candidate to renal replacement therapy, with high dose of furosemide her serum cr is now trending down.  Palliative care services have been  consulted.  05/04/2020: Available records reviewed.  Discussed with the nephrologist, Dr. Johnney Ou, ICU team, Dr. Elsworth Soho and the ethics committee representative, Dr. Verneita Griffes.  Patient has nonoliguric, severe AKI, likely secondary to ATN.  Patient is making urine but remains volume overloaded.  ICU team reported difficulty when inpatient.  Potassium is high normal.  BUN is 169.  Last albumin was done on 04/24/2020, was 1.9.  Discussed option of IV albumin, alongside IV Lasix with the nephrology team.  We will continue to assess patient daily.  Continue to monitor renal function, volume status and electrolytes.  Dose all medications considering significantly impaired renal function.  Long-term prognosis is guarded.   Assessment & Plan:   Principal Problem:   Atrial fibrillation with rapid ventricular response (HCC) Active Problems:   History of CVA (cerebrovascular accident)   Normocytic anemia   Tracheostomy dependence (HCC)   Hypotension   Chronic pulmonary aspiration   Seizure disorder (HCC)   Acute on chronic respiratory failure with hypoxia (HCC)   Pneumonia   Hypertension associated with diabetes ([place])   Acute respiratory distress   AKI (acute kidney injury) ([place])   VRE (vancomycin resistant enterococcus) culture positive   Pressure injury of skin   Chronic respiratory failure ([place])   Palliative care by specialist   Goals of care, counseling/discussion   1. Acute on chronic hypoxemic respiratory failure. Improved mentation today, she has been on mandatory mode PRVC  invasive mechanical ventilation, with peep of 5 and Vt 400, no sedation needed, patient not showing significant dyssynchrony.  Continue as needed fentanyl and trach care per protocol. Patient has a #8 cuffed Sh[place] due to leak.  Plan to return to [facility], where she can continue with invasive mechanical ventilation.  05/04/2020: ICU team is trying to wean patient off of the vent.  As per ICU team, weaning the patient  will be challenging.  Optimization of volume status may help with weaning.    2. AKI with ATN (non oliguric): Continue to have hypervolemia with diffuse pitting edema.  Her urine output over last 24 hrs 1,841  Blood pressure control with midodrine and continue high doses of furosemide (120 mg IV q 12 hrs) to target euvolemia.  Continue close monitoring of renal function and electrolytes, patient not good candidate for renal replacement therapy. If continue recovery of renal function, plan to return to SNF vent facility.  05/04/2020: Serum creatinine is stable at 4.68 today.  BUN is 169.  I agree with IV albumin.  May consider dosing Lasix 2-3 times daily versus continuous Lasix drip, will defer to the nephrology team.    3. Paroxysmal atrial fibrillation. On amiodarone for rate and rhythm control continue with apixaban for anticoagulation  Continue close telemetry monitoring.   4. T2DM patient tolerating well tube feedings, continue insulin sliding scale for glucose cover and monitoring  5. Anemia of chronic disease. Continue stable cell count, follow as outpatient.   6. Hx of CVA with right sided hemiparesis/ stage 2 pressure ulcer (present on admission).  Continue quetiapine and keppra per feeding tube.  Continue local skin care and frequent turning.   Status is: Inpatient  Remains inpatient appropriate because:IV treatments appropriate due to intensity of illness or inability to take PO   Dispo: The patient is from: SNF              Anticipated d/c is to: SNF              Patient currently is not medically stable to d/c.   Difficult to place patient No   DVT prophylaxis: apixaban   Code Status:    full   Family Communication:  No family at the bedside      Nutrition Status: Nutrition Problem: Inadequate oral intake Etiology: inability to eat Signs/Symptoms: NPO status Interventions: Prostat,Tube feeding     Skin Documentation: Pressure Injury 04/20/20 Coccyx  Stage 2 -  Partial thickness loss of dermis presenting as a shallow open injury with a red, pink wound bed without slough. (Active)  04/20/20 0300  Location: Coccyx  Location Orientation:   Staging: Stage 2 -  Partial thickness loss of dermis presenting as a shallow open injury with a red, pink wound bed without slough.  Wound Description (Comments):   Present on Admission: Yes     Consultants:   Cardiology   Pulmonary   Palliative Care  Nephrology     Subjective: Patient is not able to give any significant history.  Patient follows command.   Objective: Vitals:   05/04/20 0900 05/04/20 1000 05/04/20 1100 05/04/20 1108  BP: 122/78 126/71 124/77 124/77  Pulse: 95 95 94 95  Resp: (!) 25 (!) 24 (!) 23 (!) 24  Temp:    98.1 F (36.7 C)  TempSrc:    Oral  SpO2: 95% 97% 96% 93%  Weight:      Height:        Intake/Output Summary (Last 24 hours) at 05/04/2020 1200 Last data filed at 05/04/2020 1100 Gross per 24 hour  Intake 1989.38 ml  Output 1260 ml  Net 729.38 ml   Filed Weights   05/02/20 0422 05/03/20 0359 05/04/20 0500  Weight: 92.5 kg 94.2 kg 93 kg    Examination:   General: Awake.  Follows simple commands.  Not in any distress.  No significant history from the patient.   HEENT: Patient is pale.  No  jaundice. Neurology: Awake and alert.  Follows simple command.  Right upper extremity is contracted.  Patient is status post right AKA.  Seems to move the left side.  Cardiovascular: S1-S2.  Pulmonary: Decreased air entry. Gastrointestinal. Abdomen obese, soft and non tender.  Organs are difficult to assess. Extremities: Patient is status post right AKA.  Left lower extremity edema.  Data Reviewed: I have personally reviewed following labs and imaging studies  CBC: Recent Labs  Lab 04/28/20 0130 04/29/20 0320 04/30/20 0222 05/01/20 0135 05/01/20 1038 05/02/20 0553  WBC 7.9 7.7 7.5 5.6  --  7.1  NEUTROABS  --   --   --   --   --  5.1  HGB 7.1* 7.4* 7.6*  6.9* 8.4* 9.1*  HCT 23.5* 23.5* 24.7* 22.7* 27.2* 27.9*  MCV 95.1 92.9 95.0 94.2  --  90.6  PLT 189 172 170 188  --  354   Basic Metabolic Panel: Recent Labs  Lab 05/01/20 0135 05/01/20 2247 05/02/20 0553 05/03/20 0555 05/04/20 0024  NA 136 135 136 134* 133*  K 5.0 5.1 4.6 4.4 5.0  CL 100 99 98 97* 96*  CO2 _0 GLUCOSE 167* 163* 185* 151* 182*  BUN 131* 149* 152* 157* 169*  CREATININE 5.07* 4.90* 4.88* 4.73* 4.68*  CALCIUM 8.8* 8.6* 8.8* 8.6* 8.5*  MG  --  1.6*  --   --  1.9   GFR: Estimated Creatinine Clearance: 11.8 mL/min (A) (by C-G formula based on SCr of 4.68 mg/dL (H)). Liver Function Tests: No results for input(s): AST, ALT, ALKPHOS, BILITOT, PROT, ALBUMIN in the last 168 hours. No results for input(s): LIPASE, AMYLASE in the last 168 hours. No results for input(s): AMMONIA in the last 168 hours. Coagulation Profile: No results for input(s): INR, PROTIME in the last 168 hours. Cardiac Enzymes: No results for input(s): CKTOTAL, CKMB, CKMBINDEX, TROPONINI in the last 168 hours. BNP (last 3 results) No results for input(s): PROBNP in the last 8760 hours. HbA1C: No results for input(s): HGBA1C in the last 72 hours. CBG: Recent Labs  Lab 05/03/20 1514 05/03/20 1915 05/03/20 2329 05/04/20 0329 05/04/20 0725  GLUCAP 168* 178* 159* 156* 154*   Lipid Profile: No results for input(s): CHOL, HDL, LDLCALC, TRIG, CHOLHDL, LDLDIRECT in the last 72 hours. Thyroid Function Tests: No results for input(s): TSH, T4TOTAL, FREET4, T3FREE, THYROIDAB in the last 72 hours. Anemia Panel: No results for input(s): VITAMINB12, FOLATE, FERRITIN, TIBC, IRON, RETICCTPCT in the last 72 hours.    Radiology Studies: I have reviewed all of the imaging during this hospital visit personally     Scheduled Meds: . amiodarone  200 mg Per Tube Daily  . apixaban  5 mg Per Tube BID  . chlorhexidine gluconate (MEDLINE KIT)  15 mL Mouth Rinse BID  . Chlorhexidine Gluconate  Cloth  6 each Topical Daily  . fiber  1 packet Per Tube TID  . guaiFENesin  5 mL Per Tube Q12H  . insulin aspart  0-15 Units Subcutaneous Q4H  . levETIRAcetam  500 mg Per Tube QHS  . mouth rinse  15 mL Mouth Rinse 10 times per day  . midodrine  5 mg Per Tube Q8H  . nutrition supplement (JUVEN)  1 packet Per Tube BID BM  . pantoprazole sodium  40 mg Per Tube Q24H  . QUEtiapine  25 mg Per Tube QHS  . sodium chloride flush  10-40 mL Intracatheter Q12H   Continuous Infusions: . sodium chloride  10 mL/hr at 05/04/20 1100  . albumin human    . feeding supplement (VITAL AF 1.2 CAL) 1,000 mL (05/04/20 0927)  . furosemide Stopped (05/04/20 1014)  . magnesium sulfate bolus IVPB       LOS: 19 days        Bonnell Public, MD

## 2020-05-04 NOTE — TOC Progression Note (Signed)
Transition of Care [facility]) - Progression Note    Patient Details  Name: Rebecca Harrell MRN: [id number] Date of Birth: 04/14/1940  Transition of Care [facility]) CM/SW Contact  Bartholomew Crews, RN Phone Number: [phone] 05/04/2020, 5:20 PM  Clinical Narrative:     Notified by Doris Cheadle that patient is candidate for LTAC. MD notified - patient not stable for transition today. Patient can transition over the weekend if becomes stable. TOC following for transition needs.        Expected Discharge Plan and Services                                                 Social Determinants of Health (SDOH) Interventions    Readmission Risk Interventions No flowsheet data found.

## 2020-05-04 NOTE — TOC Progression Note (Signed)
Transition of Care [facility]) - Progression Note    Patient Details  Name: Rebecca Harrell MRN: [id number] Date of Birth: May 19, 1940  Transition [facility] Care [facility]) CM/SW Contact  Bartholomew Crews, RN Phone Number: [phone] 05/04/2020, 12:12 PM  Clinical Narrative:     Damaris Schooner with MD during board rounds - is patient eligible for LTAC? Referral to [facility] pending.       Expected Discharge Plan and Services                                                 Social Determinants of Health (SDOH) Interventions    Readmission Risk Interventions No flowsheet data found.

## 2020-05-04 NOTE — Progress Notes (Signed)
Pt went in to Afib with heart rates in the 120's and 130's. Patient mouthed and nodded "No" that she was not in pain. 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 Printed copy placed in paitents chart. Paged [facility] MD, awaiting orders, continuing to monitor closely.

## 2020-05-04 NOTE — Progress Notes (Signed)
TR[facility] night shift.  The nursing staff reports that the patient is having atrial fibrillation with RVR in the 140s and 150s.  The patient had a similar episode early morning today that was controlled with metoprolol 5 mg IVP x1 dose.  Her most recent BP is 136/83 mmHg.  Metoprolol 5 mg IVP every 6 hours as needed for tachycardia x2 doses ordered.  Tennis Must, MD.

## 2020-05-04 NOTE — Progress Notes (Addendum)
NAME:  Rebecca Harrell, MRN:  [id number], DOB:  11/15/1940, LOS: 79 ADMISSION DATE:  04/15/2020, CONSULTATION DATE:  04/16/2020 REFERRING MD:  Dr. Myna Hidalgo, [facility] CHIEF COMPLAINT:  04/16/2020  Brief History:  66 yoF with chronic respiratory failure s/p trach sent from [facility] for Afib with RVR.  Patient is also being treated for pneumonia on zosyn pta.  CXR concerning for possible partially loculated effusion.  Pulmonary consulted for further evaluation.   Past Medical History:  HTN, DM, Chronic diastolic CHF, CVA with Rt side weakness, Seizure, Aspiration pneumonia, s/p trach and PEG, PAF  Significant Hospital Events:  3/13 Admitted [facility] 3/15 on trach collar 5L 28% 3/17 Worsening hypoxemia and increased secretions 3/18 PCCM called to see patient overnight secondary to increasing work of breathing and hypoxia secondary to possible aspiration event 3/19 air leak from trach >> improved after changing to pressure support; change to heparin gtt 3/20 transfuse 1 unit PRBC; off pressors 3/21 back on pressors; start midodrine and reduce cardizem dose. ATC tolerated for 7hrs 3/22 Trach changed to 8 Shiley cuffed due to leak  3/23 Some issues with trach bleeding post exchange but improved this AM.[facility] assumed as primary care, PCCM on for vent and trach care   Consults:  Cardiology Palliative care Nephrology  Procedures:   Trach exchanged 3/22 and 8 cuffed shiley placed   Significant Diagnostic Tests:   3/13 CTA PE >> pulmonary edema, small b/p effusions, small loculated component in minor fissure and oblique fissure  3/13 CT abd/ pelvis >> No acute findings within the abdomen.  3/16 renal u/s >> cholelithiasis, normal kidneys  Micro Data:  3/13 BCx>>negative 3/13 SARS 2/ flu >> negative 3/13 UCx >> 100k Vanc resistant enterococcus 3/14 MRSA >> negative 3/17 sputum >> Pseudomonas aeruginosa (resistant to fortaz and imipenem)  Antimicrobials:  Vancomycin 3/13 >> 3/14 Zosyn 3/13>>>off   Zithromax 3/13 >> 3/17 Linezolid 3/15 >> 3/17 Cefepime 3/13 >> 3/19  Interim History / Subjective:  Per RT secretions are yellow, no blood 05/04/2020 am Eyes are open, no following commands No significant change in condition Notes/ethics consult reviewed , appreciate Palliative care assist Remains on full vent support, not tol weaning efforts  Converted to A fib with RVR overnight, rate into the 140-150's Primary team are checking a Mag level Mucus Plug 4/1 am T max 99.2, No CBC since 3/30   Objective   Blood pressure 110/82, pulse 93, temperature 99 F (37.2 C), temperature source Oral, resp. rate (!) 25, height '5\' 7"'$  (1.702 m), weight 93 kg, SpO2 100 %.    Vent Mode: PRVC FiO2 (%):  [40 %] 40 % Set Rate:  [25 bmp] 25 bmp Vt Set:  [400 mL] 400 mL PEEP:  [5 cmH20] 5 cmH20 Plateau Pressure:  [22 cmH20-33 cmH20] 33 cmH20   Intake/Output Summary (Last 24 hours) at 05/04/2020 0811 Last data filed at 05/04/2020 0700 Gross per 24 hour  Intake 2027.44 ml  Output 1260 ml  Net 767.44 ml   Filed Weights   05/02/20 0422 05/03/20 0359 05/04/20 0500  Weight: 92.5 kg 94.2 kg 93 kg    Physical Exam General: chronically ill appearing female, NAD on vent  HEENT: 8 cuffed Shiley trach clean and dry, Oropharynx otherwise clear.Secretions are yellow, no blood Neuro: somnolent but opens eyes, nods occasionally, tracks at times CV: Regular, S1, S2, RRR but tachy , overnight a fib rates to 140-150, no rate is 95 and rate is regular, no murmur rub or gallop PULM:Bilateral chest excursion,  resps even non labored on vent, no increased work of breathing, full support , thick yellow secretions with mucus plug this 4/1 am per RT, lavaged with  clearance GI: distended, NT,  positive bowel sounds, TF running Extremities: Warm, dry, no edema, right AKA Skin: No rash, warm and dry  Resolved Hospital Problem list   VRE UTI, Aspiration pneumonitis with drug resistant Pseudomonas aeruginosa  Assessment  & Plan:   Acute on chronic hypoxic respiratory failure from aspiration pneumonitis. Tracheostomy status. Trach site bleeding after upsize 8.0 Shiley, resolved Secretions with frequent suctioning Mucus plug 4/1>> Low grade temp P: Vent support - 8cc/kg  F/u CXR intermittently F/u ABG as needed  Continue attempts at PS wean - not tolerating currently  Pulmonary hygiene standard trach care VAP prevention orders Will add Mucinex  solution Q 12 as mucolytic Continue goals of care discussions - at this point would likely need to return to vent SNF when ready for placement   Rest of acute and chronic medical conditions including but not limited to:are not managed per [facility] Hypotension from hypovolemia and sedation. Loculated pleural effusions in minor fissure and oblique fissure. Atrial fibrillation Hx of CVA with Rt hemiparesis Seizure disorder Vegetative state AKI from ATN 2nd to hypoxia and hypotension. Hyperkalemia  Positive fluid balance  Anemia of critical illness and chronic disease. 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 Goals of care Pressure injury  PCCM will continue to manage vent and trach 1-2x/week, call if needed sooner   Best practice (evaluated daily)  Diet: tube feeds DVT prophylaxis: heparin gtt GI prophylaxis: protonix Mobility: bed rest Disposition: ICU Code Status: full code  Labs    CMP Latest Ref Rng & Units 05/04/2020 05/03/2020 05/02/2020  Glucose 70 - 99 mg/dL 182(H) 151(H) 185(H)  BUN 8 - 23 mg/dL 169(H) 157(H) 152(H)  Creatinine 0.44 - 1.00 mg/dL 4.68(H) 4.73(H) 4.88(H)  Sodium 135 - 145 mmol/L 133(L) 134(L) 136  Potassium 3.5 - 5.1 mmol/L 5.0 4.4 4.6  Chloride 98 - 111 mmol/L 96(L) 97(L) 98  CO2 22 - 32 mmol/L '24 24 24  '$ Calcium 8.9 - 10.3 mg/dL 8.5(L) 8.6(L) 8.8(L)  Total Protein 6.5 - 8.1 g/dL - - -  Total Bilirubin 0.3 - 1.2 mg/dL - - -  Alkaline Phos 38 - 126 U/L - - -  AST 15 - 41 U/L - - -  ALT 0 - 44 U/L - - -    CBC Latest  Ref Rng & Units 05/02/2020 05/01/2020 05/01/2020  WBC 4.0 - 10.5 K/uL 7.1 - 5.6  Hemoglobin 12.0 - 15.0 g/dL 9.1(L) 8.4(L) 6.9(LL)  Hematocrit 36.0 - 46.0 % 27.9(L) 27.2(L) 22.7(L)  Platelets 150 - 400 K/uL 227 - 188    ABG    Component Value Date/Time   PHART 7.297 (L) 04/20/2020 1022   PCO2ART 51.8 (H) 04/20/2020 1022   PO2ART 335 (H) 04/20/2020 1022   HCO3 25.3 04/20/2020 1022   TCO2 27 04/20/2020 1022   ACIDBASEDEF 1.0 04/20/2020 1022   O2SAT 100.0 04/20/2020 1022    CBG (last 3)  Recent Labs    05/03/20 2329 05/04/20 0329 05/04/20 0725  GLUCAP 159* 156* 154*    Signature:    Magdalen Spatz, MSN, AGACNP-BC [place] for personal pager PCCM on call pager per Edward [place]  05/04/2020  8:11 AM

## 2020-05-04 NOTE — Progress Notes (Signed)
[facility] night shift.  The staff reports that the patient is currently in atrial fibrillation with RVR in the 120s and 130s.  The patient is getting amiodarone 200 mg per tube daily.  Her home amlodipine has been held.  She has been on midodrine 5 mg per 2 daily and received furosemide 120 mg IVPB earlier today.  Her most recent BP was 146/78 mmHg.  Metoprolol 5 mg IVP x1 dose ordered.  BMP is pending.  She was hypomagnesemic a few nights ago.  I will add a magnesium level.  Tennis Must, MD.

## 2020-05-04 NOTE — Progress Notes (Signed)
[facility] NEPHROLOGY PROGRESS NOTE  Assessment/ Plan: Pt is a 80 y.o. yo female  with PMH of DM, HFpEF, CVA with right-sided hemiparesis/contracture, s/p right BKA, PEG tube, chronic respiratory failure status post tracheostomy, she was recently at [facility] where treated for HCAP developed A. fib with RVR, hypotension and was sent to ER.  Acute kidney injury: Cr 1 as of 04/15/20 now with severe AKI. Suspect hemodynamically mediated AKI in the setting of A. fib with RVR, IV contrast, recent infection.  UA without proteinuria or microscopic hematuria.  Kidney ultrasound ruled out obstruction.  Not a candidate for long term dialysis in light of her chronic limited function status, comorbidities as well detailed in 04/29/20 note by Dr. Candiss Norse.  Of note palliative care and ethics have also conversated about these issues, notes in chart.  - Thankfully there are no indications and her increasing UOP hopefully indicates renal recovery.  Her creatinine has plateaued.  She is awake and alert with acceptable electrolytes and acid/base.  Her mental status seems about the same despite the elevated BUN.  -With rising BUN will give albumin today and hold this PM dose of lasix.  -Should she develop indications for RRT we could offer a time limited trial of HD but that's not currently needed - I had a discussion with her 3/29 about dialysis should indications arise.  She was awake alert and interactive.  She clearly told me no dialysis.  She mouths emphatically "no" and shook her head side to side.  I repeated to her what her wishes were and she nodded in agreement.  I asked her to d/w family when she sees them. - Continue max supportive care.  My hope is her renal function will improve as this is a precipitated AKI.   -daily labs, monitor strict I/O please  Hyperkalemia, improved: stopped osmolite TF's, switched to nepro. Supportive care at the moment. C/w Lasix 163m BID, c/w lokelma 10g PRN,  shifting agents prn.  K was 5 this AM.   Hypotension, h/o HTN - on midodrine  Acute on chronic hypoxic respiratory failure, h/o trach: Currently on vent in ICU.  PCCM is following.  Severe sepsis/Healthcare associated pneumonia/septic shock: Treated with broad-spectrum antibiotics per primary team.  Off pressors, on midodrine  A. fib with RVR: On Eliquis, diltiazem.  Nutrition: She has hypoalbuminemia. Supplement per RD.  Subjective: No acute events. I/Os 1.7 / 1.9 (UOP 1.8L).  No new issues.   Objective Vital signs in last 24 hours: Vitals:   05/04/20 1200 05/04/20 1300 05/04/20 1400 05/04/20 1500  BP: 128/76 123/78 (!) 125/95 (!) 125/95  Pulse: 94 93 95 94  Resp: (!) 23 (!) 23 (!) 24 (!) 25  Temp:    98.7 F (37.1 C)  TempSrc:    Oral  SpO2: 94% 93% 94% 95%  Weight:      Height:       Weight change: -1.2 kg  Intake/Output Summary (Last 24 hours) at 05/04/2020 1616 Last data filed at 05/04/2020 1500 Gross per 24 hour  Intake 2006.57 ml  Output 1260 ml  Net 746.57 ml       Labs: Basic Metabolic Panel: Recent Labs  Lab 05/02/20 0553 05/03/20 0555 05/04/20 0024  NA 136 134* 133*  K 4.6 4.4 5.0  CL 98 97* 96*  CO2 _0 GLUCOSE 185* 151* 182*  BUN 152* 157* 169*  CREATININE 4.88* 4.73* 4.68*  CALCIUM 8.8* 8.6* 8.5*   Liver Function Tests: No  results for input(s): AST, ALT, ALKPHOS, BILITOT, PROT, ALBUMIN in the last 168 hours. No results for input(s): LIPASE, AMYLASE in the last 168 hours. No results for input(s): AMMONIA in the last 168 hours. CBC: Recent Labs  Lab 04/28/20 0130 04/29/20 0320 04/30/20 0222 05/01/20 0135 05/01/20 1038 05/02/20 0553  WBC 7.9 7.7 7.5 5.6  --  7.1  NEUTROABS  --   --   --   --   --  5.1  HGB 7.1* 7.4* 7.6* 6.9* 8.4* 9.1*  HCT 23.5* 23.5* 24.7* 22.7* 27.2* 27.9*  MCV 95.1 92.9 95.0 94.2  --  90.6  PLT 189 172 170 188  --  227   Cardiac Enzymes: No results for input(s): CKTOTAL, CKMB, CKMBINDEX, TROPONINI in the  last 168 hours. CBG: Recent Labs  Lab 05/03/20 2329 05/04/20 0329 05/04/20 0725 05/04/20 1157 05/04/20 1518  GLUCAP 159* 156* 154* 207* 202*    Iron Studies:  No results for input(s): IRON, TIBC, TRANSFERRIN, FERRITIN in the last 72 hours. Studies/Results: No results found.  Medications: Infusions: . sodium chloride 10 mL/hr at 05/04/20 1500  . albumin human 60 mL/hr at 05/04/20 1500  . feeding supplement (VITAL AF 1.2 CAL) 1,000 mL (05/04/20 0927)    Scheduled Medications: . amiodarone  200 mg Per Tube Daily  . apixaban  5 mg Per Tube BID  . chlorhexidine gluconate (MEDLINE KIT)  15 mL Mouth Rinse BID  . Chlorhexidine Gluconate Cloth  6 each Topical Daily  . fiber  1 packet Per Tube TID  . guaiFENesin  5 mL Per Tube Q12H  . insulin aspart  0-15 Units Subcutaneous Q4H  . levETIRAcetam  500 mg Per Tube QHS  . mouth rinse  15 mL Mouth Rinse 10 times per day  . midodrine  5 mg Per Tube Q8H  . nutrition supplement (JUVEN)  1 packet Per Tube BID BM  . pantoprazole sodium  40 mg Per Tube Q24H  . QUEtiapine  25 mg Per Tube QHS  . sodium chloride flush  10-40 mL Intracatheter Q12H    have reviewed scheduled and prn medications.  Physical Exam: General: Chronically ill looking female, on mechanical ventilation  Neck: +trach Heart:RRR, s1s2 nl Lungs: coarse BL, on vent via trach Abdomen:soft,  non-distended Extremities: 1+  edema throughout, Contracture of upper extremities, right AKA. Neurology:  Sleeping, localizes to me briefly but not interacting today as she has previously   Justin Mend 05/04/2020,4:16 PM  LOS: 19 days

## 2020-05-05 ENCOUNTER — Inpatient Hospital Stay (HOSPITAL_COMMUNITY): Payer: Medicare Other

## 2020-05-05 DIAGNOSIS — I4891 Unspecified atrial fibrillation: Secondary | ICD-10-CM | POA: Diagnosis not present

## 2020-05-05 LAB — GLUCOSE, CAPILLARY
Glucose-Capillary: 149 mg/dL — ABNORMAL HIGH (ref 70–99)
Glucose-Capillary: 156 mg/dL — ABNORMAL HIGH (ref 70–99)
Glucose-Capillary: 182 mg/dL — ABNORMAL HIGH (ref 70–99)
Glucose-Capillary: 226 mg/dL — ABNORMAL HIGH (ref 70–99)
Glucose-Capillary: 227 mg/dL — ABNORMAL HIGH (ref 70–99)
Glucose-Capillary: 241 mg/dL — ABNORMAL HIGH (ref 70–99)

## 2020-05-05 LAB — CBC WITH DIFFERENTIAL/PLATELET
Abs Immature Granulocytes: 0.03 10*3/uL (ref 0.00–0.07)
Basophils Absolute: 0 10*3/uL (ref 0.0–0.1)
Basophils Relative: 0 %
Eosinophils Absolute: 0.4 10*3/uL (ref 0.0–0.5)
Eosinophils Relative: 5 %
HCT: 24.5 % — ABNORMAL LOW (ref 36.0–46.0)
Hemoglobin: 7.5 g/dL — ABNORMAL LOW (ref 12.0–15.0)
Immature Granulocytes: 1 %
Lymphocytes Relative: 8 %
Lymphs Abs: 0.5 10*3/uL — ABNORMAL LOW (ref 0.7–4.0)
MCH: 28.8 pg (ref 26.0–34.0)
MCHC: 30.6 g/dL (ref 30.0–36.0)
MCV: 94.2 fL (ref 80.0–100.0)
Monocytes Absolute: 0.3 10*3/uL (ref 0.1–1.0)
Monocytes Relative: 5 %
Neutro Abs: 5.4 10*3/uL (ref 1.7–7.7)
Neutrophils Relative %: 81 %
Platelets: 187 10*3/uL (ref 150–400)
RBC: 2.6 MIL/uL — ABNORMAL LOW (ref 3.87–5.11)
RDW: 17.7 % — ABNORMAL HIGH (ref 11.5–15.5)
WBC: 6.6 10*3/uL (ref 4.0–10.5)
nRBC: 0 % (ref 0.0–0.2)

## 2020-05-05 LAB — BASIC METABOLIC PANEL
Anion gap: 16 — ABNORMAL HIGH (ref 5–15)
BUN: 182 mg/dL — ABNORMAL HIGH (ref 8–23)
CO2: 21 mmol/L — ABNORMAL LOW (ref 22–32)
Calcium: 8.9 mg/dL (ref 8.9–10.3)
Chloride: 98 mmol/L (ref 98–111)
Creatinine, Ser: 4.51 mg/dL — ABNORMAL HIGH (ref 0.44–1.00)
GFR, Estimated: 10 mL/min — ABNORMAL LOW (ref >60)
Glucose, Bld: 181 mg/dL — ABNORMAL HIGH (ref 70–99)
Potassium: 5.1 mmol/L (ref 3.5–5.1)
Sodium: 135 mmol/L (ref 135–145)

## 2020-05-05 LAB — ALBUMIN: Albumin: 3.4 g/dL — ABNORMAL LOW (ref 3.5–5.0)

## 2020-05-05 MED ORDER — METOPROLOL TARTRATE 5 MG/5ML IV SOLN
5.0000 mg | Freq: Four times a day (QID) | INTRAVENOUS | Status: AC | PRN
  Administered 2020-05-05 – 2020-05-06 (×2): 5 mg via INTRAVENOUS
  Filled 2020-05-05 (×2): qty 5

## 2020-05-05 NOTE — Progress Notes (Signed)
[facility] NEPHROLOGY PROGRESS NOTE  Assessment/ Plan: Pt is a 80 y.o. yo female  with PMH of DM, HFpEF, CVA with right-sided hemiparesis/contracture, s/p right BKA, PEG tube, chronic respiratory failure status post tracheostomy, she was recently at [facility] where treated for HCAP developed A. fib with RVR, hypotension and was sent to ER.  Acute kidney injury: Cr 1 as of 04/15/20 now with severe AKI. Suspect hemodynamically mediated AKI in the setting of A. fib with RVR, IV contrast, recent infection.  UA without proteinuria or microscopic hematuria.  Kidney ultrasound ruled out obstruction.   -Her UOP was improving through the week with stable Cr but in the setting of worsening azotemia was given a trial of albumin volume expansion yesterday without improvement.  -I am attempting to be in contact with HCPOA today to discussion options -- I would recommend against HD but a time limited trial of dialysis could be considered given this is AKI.   - Continue max supportive care.  My hope is her renal function will improve as this is a precipitated AKI.   -daily labs, monitor strict I/O please -Not a candidate for long term dialysis in light of her chronic limited function status, comorbidities as well detailed in 04/29/20 note by Dr. Candiss Norse.  Of note palliative care and ethics have also conversated about these issues, notes in chart.   Hyperkalemia, improved: stopped osmolite TF's, switched to nepro. Supportive care at the moment. C/w Lasix 146m BID, c/w lokelma 10g PRN, shifting agents prn.  K was 5.1 this AM.   Hypotension, h/o HTN - on midodrine with normal BP  Acute on chronic hypoxic respiratory failure, h/o trach: Currently on vent in ICU.  PCCM is following.  Severe sepsis/Healthcare associated pneumonia/septic shock: Treated with broad-spectrum antibiotics per primary team.  Off pressors, on midodrine  A. fib with RVR: On Eliquis, diltiazem.  Rec'd MTP IV once  overnight  Malnutrition:  Supplement per RD.  Subjective: No acute events. I/Os 2.3 / 0.6.  Afib with RVR overnight.  Improved with metoprolol 531mIV x 1.    Objective Vital signs in last 24 hours: Vitals:   05/05/20 0800 05/05/20 0900 05/05/20 1000 05/05/20 1119  BP: 137/87 133/80 128/76 127/83  Pulse: 95 95 98 94  Resp: (!) 23 (!) 23 (!) 24 (!) 24  Temp:      TempSrc:      SpO2: 92% 94% 93% 98%  Weight:      Height:       Weight change: 1 kg  Intake/Output Summary (Last 24 hours) at 05/05/2020 1258 Last data filed at 05/05/2020 1002 Gross per 24 hour  Intake 1811.63 ml  Output 950 ml  Net 861.63 ml       Labs: Basic Metabolic Panel: Recent Labs  Lab 05/03/20 0555 05/04/20 0024 05/05/20 0105  NA 134* 133* 135  K 4.4 5.0 5.1  CL 97* 96* 98  CO2 24 24 21*  GLUCOSE 151* 182* 181*  BUN 157* 169* 182*  CREATININE 4.73* 4.68* 4.51*  CALCIUM 8.6* 8.5* 8.9   Liver Function Tests: Recent Labs  Lab 05/05/20 0105  ALBUMIN 3.4*   No results for input(s): LIPASE, AMYLASE in the last 168 hours. No results for input(s): AMMONIA in the last 168 hours. CBC: Recent Labs  Lab 04/29/20 0320 04/30/20 0222 05/01/20 0135 05/01/20 1038 05/02/20 0553 05/05/20 0105  WBC 7.7 7.5 5.6  --  7.1 6.6  NEUTROABS  --   --   --   --  5.1 5.4  HGB 7.4* 7.6* 6.9* 8.4* 9.1* 7.5*  HCT 23.5* 24.7* 22.7* 27.2* 27.9* 24.5*  MCV 92.9 95.0 94.2  --  90.6 94.2  PLT 172 170 188  --  227 187   Cardiac Enzymes: No results for input(s): CKTOTAL, CKMB, CKMBINDEX, TROPONINI in the last 168 hours. CBG: Recent Labs  Lab 05/04/20 1908 05/04/20 2344 05/05/20 0336 05/05/20 0718 05/05/20 1115  GLUCAP 174* 171* 182* 149* 156*    Iron Studies:  No results for input(s): IRON, TIBC, TRANSFERRIN, FERRITIN in the last 72 hours. Studies/Results: DG CHEST PORT 1 VIEW  Result Date: 05/05/2020 CLINICAL DATA:  Respiratory failure, history of CVA EXAM: PORTABLE CHEST 1 VIEW COMPARISON:  04/21/2020  chest radiograph. FINDINGS: Tracheostomy tube tip overlies the tracheal air column at the thoracic inlet. Stable cardiomediastinal silhouette with top-normal heart size. No pneumothorax. Patient's right upper extremity obscures the lateral right lung base. No significant pleural effusions. Similar hazy bibasilar lung opacities. No overt pulmonary edema. IMPRESSION: 1. Similar hazy bibasilar lung opacities, favor atelectasis, difficult to exclude a component of aspiration or pneumonia. 2. Tracheostomy tube appears well-positioned. Electronically Signed   By: Ilona Sorrel M.D.   On: 05/05/2020 08:09    Medications: Infusions: . sodium chloride 10 mL/hr at 05/05/20 0700  . feeding supplement (VITAL AF 1.2 CAL) 1,000 mL (05/05/20 0012)    Scheduled Medications: . amiodarone  200 mg Per Tube Daily  . apixaban  5 mg Per Tube BID  . chlorhexidine gluconate (MEDLINE KIT)  15 mL Mouth Rinse BID  . Chlorhexidine Gluconate Cloth  6 each Topical Daily  . fiber  1 packet Per Tube TID  . guaiFENesin  5 mL Per Tube Q12H  . insulin aspart  0-15 Units Subcutaneous Q4H  . levETIRAcetam  500 mg Per Tube QHS  . mouth rinse  15 mL Mouth Rinse 10 times per day  . midodrine  5 mg Per Tube Q8H  . nutrition supplement (JUVEN)  1 packet Per Tube BID BM  . pantoprazole sodium  40 mg Per Tube Q24H  . QUEtiapine  25 mg Per Tube QHS  . sodium chloride flush  10-40 mL Intracatheter Q12H    have reviewed scheduled and prn medications.  Physical Exam: General: Chronically ill looking female, on mechanical ventilation  Neck: +trach Heart:RRR, s1s2 nl Lungs: coarse BL, on vent via trach Abdomen:soft,  non-distended Extremities: 1+  edema throughout, Contracture of upper extremities, right AKA. Neurology:  Sleeping, localizes to me briefly but not interacting today as she has previously   Justin Mend 05/05/2020,12:58 PM  LOS: 20 days

## 2020-05-05 NOTE — Progress Notes (Signed)
PROGRESS NOTE    Brilyn Tuller  VZD:[id number] DOB: Jul 10, 1940 DOA: 04/15/2020 PCP: Aaron Edelman, MD    Brief Narrative:  Mrs. Yzaguirre was admitted to the hospital with acute on chronic hypoxic respiratory failure due to aspiration pneumonitis.  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 LTAC resident who was transferred to acute care due to persistent tachycardia for about 48 hours, refractive to intravenous metoprolol. She was unable to give detailed history.  At time of transfer oxygen saturation mid 90s on 8 L/min supplemental oxygen, respiratory rate 30, heart rate 130, blood pressure 86/45.  She had a tracheostomy in place, coarse breath sounds bilaterally, scattered rhonchi with increased work of breathing, heart S1-S2, irregularly irregular, tachycardic, abdomen soft nontender, no lower extremity edema.  Patient was diagnosed with atrial fibrillation with rapid ventricular response, complicated with acute on chronic hypoxic respiratory failure, aspiration pneumonitis.  Admitted to the intensive care unit and placed on broad-spectrum antibiotic therapy. Bronch positive for Pseudomonas, possible colonization. Placed on anticoagulation with heparin drip.  She required 1 unit packed red blood cell transfusion.  Developed intermittent hypotension requiring vasopressor, transition to midodrine.  Tracheostomy change 3/22.  Transferred to [facility] 3/23.  Patient continue with invasive mechanical ventilation per tracheostomy. Developed ATN with worsening renal function.   Not good candidate to renal replacement therapy, with high dose of furosemide her serum cr is now trending down.  Palliative care services have been  consulted.  05/04/2020: Available records reviewed.  Discussed with the nephrologist, Dr. Johnney Ou, ICU team, Dr. Elsworth Soho and the ethics committee representative, Dr. Verneita Griffes.  Patient has nonoliguric, severe AKI, likely secondary to ATN.  Patient is making urine but remains volume overloaded.  ICU team reported difficulty when inpatient.  Potassium is high normal.  BUN is 169.  Last albumin was done on 04/24/2020, was 1.9.  Discussed option of IV albumin, alongside IV Lasix with the nephrology team.  We will continue to assess patient daily.  Continue to monitor renal function, volume status and electrolytes.  Dose all medications considering significantly impaired renal function.  Long-term prognosis is guarded.  05/23/2020: Patient remains volume overloaded.  Worsening BUN.  Discussed with nephrology team, for trial of temporary hemodialysis (volume and clearance).  Poor long-term prognosis.  Assessment & Plan:   Principal Problem:   Atrial fibrillation with rapid ventricular response (HCC) Active Problems:   History of CVA (cerebrovascular accident)   Normocytic anemia   Tracheostomy dependence (HCC)   Hypotension   Chronic pulmonary aspiration   Seizure disorder (HCC)   Acute on chronic respiratory failure with hypoxia (HCC)   Pneumonia   Hypertension associated with diabetes ([place])   Acute respiratory distress   AKI (acute kidney injury) ([place])   VRE (vancomycin resistant enterococcus) culture positive   Pressure injury of skin   Chronic respiratory failure ([place])   Palliative care by specialist   Goals of care, counseling/discussion   1. Acute on chronic hypoxemic respiratory failure. Improved mentation today, she has been on mandatory mode PRVC  invasive mechanical ventilation, with peep of 5 and Vt 400, no sedation needed, patient not showing significant dyssynchrony.  Continue as needed fentanyl and trach care per protocol. Patient has a #8 cuffed Shiley due to leak.  Plan to  return to [facility], where she can continue with invasive mechanical ventilation.  05/04/2020: ICU team is trying to wean patient off of the vent.  As per ICU team, weaning the patient will be challenging.  Optimization of volume status may help with weaning. 05/05/2020: Hopefully, optimization of volume will assist weaning trials.  2. AKI with ATN (non oliguric): Continue to have hypervolemia with diffuse pitting edema.  Her urine output over last 24 hrs 1,841  Blood pressure control with midodrine and continue high doses of furosemide (120 mg IV q 12 hrs) to target euvolemia.  Continue close monitoring of renal function and electrolytes, patient not good candidate for renal replacement therapy. If continue recovery of renal function, plan to return to SNF vent facility.  05/04/2020: Serum creatinine is stable at 4.68 today.  BUN is 169.  I agree with IV albumin.  May consider dosing Lasix 2-3 times daily versus continuous Lasix drip, will defer to the nephrology team. 05/05/2020: For trial of temporary hemodialysis (volume and clearance).  Nephrology input is highly appreciated.  3. Paroxysmal atrial fibrillation. On amiodarone for rate and rhythm control continue with apixaban for anticoagulation  Continue close telemetry monitoring.   4. T2DM patient tolerating well tube feedings, continue insulin sliding scale for glucose cover and monitoring  5. Anemia of chronic disease. Continue stable cell count, follow as outpatient.   6. Hx of CVA with right sided hemiparesis/ stage 2 pressure ulcer (present on admission).  Continue quetiapine and keppra per feeding tube.  Continue local skin care and frequent turning.   Status is: Inpatient  Remains inpatient appropriate because:IV treatments appropriate due to intensity of illness or inability to take PO   Dispo: The patient is from: SNF              Anticipated d/c is to: SNF              Patient currently is not medically stable to  d/c.   Difficult to place patient No   DVT prophylaxis: apixaban   Code Status:    full   Family Communication:  No family at the bedside      Nutrition Status: Nutrition Problem: Inadequate oral intake Etiology: inability to eat Signs/Symptoms: NPO status Interventions: Prostat,Tube feeding     Skin Documentation: Pressure Injury 04/20/20 Coccyx Stage 2 -  Partial thickness loss of dermis presenting as a shallow open injury with a red, pink wound bed without slough. (Active)  04/20/20 0300  Location: Coccyx  Location Orientation:   Staging: Stage 2 -  Partial thickness loss of dermis presenting as a shallow open injury with a red, pink wound bed without slough.  Wound Description (Comments):   Present on Admission: Yes     Consultants:   Cardiology   Pulmonary   Palliative Care  Nephrology     Subjective: Patient is not able to give any significant history.  Patient follows command.   Objective: Vitals:   05/05/20 1400 05/05/20 1500 05/05/20 1529 05/05/20 1600  BP: 129/77 129/75 129/75 131/75  Pulse: 95 95 94 (!) 116  Resp: (!) 23 (!) 26 (!) 26 (!) 29  Temp:  98 F (36.7 C)    TempSrc:  Axillary    SpO2: 93% 94% 94% 93%  Weight:      Height:        Intake/Output Summary (Last 24 hours) at 05/05/2020 1815 Last data filed at 05/05/2020 1600 Gross per 24 hour  Intake 1907.48 ml  Output 700 ml  Net 1207.48 ml   Filed Weights   05/03/20  1505 05/04/20 0500 05/05/20 0343  Weight: 94.2 kg 93 kg 94 kg    Examination:   General: Awake.  Follows simple commands.  Not in any distress.  No significant history from the patient.   HEENT: Patient is pale.  No jaundice. Neurology: Awake and alert.  Follows simple command.  Right upper extremity is contracted.  Patient is status post right AKA.  Seems to move the left side.  Cardiovascular: S1-S2.  Pulmonary: Decreased air entry. Gastrointestinal. Abdomen obese, soft and non tender.  Organs are difficult to  assess. Extremities: Patient is status post right AKA.  Left lower extremity edema.  Data Reviewed: I have personally reviewed following labs and imaging studies  CBC: Recent Labs  Lab 04/29/20 0320 04/30/20 0222 05/01/20 0135 05/01/20 1038 05/02/20 0553 05/05/20 0105  WBC 7.7 7.5 5.6  --  7.1 6.6  NEUTROABS  --   --   --   --  5.1 5.4  HGB 7.4* 7.6* 6.9* 8.4* 9.1* 7.5*  HCT 23.5* 24.7* 22.7* 27.2* 27.9* 24.5*  MCV 92.9 95.0 94.2  --  90.6 94.2  PLT 172 170 188  --  227 697   Basic Metabolic Panel: Recent Labs  Lab 05/01/20 2247 05/02/20 0553 05/03/20 0555 05/04/20 0024 05/05/20 0105  NA 135 136 134* 133* 135  K 5.1 4.6 4.4 5.0 5.1  CL 99 98 97* 96* 98  CO2 _0 21*  GLUCOSE 163* 185* 151* 182* 181*  BUN 149* 152* 157* 169* 182*  CREATININE 4.90* 4.88* 4.73* 4.68* 4.51*  CALCIUM 8.6* 8.8* 8.6* 8.5* 8.9  MG 1.6*  --   --  1.9  --    GFR: Estimated Creatinine Clearance: 12.3 mL/min (A) (by C-G formula based on SCr of 4.51 mg/dL (H)). Liver Function Tests: Recent Labs  Lab 05/05/20 0105  ALBUMIN 3.4*   No results for input(s): LIPASE, AMYLASE in the last 168 hours. No results for input(s): AMMONIA in the last 168 hours. Coagulation Profile: No results for input(s): INR, PROTIME in the last 168 hours. Cardiac Enzymes: No results for input(s): CKTOTAL, CKMB, CKMBINDEX, TROPONINI in the last 168 hours. BNP (last 3 results) No results for input(s): PROBNP in the last 8760 hours. HbA1C: No results for input(s): HGBA1C in the last 72 hours. CBG: Recent Labs  Lab 05/04/20 2344 05/05/20 0336 05/05/20 0718 05/05/20 1115 05/05/20 1545  GLUCAP 171* 182* 149* 156* 241*   Lipid Profile: No results for input(s): CHOL, HDL, LDLCALC, TRIG, CHOLHDL, LDLDIRECT in the last 72 hours. Thyroid Function Tests: No results for input(s): TSH, T4TOTAL, FREET4, T3FREE, THYROIDAB in the last 72 hours. Anemia Panel: No results for input(s): VITAMINB12, FOLATE, FERRITIN,  TIBC, IRON, RETICCTPCT in the last 72 hours.    Radiology Studies: I have reviewed all of the imaging during this hospital visit personally     Scheduled Meds: . amiodarone  200 mg Per Tube Daily  . chlorhexidine gluconate (MEDLINE KIT)  15 mL Mouth Rinse BID  . Chlorhexidine Gluconate Cloth  6 each Topical Daily  . fiber  1 packet Per Tube TID  . guaiFENesin  5 mL Per Tube Q12H  . insulin aspart  0-15 Units Subcutaneous Q4H  . levETIRAcetam  500 mg Per Tube QHS  . mouth rinse  15 mL Mouth Rinse 10 times per day  . midodrine  5 mg Per Tube Q8H  . nutrition supplement (JUVEN)  1 packet Per Tube BID BM  . pantoprazole sodium  40  mg Per Tube Q24H  . QUEtiapine  25 mg Per Tube QHS  . sodium chloride flush  10-40 mL Intracatheter Q12H   Continuous Infusions: . sodium chloride Stopped (05/05/20 1003)  . feeding supplement (VITAL AF 1.2 CAL) 60 mL/hr at 05/05/20 1600     LOS: 20 days        Bonnell Public, MD

## 2020-05-05 NOTE — TOC Progression Note (Signed)
Transition of Care [facility]) - Progression Note    Patient Details  Name: Rebecca Harrell MRN: [id number] Date of Birth: [birth year]  Transition of Care [facility]) CM/SW Contact  Carles Collet, RN Phone Number: 05/05/2020, 12:27 PM  Clinical Narrative:    Notified by Janeth Rase [facility] that they have a bed for the patient today if MD feels she is stable for DC to [facility]

## 2020-05-05 NOTE — Progress Notes (Signed)
TRH night shift.  The nursing staff reports that the patient is having atrial fibrillation with RVRwith HR at 128 BPM.  The patient had two similar episodes yesterday that responded to metoprolol 5 mg IVP x1 dose.  Her most recent BP is 134/83 mmHg.  Metoprolol 5 mg IVP every 6 hours as needed for tachycardia x2 doses ordered.  Tennis Must, MD.

## 2020-05-06 ENCOUNTER — Inpatient Hospital Stay (HOSPITAL_COMMUNITY): Payer: Medicare Other

## 2020-05-06 DIAGNOSIS — I4891 Unspecified atrial fibrillation: Secondary | ICD-10-CM | POA: Diagnosis not present

## 2020-05-06 DIAGNOSIS — N179 Acute kidney failure, unspecified: Secondary | ICD-10-CM | POA: Diagnosis not present

## 2020-05-06 LAB — RENAL FUNCTION PANEL
Albumin: 3.5 g/dL (ref 3.5–5.0)
Anion gap: 17 — ABNORMAL HIGH (ref 5–15)
BUN: 209 mg/dL — ABNORMAL HIGH (ref 8–23)
CO2: 21 mmol/L — ABNORMAL LOW (ref 22–32)
Calcium: 9.3 mg/dL (ref 8.9–10.3)
Chloride: 97 mmol/L — ABNORMAL LOW (ref 98–111)
Creatinine, Ser: 4.66 mg/dL — ABNORMAL HIGH (ref 0.44–1.00)
GFR, Estimated: 9 mL/min — ABNORMAL LOW (ref >60)
Glucose, Bld: 247 mg/dL — ABNORMAL HIGH (ref 70–99)
Phosphorus: 6.8 mg/dL — ABNORMAL HIGH (ref 2.5–4.6)
Potassium: 4.8 mmol/L (ref 3.5–5.1)
Sodium: 135 mmol/L (ref 135–145)

## 2020-05-06 LAB — CBC WITH DIFFERENTIAL/PLATELET
Abs Immature Granulocytes: 0.03 10*3/uL (ref 0.00–0.07)
Basophils Absolute: 0 10*3/uL (ref 0.0–0.1)
Basophils Relative: 0 %
Eosinophils Absolute: 0.1 10*3/uL (ref 0.0–0.5)
Eosinophils Relative: 2 %
HCT: 26.4 % — ABNORMAL LOW (ref 36.0–46.0)
Hemoglobin: 8.3 g/dL — ABNORMAL LOW (ref 12.0–15.0)
Immature Granulocytes: 0 %
Lymphocytes Relative: 10 %
Lymphs Abs: 0.8 10*3/uL (ref 0.7–4.0)
MCH: 29.7 pg (ref 26.0–34.0)
MCHC: 31.4 g/dL (ref 30.0–36.0)
MCV: 94.6 fL (ref 80.0–100.0)
Monocytes Absolute: 0.7 10*3/uL (ref 0.1–1.0)
Monocytes Relative: 9 %
Neutro Abs: 6.2 10*3/uL (ref 1.7–7.7)
Neutrophils Relative %: 79 %
Platelets: 217 10*3/uL (ref 150–400)
RBC: 2.79 MIL/uL — ABNORMAL LOW (ref 3.87–5.11)
RDW: 17.8 % — ABNORMAL HIGH (ref 11.5–15.5)
WBC: 7.9 10*3/uL (ref 4.0–10.5)
nRBC: 0 % (ref 0.0–0.2)

## 2020-05-06 LAB — GLUCOSE, CAPILLARY
Glucose-Capillary: 222 mg/dL — ABNORMAL HIGH (ref 70–99)
Glucose-Capillary: 213 mg/dL — ABNORMAL HIGH (ref 70–99)
Glucose-Capillary: 182 mg/dL — ABNORMAL HIGH (ref 70–99)
Glucose-Capillary: 159 mg/dL — ABNORMAL HIGH (ref 70–99)
Glucose-Capillary: 133 mg/dL — ABNORMAL HIGH (ref 70–99)

## 2020-05-06 MED ORDER — METOPROLOL TARTRATE 5 MG/5ML IV SOLN
5.0000 mg | Freq: Four times a day (QID) | INTRAVENOUS | Status: AC | PRN
  Administered 2020-05-07: 5 mg via INTRAVENOUS
  Filled 2020-05-06: qty 5

## 2020-05-06 MED ORDER — HEPARIN SODIUM (PORCINE) 1000 UNIT/ML DIALYSIS
1000.0000 [IU] | INTRAMUSCULAR | Status: DC | PRN
  Administered 2020-05-06: 2600 [IU] via INTRAVENOUS_CENTRAL
  Filled 2020-05-06 (×2): qty 6
  Filled 2020-05-06: qty 3
  Filled 2020-05-06: qty 4

## 2020-05-06 MED ORDER — METOPROLOL TARTRATE 5 MG/5ML IV SOLN
INTRAVENOUS | Status: AC
  Administered 2020-05-06: 5 mg via INTRAVENOUS
  Filled 2020-05-06: qty 5

## 2020-05-06 MED ORDER — HEPARIN SOD (PORK) LOCK FLUSH 1 UNIT/ML IV SOLN: 0.5000 mL | INTRAVENOUS | Status: DC | PRN

## 2020-05-06 MED ORDER — CHLORHEXIDINE GLUCONATE CLOTH 2 % EX PADS
6.0000 | MEDICATED_PAD | Freq: Every day | CUTANEOUS | Status: DC
  Administered 2020-05-07 – 2020-05-09 (×2): 6 via TOPICAL

## 2020-05-06 MED ORDER — PANTOPRAZOLE SODIUM 40 MG IV SOLR
40.0000 mg | INTRAVENOUS | Status: DC
  Administered 2020-05-07 – 2020-05-08 (×2): 40 mg via INTRAVENOUS
  Filled 2020-05-06 (×2): qty 40

## 2020-05-06 MED ORDER — HEPARIN SOD (PORK) LOCK FLUSH 10 UNIT/ML IV SOLN
10.0000 [IU] | Freq: Once | INTRAVENOUS | Status: DC
  Filled 2020-05-06: qty 1

## 2020-05-06 MED ORDER — LEVETIRACETAM IN NACL 500 MG/100ML IV SOLN
500.0000 mg | INTRAVENOUS | Status: DC
  Administered 2020-05-06 – 2020-05-13 (×8): 500 mg via INTRAVENOUS
  Filled 2020-05-06 (×9): qty 100

## 2020-05-06 NOTE — Progress Notes (Signed)
CPT held at this time due to pt being nauseous and vomiting

## 2020-05-06 NOTE — Progress Notes (Signed)
[facility] NEPHROLOGY PROGRESS NOTE  Assessment/ Plan: Pt is a 80 y.o. yo female  with PMH of DM, HFpEF, CVA with right-sided hemiparesis/contracture, s/p right BKA, PEG tube, chronic respiratory failure status post tracheostomy, she was recently at [facility] where treated for HCAP developed A. fib with RVR, hypotension and was sent to ER.  Acute kidney injury: Cr 1 as of 04/15/20 now with severe AKI. Suspect hemodynamically mediated AKI in the setting of A. fib with RVR, IV contrast, recent infection.  UA without proteinuria or microscopic hematuria.  Kidney ultrasound ruled out obstruction.   -Her UOP was improving through the week with stable Cr but in the setting of worsening azotemia was given a trial of albumin volume expansion without improvement.  -Have had numerous discussions with Lolita Patella re: dialysis.  Recommend against it in light of comorbids and not a candidate for long term.  She desires a short term trial to see if a bit more time will improve renal function.  Plan is PCCM to place temp cath today and we will do a 1 week trial of HD.  If unable to liberate from HD would need to move to palliative measures.  I discussed this frankly with daughter yesterday.  Of note palliative care and ethics have also conversated about these issues, notes in chart.  - Continue max supportive care.  My hope is her renal function will improve as this is a precipitated AKI.   -daily labs, monitor strict I/O please  Hypotension, h/o HTN - on midodrine with normal BP  Acute on chronic hypoxic respiratory failure, h/o trach: Currently on vent in ICU.  PCCM is following.  Severe sepsis/Healthcare associated pneumonia/septic shock: s/p broad-spectrum antibiotics per primary team.  Off pressors, on midodrine  A. fib with RVR: On Eliquis, diltiazem.  Rec'd MTP IV once overnight  Malnutrition:  Supplement per RD.  Subjective: No acute events. I/Os 1.5 / 0.6.     Objective Vital signs in last 24 hours: Vitals:   05/06/20 1000 05/06/20 1100 05/06/20 1124 05/06/20 1200  BP: (!) 125/93 (!) 128/93 (!) 128/93 (!) 146/97  Pulse: 94 95 (!) 110 (!) 113  Resp: (!) 24 (!) 24 (!) 27 (!) 28  Temp:  (!) 97.3 F (36.3 C)    TempSrc:  Axillary    SpO2: 92% 92% 93% 94%  Weight:      Height:       Weight change: -0.4 kg  Intake/Output Summary (Last 24 hours) at 05/06/2020 1306 Last data filed at 05/06/2020 0800 Gross per 24 hour  Intake 1180 ml  Output 650 ml  Net 530 ml       Labs: Basic Metabolic Panel: Recent Labs  Lab 05/04/20 0024 05/05/20 0105 05/06/20 0520  NA 133* 135 135  K 5.0 5.1 4.8  CL 96* 98 97*  CO2 24 21* 21*  GLUCOSE 182* 181* 247*  BUN 169* 182* 209*  CREATININE 4.68* 4.51* 4.66*  CALCIUM 8.5* 8.9 9.3  PHOS  --   --  6.8*   Liver Function Tests: Recent Labs  Lab 05/05/20 0105 05/06/20 0520  ALBUMIN 3.4* 3.5   No results for input(s): LIPASE, AMYLASE in the last 168 hours. No results for input(s): AMMONIA in the last 168 hours. CBC: Recent Labs  Lab 04/30/20 0222 05/01/20 0135 05/01/20 1038 05/02/20 0553 05/05/20 0105 05/06/20 0520  WBC 7.5 5.6  --  7.1 6.6 7.9  NEUTROABS  --   --   --  5.1 5.4 6.2  HGB 7.6* 6.9*   < > 9.1* 7.5* 8.3*  HCT 24.7* 22.7*   < > 27.9* 24.5* 26.4*  MCV 95.0 94.2  --  90.6 94.2 94.6  PLT 170 188  --  227 187 217   < > = values in this interval not displayed.   Cardiac Enzymes: No results for input(s): CKTOTAL, CKMB, CKMBINDEX, TROPONINI in the last 168 hours. CBG: Recent Labs  Lab 05/05/20 1919 05/05/20 2335 05/06/20 0334 05/06/20 0719 05/06/20 1119  GLUCAP 226* 227* 222* 213* 182*    Iron Studies:  No results for input(s): IRON, TIBC, TRANSFERRIN, FERRITIN in the last 72 hours. Studies/Results: DG CHEST PORT 1 VIEW  Result Date: 05/05/2020 CLINICAL DATA:  Respiratory failure, history of CVA EXAM: PORTABLE CHEST 1 VIEW COMPARISON:  04/21/2020 chest radiograph.  FINDINGS: Tracheostomy tube tip overlies the tracheal air column at the thoracic inlet. Stable cardiomediastinal silhouette with top-normal heart size. No pneumothorax. Patient's right upper extremity obscures the lateral right lung base. No significant pleural effusions. Similar hazy bibasilar lung opacities. No overt pulmonary edema. IMPRESSION: 1. Similar hazy bibasilar lung opacities, favor atelectasis, difficult to exclude a component of aspiration or pneumonia. 2. Tracheostomy tube appears well-positioned. Electronically Signed   By: Jason A Poff M.D.   On: 05/05/2020 08:09    Medications: Infusions: . sodium chloride Stopped (05/05/20 1003)  . feeding supplement (VITAL AF 1.2 CAL) Stopped (05/06/20 0800)    Scheduled Medications: . amiodarone  200 mg Per Tube Daily  . chlorhexidine gluconate (MEDLINE KIT)  15 mL Mouth Rinse BID  . Chlorhexidine Gluconate Cloth  6 each Topical Daily  . fiber  1 packet Per Tube TID  . guaiFENesin  5 mL Per Tube Q12H  . insulin aspart  0-15 Units Subcutaneous Q4H  . levETIRAcetam  500 mg Per Tube QHS  . mouth rinse  15 mL Mouth Rinse 10 times per day  . midodrine  5 mg Per Tube Q8H  . nutrition supplement (JUVEN)  1 packet Per Tube BID BM  . pantoprazole sodium  40 mg Per Tube Q24H  . QUEtiapine  25 mg Per Tube QHS  . sodium chloride flush  10-40 mL Intracatheter Q12H    have reviewed scheduled and prn medications.  Physical Exam: General: Chronically ill looking female, on mechanical ventilation  Neck: +trach Heart:RRR, s1s2 nl Lungs: coarse BL, on vent via trach Abdomen:soft,  non-distended Extremities: 1+  edema throughout, Contracture of upper extremities, right AKA. Neurology:  Sleeping, localizes to me briefly but not interacting today as she has previously    A  05/06/2020,1:06 PM  LOS: 21 days   

## 2020-05-06 NOTE — Progress Notes (Signed)
PROGRESS NOTE    Rebecca Harrell  DZH:[id number] DOB: Nov 14, 1940 DOA: 04/15/2020 PCP: Aaron Edelman, MD    Brief Narrative:  Rebecca Harrell was admitted to the hospital with acute on chronic hypoxic respiratory failure due to aspiration pneumonitis.  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 LTAC resident who was transferred to acute care due to persistent tachycardia for about 48 hours, refractive to intravenous metoprolol. She was unable to give detailed history.  At time of transfer oxygen saturation mid 90s on 8 L/min supplemental oxygen, respiratory rate 30, heart rate 130, blood pressure 86/45.  She had a tracheostomy in place, coarse breath sounds bilaterally, scattered rhonchi with increased work of breathing, heart S1-S2, irregularly irregular, tachycardic, abdomen soft nontender, no lower extremity edema.  Patient was diagnosed with atrial fibrillation with rapid ventricular response, complicated with acute on chronic hypoxic respiratory failure, aspiration pneumonitis.  Admitted to the intensive care unit and placed on broad-spectrum antibiotic therapy. Bronch positive for Pseudomonas, possible colonization. Placed on anticoagulation with heparin drip.  She required 1 unit packed red blood cell transfusion.  Developed intermittent hypotension requiring vasopressor, transition to midodrine.  Tracheostomy change 3/22.  Transferred to [facility] 3/23.  Patient continue with invasive mechanical ventilation per tracheostomy. Developed ATN with worsening renal function.   Not good candidate to renal replacement therapy, with high dose of furosemide her serum cr is now trending down.  Palliative care services have been  consulted.  05/04/2020: Available records reviewed.  Discussed with the nephrologist, Dr. Johnney Ou, ICU team, Dr. Elsworth Soho and the ethics committee representative, Dr. Verneita Griffes.  Patient has nonoliguric, severe AKI, likely secondary to ATN.  Patient is making urine but remains volume overloaded.  ICU team reported difficulty when inpatient.  Potassium is high normal.  BUN is 169.  Last albumin was done on 04/24/2020, was 1.9.  Discussed option of IV albumin, alongside IV Lasix with the nephrology team.  We will continue to assess patient daily.  Continue to monitor renal function, volume status and electrolytes.  Dose all medications considering significantly impaired renal function.  Long-term prognosis is guarded.  05/05/2020: Patient remains volume overloaded.  Worsening BUN.  Discussed with nephrology team, for trial of temporary hemodialysis (volume and clearance).  Poor long-term prognosis.  05/06/2020: Patient vomited today.  Tube feed is on hold.  No significant bowel sounds.  Get abdominal x-ray.  For temporary hemodialysis placement today.  Nephrology input is highly appreciated.  Assessment & Plan:   Principal Problem:   Atrial fibrillation with rapid ventricular response (HCC) Active Problems:   History of CVA (cerebrovascular accident)   Normocytic anemia   Tracheostomy dependence (HCC)   Hypotension   Chronic pulmonary aspiration   Seizure disorder (HCC)   Acute on chronic respiratory failure with hypoxia (HCC)   Pneumonia   Hypertension associated with diabetes ([place])   Acute respiratory distress   AKI (acute kidney injury) ([place])   VRE (vancomycin resistant enterococcus) culture positive   Pressure injury of skin   Chronic respiratory failure ([place])   Palliative care by specialist   Goals of care, counseling/discussion   1. Acute on chronic hypoxemic respiratory failure. Improved mentation today, she has been on mandatory mode PRVC  invasive mechanical ventilation, with peep of 5  and Vt 400, no sedation needed, patient not showing significant dyssynchrony.  Continue  as needed fentanyl and trach care per protocol. Patient has a #8 cuffed Shiley due to leak.  Plan to return to [facility], where she can continue with invasive mechanical ventilation.  05/04/2020: ICU team is trying to wean patient off of the vent.  As per ICU team, weaning the patient will be challenging.  Optimization of volume status may help with weaning. 05/06/2020: Hopefully, optimization of volume will assist weaning trials.  2. AKI with ATN (non oliguric): Continue to have hypervolemia with diffuse pitting edema.  Her urine output over last 24 hrs 1,841  Blood pressure control with midodrine and continue high doses of furosemide (120 mg IV q 12 hrs) to target euvolemia.  Continue close monitoring of renal function and electrolytes, patient not good candidate for renal replacement therapy. If continue recovery of renal function, plan to return to SNF vent facility.  05/04/2020: Serum creatinine is stable at 4.68 today.  BUN is 169.  I agree with IV albumin.  May consider dosing Lasix 2-3 times daily versus continuous Lasix drip, will defer to the nephrology team. 05/05/2020: For trial of temporary hemodialysis (volume and clearance).  Nephrology input is highly appreciated. 05/06/2020: Temporary hemodialysis catheter will be placed today.  Likely hemodialysis afterwards (will defer to the nephrology team).   3. Paroxysmal atrial fibrillation. On amiodarone for rate and rhythm control continue with apixaban for anticoagulation  Continue close telemetry monitoring.   4. T2DM patient tolerating well tube feedings, continue insulin sliding scale for glucose cover and monitoring  5. Anemia of chronic disease. Continue stable cell count, follow as outpatient.   6. Hx of CVA with right sided hemiparesis/ stage 2 pressure ulcer (present on admission).  Continue quetiapine and keppra per feeding tube.   Continue local skin care and frequent turning.   7.  Vomiting:  -Suspect ileus. -Follow abdominal x-ray. -Tube feeds on hold. -Likely NG tube placement if ileus is confirmed.  Status is: Inpatient  Remains inpatient appropriate because:IV treatments appropriate due to intensity of illness or inability to take PO   Dispo: The patient is from: SNF              Anticipated d/c is to: SNF              Patient currently is not medically stable to d/c.   Difficult to place patient No   DVT prophylaxis: apixaban   Code Status:    full   Family Communication:  No family at the bedside      Nutrition Status: Nutrition Problem: Inadequate oral intake Etiology: inability to eat Signs/Symptoms: NPO status Interventions: Prostat,Tube feeding     Skin Documentation: Pressure Injury 04/20/20 Coccyx Stage 2 -  Partial thickness loss of dermis presenting as a shallow open injury with a red, pink wound bed without slough. (Active)  04/20/20 0300  Location: Coccyx  Location Orientation:   Staging: Stage 2 -  Partial thickness loss of dermis presenting as a shallow open injury with a red, pink wound bed without slough.  Wound Description (Comments):   Present on Admission: Yes     Consultants:   Cardiology   Pulmonary   Palliative Care  Nephrology     Subjective: Patient is not able to give any significant history.  Patient follows command.   Objective: Vitals:   05/06/20 1200 05/06/20 1300 05/06/20 1400 05/06/20 1500  BP: (!) 146/97 139/86 138/88 135/85  Pulse: (!) 113 (!) 115 98 (!) 110  Resp: (!) 28 (!) 30 (!)  27 (!) 27  Temp:      TempSrc:      SpO2: 94% 94% 93% 91%  Weight:      Height:        Intake/Output Summary (Last 24 hours) at 05/06/2020 1617 Last data filed at 05/06/2020 0800 Gross per 24 hour  Intake 1000 ml  Output 650 ml  Net 350 ml   Filed Weights   05/04/20 0500 05/05/20 0343 05/06/20 0402  Weight: 93 kg 94 kg 93.6 kg    Examination:    General: Awake.  Follows simple commands.  Not in any distress.  No significant history from the patient.   HEENT: Patient is pale.  No jaundice. Neurology: Awake and alert.  Follows simple command.  Right upper extremity is contracted.  Patient is status post right AKA.  Seems to move the left side.  Cardiovascular: S1-S2.  Pulmonary: Decreased air entry. Gastrointestinal. Abdomen obese, soft and non tender.  Organs are difficult to assess. Extremities: Patient is status post right AKA.  Left lower extremity edema.  Data Reviewed: I have personally reviewed following labs and imaging studies  CBC: Recent Labs  Lab 04/30/20 0222 05/01/20 0135 05/01/20 1038 05/02/20 0553 05/05/20 0105 05/06/20 0520  WBC 7.5 5.6  --  7.1 6.6 7.9  NEUTROABS  --   --   --  5.1 5.4 6.2  HGB 7.6* 6.9* 8.4* 9.1* 7.5* 8.3*  HCT 24.7* 22.7* 27.2* 27.9* 24.5* 26.4*  MCV 95.0 94.2  --  90.6 94.2 94.6  PLT 170 188  --  227 187 594   Basic Metabolic Panel: Recent Labs  Lab 05/01/20 2247 05/02/20 0553 05/03/20 0555 05/04/20 0024 05/05/20 0105 05/06/20 0520  NA 135 136 134* 133* 135 135  K 5.1 4.6 4.4 5.0 5.1 4.8  CL 99 98 97* 96* 98 97*  CO2 '23 24 24 24 ' 21* 21*  GLUCOSE 163* 185* 151* 182* 181* 247*  BUN 149* 152* 157* 169* 182* 209*  CREATININE 4.90* 4.88* 4.73* 4.68* 4.51* 4.66*  CALCIUM 8.6* 8.8* 8.6* 8.5* 8.9 9.3  MG 1.6*  --   --  1.9  --   --   PHOS  --   --   --   --   --  6.8*   GFR: Estimated Creatinine Clearance: 11.9 mL/min (A) (by C-G formula based on SCr of 4.66 mg/dL (H)). Liver Function Tests: Recent Labs  Lab 05/05/20 0105 05/06/20 0520  ALBUMIN 3.4* 3.5   No results for input(s): LIPASE, AMYLASE in the last 168 hours. No results for input(s): AMMONIA in the last 168 hours. Coagulation Profile: No results for input(s): INR, PROTIME in the last 168 hours. Cardiac Enzymes: No results for input(s): CKTOTAL, CKMB, CKMBINDEX, TROPONINI in the last 168 hours. BNP (last 3  results) No results for input(s): PROBNP in the last 8760 hours. HbA1C: No results for input(s): HGBA1C in the last 72 hours. CBG: Recent Labs  Lab 05/05/20 1919 05/05/20 2335 05/06/20 0334 05/06/20 0719 05/06/20 1119  GLUCAP 226* 227* 222* 213* 182*   Lipid Profile: No results for input(s): CHOL, HDL, LDLCALC, TRIG, CHOLHDL, LDLDIRECT in the last 72 hours. Thyroid Function Tests: No results for input(s): TSH, T4TOTAL, FREET4, T3FREE, THYROIDAB in the last 72 hours. Anemia Panel: No results for input(s): VITAMINB12, FOLATE, FERRITIN, TIBC, IRON, RETICCTPCT in the last 72 hours.    Radiology Studies: I have reviewed all of the imaging during this hospital visit personally     Scheduled Meds: .  amiodarone  200 mg Per Tube Daily  . chlorhexidine gluconate (MEDLINE KIT)  15 mL Mouth Rinse BID  . Chlorhexidine Gluconate Cloth  6 each Topical Daily  . [START ON 05/07/2020] Chlorhexidine Gluconate Cloth  6 each Topical Q0600  . fiber  1 packet Per Tube TID  . guaiFENesin  5 mL Per Tube Q12H  . heparin flush  10 Units Intracatheter Once  . insulin aspart  0-15 Units Subcutaneous Q4H  . levETIRAcetam  500 mg Per Tube QHS  . mouth rinse  15 mL Mouth Rinse 10 times per day  . metoprolol tartrate      . midodrine  5 mg Per Tube Q8H  . nutrition supplement (JUVEN)  1 packet Per Tube BID BM  . pantoprazole sodium  40 mg Per Tube Q24H  . QUEtiapine  25 mg Per Tube QHS  . sodium chloride flush  10-40 mL Intracatheter Q12H   Continuous Infusions: . sodium chloride Stopped (05/05/20 1003)  . feeding supplement (VITAL AF 1.2 CAL) Stopped (05/06/20 0800)     LOS: 21 days        Bonnell Public, MD

## 2020-05-06 NOTE — Procedures (Signed)
Central Venous Catheter Insertion Procedure Note  Rebecca Harrell  AH:[id number]  1940-12-04  Date:05/06/20  Time:2:05 PM   Provider Performing:Leaira Fullam   Procedure: Insertion of Non-tunneled Central Venous Catheter(36556)with US guidance [id number])    Indication(s) Hemodialysis  Consent Risks of the procedure as well as the alternatives and risks of each were explained to the patient and/or caregiver.  Consent for the procedure was obtained and is signed in the bedside chart  Anesthesia Topical only with 1% lidocaine   Timeout Verified patient identification, verified procedure, site/side was marked, verified correct patient position, special equipment/implants available, medications/allergies/relevant history reviewed, required imaging and test results available.  Sterile Technique Maximal sterile technique including full sterile barrier drape, hand hygiene, sterile gown, sterile gloves, mask, hair covering, sterile ultrasound probe cover (if used).  Procedure Description Area of catheter insertion was cleaned with chlorhexidine and draped in sterile fashion.   With real-time ultrasound guidance a HD catheter was placed into the right internal jugular vein.  Nonpulsatile blood flow and easy flushing noted in all ports.  The catheter was sutured in place and sterile dressing applied.  Complications/Tolerance None; patient tolerated the procedure well. Chest X-ray is ordered to verify placement for internal jugular or subclavian cannulation.  Chest x-ray is not ordered for femoral cannulation.  EBL Minimal  Specimen(s) None

## 2020-05-07 DIAGNOSIS — Z7189 Other specified counseling: Secondary | ICD-10-CM | POA: Diagnosis not present

## 2020-05-07 DIAGNOSIS — Z515 Encounter for palliative care: Secondary | ICD-10-CM | POA: Diagnosis not present

## 2020-05-07 DIAGNOSIS — J810 Acute pulmonary edema: Secondary | ICD-10-CM | POA: Diagnosis not present

## 2020-05-07 DIAGNOSIS — J9621 Acute and chronic respiratory failure with hypoxia: Secondary | ICD-10-CM | POA: Diagnosis not present

## 2020-05-07 DIAGNOSIS — R0603 Acute respiratory distress: Secondary | ICD-10-CM | POA: Diagnosis not present

## 2020-05-07 DIAGNOSIS — R531 Weakness: Secondary | ICD-10-CM | POA: Diagnosis not present

## 2020-05-07 DIAGNOSIS — I4891 Unspecified atrial fibrillation: Secondary | ICD-10-CM | POA: Diagnosis not present

## 2020-05-07 LAB — GLUCOSE, CAPILLARY
Glucose-Capillary: 126 mg/dL — ABNORMAL HIGH (ref 70–99)
Glucose-Capillary: 123 mg/dL — ABNORMAL HIGH (ref 70–99)
Glucose-Capillary: 122 mg/dL — ABNORMAL HIGH (ref 70–99)
Glucose-Capillary: 123 mg/dL — ABNORMAL HIGH (ref 70–99)
Glucose-Capillary: 140 mg/dL — ABNORMAL HIGH (ref 70–99)
Glucose-Capillary: 142 mg/dL — ABNORMAL HIGH (ref 70–99)
Glucose-Capillary: 120 mg/dL — ABNORMAL HIGH (ref 70–99)

## 2020-05-07 LAB — RENAL FUNCTION PANEL
Albumin: 3.3 g/dL — ABNORMAL LOW (ref 3.5–5.0)
Anion gap: 19 — ABNORMAL HIGH (ref 5–15)
BUN: 209 mg/dL — ABNORMAL HIGH (ref 8–23)
CO2: 21 mmol/L — ABNORMAL LOW (ref 22–32)
Calcium: 9.8 mg/dL (ref 8.9–10.3)
Chloride: 97 mmol/L — ABNORMAL LOW (ref 98–111)
Creatinine, Ser: 4.92 mg/dL — ABNORMAL HIGH (ref 0.44–1.00)
GFR, Estimated: 9 mL/min — ABNORMAL LOW (ref >60)
Glucose, Bld: 133 mg/dL — ABNORMAL HIGH (ref 70–99)
Phosphorus: 7.3 mg/dL — ABNORMAL HIGH (ref 2.5–4.6)
Potassium: 4.7 mmol/L (ref 3.5–5.1)
Sodium: 137 mmol/L (ref 135–145)

## 2020-05-07 LAB — CBC WITH DIFFERENTIAL/PLATELET
Abs Immature Granulocytes: 0.06 10*3/uL (ref 0.00–0.07)
Basophils Absolute: 0 10*3/uL (ref 0.0–0.1)
Basophils Relative: 0 %
Eosinophils Absolute: 0.2 10*3/uL (ref 0.0–0.5)
Eosinophils Relative: 1 %
HCT: 28.3 % — ABNORMAL LOW (ref 36.0–46.0)
Hemoglobin: 8.9 g/dL — ABNORMAL LOW (ref 12.0–15.0)
Immature Granulocytes: 1 %
Lymphocytes Relative: 11 %
Lymphs Abs: 1.2 10*3/uL (ref 0.7–4.0)
MCH: 29.5 pg (ref 26.0–34.0)
MCHC: 31.4 g/dL (ref 30.0–36.0)
MCV: 93.7 fL (ref 80.0–100.0)
Monocytes Absolute: 0.7 10*3/uL (ref 0.1–1.0)
Monocytes Relative: 7 %
Neutro Abs: 8.9 10*3/uL — ABNORMAL HIGH (ref 1.7–7.7)
Neutrophils Relative %: 80 %
Platelets: 270 10*3/uL (ref 150–400)
RBC: 3.02 MIL/uL — ABNORMAL LOW (ref 3.87–5.11)
RDW: 18.3 % — ABNORMAL HIGH (ref 11.5–15.5)
WBC: 11 10*3/uL — ABNORMAL HIGH (ref 4.0–10.5)
nRBC: 0 % (ref 0.0–0.2)

## 2020-05-07 LAB — HEPATITIS B SURFACE ANTIBODY,QUALITATIVE: Hep B S Ab: NONREACTIVE

## 2020-05-07 LAB — HEPATITIS B CORE ANTIBODY, TOTAL: Hep B Core Total Ab: NONREACTIVE

## 2020-05-07 LAB — HEPATITIS B SURFACE ANTIGEN: Hepatitis B Surface Ag: NONREACTIVE

## 2020-05-07 MED ORDER — LIDOCAINE HCL (PF) 1 % IJ SOLN: 5.0000 mL | INTRAMUSCULAR | Status: DC | PRN

## 2020-05-07 MED ORDER — PENTAFLUOROPROP-TETRAFLUOROETH EX AERO: 1.0000 "application " | INHALATION_SPRAY | CUTANEOUS | Status: DC | PRN

## 2020-05-07 MED ORDER — SODIUM CHLORIDE 0.9 % IV SOLN: 100.0000 mL | INTRAVENOUS | Status: DC | PRN

## 2020-05-07 MED ORDER — ALTEPLASE 2 MG IJ SOLR: 2.0000 mg | Freq: Once | INTRAMUSCULAR | Status: DC | PRN

## 2020-05-07 MED ORDER — METOPROLOL TARTRATE 5 MG/5ML IV SOLN
5.0000 mg | Freq: Four times a day (QID) | INTRAVENOUS | Status: DC | PRN
  Administered 2020-05-07 – 2020-05-09 (×7): 5 mg via INTRAVENOUS
  Filled 2020-05-07 (×9): qty 5

## 2020-05-07 MED ORDER — HEPARIN SODIUM (PORCINE) 1000 UNIT/ML DIALYSIS
1000.0000 [IU] | INTRAMUSCULAR | Status: DC | PRN
  Administered 2020-05-07: 2400 [IU] via INTRAVENOUS_CENTRAL
  Administered 2020-05-11: 1000 [IU] via INTRAVENOUS_CENTRAL
  Filled 2020-05-07 (×3): qty 1

## 2020-05-07 MED ORDER — LIDOCAINE-PRILOCAINE 2.5-2.5 % EX CREA
1.0000 "application " | TOPICAL_CREAM | CUTANEOUS | Status: DC | PRN
  Filled 2020-05-07: qty 5

## 2020-05-07 NOTE — Progress Notes (Signed)
[facility]                                                                              Patient Demographics  Rebecca Harrell, is a 80 y.o. female, DOB - September 18, 1940, ANV:[id number]  Admit date - 04/15/2020   Admitting Physician Vianne Bulls, MD  Outpatient Primary MD for the patient is Shackleford, Vicie Mutters, MD  Outpatient specialists:   LOS - 22  days   Medical records reviewed and are as summarized below:    Chief Complaint  Patient presents with  . Tachycardia       Brief summary   Patient is a 80 year old female with hypertension, type 2 diabetes mellitus, diastolic heart failure, seizures, CVA with right-sided hemiparesis, paroxysmal atrial fibrillation, chronic hypoxic respiratory failure status post tracheostomy, dysphagia status post PEG tube.  Patient is a [place] resident, transferred to acute care due to persistent tachycardia, refractory to intravenous metoprolol.  At the time of transfer, O2 sats mid 90s on 8 L supplemental O2, respiratory rate 30, HR 130, BP 86/45.  Patient was noted to have coarse breath sounds bilaterally, scattered rhonchi, irregularly irregular tachycardia.  Patient was diagnosed with A. fib with RVR, complicated with acute on chronic hypoxic respiratory failure, aspiration pneumonitis.  Patient was admitted to ICU, placed on broad-spectrum antibiotic therapy.  Bronch positive for Pseudomonas.  Patient was placed on anticoagulation with heparin drip, required 1 unit packed RBC transfusion.  Developed intermittent hypotension requiring vasopressors, transitioned to midodrine.  Tracheostomy change 3/22. Patient was transferred to [facility] service on 3/23. On 4/1, discussed with nephrology, Dr. Johnney Ou, ICU team, Dr. Elsworth Soho and ethics committee representative, Dr. Verlon Au.  Patient had nonoliguric severe AKI likely secondary to ATN.  Long-term prognosis guarded. On 4/2, patient was noted to be volume overloaded, trial for temporary  hemodialysis  On 4/3, patient had temporary HD cath placement by CCM, developed ileus, tube feeds placed on hold  Assessment & Plan    Acute on chronic respiratory failure with hypoxia -Trach management per pulmonology -Plan to return to [facility] where she can continue with invasive mechanical ventilation.  AKI with ATN, nonoliguric -Likely worsening due to atrial fibrillation with RVR, IV contrast, recent infection, ruled out obstruction. -Nephrology following, recommended against long-term hemodialysis.  However family requested short-term trial to see if improves renal function. -Temporary HD cath placed on 4/3, nephrology planning 1 week trial of HD.  If unable to liberate from HD, would need to move to palliative measures. -Continue supportive care  Ileus, acute -Tube feeds, meds currently on hold -Abdominal x-ray in a.m.  Paroxysmal atrial fibrillation with RVR -Oral amiodarone currently on hold due to ileus -Placed on Lopressor 5 mg IV every 6 hours as needed for tachycardia -Apixaban currently on hold  Hypotension with history of hypertension -Continue midodrine, currently on hold  Diabetes mellitus type 2, IDDM -Tube feeds currently on hold, continue sliding scale insulin  Anemia of chronic disease -H&H currently stable, 8.9  History of CVA, right-sided hemiparesis, dysphagia Continue tracheostomy, PEG tube.  Tube feeds currently on hold -Continue IV Keppra  Pressure injury, -Stage II coccyx, POA  Severe  sepsis/HCAP, septic shock -Status post broad-spectrum antibiotics per CCM, off pressors   Obesity Estimated body mass index is 31.77 kg/m as calculated from the following:   Height as of this encounter: '5\' 7"'  (1.702 m).   Weight as of this encounter: 92 kg.  Code Status: Full code DVT Prophylaxis:  Eliquis currently on hold due to ileus   Level of Care: Level of care: ICU Family Communication:    Disposition Plan:     Status is:  Inpatient  Remains inpatient appropriate because:Inpatient level of care appropriate due to severity of illness   Dispo: The patient is from: [facility]              Anticipated d/c is to: LTAC              Patient currently is not medically stable to d/c.   Difficult to place patient No      Time Spent in minutes   43mns    Procedures:    Consultants:   CCM Palliative medicine Nephrology  Antimicrobials:   Anti-infectives (From admission, onward)   Start     Dose/Rate Route Frequency Ordered Stop   04/19/20 1115  ceFEPIme (MAXIPIME) 2 g in sodium chloride 0.9 % 100 mL IVPB        2 g 200 mL/hr over 30 Minutes Intravenous Every 24 hours 04/18/20 1503 04/20/20 1051   04/19/20 1102  ceFEPIme (MAXIPIME) 2 g in sodium chloride 0.9 % 100 mL IVPB  Status:  Discontinued        2 g 200 mL/hr over 30 Minutes Intravenous Every 24 hours 04/18/20 1501 04/18/20 1503   04/17/20 1100  linezolid (ZYVOX) IVPB 600 mg        600 mg 300 mL/hr over 60 Minutes Intravenous Every 12 hours 04/17/20 1002 04/20/20 0032   04/16/20 1400  vancomycin (VANCOREADY) IVPB 1250 mg/250 mL  Status:  Discontinued        1,250 mg 166.7 mL/hr over 90 Minutes Intravenous Every 24 hours 04/15/20 1436 04/17/20 0813   04/16/20 0600  ceFEPIme (MAXIPIME) 2 g in sodium chloride 0.9 % 100 mL IVPB  Status:  Discontinued        2 g 200 mL/hr over 30 Minutes Intravenous Every 12 hours 04/15/20 2350 04/18/20 1501   04/16/20 0000  azithromycin (ZITHROMAX) 500 mg in sodium chloride 0.9 % 250 mL IVPB        500 mg 250 mL/hr over 60 Minutes Intravenous Every 24 hours 04/15/20 2350 04/20/20 0100   04/15/20 1700  piperacillin-tazobactam (ZOSYN) IVPB 3.375 g        3.375 g 12.5 mL/hr over 240 Minutes Intravenous  Once 04/15/20 1646 04/15/20 2101   04/15/20 1315  vancomycin (VANCOREADY) IVPB 1500 mg/300 mL        1,500 mg 150 mL/hr over 120 Minutes Intravenous  Once 04/15/20 1305 04/15/20 1617           Medications  Scheduled Meds: . amiodarone  200 mg Per Tube Daily  . chlorhexidine gluconate (MEDLINE KIT)  15 mL Mouth Rinse BID  . Chlorhexidine Gluconate Cloth  6 each Topical Daily  . Chlorhexidine Gluconate Cloth  6 each Topical Q0600  . guaiFENesin  5 mL Per Tube Q12H  . insulin aspart  0-15 Units Subcutaneous Q4H  . mouth rinse  15 mL Mouth Rinse 10 times per day  . midodrine  5 mg Per Tube Q8H  . pantoprazole (PROTONIX) IV  40 mg Intravenous Q24H  .  QUEtiapine  25 mg Per Tube QHS  . sodium chloride flush  10-40 mL Intracatheter Q12H   Continuous Infusions: . sodium chloride Stopped (05/05/20 1003)  . sodium chloride    . sodium chloride    . feeding supplement (VITAL AF 1.2 CAL) Stopped (05/06/20 0800)  . levETIRAcetam Stopped (05/06/20 2313)   PRN Meds:.sodium chloride, sodium chloride, acetaminophen, alteplase, dextrose, fentaNYL (SUBLIMAZE) injection, heparin, heparin, ipratropium-albuterol, lidocaine (PF), lidocaine-prilocaine, metoprolol tartrate, ondansetron (ZOFRAN) IV, oxyCODONE, pentafluoroprop-tetrafluoroeth, sodium chloride flush      Subjective:   Rebecca Harrell was seen and examined today.  Ill-appearing, awake but not interactive or following commands. + Ileus   Objective:   Vitals:   05/07/20 0600 05/07/20 0732 05/07/20 0751 05/07/20 1137  BP: 128/87     Pulse: 99  (!) 117 (!) 134  Resp: (!) 23  (!) 23 (!) 26  Temp:  98.2 F (36.8 C)  98 F (36.7 C)  TempSrc:  Axillary  Axillary  SpO2: 94%  95% 95%  Weight:      Height:        Intake/Output Summary (Last 24 hours) at 05/07/2020 1517 Last data filed at 05/07/2020 0500 Gross per 24 hour  Intake 100.15 ml  Output 550 ml  Net -449.85 ml     Wt Readings from Last 3 Encounters:  05/07/20 92 kg     Exam  General: Awake and alert, not following commands, chronically ill-appearing on vent, trach  Cardiovascular: S1 S2 auscultated, no murmurs, RRR  Respiratory: Coarse breath  sounds  Gastrointestinal: Soft, hypoactive bowel sounds, PEG tube  Ext: right-sided contractures, 1+ edema throughout, right AKA  Neuro: does not follow commands  Musculoskeletal: No digital cyanosis, clubbing  Skin: No rashes  Psych: lethargic   Data Reviewed:  I have personally reviewed following labs and imaging studies  Micro Results No results found for this or any previous visit (from the past 240 hour(s)).  Radiology Reports CT ABDOMEN WO CONTRAST  Result Date: 04/15/2020 CLINICAL DATA:  Possible G-tube malpositioning EXAM: CT ABDOMEN WITHOUT CONTRAST TECHNIQUE: Multidetector CT imaging of the abdomen was performed following the standard protocol without IV contrast. COMPARISON:  CT chest same day FINDINGS: Lower chest: Cardiomegaly. Coronary artery calcifications. Small bilateral pleural effusions with patchy bibasilar opacities. Hepatobiliary: Unremarkable unenhanced appearance of the liver. No focal liver lesion identified. Gallbladder is decompressed. No hyperdense gallstone. No biliary dilatation. Pancreas: Unremarkable. No pancreatic ductal dilatation or surrounding inflammatory changes. Spleen: Grossly unremarkable. Adrenals/Urinary Tract: No definite adrenal nodule. Kidneys appear within normal limits. Excreted contrast within the bilateral renal collecting systems. No hydronephrosis. Stomach/Bowel: Percutaneous gastrostomy tube is appropriately positioned with balloon located in the anterior aspect of the distal gastric body. No evidence of outlet obstruction. Visualized bowel loops within the upper abdomen are within normal limits. No evidence of obstruction or inflammation. Vascular/Lymphatic: Extensive atherosclerosis throughout the aortoiliac axis. No upper abdominal lymphadenopathy. Other: No ascites or pneumoperitoneum. Musculoskeletal: No acute findings. IMPRESSION: 1. Percutaneous gastrostomy tube is appropriately positioned with balloon located intraluminally in the  anterior aspect of the distal gastric body. No evidence of outlet obstruction. 2. No acute findings within the abdomen. 3. Small bilateral pleural effusions with patchy bibasilar opacities. Aortic Atherosclerosis (ICD10-I70.0). Electronically Signed   By: Davina Poke D.O.   On: 04/15/2020 16:16   DG Chest 2 View  Result Date: 04/19/2020 CLINICAL DATA:  Dyspnea, diabetes mellitus EXAM: CHEST - 2 VIEW COMPARISON:  Portable exam 1504 hours compared to 04/17/2020 FINDINGS: Tracheostomy tube  unchanged. Enlargement of cardiac silhouette. Atherosclerotic calcifications aorta. Extensive BILATERAL pulmonary infiltrates, slightly increased. This could represent pulmonary edema or multifocal pneumonia. Small LEFT pleural effusion. No pneumothorax or acute osseous findings. IMPRESSION: BILATERAL pulmonary infiltrates question pulmonary edema versus multifocal pneumonia. Enlargement of cardiac silhouette with small LEFT pleural effusion. Electronically Signed   By: Lavonia Dana M.D.   On: 04/19/2020 16:40   DG Abd 1 View  Result Date: 05/06/2020 CLINICAL DATA:  NG tube placement. EXAM: ABDOMEN - 1 VIEW COMPARISON:  KUB and chest x-ray earlier today. FINDINGS: Interval placement of nasogastric tube with tip and side-port over the stomach in the left upper quadrant. Visualized bowel gas pattern is nonobstructive. Remainder of the exam is unchanged. IMPRESSION: Nasogastric tube with tip and side-port over the stomach in the left upper quadrant. Electronically Signed   By: Marin Olp M.D.   On: 05/06/2020 16:53   DG Abd 1 View  Result Date: 05/06/2020 CLINICAL DATA:  Ileus. EXAM: ABDOMEN - 1 VIEW COMPARISON:  November 06, 2010 FINDINGS: The lung bases were not imaged. Air-filled large and small bowel suggest ileus. Early or partial small bowel obstruction not completely excluded as there is no gas in the region of the rectum. No free air, portal venous gas, or pneumatosis. The right hip is abnormal and there is either  bony erosion or a fracture. IMPRESSION: 1. Air-filled prominent loops of large and small bowel favored to represent ileus given history. Early or partial small bowel obstruction not completely excluded. Recommend close attention on follow-up. 2. Abnormal right hip with either a comminuted fracture or bony erosion, incompletely evaluated. Recommend clinical correlation and dedicated imaging. Electronically Signed   By: Dorise Bullion III M.D   On: 05/06/2020 15:07   CT Angio Chest PE W and/or Wo Contrast  Result Date: 04/15/2020 CLINICAL DATA:  Tachycardia EXAM: CT ANGIOGRAPHY CHEST WITH CONTRAST TECHNIQUE: Multidetector CT imaging of the chest was performed using the standard protocol during bolus administration of intravenous contrast. Multiplanar CT image reconstructions and MIPs were obtained to evaluate the vascular anatomy. CONTRAST:  87m OMNIPAQUE IOHEXOL 350 MG/ML SOLN COMPARISON:  04/15/2020 FINDINGS: Cardiovascular: Satisfactory opacification of the pulmonary arteries to the segmental level. No evidence of pulmonary embolism. Thoracic aorta is nonaneurysmal. Common origin of the brachiocephalic and left common carotid arteries. Tortuosity of the descending thoracic aorta. Atherosclerotic calcifications of the aorta and coronary arteries. Heart size is mildly enlarged. No pericardial effusion. Mediastinum/Nodes: Mildly prominent mediastinal lymph nodes including 12 mm precarinal node (series 5, image 37). No enlarged axillary or hilar lymph nodes identified. Tracheostomy tube is present. Trachea and esophagus within normal limits. No significant findings within the thyroid gland. Lungs/Pleura: Small bilateral pleural effusions. Loculated pleural fluid accumulation within the minor fissure on the right and within the oblique fissure on the left. Patchy bibasilar airspace opacities. Diffuse interlobular septal thickening with ground-glass attenuation throughout both lung fields. No pneumothorax. Upper  Abdomen: Although only partially visualized at the edge of the field of view. Patient's percutaneous gastrostomy tube appears malpositioned with balloon appearing extraluminal positioned anterior to the left hepatic lobe (series 5, image 97). Musculoskeletal: No acute osseous abnormality.  No chest wall mass. Review of the MIP images confirms the above findings. IMPRESSION: 1. Negative for acute pulmonary embolism. 2. Findings suggestive of pulmonary edema with small bilateral pleural effusions. Loculated pleural fluid accumulation within the minor fissure on the right and within the oblique fissure on the left. 3. Patchy bibasilar airspace opacities may represent edema,  atelectasis, or pneumonia. 4. Patient's percutaneous gastrostomy tube appears malpositioned although is incompletely visualized at the edge of the field of view. CT of the abdomen could be obtained to further evaluate. 5. Mildly prominent mediastinal lymph nodes, likely reactive. 6. Aortic and coronary artery atherosclerosis (ICD10-I70.0). Electronically Signed   By: Davina Poke D.O.   On: 04/15/2020 15:00   US RENAL  Result Date: 04/18/2020 CLINICAL DATA:  Acute renal injury. EXAM: RENAL / URINARY TRACT ULTRASOUND COMPLETE COMPARISON:  CT abdomen pelvis 04/15/2020 FINDINGS: Right Kidney: Renal measurements: 10.5 x 3.9 x 4.5 cm = volume: 96 mL. Echogenicity within normal limits. No mass or hydronephrosis visualized. Left Kidney: Renal measurements: 9.3 x 4.8 x 4.1 cm = volume: 94 mL. Echogenicity within normal limits. No mass or hydronephrosis visualized. Bladder: Appears normal for degree of bladder distention. Other: Gallstones noted within the gallbladder lumen. At least trace simple free fluid noted within the pelvis. IMPRESSION: 1. Unremarkable renal ultrasound. 2. Cholelithiasis. 3. At least trace simple free fluid within the pelvis. Electronically Signed   By: Iven Finn M.D.   On: 04/18/2020 23:04   DG CHEST PORT 1  VIEW  Result Date: 05/06/2020 CLINICAL DATA:  Central line placement EXAM: PORTABLE CHEST 1 VIEW COMPARISON:  May 05, 2020 FINDINGS: There is a new right central line with the distal tip in the central SVC. No pneumothorax. Stable tracheostomy tube. The cardiomediastinal silhouette is stable. Possible mild pulmonary venous congestion. Patchy infiltrate in the right lung, worsened in the interval. IMPRESSION: 1. Support apparatus as above. 2. New patchy pulmonary infiltrate on the right worrisome for pneumonia or aspiration. Recommend clinical correlation. 3. Possible mild pulmonary venous congestion. Electronically Signed   By: Dorise Bullion III M.D   On: 05/06/2020 15:04   DG CHEST PORT 1 VIEW  Result Date: 05/05/2020 CLINICAL DATA:  Respiratory failure, history of CVA EXAM: PORTABLE CHEST 1 VIEW COMPARISON:  04/21/2020 chest radiograph. FINDINGS: Tracheostomy tube tip overlies the tracheal air column at the thoracic inlet. Stable cardiomediastinal silhouette with top-normal heart size. No pneumothorax. Patient's right upper extremity obscures the lateral right lung base. No significant pleural effusions. Similar hazy bibasilar lung opacities. No overt pulmonary edema. IMPRESSION: 1. Similar hazy bibasilar lung opacities, favor atelectasis, difficult to exclude a component of aspiration or pneumonia. 2. Tracheostomy tube appears well-positioned. Electronically Signed   By: Ilona Sorrel M.D.   On: 05/05/2020 08:09   DG Chest Port 1 View  Result Date: 04/21/2020 CLINICAL DATA:  Respiratory failure EXAM: PORTABLE CHEST 1 VIEW COMPARISON:  CT 04/15/2020, radiograph 04/20/2020 FINDINGS: Endotracheal tube tip terminates in the mid trachea, 5 cm from the carina. Telemetry leads and external support devices overlie the chest. The patient's right hand overlies the right lung base as well. Diffuse heterogeneous opacities again seen throughout both lungs, with some interval clearing particularly in the left mid  lung. No pneumothorax. Suspect layering left effusion. No visible right effusion. Additional bandlike opacities favoring subsegmental atelectasis. Stable cardiomediastinal contours with a calcified aorta. No acute osseous or soft tissue abnormality. IMPRESSION: Endotracheal tube tip terminates in the mid trachea, 5 cm from the carina. Improving bilateral airspace opacities may reflect some resolving edema or infection. Layering left effusion. Electronically Signed   By: Lovena Le M.D.   On: 04/21/2020 05:34   DG Chest Port 1 View  Result Date: 04/20/2020 CLINICAL DATA:  Acute respiratory distress EXAM: PORTABLE CHEST 1 VIEW COMPARISON:  04/19/2020 FINDINGS: Single frontal view of the chest demonstrates stable  tracheostomy tube. Cardiac silhouette remains enlarged. Widespread interstitial and ground-glass opacities are unchanged since the prior study. Trace bilateral pleural effusions are noted. No pneumothorax. IMPRESSION: 1. Stable widespread interstitial and ground-glass opacities consistent with edema or pneumonia. 2. Stable trace bilateral pleural effusions. Electronically Signed   By: Randa Ngo M.D.   On: 04/20/2020 02:44   DG CHEST PORT 1 VIEW  Result Date: 04/17/2020 CLINICAL DATA:  Pneumonia. EXAM: PORTABLE CHEST 1 VIEW COMPARISON:  April 15, 2020. FINDINGS: Stable cardiomediastinal silhouette. Tracheostomy tube is in good position. No pneumothorax is noted. Small loculated left pleural effusion is noted. Stable bilateral lung opacities are noted concerning for pulmonary edema. Bony thorax is unremarkable. IMPRESSION: Stable bilateral lung opacities are noted concerning for pulmonary edema. Small loculated left pleural effusion is noted. Aortic Atherosclerosis (ICD10-I70.0). Electronically Signed   By: Marijo Conception M.D.   On: 04/17/2020 08:27   DG Chest Port 1 View  Result Date: 04/15/2020 CLINICAL DATA:  Tachycardia and wheezing EXAM: PORTABLE CHEST 1 VIEW COMPARISON:  November 06, 2010. FINDINGS: Tracheostomy catheter tip is 4.3 cm above the carina. No pneumothorax. There is apparent interstitial pulmonary edema with partially loculated small left pleural effusion. Ill-defined opacity noted in left base. There is cardiomegaly with pulmonary venous hypertension. No adenopathy. There is aortic atherosclerosis. No bone lesions. IMPRESSION: Tracheostomy as described without evident pneumothorax. Cardiomegaly with a degree of pulmonary vascular congestion. Evidence of interstitial pulmonary edema with small loculated pleural effusion. The appearance is suspicious for a degree of underlying congestive heart failure. Focal airspace opacity in the left base is concerning for focus of pneumonia, likely superimposed on a degree of interstitial pulmonary edema. Aortic Atherosclerosis (ICD10-I70.0). Electronically Signed   By: Lowella Grip III M.D.   On: 04/15/2020 13:55   ECHOCARDIOGRAM COMPLETE  Result Date: 04/16/2020    ECHOCARDIOGRAM REPORT   Patient Name:   RAQUELL RICHER Date of Exam: 04/16/2020 Medical Rec #:  [id number]   Height:       67.0 in Accession #:    [id number]  Weight:       173.3 lb Date of Birth:  02-18-1940   BSA:          1.903 m Patient Age:    12 years    BP:           122/77 mmHg Patient Gender: F           HR:           72 bpm. Exam Location:  Inpatient Procedure: 2D Echo, Cardiac Doppler and Color Doppler Indications:    I48.2 Chronic atrial fibrillation; I48.91* Unspeicified atrial                 fibrillation  History:        Patient has no prior history of Echocardiogram examinations.                 Stroke; Risk Factors:Diabetes.  Sonographer:    Bernadene Person RDCS Referring Phys: [id number] [facility] A CHANDRASEKHAR IMPRESSIONS  1. Left ventricular ejection fraction, by estimation, is 55 to 60%. The left ventricle has normal function. The left ventricle has no regional wall motion abnormalities. Left ventricular diastolic function could not be evaluated.  2. Right  ventricular systolic function is normal. The right ventricular size is normal. There is moderately elevated pulmonary artery systolic pressure.  3. Left atrial size was mildly dilated.  4. The mitral valve is grossly normal. Mild to moderate mitral  valve regurgitation.  5. Tricuspid valve regurgitation is moderate.  6. The aortic valve is calcified. There is mild calcification of the aortic valve. There is mild thickening of the aortic valve. Aortic valve regurgitation is mild.  7. Aortic dilatation noted. There is mild dilatation of the ascending aorta, measuring 39 mm. FINDINGS  Left Ventricle: Left ventricular ejection fraction, by estimation, is 55 to 60%. The left ventricle has normal function. The left ventricle has no regional wall motion abnormalities. The left ventricular internal cavity size was normal in size. There is  no left ventricular hypertrophy. Left ventricular diastolic function could not be evaluated due to atrial fibrillation. Left ventricular diastolic function could not be evaluated. Right Ventricle: The right ventricular size is normal. No increase in right ventricular wall thickness. Right ventricular systolic function is normal. There is moderately elevated pulmonary artery systolic pressure. The tricuspid regurgitant velocity is 3.60 m/s, and with an assumed right atrial pressure of 8 mmHg, the estimated right ventricular systolic pressure is 78.2 mmHg. Left Atrium: Left atrial size was mildly dilated. Right Atrium: Right atrial size was normal in size. Pericardium: There is no evidence of pericardial effusion. Mitral Valve: The mitral valve is grossly normal. There is moderate thickening of the mitral valve leaflet(s). There is mild calcification of the mitral valve leaflet(s). Mild to moderate mitral annular calcification. Mild to moderate mitral valve regurgitation. Tricuspid Valve: The tricuspid valve is normal in structure. Tricuspid valve regurgitation is moderate. Aortic Valve: The  aortic valve is calcified. There is mild calcification of the aortic valve. There is mild thickening of the aortic valve. Aortic valve regurgitation is mild. Aortic regurgitation PHT measures 455 msec. Pulmonic Valve: The pulmonic valve was normal in structure. Pulmonic valve regurgitation is not visualized. Aorta: Aortic dilatation noted. There is mild dilatation of the ascending aorta, measuring 39 mm. IAS/Shunts: The atrial septum is grossly normal.  LEFT VENTRICLE PLAX 2D LVIDd:         5.10 cm LVIDs:         3.90 cm LV PW:         0.90 cm LV IVS:        1.00 cm LVOT diam:     2.00 cm LV SV:         49 LV SV Index:   26 LVOT Area:     3.14 cm  LV Volumes (MOD) LV vol d, MOD A2C: 96.4 ml LV vol d, MOD A4C: 84.5 ml LV vol s, MOD A2C: 39.2 ml LV vol s, MOD A4C: 40.4 ml LV SV MOD A2C:     57.2 ml LV SV MOD A4C:     84.5 ml LV SV MOD BP:      52.9 ml RIGHT VENTRICLE RV S prime:     6.94 cm/s TAPSE (M-mode): 1.1 cm LEFT ATRIUM             Index       RIGHT ATRIUM           Index LA diam:        3.70 cm 1.94 cm/m  RA Area:     17.00 cm LA Vol (A2C):   52.0 ml 27.33 ml/m RA Volume:   46.10 ml  24.23 ml/m LA Vol (A4C):   68.7 ml 36.11 ml/m LA Biplane Vol: 51.3 ml 26.96 ml/m  AORTIC VALVE LVOT Vmax:   93.38 cm/s LVOT Vmean:  63.350 cm/s LVOT VTI:    0.156 m AI PHT:  455 msec  AORTA Ao Root diam: 3.30 cm Ao Asc diam:  3.90 cm MR Peak grad:    89.1 mmHg   TRICUSPID VALVE MR Mean grad:    61.0 mmHg   TR Peak grad:   51.8 mmHg MR Vmax:         472.00 cm/s TR Vmax:        360.00 cm/s MR Vmean:        370.0 cm/s MR PISA:         0.57 cm    SHUNTS MR PISA Eff ROA: 5 mm       Systemic VTI:  0.16 m MR PISA Radius:  0.30 cm     Systemic Diam: 2.00 cm Mertie Moores MD Electronically signed by Mertie Moores MD Signature Date/Time: 04/16/2020/4:09:55 PM    Final     Lab Data:  CBC: Recent Labs  Lab 05/01/20 0135 05/01/20 1038 05/02/20 0553 05/05/20 0105 05/06/20 0520 05/07/20 0444  WBC 5.6  --  7.1 6.6 7.9  11.0*  NEUTROABS  --   --  5.1 5.4 6.2 8.9*  HGB 6.9* 8.4* 9.1* 7.5* 8.3* 8.9*  HCT 22.7* 27.2* 27.9* 24.5* 26.4* 28.3*  MCV 94.2  --  90.6 94.2 94.6 93.7  PLT 188  --  227 187 217 833   Basic Metabolic Panel: Recent Labs  Lab 05/01/20 2247 05/02/20 0553 05/03/20 0555 05/04/20 0024 05/05/20 0105 05/06/20 0520 05/07/20 0444  NA 135   < > 134* 133* 135 135 137  K 5.1   < > 4.4 5.0 5.1 4.8 4.7  CL 99   < > 97* 96* 98 97* 97*  CO2 23   < > 24 24 21* 21* 21*  GLUCOSE 163*   < > 151* 182* 181* 247* 133*  BUN 149*   < > 157* 169* 182* 209* 209*  CREATININE 4.90*   < > 4.73* 4.68* 4.51* 4.66* 4.92*  CALCIUM 8.6*   < > 8.6* 8.5* 8.9 9.3 9.8  MG 1.6*  --   --  1.9  --   --   --   PHOS  --   --   --   --   --  6.8* 7.3*   < > = values in this interval not displayed.   GFR: Estimated Creatinine Clearance: 11.2 mL/min (A) (by C-G formula based on SCr of 4.92 mg/dL (H)). Liver Function Tests: Recent Labs  Lab 05/05/20 0105 05/06/20 0520 05/07/20 0444  ALBUMIN 3.4* 3.5 3.3*   No results for input(s): LIPASE, AMYLASE in the last 168 hours. No results for input(s): AMMONIA in the last 168 hours. Coagulation Profile: No results for input(s): INR, PROTIME in the last 168 hours. Cardiac Enzymes: No results for input(s): CKTOTAL, CKMB, CKMBINDEX, TROPONINI in the last 168 hours. BNP (last 3 results) No results for input(s): PROBNP in the last 8760 hours. HbA1C: No results for input(s): HGBA1C in the last 72 hours. CBG: Recent Labs  Lab 05/07/20 0006 05/07/20 0441 05/07/20 0731 05/07/20 1134 05/07/20 1504  GLUCAP 126* 122* 123* 123* 140*   Lipid Profile: No results for input(s): CHOL, HDL, LDLCALC, TRIG, CHOLHDL, LDLDIRECT in the last 72 hours. Thyroid Function Tests: No results for input(s): TSH, T4TOTAL, FREET4, T3FREE, THYROIDAB in the last 72 hours. Anemia Panel: No results for input(s): VITAMINB12, FOLATE, FERRITIN, TIBC, IRON, RETICCTPCT in the last 72 hours. Urine  analysis:    Component Value Date/Time   COLORURINE YELLOW 04/15/2020 1452   APPEARANCEUR HAZY (  A) 04/15/2020 1452   LABSPEC 1.010 04/15/2020 1452   PHURINE 5.0 04/15/2020 1452   GLUCOSEU NEGATIVE 04/15/2020 1452   HGBUR MODERATE (A) 04/15/2020 1452   BILIRUBINUR NEGATIVE 04/15/2020 1452   KETONESUR NEGATIVE 04/15/2020 1452   PROTEINUR NEGATIVE 04/15/2020 1452   NITRITE NEGATIVE 04/15/2020 1452   LEUKOCYTESUR LARGE (A) 04/15/2020 1452     Myrla Malanowski M.D. [facility] 05/07/2020, 3:17 PM  Available via Epic secure chat 7am-7pm After 7 pm, please refer to night coverage provider listed on amion.

## 2020-05-07 NOTE — Progress Notes (Signed)
[facility] NEPHROLOGY PROGRESS NOTE  Assessment/ Plan: Pt is a 80 y.o. yo female  with PMH of DM, HFpEF, CVA with right-sided hemiparesis/contracture, s/p right BKA, PEG tube, chronic respiratory failure status post tracheostomy, she was recently at [facility] where treated for HCAP developed A. fib with RVR, hypotension and was sent to ER.  Acute kidney injury: Cr 1 as of 04/15/20 now with severe AKI. Suspect hemodynamically mediated AKI in the setting of A. fib with RVR, IV contrast, recent infection.  UA without proteinuria or microscopic hematuria.  Kidney ultrasound ruled out obstruction.   -Her UOP was improving through the week with stable Cr but in the setting of worsening azotemia was given a trial of albumin volume expansion without improvement.  -Have had numerous discussions with Rebecca Harrell re: dialysis.  Recommend against it in light of comorbids and not a candidate for long term.  She desires a short term trial to see if a bit more time will improve renal function.  s/p temp cath 05/06/20 via PCCM and we will do a 1 week trial of HD.  If unable to liberate from HD would need to move to palliative measures.  Dr Johnney Ou discussed with dtr yesterday.  Of note palliative care and ethics have also conversated about these issues, notes in chart.  - Continue max supportive care.  My hope is her renal function will improve as this is a precipitated AKI.   -daily labs, monitor strict I/O please  Hypotension, h/o HTN - on midodrine with normal BP  Acute on chronic hypoxic respiratory failure, h/o trach: Currently on vent in ICU.  PCCM is following.  Severe sepsis/Healthcare associated pneumonia/septic shock: s/p broad-spectrum antibiotics per primary team.  Off pressors, on midodrine  A. fib with RVR: On Eliquis, diltiazem.    Malnutrition:  Supplement per RD.  Subjective: No acute events.  Doesn't really respond much today.    Objective Vital signs in last 24  hours: Vitals:   05/07/20 0500 05/07/20 0600 05/07/20 0732 05/07/20 0751  BP: (!) 130/99 128/87    Pulse: 97 99  (!) 117  Resp: (!) 31 (!) 23  (!) 23  Temp:   98.2 F (36.8 C)   TempSrc:   Axillary   SpO2: 94% 94%  95%  Weight: 92 kg     Height:       Weight change: -1.6 kg  Intake/Output Summary (Last 24 hours) at 05/07/2020 1110 Last data filed at 05/07/2020 0500 Gross per 24 hour  Intake 100.15 ml  Output 550 ml  Net -449.85 ml       Labs: Basic Metabolic Panel: Recent Labs  Lab 05/05/20 0105 05/06/20 0520 05/07/20 0444  NA 135 135 137  K 5.1 4.8 4.7  CL 98 97* 97*  CO2 21* 21* 21*  GLUCOSE 181* 247* 133*  BUN 182* 209* 209*  CREATININE 4.51* 4.66* 4.92*  CALCIUM 8.9 9.3 9.8  PHOS  --  6.8* 7.3*   Liver Function Tests: Recent Labs  Lab 05/05/20 0105 05/06/20 0520 05/07/20 0444  ALBUMIN 3.4* 3.5 3.3*   No results for input(s): LIPASE, AMYLASE in the last 168 hours. No results for input(s): AMMONIA in the last 168 hours. CBC: Recent Labs  Lab 05/01/20 0135 05/01/20 1038 05/02/20 0553 05/05/20 0105 05/06/20 0520 05/07/20 0444  WBC 5.6  --  7.1 6.6 7.9 11.0*  NEUTROABS  --    < > 5.1 5.4 6.2 8.9*  HGB 6.9*   < >  9.1* 7.5* 8.3* 8.9*  HCT 22.7*   < > 27.9* 24.5* 26.4* 28.3*  MCV 94.2  --  90.6 94.2 94.6 93.7  PLT 188  --  227 187 217 270   < > = values in this interval not displayed.   Cardiac Enzymes: No results for input(s): CKTOTAL, CKMB, CKMBINDEX, TROPONINI in the last 168 hours. CBG: Recent Labs  Lab 05/06/20 1119 05/06/20 1639 05/06/20 2010 05/07/20 0006 05/07/20 0731  GLUCAP 182* 159* 133* 126* 123*    Iron Studies:  No results for input(s): IRON, TIBC, TRANSFERRIN, FERRITIN in the last 72 hours. Studies/Results: DG Abd 1 View  Result Date: 05/06/2020 CLINICAL DATA:  NG tube placement. EXAM: ABDOMEN - 1 VIEW COMPARISON:  KUB and chest x-ray earlier today. FINDINGS: Interval placement of nasogastric tube with tip and side-port  over the stomach in the left upper quadrant. Visualized bowel gas pattern is nonobstructive. Remainder of the exam is unchanged. IMPRESSION: Nasogastric tube with tip and side-port over the stomach in the left upper quadrant. Electronically Signed   By: Marin Olp M.D.   On: 05/06/2020 16:53   DG Abd 1 View  Result Date: 05/06/2020 CLINICAL DATA:  Ileus. EXAM: ABDOMEN - 1 VIEW COMPARISON:  November 06, 2010 FINDINGS: The lung bases were not imaged. Air-filled large and small bowel suggest ileus. Early or partial small bowel obstruction not completely excluded as there is no gas in the region of the rectum. No free air, portal venous gas, or pneumatosis. The right hip is abnormal and there is either bony erosion or a fracture. IMPRESSION: 1. Air-filled prominent loops of large and small bowel favored to represent ileus given history. Early or partial small bowel obstruction not completely excluded. Recommend close attention on follow-up. 2. Abnormal right hip with either a comminuted fracture or bony erosion, incompletely evaluated. Recommend clinical correlation and dedicated imaging. Electronically Signed   By: Dorise Bullion III M.D   On: 05/06/2020 15:07   DG CHEST PORT 1 VIEW  Result Date: 05/06/2020 CLINICAL DATA:  Central line placement EXAM: PORTABLE CHEST 1 VIEW COMPARISON:  May 05, 2020 FINDINGS: There is a new right central line with the distal tip in the central SVC. No pneumothorax. Stable tracheostomy tube. The cardiomediastinal silhouette is stable. Possible mild pulmonary venous congestion. Patchy infiltrate in the right lung, worsened in the interval. IMPRESSION: 1. Support apparatus as above. 2. New patchy pulmonary infiltrate on the right worrisome for pneumonia or aspiration. Recommend clinical correlation. 3. Possible mild pulmonary venous congestion. Electronically Signed   By: Dorise Bullion III M.D   On: 05/06/2020 15:04    Medications: Infusions: . sodium chloride Stopped  (05/05/20 1003)  . feeding supplement (VITAL AF 1.2 CAL) Stopped (05/06/20 0800)  . levETIRAcetam Stopped (05/06/20 2313)    Scheduled Medications: . amiodarone  200 mg Per Tube Daily  . chlorhexidine gluconate (MEDLINE KIT)  15 mL Mouth Rinse BID  . Chlorhexidine Gluconate Cloth  6 each Topical Daily  . Chlorhexidine Gluconate Cloth  6 each Topical Q0600  . guaiFENesin  5 mL Per Tube Q12H  . insulin aspart  0-15 Units Subcutaneous Q4H  . mouth rinse  15 mL Mouth Rinse 10 times per day  . midodrine  5 mg Per Tube Q8H  . pantoprazole (PROTONIX) IV  40 mg Intravenous Q24H  . QUEtiapine  25 mg Per Tube QHS  . sodium chloride flush  10-40 mL Intracatheter Q12H    have reviewed scheduled and prn medications.  Physical Exam: General: Chronically ill looking female, on mechanical ventilation  Neck: +trach Heart:RRR, s1s2 nl Lungs: coarse BL, on vent via trach Abdomen:soft,  non-distended Extremities: 1+  edema throughout, Contracture of upper extremities, right AKA. Neurology: pretty lethargic   Rebecca Harrell 05/07/2020,11:10 AM  LOS: 22 days

## 2020-05-07 NOTE — Progress Notes (Signed)
Nutrition Follow-up  DOCUMENTATION CODES:   Not applicable  INTERVENTION:   Tube feeds currently being held due to ileus. RD will monitor for ability to resume tube feeds.  When able to resume tube feeds, recommend: - Start Vital AF 1.2 @ 20 ml/hr and advance by 10 ml q 12 hours to goal rate of 60 ml/hr (1440 ml/day)  Do NOT recommend Nepro formula due to high fat content which may cause intolerance in pt with ileus  Tube feeding regimen at goal would provide 1728 kcal, 108 grams of protein, and 1168 ml of H2O.  Tube feeding regimen at goal would provide 2369 mg of potassium and 1446 mg of phosphorus.  - d/c Nutrisource Fiber  - d/c Juven  NUTRITION DIAGNOSIS:   Inadequate oral intake related to inability to eat as evidenced by NPO status.  Ongoing  GOAL:   Patient will meet greater than or equal to 90% of their needs  Unmet at this time  MONITOR:   Vent status,Labs,Weight trends,TF tolerance,Skin,I & O's  REASON FOR ASSESSMENT:   Consult Assessment of nutrition requirement/status  ASSESSMENT:   80 year old female with history of chronic hypoxic respiratory failure with tracheostomy and PEG tube dependence, aspiration, hypertension, diabetes mellitus type 2, chronic diastolic CHF, seizure disorder, unspecified CVA with right-sided hemiparesis, chronic vegetative state, paroxysmal A. fib recently taken off anticoagulation, pneumonia receiving antibiotic treatment at [facility] prior to presentation presents with A. fib with RVR and HCAP  3/18 - placed on vent support, transferred to ICU 4/03 - pt vomited, TF held, x-ray showing ileus, NG tube placed to suction, temp HD cath placed  Discussed pt with RN and during ICU rounds. First HD is planned for today. Per notes, plan is for time limited trial of dialysis. Palliative Care following.  Pt now with ileus. Tube feeds are being held.  Pt with PEG and NG tube in place. PEG is to low intermittent suction.  Admit weight:  78.6 kg Current weight:92 kg  Patient is currently intubated on ventilator support MV: 10.5 L/min Temp (24hrs), Avg:97.9 F (36.6 C), Min:97.5 F (36.4 C), Max:98.2 F (36.8 C)  Medications reviewed and include: SSI q 4 hours, IV protonix, Juven BID, nutrisource fiber BID  Labs reviewed: BUN 209, creatinine 4.92, phosphorus 7.3, hemoglobin 8.9 CBG's: 123-182 x 24 hours  UOP: 550 ml x 24 hours  Diet Order:   Diet Order            Diet NPO time specified  Diet effective now                 EDUCATION NEEDS:   Not appropriate for education at this time  Skin:  Skin Assessment: Skin Integrity Issues: Stage II: coccyx Other: necrotic left third toe, right AKA, skin tear to back  Last BM:  05/06/20 large type 7 via rectal tube  Height:   Ht Readings from Last 1 Encounters:  04/25/20 '5\' 7"'$  (1.702 m)    Weight:   Wt Readings from Last 1 Encounters:  05/07/20 92 kg    BMI:  Body mass index is 31.77 kg/m.  Estimated Nutritional Needs:   Kcal:  1700-1900  Protein:  100-115g/day  Fluid:  1.5 L/day    Gustavus Bryant, MS, RD, LDN Inpatient Clinical Dietitian Please see AMiON for contact information.

## 2020-05-07 NOTE — Progress Notes (Signed)
Daily Progress Note   Patient Name: Kathline Banbury       Date: 05/07/2020 DOB: [birth year]  Age: 80 y.o. MRN#: [id number] Attending Physician: Mendel Corning, MD Primary Care Physician: Aaron Edelman, MD Admit Date: 04/15/2020  Reason for Consultation/Follow-up: Establishing goals of care  Subjective:  Patient is resting in bed, not alert this morning, eyes open, tracks, doesn't mouth words anymore.   Discussed with bedside nursing staff, also discussed with chaplain colleague, appreciate their care of the patient, see below.   Length of Stay: 22  Current Medications: Scheduled Meds:  . amiodarone  200 mg Per Tube Daily  . chlorhexidine gluconate (MEDLINE KIT)  15 mL Mouth Rinse BID  . Chlorhexidine Gluconate Cloth  6 each Topical Daily  . Chlorhexidine Gluconate Cloth  6 each Topical Q0600  . guaiFENesin  5 mL Per Tube Q12H  . insulin aspart  0-15 Units Subcutaneous Q4H  . mouth rinse  15 mL Mouth Rinse 10 times per day  . midodrine  5 mg Per Tube Q8H  . pantoprazole (PROTONIX) IV  40 mg Intravenous Q24H  . QUEtiapine  25 mg Per Tube QHS  . sodium chloride flush  10-40 mL Intracatheter Q12H    Continuous Infusions: . sodium chloride Stopped (05/05/20 1003)  . feeding supplement (VITAL AF 1.2 CAL) Stopped (05/06/20 0800)  . levETIRAcetam Stopped (05/06/20 2313)    PRN Meds: acetaminophen, dextrose, fentaNYL (SUBLIMAZE) injection, heparin, ipratropium-albuterol, ondansetron (ZOFRAN) IV, oxyCODONE, sodium chloride flush  Physical Exam         Patient has a trach and PEG Appears with contractures and generalized weakness Awake and tracks me in the room but not as alert, does not mouth words Now has HD catheter in IJ in neck  No distress, however, appears chronically  ill and fatigued.  Coarse breath sounds    Vital Signs: BP 128/87 (BP Location: Left Wrist)   Pulse (!) 134   Temp 98 F (36.7 C) (Axillary)   Resp (!) 26   Ht 5' 7" (1.702 m)   Wt 92 kg   SpO2 95%   BMI 31.77 kg/m  SpO2: SpO2: 95 % O2 Device: O2 Device: Ventilator O2 Flow Rate: O2 Flow Rate (L/min): 40 L/min  Intake/output summary:   Intake/Output Summary (Last 24 hours) at 05/07/2020 1204 Last  data filed at 05/07/2020 0500 Gross per 24 hour  Intake 100.15 ml  Output 550 ml  Net -449.85 ml   LBM: Last BM Date: 05/06/20 Baseline Weight: Weight: 77.1 kg Most recent weight: Weight: 92 kg      PPS 40% Palliative Assessment/Data:    Flowsheet Rows   Flowsheet Row Most Recent Value  Intake Tab   Referral Department Hospitalist  Unit at Time of Referral Cardiac/Telemetry Unit  Palliative Care Primary Diagnosis Pulmonary  Date Notified 04/19/20  Palliative Care Type New Palliative care  Reason for referral Clarify Goals of Care  Date of Admission 04/15/20  Date first seen by Palliative Care 04/20/20  # of days Palliative referral response time 1 Day(s)  # of days IP prior to Palliative referral 4  Clinical Assessment   Psychosocial & Spiritual Assessment   Palliative Care Outcomes       Patient Active Problem List   Diagnosis Date Noted  . Chronic respiratory failure ([place])   . Palliative care by specialist   . Goals of care, counseling/discussion   . Pressure injury of skin 04/20/2020  . Acute respiratory distress   . AKI (acute kidney injury) ([place])   . VRE (vancomycin resistant enterococcus) culture positive   . Hypertension associated with diabetes ([place])   . Acute pulmonary edema ([facility])   . Atrial fibrillation with rapid ventricular response ([place]) 04/15/2020  . History of CVA (cerebrovascular accident) 04/15/2020  . Normocytic anemia 04/15/2020  . 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 Hypotension 04/15/2020  . Chronic pulmonary aspiration 04/15/2020   . Seizure disorder ([place]) 04/15/2020  . Acute on chronic respiratory failure with hypoxia ([place]) 04/15/2020  . Pneumonia 04/15/2020    Palliative Care Assessment & Plan   Patient Profile:  Pt is a 80 y.o. yo female  with PMH of DM, HFpEF, CVA with right-sided hemiparesis/contracture, s/p right BKA, PEG tube, chronic respiratory failure status post tracheostomy, she was recently at [facility] where treated for HCAP developed A. fib with RVR, hypotension and was sent to ER.  Assessment:  PMT consulted for goals of care discussions, discussions regarding her renal condition and issues related to dialysis.   Recommendations/Plan:   discussed with chaplain, advance care planning documents reviewed, has HCPOA, chaplain Dorian Pod discussed with daughter [name] at bedside. Discussed with Dorian Pod.   Renal following, temp HD catheter placed, time limited trial of dialysis is being attempted, PMT to continue to monitor peripherally and re engage on an as needed basis, please call [phone] if we can be of further assistance.      Code Status:    Code Status Orders  (From admission, onward)         Start     Ordered   04/15/20 2347  Full code  Continuous        04/15/20 2350        Code Status History    This patient has a current code status but no historical code status.   Advance Care Planning Activity       Prognosis:   guarded   Discharge Planning:  To Be Determined  Care plan was discussed with  Patient and IDT.   Thank you for allowing the Palliative Medicine Team to assist in the care of this patient.   Time In: 9 Time Out: 9.25 Total Time 25 Prolonged Time Billed No       Greater than 50%  of this time was spent counseling and coordinating care  related to the above assessment and plan.  Loistine Chance, MD  Please contact Palliative Medicine Team phone at [phone] for questions and concerns.

## 2020-05-08 ENCOUNTER — Inpatient Hospital Stay (HOSPITAL_COMMUNITY): Payer: Medicare Other

## 2020-05-08 DIAGNOSIS — D62 Acute posthemorrhagic anemia: Secondary | ICD-10-CM | POA: Diagnosis not present

## 2020-05-08 DIAGNOSIS — J810 Acute pulmonary edema: Secondary | ICD-10-CM | POA: Diagnosis not present

## 2020-05-08 DIAGNOSIS — I4891 Unspecified atrial fibrillation: Secondary | ICD-10-CM | POA: Diagnosis not present

## 2020-05-08 DIAGNOSIS — K920 Hematemesis: Secondary | ICD-10-CM | POA: Diagnosis not present

## 2020-05-08 DIAGNOSIS — K921 Melena: Secondary | ICD-10-CM

## 2020-05-08 DIAGNOSIS — J9621 Acute and chronic respiratory failure with hypoxia: Secondary | ICD-10-CM | POA: Diagnosis not present

## 2020-05-08 DIAGNOSIS — R0603 Acute respiratory distress: Secondary | ICD-10-CM | POA: Diagnosis not present

## 2020-05-08 LAB — CBC WITH DIFFERENTIAL/PLATELET
Abs Immature Granulocytes: 0.05 10*3/uL (ref 0.00–0.07)
Basophils Absolute: 0 10*3/uL (ref 0.0–0.1)
Basophils Relative: 0 %
Eosinophils Absolute: 0.1 10*3/uL (ref 0.0–0.5)
Eosinophils Relative: 1 %
HCT: 22.3 % — ABNORMAL LOW (ref 36.0–46.0)
Hemoglobin: 7 g/dL — ABNORMAL LOW (ref 12.0–15.0)
Immature Granulocytes: 1 %
Lymphocytes Relative: 6 %
Lymphs Abs: 0.6 10*3/uL — ABNORMAL LOW (ref 0.7–4.0)
MCH: 30.2 pg (ref 26.0–34.0)
MCHC: 31.4 g/dL (ref 30.0–36.0)
MCV: 96.1 fL (ref 80.0–100.0)
Monocytes Absolute: 0.7 10*3/uL (ref 0.1–1.0)
Monocytes Relative: 7 %
Neutro Abs: 9.2 10*3/uL — ABNORMAL HIGH (ref 1.7–7.7)
Neutrophils Relative %: 85 %
Platelets: 229 10*3/uL (ref 150–400)
RBC: 2.32 MIL/uL — ABNORMAL LOW (ref 3.87–5.11)
RDW: 18.4 % — ABNORMAL HIGH (ref 11.5–15.5)
WBC: 10.6 10*3/uL — ABNORMAL HIGH (ref 4.0–10.5)
nRBC: 0 % (ref 0.0–0.2)

## 2020-05-08 LAB — GLUCOSE, CAPILLARY
Glucose-Capillary: 124 mg/dL — ABNORMAL HIGH (ref 70–99)
Glucose-Capillary: 141 mg/dL — ABNORMAL HIGH (ref 70–99)
Glucose-Capillary: 127 mg/dL — ABNORMAL HIGH (ref 70–99)
Glucose-Capillary: 132 mg/dL — ABNORMAL HIGH (ref 70–99)
Glucose-Capillary: 148 mg/dL — ABNORMAL HIGH (ref 70–99)
Glucose-Capillary: 144 mg/dL — ABNORMAL HIGH (ref 70–99)

## 2020-05-08 LAB — PREPARE RBC (CROSSMATCH)

## 2020-05-08 LAB — RENAL FUNCTION PANEL
Albumin: 2.9 g/dL — ABNORMAL LOW (ref 3.5–5.0)
Anion gap: 15 (ref 5–15)
BUN: 132 mg/dL — ABNORMAL HIGH (ref 8–23)
CO2: 24 mmol/L (ref 22–32)
Calcium: 9.3 mg/dL (ref 8.9–10.3)
Chloride: 100 mmol/L (ref 98–111)
Creatinine, Ser: 3.46 mg/dL — ABNORMAL HIGH (ref 0.44–1.00)
GFR, Estimated: 13 mL/min — ABNORMAL LOW (ref >60)
Glucose, Bld: 133 mg/dL — ABNORMAL HIGH (ref 70–99)
Phosphorus: 5.9 mg/dL — ABNORMAL HIGH (ref 2.5–4.6)
Potassium: 4.1 mmol/L (ref 3.5–5.1)
Sodium: 139 mmol/L (ref 135–145)

## 2020-05-08 LAB — HEMOGLOBIN AND HEMATOCRIT, BLOOD
HCT: 20.3 % — ABNORMAL LOW (ref 36.0–46.0)
HCT: 23.5 % — ABNORMAL LOW (ref 36.0–46.0)
Hemoglobin: 6.1 g/dL — CL (ref 12.0–15.0)
Hemoglobin: 7.4 g/dL — ABNORMAL LOW (ref 12.0–15.0)

## 2020-05-08 MED ORDER — SODIUM CHLORIDE 0.9% IV SOLUTION: Freq: Once | INTRAVENOUS | Status: AC

## 2020-05-08 NOTE — Progress Notes (Signed)
[facility]                                                                              Patient Demographics  Rebecca Harrell, is a 80 y.o. female, DOB - January 23, 1941, WIO:[id number]  Admit date - 04/15/2020   Admitting Physician Vianne Bulls, MD  Outpatient Primary MD for the patient is Shackleford, Vicie Mutters, MD  Outpatient specialists:   LOS - 23  days   Medical records reviewed and are as summarized below:    Chief Complaint  Patient presents with  . Tachycardia       Brief summary   Patient is a 80 year old female with hypertension, type 2 diabetes mellitus, diastolic heart failure, seizures, CVA with right-sided hemiparesis, paroxysmal atrial fibrillation, chronic hypoxic respiratory failure status post tracheostomy, dysphagia status post PEG tube.  Patient is a [place] resident, transferred to acute care due to persistent tachycardia, refractory to intravenous metoprolol.  At the time of transfer, O2 sats mid 90s on 8 L supplemental O2, respiratory rate 30, HR 130, BP 86/45.  Patient was noted to have coarse breath sounds bilaterally, scattered rhonchi, irregularly irregular tachycardia.  Patient was diagnosed with A. fib with RVR, complicated with acute on chronic hypoxic respiratory failure, aspiration pneumonitis.  Patient was admitted to ICU, placed on broad-spectrum antibiotic therapy.  Bronch positive for Pseudomonas.  Patient was placed on anticoagulation with heparin drip, required 1 unit packed RBC transfusion.  Developed intermittent hypotension requiring vasopressors, transitioned to midodrine.  Tracheostomy change 3/22. Patient was transferred to [facility] service on 3/23. On 4/1, discussed with nephrology, Dr. Johnney Ou, ICU team, Dr. Elsworth Soho and ethics committee representative, Dr. Verlon Au.  Patient had nonoliguric severe AKI likely secondary to ATN.  Long-term prognosis guarded. On 4/2, patient was noted to be volume overloaded, trial for temporary  hemodialysis  On 4/3, patient had temporary HD cath placement by CCM, developed ileus, tube feeds placed on hold, NGT placed 4/5: Developed GI bleeding with bloody BMs  Assessment & Plan    Acute on chronic respiratory failure with hypoxia -Trach management per pulmonology -Plan to return to [facility] where she can continue with invasive mechanical ventilation.  AKI with ATN, nonoliguric -Likely worsening due to atrial fibrillation with RVR, IV contrast, recent infection, ruled out obstruction. -Nephrology following, recommended against long-term hemodialysis.  However family requested short-term trial to see if improves renal function. -Temporary HD cath placed on 4/3, nephrology planning 1 week trial of HD.  If unable to liberate from HD, would need to move to palliative measures. -Continue supportive care. -Underwent hemodialysis on 4/4, tolerated, next HD planned on 4/6  Acute GI bleed, ABLA on anemia of chronic disease -Hemoglobin 7.0 this morning, transfuse 1 unit packed RBCs.  Bloody bowel movements and dark material in NG aspirate.   -Xarelto has been held since 4/2 for HD cath placement and has not been resumed. -Continue to hold Xarelto, GI consulted.  Updated patient's daughter, [name] on the phone, patient's daughter did not know about any prior endoscopies.  Ileus, acute -Tube feeds, meds have been on hold, NGT placement Abdominal x-ray showed persistent adynamic ileus  Paroxysmal  atrial fibrillation with RVR -Oral amiodarone currently on hold due to ileus -Placed on Lopressor 5 mg IV every 6 hours as needed for tachycardia -Continue to hold Xarelto  Hypotension with history of hypertension -Continue midodrine, currently on hold  Diabetes mellitus type 2, IDDM -Tube feeds currently on hold, continue sliding scale insulin  History of CVA, right-sided hemiparesis, dysphagia Continue tracheostomy, PEG tube.  Tube feeds currently on hold -Continue IV  Keppra  Pressure injury, -Stage II coccyx, POA  Severe sepsis/HCAP, septic shock -Status post broad-spectrum antibiotics per CCM, off pressors  Goals of care -Overall poor prognosis with multiple comorbidities, palliative medicine following, family requesting full code and full scope of care  Obesity Estimated body mass index is 30.8 kg/m as calculated from the following:   Height as of this encounter: _0  (1.702 m).   Weight as of this encounter: 89.2 kg.  Code Status: Full code DVT Prophylaxis:  Eliquis currently on hold due to ileus   Level of Care: Level of care: ICU Family Communication: Updated patient's daughter, Marlowe Alt on phone [phone]   Disposition Plan:     Status is: Inpatient  Remains inpatient appropriate because:Inpatient level of care appropriate due to severity of illness   Dispo: The patient is from: [facility]              Anticipated d/c is to: LTAC              Patient currently is not medically stable to d/c.   Difficult to place patient No      Time Spent in minutes   33mns    Procedures:    Consultants:   CCM Palliative medicine Nephrology  Antimicrobials:   Anti-infectives (From admission, onward)   Start     Dose/Rate Route Frequency Ordered Stop   04/19/20 1115  ceFEPIme (MAXIPIME) 2 g in sodium chloride 0.9 % 100 mL IVPB        2 g 200 mL/hr over 30 Minutes Intravenous Every 24 hours 04/18/20 1503 04/20/20 1051   04/19/20 1102  ceFEPIme (MAXIPIME) 2 g in sodium chloride 0.9 % 100 mL IVPB  Status:  Discontinued        2 g 200 mL/hr over 30 Minutes Intravenous Every 24 hours 04/18/20 1501 04/18/20 1503   04/17/20 1100  linezolid (ZYVOX) IVPB 600 mg        600 mg 300 mL/hr over 60 Minutes Intravenous Every 12 hours 04/17/20 1002 04/20/20 0032   04/16/20 1400  vancomycin (VANCOREADY) IVPB 1250 mg/250 mL  Status:  Discontinued        1,250 mg 166.7 mL/hr over 90 Minutes Intravenous Every 24 hours 04/15/20 1436 04/17/20  0813   04/16/20 0600  ceFEPIme (MAXIPIME) 2 g in sodium chloride 0.9 % 100 mL IVPB  Status:  Discontinued        2 g 200 mL/hr over 30 Minutes Intravenous Every 12 hours 04/15/20 2350 04/18/20 1501   04/16/20 0000  azithromycin (ZITHROMAX) 500 mg in sodium chloride 0.9 % 250 mL IVPB        500 mg 250 mL/hr over 60 Minutes Intravenous Every 24 hours 04/15/20 2350 04/20/20 0100   04/15/20 1700  piperacillin-tazobactam (ZOSYN) IVPB 3.375 g        3.375 g 12.5 mL/hr over 240 Minutes Intravenous  Once 04/15/20 1646 04/15/20 2101   04/15/20 1315  vancomycin (VANCOREADY) IVPB 1500 mg/300 mL        1,500 mg 150 mL/hr over 120  Minutes Intravenous  Once 04/15/20 1305 04/15/20 1617         Medications  Scheduled Meds: . amiodarone  200 mg Per Tube Daily  . chlorhexidine gluconate (MEDLINE KIT)  15 mL Mouth Rinse BID  . Chlorhexidine Gluconate Cloth  6 each Topical Daily  . Chlorhexidine Gluconate Cloth  6 each Topical Q0600  . guaiFENesin  5 mL Per Tube Q12H  . insulin aspart  0-15 Units Subcutaneous Q4H  . mouth rinse  15 mL Mouth Rinse 10 times per day  . midodrine  5 mg Per Tube Q8H  . pantoprazole (PROTONIX) IV  40 mg Intravenous Q24H  . QUEtiapine  25 mg Per Tube QHS  . sodium chloride flush  10-40 mL Intracatheter Q12H   Continuous Infusions: . sodium chloride Stopped (05/05/20 1003)  . sodium chloride    . sodium chloride    . feeding supplement (VITAL AF 1.2 CAL) Stopped (05/06/20 0800)  . levETIRAcetam Stopped (05/08/20 0029)   PRN Meds:.sodium chloride, sodium chloride, acetaminophen, alteplase, dextrose, fentaNYL (SUBLIMAZE) injection, heparin, ipratropium-albuterol, lidocaine (PF), lidocaine-prilocaine, metoprolol tartrate, ondansetron (ZOFRAN) IV, oxyCODONE, pentafluoroprop-tetrafluoroeth, sodium chloride flush      Subjective:   Rebecca Harrell was seen and examined today.  Ill-appearing, not following any commands, no fevers, + bloody BMs   Objective:   Vitals:    05/08/20 1052 05/08/20 1100 05/08/20 1138 05/08/20 1200  BP:  115/85  117/74  Pulse:  (!) 116 (!) 114 (!) 114  Resp:  (!) 0 20 (!) 23  Temp: 98.6 F (37 C)     TempSrc: Axillary     SpO2:  98% 98% 98%  Weight:      Height:        Intake/Output Summary (Last 24 hours) at 05/08/2020 1259 Last data filed at 05/08/2020 0800 Gross per 24 hour  Intake 210 ml  Output 1350 ml  Net -1140 ml     Wt Readings from Last 3 Encounters:  05/08/20 89.2 kg   Physical Exam  General: Chronically ill appearing, on vent  Cardiovascular: S1 S2 clear, RRR. No pedal edema b/l  Respiratory: Coarse breath sounds bilaterally on vent via trach  Gastrointestinal: Soft, PEG tube  Ext: anasarca, right AKA, contracture of the upper extremities  Neuro: not following any commands  Musculoskeletal: No cyanosis, clubbing  Skin: No rashes  Psych: lethargic, not responding to any verbal commands    Data Reviewed:  I have personally reviewed following labs and imaging studies  Micro Results No results found for this or any previous visit (from the past 240 hour(s)).  Radiology Reports CT ABDOMEN WO CONTRAST  Result Date: 04/15/2020 CLINICAL DATA:  Possible G-tube malpositioning EXAM: CT ABDOMEN WITHOUT CONTRAST TECHNIQUE: Multidetector CT imaging of the abdomen was performed following the standard protocol without IV contrast. COMPARISON:  CT chest same day FINDINGS: Lower chest: Cardiomegaly. Coronary artery calcifications. Small bilateral pleural effusions with patchy bibasilar opacities. Hepatobiliary: Unremarkable unenhanced appearance of the liver. No focal liver lesion identified. Gallbladder is decompressed. No hyperdense gallstone. No biliary dilatation. Pancreas: Unremarkable. No pancreatic ductal dilatation or surrounding inflammatory changes. Spleen: Grossly unremarkable. Adrenals/Urinary Tract: No definite adrenal nodule. Kidneys appear within normal limits. Excreted contrast within the  bilateral renal collecting systems. No hydronephrosis. Stomach/Bowel: Percutaneous gastrostomy tube is appropriately positioned with balloon located in the anterior aspect of the distal gastric body. No evidence of outlet obstruction. Visualized bowel loops within the upper abdomen are within normal limits. No evidence of obstruction or inflammation.  Vascular/Lymphatic: Extensive atherosclerosis throughout the aortoiliac axis. No upper abdominal lymphadenopathy. Other: No ascites or pneumoperitoneum. Musculoskeletal: No acute findings. IMPRESSION: 1. Percutaneous gastrostomy tube is appropriately positioned with balloon located intraluminally in the anterior aspect of the distal gastric body. No evidence of outlet obstruction. 2. No acute findings within the abdomen. 3. Small bilateral pleural effusions with patchy bibasilar opacities. Aortic Atherosclerosis (ICD10-I70.0). Electronically Signed   By: Davina Poke D.O.   On: 04/15/2020 16:16   DG Chest 2 View  Result Date: 04/19/2020 CLINICAL DATA:  Dyspnea, diabetes mellitus EXAM: CHEST - 2 VIEW COMPARISON:  Portable exam 1504 hours compared to 04/17/2020 FINDINGS: Tracheostomy tube unchanged. Enlargement of cardiac silhouette. Atherosclerotic calcifications aorta. Extensive BILATERAL pulmonary infiltrates, slightly increased. This could represent pulmonary edema or multifocal pneumonia. Small LEFT pleural effusion. No pneumothorax or acute osseous findings. IMPRESSION: BILATERAL pulmonary infiltrates question pulmonary edema versus multifocal pneumonia. Enlargement of cardiac silhouette with small LEFT pleural effusion. Electronically Signed   By: Lavonia Dana M.D.   On: 04/19/2020 16:40   DG Abd 1 View  Result Date: 05/06/2020 CLINICAL DATA:  NG tube placement. EXAM: ABDOMEN - 1 VIEW COMPARISON:  KUB and chest x-ray earlier today. FINDINGS: Interval placement of nasogastric tube with tip and side-port over the stomach in the left upper quadrant.  Visualized bowel gas pattern is nonobstructive. Remainder of the exam is unchanged. IMPRESSION: Nasogastric tube with tip and side-port over the stomach in the left upper quadrant. Electronically Signed   By: Marin Olp M.D.   On: 05/06/2020 16:53   DG Abd 1 View  Result Date: 05/06/2020 CLINICAL DATA:  Ileus. EXAM: ABDOMEN - 1 VIEW COMPARISON:  November 06, 2010 FINDINGS: The lung bases were not imaged. Air-filled large and small bowel suggest ileus. Early or partial small bowel obstruction not completely excluded as there is no gas in the region of the rectum. No free air, portal venous gas, or pneumatosis. The right hip is abnormal and there is either bony erosion or a fracture. IMPRESSION: 1. Air-filled prominent loops of large and small bowel favored to represent ileus given history. Early or partial small bowel obstruction not completely excluded. Recommend close attention on follow-up. 2. Abnormal right hip with either a comminuted fracture or bony erosion, incompletely evaluated. Recommend clinical correlation and dedicated imaging. Electronically Signed   By: Dorise Bullion III M.D   On: 05/06/2020 15:07   CT Angio Chest PE W and/or Wo Contrast  Result Date: 04/15/2020 CLINICAL DATA:  Tachycardia EXAM: CT ANGIOGRAPHY CHEST WITH CONTRAST TECHNIQUE: Multidetector CT imaging of the chest was performed using the standard protocol during bolus administration of intravenous contrast. Multiplanar CT image reconstructions and MIPs were obtained to evaluate the vascular anatomy. CONTRAST:  86mL OMNIPAQUE IOHEXOL 350 MG/ML SOLN COMPARISON:  04/15/2020 FINDINGS: Cardiovascular: Satisfactory opacification of the pulmonary arteries to the segmental level. No evidence of pulmonary embolism. Thoracic aorta is nonaneurysmal. Common origin of the brachiocephalic and left common carotid arteries. Tortuosity of the descending thoracic aorta. Atherosclerotic calcifications of the aorta and coronary arteries. Heart  size is mildly enlarged. No pericardial effusion. Mediastinum/Nodes: Mildly prominent mediastinal lymph nodes including 12 mm precarinal node (series 5, image 37). No enlarged axillary or hilar lymph nodes identified. Tracheostomy tube is present. Trachea and esophagus within normal limits. No significant findings within the thyroid gland. Lungs/Pleura: Small bilateral pleural effusions. Loculated pleural fluid accumulation within the minor fissure on the right and within the oblique fissure on the left. Patchy bibasilar airspace opacities.  Diffuse interlobular septal thickening with ground-glass attenuation throughout both lung fields. No pneumothorax. Upper Abdomen: Although only partially visualized at the edge of the field of view. Patient's percutaneous gastrostomy tube appears malpositioned with balloon appearing extraluminal positioned anterior to the left hepatic lobe (series 5, image 97). Musculoskeletal: No acute osseous abnormality.  No chest wall mass. Review of the MIP images confirms the above findings. IMPRESSION: 1. Negative for acute pulmonary embolism. 2. Findings suggestive of pulmonary edema with small bilateral pleural effusions. Loculated pleural fluid accumulation within the minor fissure on the right and within the oblique fissure on the left. 3. Patchy bibasilar airspace opacities may represent edema, atelectasis, or pneumonia. 4. Patient's percutaneous gastrostomy tube appears malpositioned although is incompletely visualized at the edge of the field of view. CT of the abdomen could be obtained to further evaluate. 5. Mildly prominent mediastinal lymph nodes, likely reactive. 6. Aortic and coronary artery atherosclerosis (ICD10-I70.0). Electronically Signed   By: Davina Poke D.O.   On: 04/15/2020 15:00   US RENAL  Result Date: 04/18/2020 CLINICAL DATA:  Acute renal injury. EXAM: RENAL / URINARY TRACT ULTRASOUND COMPLETE COMPARISON:  CT abdomen pelvis 04/15/2020 FINDINGS: Right  Kidney: Renal measurements: 10.5 x 3.9 x 4.5 cm = volume: 96 mL. Echogenicity within normal limits. No mass or hydronephrosis visualized. Left Kidney: Renal measurements: 9.3 x 4.8 x 4.1 cm = volume: 94 mL. Echogenicity within normal limits. No mass or hydronephrosis visualized. Bladder: Appears normal for degree of bladder distention. Other: Gallstones noted within the gallbladder lumen. At least trace simple free fluid noted within the pelvis. IMPRESSION: 1. Unremarkable renal ultrasound. 2. Cholelithiasis. 3. At least trace simple free fluid within the pelvis. Electronically Signed   By: Iven Finn M.D.   On: 04/18/2020 23:04   DG CHEST PORT 1 VIEW  Result Date: 05/06/2020 CLINICAL DATA:  Central line placement EXAM: PORTABLE CHEST 1 VIEW COMPARISON:  May 05, 2020 FINDINGS: There is a new right central line with the distal tip in the central SVC. No pneumothorax. Stable tracheostomy tube. The cardiomediastinal silhouette is stable. Possible mild pulmonary venous congestion. Patchy infiltrate in the right lung, worsened in the interval. IMPRESSION: 1. Support apparatus as above. 2. New patchy pulmonary infiltrate on the right worrisome for pneumonia or aspiration. Recommend clinical correlation. 3. Possible mild pulmonary venous congestion. Electronically Signed   By: Dorise Bullion III M.D   On: 05/06/2020 15:04   DG CHEST PORT 1 VIEW  Result Date: 05/05/2020 CLINICAL DATA:  Respiratory failure, history of CVA EXAM: PORTABLE CHEST 1 VIEW COMPARISON:  04/21/2020 chest radiograph. FINDINGS: Tracheostomy tube tip overlies the tracheal air column at the thoracic inlet. Stable cardiomediastinal silhouette with top-normal heart size. No pneumothorax. Patient's right upper extremity obscures the lateral right lung base. No significant pleural effusions. Similar hazy bibasilar lung opacities. No overt pulmonary edema. IMPRESSION: 1. Similar hazy bibasilar lung opacities, favor atelectasis, difficult to  exclude a component of aspiration or pneumonia. 2. Tracheostomy tube appears well-positioned. Electronically Signed   By: Ilona Sorrel M.D.   On: 05/05/2020 08:09   DG Chest Port 1 View  Result Date: 04/21/2020 CLINICAL DATA:  Respiratory failure EXAM: PORTABLE CHEST 1 VIEW COMPARISON:  CT 04/15/2020, radiograph 04/20/2020 FINDINGS: Endotracheal tube tip terminates in the mid trachea, 5 cm from the carina. Telemetry leads and external support devices overlie the chest. The patient's right hand overlies the right lung base as well. Diffuse heterogeneous opacities again seen throughout both lungs, with some interval clearing  particularly in the left mid lung. No pneumothorax. Suspect layering left effusion. No visible right effusion. Additional bandlike opacities favoring subsegmental atelectasis. Stable cardiomediastinal contours with a calcified aorta. No acute osseous or soft tissue abnormality. IMPRESSION: Endotracheal tube tip terminates in the mid trachea, 5 cm from the carina. Improving bilateral airspace opacities may reflect some resolving edema or infection. Layering left effusion. Electronically Signed   By: Lovena Le M.D.   On: 04/21/2020 05:34   DG Chest Port 1 View  Result Date: 04/20/2020 CLINICAL DATA:  Acute respiratory distress EXAM: PORTABLE CHEST 1 VIEW COMPARISON:  04/19/2020 FINDINGS: Single frontal view of the chest demonstrates stable tracheostomy tube. Cardiac silhouette remains enlarged. Widespread interstitial and ground-glass opacities are unchanged since the prior study. Trace bilateral pleural effusions are noted. No pneumothorax. IMPRESSION: 1. Stable widespread interstitial and ground-glass opacities consistent with edema or pneumonia. 2. Stable trace bilateral pleural effusions. Electronically Signed   By: Randa Ngo M.D.   On: 04/20/2020 02:44   DG CHEST PORT 1 VIEW  Result Date: 04/17/2020 CLINICAL DATA:  Pneumonia. EXAM: PORTABLE CHEST 1 VIEW COMPARISON:  April 15, 2020. FINDINGS: Stable cardiomediastinal silhouette. Tracheostomy tube is in good position. No pneumothorax is noted. Small loculated left pleural effusion is noted. Stable bilateral lung opacities are noted concerning for pulmonary edema. Bony thorax is unremarkable. IMPRESSION: Stable bilateral lung opacities are noted concerning for pulmonary edema. Small loculated left pleural effusion is noted. Aortic Atherosclerosis (ICD10-I70.0). Electronically Signed   By: Marijo Conception M.D.   On: 04/17/2020 08:27   DG Chest Port 1 View  Result Date: 04/15/2020 CLINICAL DATA:  Tachycardia and wheezing EXAM: PORTABLE CHEST 1 VIEW COMPARISON:  November 06, 2010. FINDINGS: Tracheostomy catheter tip is 4.3 cm above the carina. No pneumothorax. There is apparent interstitial pulmonary edema with partially loculated small left pleural effusion. Ill-defined opacity noted in left base. There is cardiomegaly with pulmonary venous hypertension. No adenopathy. There is aortic atherosclerosis. No bone lesions. IMPRESSION: Tracheostomy as described without evident pneumothorax. Cardiomegaly with a degree of pulmonary vascular congestion. Evidence of interstitial pulmonary edema with small loculated pleural effusion. The appearance is suspicious for a degree of underlying congestive heart failure. Focal airspace opacity in the left base is concerning for focus of pneumonia, likely superimposed on a degree of interstitial pulmonary edema. Aortic Atherosclerosis (ICD10-I70.0). Electronically Signed   By: Lowella Grip III M.D.   On: 04/15/2020 13:55   DG Abd Portable 1V  Result Date: 05/08/2020 CLINICAL DATA:  Advanced nasogastric tube, evaluate position EXAM: PORTABLE ABDOMEN - 1 VIEW COMPARISON:  Abdominal radiograph obtained earlier today FINDINGS: The gastric tube has advanced. The tip now overlies the gastric antrum in good position. Right IJ non tunneled hemodialysis catheter with the tip overlying the mid SVC.  Tracheostomy tube is present with the tip midline and at the level of the clavicles. Stable cardiomegaly. Atherosclerotic calcification present in the transverse aorta. Marked pulmonary vascular congestion with mild interstitial edema. Unremarkable bowel gas pattern. IMPRESSION: 1. Well-positioned NG tube with the tip overlying the gastric antrum. 2. Right IJ non tunneled hemodialysis catheter with the tip at the mid SVC. 3. Well-positioned tracheostomy tube. 4. Stable cardiomegaly with pulmonary vascular congestion and mild interstitial edema. 5. Aortic atherosclerosis. Electronically Signed   By: Jacqulynn Cadet M.D.   On: 05/08/2020 07:55   DG Abd Portable 1V  Result Date: 05/08/2020 CLINICAL DATA:  Ileus. EXAM: PORTABLE ABDOMEN - 1 VIEW COMPARISON:  05/06/2020. FINDINGS: NG tube tip  noted in the upper most portion of the stomach. Advancement of approximately 10 cm should be considered. Multiple air-filled loops of small and large bowel noted suggesting adynamic ileus. No prominent bowel distention. No free air identified. Hemidiaphragms incompletely imaged. Aortoiliac and visceral atherosclerotic vascular calcification. Radiopacity over the right lower quadrant may represent residual barium. Degenerative change lumbar spine and both hips. IMPRESSION: 1. NG tube tip noted in the upper most portion stomach. Advancement of approximately 10 cm should be considered. 2. Multiple air-filled loops of small and large bowel noted suggesting adynamic ileus. No prominent bowel distention. 3.  Aortoiliac and visceral atherosclerotic vascular disease. Electronically Signed   By: Maisie Fus  Register   On: 05/08/2020 05:42   ECHOCARDIOGRAM COMPLETE  Result Date: 04/16/2020    ECHOCARDIOGRAM REPORT   Patient Name:   GRACELYNNE BENEDICT Date of Exam: 04/16/2020 Medical Rec #:  [id number]   Height:       67.0 in Accession #:    [id number]  Weight:       173.3 lb Date of Birth:  [birth year]   BSA:          1.903 m Patient Age:    79  years    BP:           122/77 mmHg Patient Gender: F           HR:           72 bpm. Exam Location:  Inpatient Procedure: 2D Echo, Cardiac Doppler and Color Doppler Indications:    I48.2 Chronic atrial fibrillation; I48.91* Unspeicified atrial                 fibrillation  History:        Patient has no prior history of Echocardiogram examinations.                 Stroke; Risk Factors:Diabetes.  Sonographer:    Eulah Pont RDCS Referring Phys: [id number] [facility] A CHANDRASEKHAR IMPRESSIONS  1. Left ventricular ejection fraction, by estimation, is 55 to 60%. The left ventricle has normal function. The left ventricle has no regional wall motion abnormalities. Left ventricular diastolic function could not be evaluated.  2. Right ventricular systolic function is normal. The right ventricular size is normal. There is moderately elevated pulmonary artery systolic pressure.  3. Left atrial size was mildly dilated.  4. The mitral valve is grossly normal. Mild to moderate mitral valve regurgitation.  5. Tricuspid valve regurgitation is moderate.  6. The aortic valve is calcified. There is mild calcification of the aortic valve. There is mild thickening of the aortic valve. Aortic valve regurgitation is mild.  7. Aortic dilatation noted. There is mild dilatation of the ascending aorta, measuring 39 mm. FINDINGS  Left Ventricle: Left ventricular ejection fraction, by estimation, is 55 to 60%. The left ventricle has normal function. The left ventricle has no regional wall motion abnormalities. The left ventricular internal cavity size was normal in size. There is  no left ventricular hypertrophy. Left ventricular diastolic function could not be evaluated due to atrial fibrillation. Left ventricular diastolic function could not be evaluated. Right Ventricle: The right ventricular size is normal. No increase in right ventricular wall thickness. Right ventricular systolic function is normal. There is moderately elevated pulmonary  artery systolic pressure. The tricuspid regurgitant velocity is 3.60 m/s, and with an assumed right atrial pressure of 8 mmHg, the estimated right ventricular systolic pressure is 59.8 mmHg. Left Atrium: Left atrial size was mildly dilated. Right Atrium:  Right atrial size was normal in size. Pericardium: There is no evidence of pericardial effusion. Mitral Valve: The mitral valve is grossly normal. There is moderate thickening of the mitral valve leaflet(s). There is mild calcification of the mitral valve leaflet(s). Mild to moderate mitral annular calcification. Mild to moderate mitral valve regurgitation. Tricuspid Valve: The tricuspid valve is normal in structure. Tricuspid valve regurgitation is moderate. Aortic Valve: The aortic valve is calcified. There is mild calcification of the aortic valve. There is mild thickening of the aortic valve. Aortic valve regurgitation is mild. Aortic regurgitation PHT measures 455 msec. Pulmonic Valve: The pulmonic valve was normal in structure. Pulmonic valve regurgitation is not visualized. Aorta: Aortic dilatation noted. There is mild dilatation of the ascending aorta, measuring 39 mm. IAS/Shunts: The atrial septum is grossly normal.  LEFT VENTRICLE PLAX 2D LVIDd:         5.10 cm LVIDs:         3.90 cm LV PW:         0.90 cm LV IVS:        1.00 cm LVOT diam:     2.00 cm LV SV:         49 LV SV Index:   26 LVOT Area:     3.14 cm  LV Volumes (MOD) LV vol d, MOD A2C: 96.4 ml LV vol d, MOD A4C: 84.5 ml LV vol s, MOD A2C: 39.2 ml LV vol s, MOD A4C: 40.4 ml LV SV MOD A2C:     57.2 ml LV SV MOD A4C:     84.5 ml LV SV MOD BP:      52.9 ml RIGHT VENTRICLE RV S prime:     6.94 cm/s TAPSE (M-mode): 1.1 cm LEFT ATRIUM             Index       RIGHT ATRIUM           Index LA diam:        3.70 cm 1.94 cm/m  RA Area:     17.00 cm LA Vol (A2C):   52.0 ml 27.33 ml/m RA Volume:   46.10 ml  24.23 ml/m LA Vol (A4C):   68.7 ml 36.11 ml/m LA Biplane Vol: 51.3 ml 26.96 ml/m  AORTIC VALVE  LVOT Vmax:   93.38 cm/s LVOT Vmean:  63.350 cm/s LVOT VTI:    0.156 m AI PHT:      455 msec  AORTA Ao Root diam: 3.30 cm Ao Asc diam:  3.90 cm MR Peak grad:    89.1 mmHg   TRICUSPID VALVE MR Mean grad:    61.0 mmHg   TR Peak grad:   51.8 mmHg MR Vmax:         472.00 cm/s TR Vmax:        360.00 cm/s MR Vmean:        370.0 cm/s MR PISA:         0.57 cm    SHUNTS MR PISA Eff ROA: 5 mm       Systemic VTI:  0.16 m MR PISA Radius:  0.30 cm     Systemic Diam: 2.00 cm Mertie Moores MD Electronically signed by Mertie Moores MD Signature Date/Time: 04/16/2020/4:09:55 PM    Final     Lab Data:  CBC: Recent Labs  Lab 05/02/20 0553 05/05/20 0105 05/06/20 0520 05/07/20 0444 05/08/20 0406  WBC 7.1 6.6 7.9 11.0* 10.6*  NEUTROABS 5.1 5.4 6.2 8.9* 9.2*  HGB 9.1* 7.5*  8.3* 8.9* 7.0*  HCT 27.9* 24.5* 26.4* 28.3* 22.3*  MCV 90.6 94.2 94.6 93.7 96.1  PLT 227 187 217 270 448   Basic Metabolic Panel: Recent Labs  Lab 05/01/20 2247 05/02/20 0553 05/04/20 0024 05/05/20 0105 05/06/20 0520 05/07/20 0444 05/08/20 0406  NA 135   < > 133* 135 135 137 139  K 5.1   < > 5.0 5.1 4.8 4.7 4.1  CL 99   < > 96* 98 97* 97* 100  CO2 23   < > 24 21* 21* 21* 24  GLUCOSE 163*   < > 182* 181* 247* 133* 133*  BUN 149*   < > 169* 182* 209* 209* 132*  CREATININE 4.90*   < > 4.68* 4.51* 4.66* 4.92* 3.46*  CALCIUM 8.6*   < > 8.5* 8.9 9.3 9.8 9.3  MG 1.6*  --  1.9  --   --   --   --   PHOS  --   --   --   --  6.8* 7.3* 5.9*   < > = values in this interval not displayed.   GFR: Estimated Creatinine Clearance: 15.6 mL/min (A) (by C-G formula based on SCr of 3.46 mg/dL (H)). Liver Function Tests: Recent Labs  Lab 05/05/20 0105 05/06/20 0520 05/07/20 0444 05/08/20 0406  ALBUMIN 3.4* 3.5 3.3* 2.9*   No results for input(s): LIPASE, AMYLASE in the last 168 hours. No results for input(s): AMMONIA in the last 168 hours. Coagulation Profile: No results for input(s): INR, PROTIME in the last 168 hours. Cardiac  Enzymes: No results for input(s): CKTOTAL, CKMB, CKMBINDEX, TROPONINI in the last 168 hours. BNP (last 3 results) No results for input(s): PROBNP in the last 8760 hours. HbA1C: No results for input(s): HGBA1C in the last 72 hours. CBG: Recent Labs  Lab 05/07/20 1936 05/07/20 2328 05/08/20 0322 05/08/20 0726 05/08/20 1050  GLUCAP 142* 120* 124* 141* 127*   Lipid Profile: No results for input(s): CHOL, HDL, LDLCALC, TRIG, CHOLHDL, LDLDIRECT in the last 72 hours. Thyroid Function Tests: No results for input(s): TSH, T4TOTAL, FREET4, T3FREE, THYROIDAB in the last 72 hours. Anemia Panel: No results for input(s): VITAMINB12, FOLATE, FERRITIN, TIBC, IRON, RETICCTPCT in the last 72 hours. Urine analysis:    Component Value Date/Time   COLORURINE YELLOW 04/15/2020 1452   APPEARANCEUR HAZY (A) 04/15/2020 1452   LABSPEC 1.010 04/15/2020 1452   PHURINE 5.0 04/15/2020 1452   GLUCOSEU NEGATIVE 04/15/2020 1452   HGBUR MODERATE (A) 04/15/2020 1452   BILIRUBINUR NEGATIVE 04/15/2020 1452   KETONESUR NEGATIVE 04/15/2020 1452   PROTEINUR NEGATIVE 04/15/2020 1452   NITRITE NEGATIVE 04/15/2020 1452   LEUKOCYTESUR LARGE (A) 04/15/2020 1452     Princetta Uplinger M.D. [facility] 05/08/2020, 12:59 PM  Available via Epic secure chat 7am-7pm After 7 pm, please refer to night coverage provider listed on amion.

## 2020-05-08 NOTE — Progress Notes (Signed)
[facility] NEPHROLOGY PROGRESS NOTE  Assessment/ Plan: Pt is a 80 y.o. yo female  with PMH of DM, HFpEF, CVA with right-sided hemiparesis/contracture, s/p right BKA, PEG tube, chronic respiratory failure status post tracheostomy, she was recently at [facility] where treated for HCAP developed A. fib with RVR, hypotension and was sent to ER.  Acute kidney injury: Cr 1 as of 04/15/20 now with severe AKI. Suspect hemodynamically mediated AKI in the setting of A. fib with RVR, IV contrast, recent infection.  UA without proteinuria or microscopic hematuria.  Kidney ultrasound ruled out obstruction.   -Her UOP was improving through the week with stable Cr but in the setting of worsening azotemia was given a trial of albumin volume expansion without improvement.  -Have had numerous discussions with Lolita Patella re: dialysis.  Recommend against it in light of comorbids and not a candidate for long term.  She desires a short term trial to see if a bit more time will improve renal function.  s/p temp cath 05/06/20 via PCCM and we will do a 1 week trial of HD.  If unable to liberate from HD would need to move to palliative measures.  Dr Johnney Ou discussed with dtr 05/06/20.  Of note palliative care and ethics involved in discussion too - Continue max supportive care. -daily labs, monitor strict I/O please - tolerated HD 05/07/20, next HD planned 05/09/20.  Following UOP and rate of rise between rx  Hypotension, h/o HTN - on midodrine with normal BP  Acute on chronic hypoxic respiratory failure, h/o trach: Currently on vent in ICU.  PCCM is following.  Severe sepsis/Healthcare associated pneumonia/septic shock: s/p broad-spectrum antibiotics per primary team.  Off pressors, on midodrine  A. fib with RVR: On Eliquis, diltiazem.    Malnutrition:  Supplement per RD.  Subjective:  HD yesterday and tolerated, 1L UF.  Staring at ceiling today.  Objective Vital signs in last 24 hours: Vitals:    05/08/20 1000 05/08/20 1052 05/08/20 1100 05/08/20 1138  BP: 110/80  115/85   Pulse: (!) 117  (!) 116 (!) 114  Resp: (!) 0  (!) 0 20  Temp:  98.6 F (37 C)    TempSrc:  Axillary    SpO2: 98%  98% 98%  Weight:      Height:       Weight change: -2.8 kg  Intake/Output Summary (Last 24 hours) at 05/08/2020 1218 Last data filed at 05/08/2020 0800 Gross per 24 hour  Intake 210 ml  Output 1350 ml  Net -1140 ml       Labs: Basic Metabolic Panel: Recent Labs  Lab 05/06/20 0520 05/07/20 0444 05/08/20 0406  NA 135 137 139  K 4.8 4.7 4.1  CL 97* 97* 100  CO2 21* 21* 24  GLUCOSE 247* 133* 133*  BUN 209* 209* 132*  CREATININE 4.66* 4.92* 3.46*  CALCIUM 9.3 9.8 9.3  PHOS 6.8* 7.3* 5.9*   Liver Function Tests: Recent Labs  Lab 05/06/20 0520 05/07/20 0444 05/08/20 0406  ALBUMIN 3.5 3.3* 2.9*   No results for input(s): LIPASE, AMYLASE in the last 168 hours. No results for input(s): AMMONIA in the last 168 hours. CBC: Recent Labs  Lab 05/02/20 0553 05/05/20 0105 05/06/20 0520 05/07/20 0444 05/08/20 0406  WBC 7.1 6.6 7.9 11.0* 10.6*  NEUTROABS 5.1 5.4 6.2 8.9* 9.2*  HGB 9.1* 7.5* 8.3* 8.9* 7.0*  HCT 27.9* 24.5* 26.4* 28.3* 22.3*  MCV 90.6 94.2 94.6 93.7 96.1  PLT 227 187 217  270 229   Cardiac Enzymes: No results for input(s): CKTOTAL, CKMB, CKMBINDEX, TROPONINI in the last 168 hours. CBG: Recent Labs  Lab 05/07/20 1936 05/07/20 2328 05/08/20 0322 05/08/20 0726 05/08/20 1050  GLUCAP 142* 120* 124* 141* 127*    Iron Studies:  No results for input(s): IRON, TIBC, TRANSFERRIN, FERRITIN in the last 72 hours. Studies/Results: DG Abd 1 View  Result Date: 05/06/2020 CLINICAL DATA:  NG tube placement. EXAM: ABDOMEN - 1 VIEW COMPARISON:  KUB and chest x-ray earlier today. FINDINGS: Interval placement of nasogastric tube with tip and side-port over the stomach in the left upper quadrant. Visualized bowel gas pattern is nonobstructive. Remainder of the exam is  unchanged. IMPRESSION: Nasogastric tube with tip and side-port over the stomach in the left upper quadrant. Electronically Signed   By: Marin Olp M.D.   On: 05/06/2020 16:53   DG CHEST PORT 1 VIEW  Result Date: 05/06/2020 CLINICAL DATA:  Central line placement EXAM: PORTABLE CHEST 1 VIEW COMPARISON:  May 05, 2020 FINDINGS: There is a new right central line with the distal tip in the central SVC. No pneumothorax. Stable tracheostomy tube. The cardiomediastinal silhouette is stable. Possible mild pulmonary venous congestion. Patchy infiltrate in the right lung, worsened in the interval. IMPRESSION: 1. Support apparatus as above. 2. New patchy pulmonary infiltrate on the right worrisome for pneumonia or aspiration. Recommend clinical correlation. 3. Possible mild pulmonary venous congestion. Electronically Signed   By: Dorise Bullion III M.D   On: 05/06/2020 15:04   DG Abd Portable 1V  Result Date: 05/08/2020 CLINICAL DATA:  Advanced nasogastric tube, evaluate position EXAM: PORTABLE ABDOMEN - 1 VIEW COMPARISON:  Abdominal radiograph obtained earlier today FINDINGS: The gastric tube has advanced. The tip now overlies the gastric antrum in good position. Right IJ non tunneled hemodialysis catheter with the tip overlying the mid SVC. Tracheostomy tube is present with the tip midline and at the level of the clavicles. Stable cardiomegaly. Atherosclerotic calcification present in the transverse aorta. Marked pulmonary vascular congestion with mild interstitial edema. Unremarkable bowel gas pattern. IMPRESSION: 1. Well-positioned NG tube with the tip overlying the gastric antrum. 2. Right IJ non tunneled hemodialysis catheter with the tip at the mid SVC. 3. Well-positioned tracheostomy tube. 4. Stable cardiomegaly with pulmonary vascular congestion and mild interstitial edema. 5. Aortic atherosclerosis. Electronically Signed   By: Jacqulynn Cadet M.D.   On: 05/08/2020 07:55   DG Abd Portable 1V  Result  Date: 05/08/2020 CLINICAL DATA:  Ileus. EXAM: PORTABLE ABDOMEN - 1 VIEW COMPARISON:  05/06/2020. FINDINGS: NG tube tip noted in the upper most portion of the stomach. Advancement of approximately 10 cm should be considered. Multiple air-filled loops of small and large bowel noted suggesting adynamic ileus. No prominent bowel distention. No free air identified. Hemidiaphragms incompletely imaged. Aortoiliac and visceral atherosclerotic vascular calcification. Radiopacity over the right lower quadrant may represent residual barium. Degenerative change lumbar spine and both hips. IMPRESSION: 1. NG tube tip noted in the upper most portion stomach. Advancement of approximately 10 cm should be considered. 2. Multiple air-filled loops of small and large bowel noted suggesting adynamic ileus. No prominent bowel distention. 3.  Aortoiliac and visceral atherosclerotic vascular disease. Electronically Signed   By: Marcello Moores  Register   On: 05/08/2020 05:42    Medications: Infusions: . sodium chloride Stopped (05/05/20 1003)  . sodium chloride    . sodium chloride    . feeding supplement (VITAL AF 1.2 CAL) Stopped (05/06/20 0800)  . levETIRAcetam Stopped (  05/08/20 0029)    Scheduled Medications: . amiodarone  200 mg Per Tube Daily  . chlorhexidine gluconate (MEDLINE KIT)  15 mL Mouth Rinse BID  . Chlorhexidine Gluconate Cloth  6 each Topical Daily  . Chlorhexidine Gluconate Cloth  6 each Topical Q0600  . guaiFENesin  5 mL Per Tube Q12H  . insulin aspart  0-15 Units Subcutaneous Q4H  . mouth rinse  15 mL Mouth Rinse 10 times per day  . midodrine  5 mg Per Tube Q8H  . pantoprazole (PROTONIX) IV  40 mg Intravenous Q24H  . QUEtiapine  25 mg Per Tube QHS  . sodium chloride flush  10-40 mL Intracatheter Q12H    have reviewed scheduled and prn medications.  Physical Exam: General: Chronically ill looking female, on mechanical ventilation  Neck: +trach Heart:RRR, s1s2 nl Lungs: coarse BL, on vent via  trach Abdomen:soft,  non-distended Extremities: 1+ anasarca, Contracture of upper extremities, right AKA. Neurology: pretty lethargic   Tynasia Mccaul 05/08/2020,12:18 PM  LOS: 23 days

## 2020-05-08 NOTE — Consult Note (Addendum)
Referring Provider: Dr. Estill Cotta  Primary Care Physician:  Aaron Edelman, MD Primary Gastroenterologist:  Althia Forts   Reason for Consultation: Ileus, hematochezia   HPI: Antaniya Venuti is a 80 y.o. female with a past medical history of hypertension, diastolic heart failure, CVA with right sided hemiparesis/contracture, diabetes mellitus type 2, peripheral vascular disease s/p BKA. She was admitted to [facility] 03/04/2020 with sepsis with acute respiratory distress which required intubation and subsequent tracheostomy and pain tube placement.  She was transferred to [facility] 03/16/2020.  She was also found to have COVID-19 following her transfer to [facility]. She was sent to [facility] 04/15/2020 secondary to atrial fibrillation with RVR with associated hypotension.   She received Amiodarone and Heparin IV (eventually transitioned to Eliquis).  She was admitted to the ICU and placed on broad-spectrum antibiotics. A bronchoscopy was positive for Pseudomonas treated with antibiotics.  She developed intermittent hypotension requiring pressors then transitioned to Midodrine. She developed  renal failure which progressively worsened and nephrology recommended hemodialysis. Eliquis was held since Friday 4/1 in preparation for a dialysis catheter insertion. She was started on hemodialysis on 4/4.  She developed abdominal distention and abdominal x-ray on 4/3 showed visualized bowel gas pattern without an obstruction.  PEG tube feedings were placed on hold.  A NG tube was placed 05/06/2020. A repeat abdominal xray today showed multiple air-filled loops of small and large bowel suggesting adynamic ileus.  The NG tube tip was noted in the uppermost portion of the stomach therefore the NG tube was advanced approximately 10 cm.  A repeat abdominal x-ray showed NG tube tip was overlying in the gastric antrum and shortly after his RN flush the NG tube and aspirated back red blood clots.   Shortly after, the patient started passing red bloody brown liquid stool via flexi seal and a moderate amount of red bloody liquid stool was on the bed linen.  Her hemoglobin level at 4 AM was 7.0 ( Hg level 8.9 on 4/4).  A repeat H&H at 1 PM was 6.1. Dr. Tana Coast ordered 1 unit of PRBCs at this time. Note, she required PRBC transfusion secondary to anemia on 3/20 and 05/01/2020.  A GI consult was requested due to the development of an ileus in setting of GI bleeding.  No family at the bedside. The above history was obtained from the hospitalist's notes and Epic chart notes.   Abdominal Xray 05/08/2020: 1. Well-positioned NG tube with the tip overlying the gastric antrum. 2. Right IJ non tunneled hemodialysis catheter with the tip at the mid SVC. 3. Well-positioned tracheostomy tube. 4. Stable cardiomegaly with pulmonary vascular congestion and mild interstitial edema. 5. Aortic atherosclerosis.  Abdominal Xray 05/08/2020: 1. NG tube tip noted in the upper most portion stomach. Advancement of approximately 10 cm should be considered. 2. Multiple air-filled loops of small and large bowel noted suggesting adynamic ileus. No prominent bowel distention. 3.  Aortoiliac and visceral atherosclerotic vascular disease.  Abdominal xray 05/06/2020: Interval placement of nasogastric tube with tip and side-port over the stomach in the left upper quadrant. Visualized bowel gas pattern is nonobstructive. Remainder of the exam is unchanged. IMPRESSION: Nasogastric tube with tip and side-port over the stomach in the left upper quadrant.  CTAP WO contrast 04/16/2019: 1. Percutaneous gastrostomy tube is appropriately positioned with balloon located intraluminally in the anterior aspect of the distal gastric body. No evidence of outlet obstruction. 2. No acute findings within the abdomen. 3. Small bilateral pleural effusions  with patchy bibasilar opacities. Aortic Atherosclerosis    Past Medical History:   Diagnosis Date  . Diabetes mellitus without complication ([place])   . Dysrhythmia   . History of CVA (cerebrovascular accident) 04/15/2020  . Seizure disorder ([place]) 04/15/2020  . Stroke [facility])     Past Surgical History:  Procedure Laterality Date  . BELOW KNEE LEG AMPUTATION    . PEG TUBE PLACEMENT    . TRACHEOSTOMY      Prior to Admission medications   Medication Sig Start Date End Date Taking? Authorizing Provider  acetaminophen (TYLENOL) 325 MG tablet Take by mouth every 6 (six) hours as needed for mild pain or fever.   Yes [provider]  amLODipine (NORVASC) 10 MG tablet Take 10 mg by mouth daily.   Yes [provider]  ammonium lactate (LAC-HYDRIN) 12 % lotion Apply 1 application topically in the morning, at noon, and at bedtime. Apply to back every shift change   Yes [provider]  aspirin EC 81 MG tablet Take 81 mg by mouth daily. Swallow whole.   Yes [provider]  budesonide (PULMICORT) 0.5 MG/2ML nebulizer solution Take 0.5 mg by nebulization 2 (two) times daily.   Yes [provider]  chlorhexidine (PERIDEX) 0.12 % solution Use as directed 3 mLs in the mouth or throat 2 (two) times daily.   Yes [provider]  furosemide (LASIX) 20 MG tablet Take 20 mg by mouth daily.   Yes [provider]  hydrALAZINE (APRESOLINE) 50 MG tablet Take 50 mg by mouth 3 (three) times daily as needed (no indications given per [facility]).   Yes [provider]  insulin glargine (LANTUS) 100 UNIT/ML injection Inject 20 Units into the skin at bedtime.   Yes [provider]  insulin regular (NOVOLIN R) 100 units/mL injection Inject 2-10 Units into the skin every 6 (six) hours. Sliding scale: CBG 151-200 = 2 units, 201-250 = 4 units, 251-300 = 6 units, 301-350 = 8 units, 351-400 = 10 units.   Yes [provider]  ipratropium-albuterol (DUONEB) 0.5-2.5 (3) MG/3ML SOLN Take 3 mLs by nebulization every 6 (six) hours.   Yes  [provider]  lansoprazole (PREVACID) 30 MG capsule Take 30 mg by mouth daily at 12 noon.   Yes [provider]  levETIRAcetam (KEPPRA) 750 MG tablet Take 750 mg by mouth 2 (two) times daily.   Yes [provider]  miconazole (DESENEX) 2 % powder Apply 1 application topically every 12 (twelve) hours.   Yes [provider]  Nutritional Supplements (FEEDING SUPPLEMENT, NEPRO CARB STEADY,) LIQD Place 35 mLs into feeding tube continuous.   Yes [provider]  thiamine (VITAMIN B-1) 100 MG tablet Take 100 mg by mouth daily.   Yes [provider]    Current Facility-Administered Medications  Medication Dose Route Frequency Provider Last Rate Last Admin  . 0.9 %  sodium chloride infusion  250 mL Intravenous Continuous Frederik Pear, MD   Stopped at 05/05/20 1003  . 0.9 %  sodium chloride infusion  100 mL Intravenous PRN Justin Mend, MD      . 0.9 %  sodium chloride infusion  100 mL Intravenous PRN Justin Mend, MD      . acetaminophen (TYLENOL) tablet 650 mg  650 mg Per Tube Q4H PRN Opyd, Ilene Qua, MD   650 mg at 04/23/20 1408  . alteplase (CATHFLO ACTIVASE) injection 2 mg  2 mg Intracatheter Once PRN Jannifer Hick  A, MD      . amiodarone (PACERONE) tablet 200 mg  200 mg Per Tube Daily Cherlynn Kaiser A, MD   200 mg at 05/05/20 0945  . chlorhexidine gluconate (MEDLINE KIT) (PERIDEX) 0.12 % solution 15 mL  15 mL Mouth Rinse BID Chesley Mires, MD   15 mL at 05/08/20 0732  . Chlorhexidine Gluconate Cloth 2 % PADS 6 each  6 each Topical Daily Chesley Mires, MD   6 each at 05/08/20 0407  . Chlorhexidine Gluconate Cloth 2 % PADS 6 each  6 each Topical Q0600 Justin Mend, MD   6 each at 05/07/20 0518  . dextrose 50 % solution 25 mL  25 mL Intravenous PRN Aline August, MD   25 mL at 04/29/20 1224  . feeding supplement (VITAL AF 1.2 CAL) liquid 1,000 mL  1,000 mL Per Tube Continuous Arrien, Jimmy Picket, MD   Stopped at 05/06/20  0800  . fentaNYL (SUBLIMAZE) injection 25-100 mcg  25-100 mcg Intravenous Q2H PRN Merlene Laughter F, NP   50 mcg at 05/06/20 1529  . guaiFENesin (ROBITUSSIN) 100 MG/5ML solution 100 mg  5 mL Per Tube Q12H Magdalen Spatz, NP   100 mg at 05/05/20 2122  . heparin injection 1,000 Units  1,000 Units Dialysis PRN Justin Mend, MD   2,400 Units at 05/07/20 2355  . insulin aspart (novoLOG) injection 0-15 Units  0-15 Units Subcutaneous Q4H Chesley Mires, MD   2 Units at 05/08/20 0731  . ipratropium-albuterol (DUONEB) 0.5-2.5 (3) MG/3ML nebulizer solution 3 mL  3 mL Nebulization Q4H PRN Chesley Mires, MD      . levETIRAcetam (KEPPRA) IVPB 500 mg/100 mL premix  500 mg Intravenous Q24H Lyndee Leo, [facility]   Stopped at 05/08/20 7517  . lidocaine (PF) (XYLOCAINE) 1 % injection 5 mL  5 mL Intradermal PRN Justin Mend, MD      . lidocaine-prilocaine (EMLA) cream 1 application  1 application Topical PRN Justin Mend, MD      . MEDLINE mouth rinse  15 mL Mouth Rinse 10 times per day Chesley Mires, MD   15 mL at 05/08/20 1223  . metoprolol tartrate (LOPRESSOR) injection 5 mg  5 mg Intravenous Q6H PRN Rai, Ripudeep K, MD   5 mg at 05/08/20 0748  . midodrine (PROAMATINE) tablet 5 mg  5 mg Per Tube Q8H Alekh, Kshitiz, MD   5 mg at 05/06/20 0510  . ondansetron (ZOFRAN) injection 4 mg  4 mg Intravenous Q6H PRN Opyd, Ilene Qua, MD      . oxyCODONE (ROXICODONE) 5 MG/5ML solution 5 mg  5 mg Per Tube Q6H PRN Chesley Mires, MD   5 mg at 04/28/20 1754  . pantoprazole (PROTONIX) injection 40 mg  40 mg Intravenous Q24H Lyndee Leo, RPH   40 mg at 05/08/20 1222  . pentafluoroprop-tetrafluoroeth (GEBAUERS) aerosol 1 application  1 application Topical PRN Justin Mend, MD      . QUEtiapine (SEROQUEL) tablet 25 mg  25 mg Per Tube QHS Chesley Mires, MD   25 mg at 05/05/20 2122  . sodium chloride flush (NS) 0.9 % injection 10-40 mL  10-40 mL Intracatheter Q12H Opyd, Ilene Qua, MD   10 mL at 05/08/20 1222  . sodium  chloride flush (NS) 0.9 % injection 10-40 mL  10-40 mL Intracatheter PRN Opyd, Ilene Qua, MD        Allergies as of 04/15/2020  . (Not on File)    Family History  Problem Relation Age of Onset  . Diabetes type II Maternal Grandmother   . Hypertension Maternal Grandmother   . Macular degeneration Maternal Grandmother   . Diabetes type II Maternal Grandfather   . Hypertension Maternal Grandfather     Social History   Socioeconomic History  . Marital status: Divorced    Spouse name: Not on file  . Number of children: Not on file  . Years of education: Not on file  . Highest education level: Not on file  Occupational History  . Not on file  Tobacco Use  . Smoking status: Never Smoker  . Smokeless tobacco: Never Used  Substance and Sexual Activity  . Alcohol use: Not Currently  . Drug use: Not on file  . Sexual activity: Not on file  Other Topics Concern  . Not on file  Social History Narrative  . Not on file   Social Determinants of Health   Financial Resource Strain: Not on file  Food Insecurity: Not on file  Transportation Needs: Not on file  Physical Activity: Not on file  Stress: Not on file  Social Connections: Not on file  Intimate Partner Violence: Not on file    Review of Systems: Patient unresponsive on the vent therefore review of systems is not obtainable   Physical Exam: Vital signs in last 24 hours: Temp:  [97.7 F (36.5 C)-99.2 F (37.3 C)] 98.6 F (37 C) (04/05 1052) Pulse Rate:  [104-138] 114 (04/05 1200) Resp:  [0-28] 23 (04/05 1200) BP: (109-140)/(73-91) 117/74 (04/05 1200) SpO2:  [95 %-100 %] 98 % (04/05 1200) FiO2 (%):  [30 %] 30 % (04/05 1138) Weight:  [89.2 kg] 89.2 kg (04/05 0500) Last BM Date: 05/08/20 General: 80 year old female critically ill on the vent unresponsive.  Head:  Normocephalic and atraumatic. Eyes: Eyes open spontaneously with upward gaze.  Pupils equal round and slow to react.  No scleral icterus. Conjunctiva  pink. Ears:  Normal auditory acuity. Nose:  No deformity, discharge or lesions. Mouth: Absent dentition. Neck:  Supple. No lymphadenopathy or thyromegaly.  Lungs: Coarse breath sounds throughout. Heart: Tachycardic, no murmur. Abdomen: Distended, mildly firm with hypoactive bowel sounds auscultated with NG tube clamped.  Scant amount of red bloody drainage in tubing. Rectal: Deferred. Musculoskeletal: Right upper extremity contracture. Pulses:  Normal pulses noted. Extremities:  Right AKA.  Neurologic:  Eyes open spontaneously. No purposeful movement. Does not follow commands.   Skin:  Intact without significant lesions or rashes.  Intake/Output from previous day: 04/04 0701 - 04/05 0700 In: 210 [IV Piggyback:210] Out: 1550 [Urine:250; Drains:200; Stool:100] Intake/Output this shift: No intake/output data recorded.  Lab Results: Recent Labs    05/06/20 0520 05/07/20 0444 05/08/20 0406  WBC 7.9 11.0* 10.6*  HGB 8.3* 8.9* 7.0*  HCT 26.4* 28.3* 22.3*  PLT 217 270 229   BMET Recent Labs    05/06/20 0520 05/07/20 0444 05/08/20 0406  NA 135 137 139  K 4.8 4.7 4.1  CL 97* 97* 100  CO2 21* 21* 24  GLUCOSE 247* 133* 133*  BUN 209* 209* 132*  CREATININE 4.66* 4.92* 3.46*  CALCIUM 9.3 9.8 9.3   LFT Recent Labs    05/08/20 0406  ALBUMIN 2.9*   PT/INR No results for input(s): LABPROT, INR in the last 72 hours. Hepatitis Panel Recent Labs    05/07/20 1439  HEPBSAG NON REACTIVE    Studies/Results: DG Abd 1 View  Result Date: 05/06/2020 CLINICAL DATA:  NG tube placement. EXAM: ABDOMEN - 1 VIEW  COMPARISON:  KUB and chest x-ray earlier today. FINDINGS: Interval placement of nasogastric tube with tip and side-port over the stomach in the left upper quadrant. Visualized bowel gas pattern is nonobstructive. Remainder of the exam is unchanged. IMPRESSION: Nasogastric tube with tip and side-port over the stomach in the left upper quadrant. Electronically Signed   By: Marin Olp M.D.   On: 05/06/2020 16:53   DG CHEST PORT 1 VIEW  Result Date: 05/06/2020 CLINICAL DATA:  Central line placement EXAM: PORTABLE CHEST 1 VIEW COMPARISON:  May 05, 2020 FINDINGS: There is a new right central line with the distal tip in the central SVC. No pneumothorax. Stable tracheostomy tube. The cardiomediastinal silhouette is stable. Possible mild pulmonary venous congestion. Patchy infiltrate in the right lung, worsened in the interval. IMPRESSION: 1. Support apparatus as above. 2. New patchy pulmonary infiltrate on the right worrisome for pneumonia or aspiration. Recommend clinical correlation. 3. Possible mild pulmonary venous congestion. Electronically Signed   By: Dorise Bullion III M.D   On: 05/06/2020 15:04   DG Abd Portable 1V  Result Date: 05/08/2020 CLINICAL DATA:  Advanced nasogastric tube, evaluate position EXAM: PORTABLE ABDOMEN - 1 VIEW COMPARISON:  Abdominal radiograph obtained earlier today FINDINGS: The gastric tube has advanced. The tip now overlies the gastric antrum in good position. Right IJ non tunneled hemodialysis catheter with the tip overlying the mid SVC. Tracheostomy tube is present with the tip midline and at the level of the clavicles. Stable cardiomegaly. Atherosclerotic calcification present in the transverse aorta. Marked pulmonary vascular congestion with mild interstitial edema. Unremarkable bowel gas pattern. IMPRESSION: 1. Well-positioned NG tube with the tip overlying the gastric antrum. 2. Right IJ non tunneled hemodialysis catheter with the tip at the mid SVC. 3. Well-positioned tracheostomy tube. 4. Stable cardiomegaly with pulmonary vascular congestion and mild interstitial edema. 5. Aortic atherosclerosis. Electronically Signed   By: Jacqulynn Cadet M.D.   On: 05/08/2020 07:55   DG Abd Portable 1V  Result Date: 05/08/2020 CLINICAL DATA:  Ileus. EXAM: PORTABLE ABDOMEN - 1 VIEW COMPARISON:  05/06/2020. FINDINGS: NG tube tip noted in the upper most  portion of the stomach. Advancement of approximately 10 cm should be considered. Multiple air-filled loops of small and large bowel noted suggesting adynamic ileus. No prominent bowel distention. No free air identified. Hemidiaphragms incompletely imaged. Aortoiliac and visceral atherosclerotic vascular calcification. Radiopacity over the right lower quadrant may represent residual barium. Degenerative change lumbar spine and both hips. IMPRESSION: 1. NG tube tip noted in the upper most portion stomach. Advancement of approximately 10 cm should be considered. 2. Multiple air-filled loops of small and large bowel noted suggesting adynamic ileus. No prominent bowel distention. 3.  Aortoiliac and visceral atherosclerotic vascular disease. Electronically Signed   By: Marcello Moores  Register   On: 05/08/2020 05:42    IMPRESSION/PLAN:  1. 48 year with a complex medical history including CVA with right sided hemiparesis, chronic respiratory failure s/p trache and PEG placement 02/2020 admitted to the hospital 04/15/2020 with atrial fibrillation with RVR with subsequent development of  acute on chronic anemia with acute GI bleeding earlier today. NGT was advanced today for appropriate placement and shortly after NGT was flushed her RN noted red blood clots from the NGT then a scant amount of red blood in the NG tubing. The patient started passing red blood mixed in liquid brown stool (not melena) from the flex seal bag. Hg 4 am 7.0. Repeat H/H at 1pm was 6.1. One unit of PRBCs  ordered, not yet transfused. Currently, patient is tachycardic but with stable BP. Approximately 300cc of liquid red bloody brown stool present in the flexi seal bag.  -Remove flexi seal tube (was inserted at least 2 weeks ago), possible rectal ulceration  -Continue PPI IV bid -Clamp NGT for now. Nursing staff to flush NGT with 30cc Q shift, aspirate and assess for further bloody drainage.  -Agree with PRBC transfusion. Repeat H/H post  transfusion -Continue to hold Eliquis  -No plans for endoscopic evaluation at this time as patient is at high risk for procedure complications secondary to numerous comorbidities. Transfuse to maintain Hg> 7.   Further recommendation per Dr. Loletha Carrow.  -I will contact the patient's family/POA after Dr. Loletha Carrow reviews her case   2. Respiratory failure s/p trache on the ventilator -Management per the medical team   3.  Paroxysmal atrial fibrillation with RVR. On Amiodarone. Eliquis on hold since 4/1.   4.  Chronic anemia, multifactorial with GI bleed as noted in # 1  5. Adynamic ileus per abdominal xray without bowel distension 4/5. K+ 4.1.  -Check K+ and Magnesium level in am -Repeat abdominal xray in am -Turn patient Q 2 hours if tolerated   6. Acute Renal failure. RIJ dialysis catheter. S/P first hemodialysis on 4/4  7. DM II   Noralyn Pick  05/08/2020, 1:28 PM  I have reviewed the entire case in detail with the above APP. Therefore, I agree with the diagnoses recorded above. In addition,  I have personally interviewed and examined the patient and have personally reviewed any abdominal/pelvic CT scan images.  My additional thoughts are as follows:  This unfortunate woman with multiple severe acute and chronic medical issues and ongoing critical illness now has brisk GI bleeding that declared itself within the last 6 hours with both fresh blood in her NG tube and copious maroon blood per rectum and a Flexi-Seal collection system.  I suspect this is upper GI bleeding of unclear source in the setting of her critical illness.  I have been through multiple notes by critical care, nephrology, palliative care and internal medicine, and it seems clear that her care team seems to have little expectation that this patient will recover from illnesses.  And that was before she began having a brisk GI bleed.  Any endoscopic procedure would be very high risk due to her current condition,  the sedation alone for which could cause further decompensation and death.  She is about to receive her first PRBC transfusion.  In all, I suspect that, considering her overall tenuous condition, this GI bleeding is likely to be a terminal event for her. I discussed this patient with her nurse, who informs me that family has really been by to see her during this hospitalization.  Her daughter Marlowe Alt is the healthcare power of attorney and contact for Ms. Lemke.  I just called her contact number and left her voicemail,asking her to call directly to [facility]. I can see how difficult this situation has been for all involved.  However, in light of this major bleeding event, my opinion is that this patient should be made comfort care.   Nelida Meuse III Office:[phone]  Addendum 5:25 PM  I received no call back from this patient's daughter either during the time I was in the ICU or to my answering service as I gone to make further rounds.  As you spoke with Dr. Tana Coast, who is the patient's attending since yesterday.  She agrees that this patient should be comfort care, and she relates difficulty conveying that to the daughter even before the GI bleeding started.  Madelon Lips, MD

## 2020-05-09 ENCOUNTER — Inpatient Hospital Stay (HOSPITAL_COMMUNITY): Payer: Medicare Other

## 2020-05-09 DIAGNOSIS — J9621 Acute and chronic respiratory failure with hypoxia: Secondary | ICD-10-CM | POA: Diagnosis not present

## 2020-05-09 DIAGNOSIS — I4891 Unspecified atrial fibrillation: Secondary | ICD-10-CM | POA: Diagnosis not present

## 2020-05-09 DIAGNOSIS — J810 Acute pulmonary edema: Secondary | ICD-10-CM | POA: Diagnosis not present

## 2020-05-09 DIAGNOSIS — R0603 Acute respiratory distress: Secondary | ICD-10-CM | POA: Diagnosis not present

## 2020-05-09 LAB — RENAL FUNCTION PANEL
Albumin: 2.6 g/dL — ABNORMAL LOW (ref 3.5–5.0)
Albumin: 2.7 g/dL — ABNORMAL LOW (ref 3.5–5.0)
Anion gap: 16 — ABNORMAL HIGH (ref 5–15)
Anion gap: 14 (ref 5–15)
BUN: 162 mg/dL — ABNORMAL HIGH (ref 8–23)
BUN: 173 mg/dL — ABNORMAL HIGH (ref 8–23)
CO2: 22 mmol/L (ref 22–32)
CO2: 23 mmol/L (ref 22–32)
Calcium: 9.1 mg/dL (ref 8.9–10.3)
Calcium: 9.1 mg/dL (ref 8.9–10.3)
Chloride: 102 mmol/L (ref 98–111)
Chloride: 103 mmol/L (ref 98–111)
Creatinine, Ser: 3.95 mg/dL — ABNORMAL HIGH (ref 0.44–1.00)
Creatinine, Ser: 4.02 mg/dL — ABNORMAL HIGH (ref 0.44–1.00)
GFR, Estimated: 11 mL/min — ABNORMAL LOW (ref >60)
GFR, Estimated: 11 mL/min — ABNORMAL LOW (ref >60)
Glucose, Bld: 152 mg/dL — ABNORMAL HIGH (ref 70–99)
Glucose, Bld: 152 mg/dL — ABNORMAL HIGH (ref 70–99)
Phosphorus: 7.3 mg/dL — ABNORMAL HIGH (ref 2.5–4.6)
Phosphorus: 7.3 mg/dL — ABNORMAL HIGH (ref 2.5–4.6)
Potassium: 4.3 mmol/L (ref 3.5–5.1)
Potassium: 4.4 mmol/L (ref 3.5–5.1)
Sodium: 140 mmol/L (ref 135–145)
Sodium: 140 mmol/L (ref 135–145)

## 2020-05-09 LAB — CBC WITH DIFFERENTIAL/PLATELET
Abs Immature Granulocytes: 0.05 10*3/uL (ref 0.00–0.07)
Basophils Absolute: 0 10*3/uL (ref 0.0–0.1)
Basophils Relative: 0 %
Eosinophils Absolute: 0.1 10*3/uL (ref 0.0–0.5)
Eosinophils Relative: 1 %
HCT: 21.2 % — ABNORMAL LOW (ref 36.0–46.0)
Hemoglobin: 7 g/dL — ABNORMAL LOW (ref 12.0–15.0)
Immature Granulocytes: 1 %
Lymphocytes Relative: 14 %
Lymphs Abs: 1.4 10*3/uL (ref 0.7–4.0)
MCH: 30.7 pg (ref 26.0–34.0)
MCHC: 33 g/dL (ref 30.0–36.0)
MCV: 93 fL (ref 80.0–100.0)
Monocytes Absolute: 1 10*3/uL (ref 0.1–1.0)
Monocytes Relative: 10 %
Neutro Abs: 7.3 10*3/uL (ref 1.7–7.7)
Neutrophils Relative %: 74 %
Platelets: 171 10*3/uL (ref 150–400)
RBC: 2.28 MIL/uL — ABNORMAL LOW (ref 3.87–5.11)
RDW: 19 % — ABNORMAL HIGH (ref 11.5–15.5)
WBC: 9.9 10*3/uL (ref 4.0–10.5)
nRBC: 0.2 % (ref 0.0–0.2)

## 2020-05-09 LAB — HEMOGLOBIN AND HEMATOCRIT, BLOOD
HCT: 21.3 % — ABNORMAL LOW (ref 36.0–46.0)
HCT: 24.8 % — ABNORMAL LOW (ref 36.0–46.0)
Hemoglobin: 6.9 g/dL — CL (ref 12.0–15.0)
Hemoglobin: 7.9 g/dL — ABNORMAL LOW (ref 12.0–15.0)

## 2020-05-09 LAB — MAGNESIUM: Magnesium: 2.1 mg/dL (ref 1.7–2.4)

## 2020-05-09 LAB — PREPARE RBC (CROSSMATCH)

## 2020-05-09 LAB — GLUCOSE, CAPILLARY
Glucose-Capillary: 134 mg/dL — ABNORMAL HIGH (ref 70–99)
Glucose-Capillary: 144 mg/dL — ABNORMAL HIGH (ref 70–99)
Glucose-Capillary: 148 mg/dL — ABNORMAL HIGH (ref 70–99)
Glucose-Capillary: 149 mg/dL — ABNORMAL HIGH (ref 70–99)
Glucose-Capillary: 149 mg/dL — ABNORMAL HIGH (ref 70–99)
Glucose-Capillary: 154 mg/dL — ABNORMAL HIGH (ref 70–99)

## 2020-05-09 MED ORDER — AMIODARONE HCL IN DEXTROSE 360-4.14 MG/200ML-% IV SOLN
30.0000 mg/h | INTRAVENOUS | Status: DC
  Administered 2020-05-10: 30 mg/h via INTRAVENOUS
  Filled 2020-05-09: qty 200

## 2020-05-09 MED ORDER — HEPARIN SODIUM (PORCINE) 1000 UNIT/ML IJ SOLN
INTRAMUSCULAR | Status: AC
  Administered 2020-05-09: 1000 [IU] via INTRAVENOUS_CENTRAL
  Filled 2020-05-09: qty 4

## 2020-05-09 MED ORDER — DARBEPOETIN ALFA 100 MCG/0.5ML IJ SOSY
PREFILLED_SYRINGE | INTRAMUSCULAR | Status: AC
  Administered 2020-05-09: 100 ug via INTRAVENOUS
  Filled 2020-05-09: qty 0.5

## 2020-05-09 MED ORDER — AMIODARONE LOAD VIA INFUSION
150.0000 mg | Freq: Once | INTRAVENOUS | Status: AC
  Administered 2020-05-09: 150 mg via INTRAVENOUS
  Filled 2020-05-09: qty 83.34

## 2020-05-09 MED ORDER — AMIODARONE HCL IN DEXTROSE 360-4.14 MG/200ML-% IV SOLN
60.0000 mg/h | INTRAVENOUS | Status: AC
  Administered 2020-05-09 (×2): 60 mg/h via INTRAVENOUS
  Filled 2020-05-09 (×2): qty 200

## 2020-05-09 MED ORDER — PANTOPRAZOLE SODIUM 40 MG IV SOLR
40.0000 mg | Freq: Two times a day (BID) | INTRAVENOUS | Status: DC
  Administered 2020-05-09 – 2020-05-14 (×11): 40 mg via INTRAVENOUS
  Filled 2020-05-09 (×11): qty 40

## 2020-05-09 MED ORDER — SODIUM CHLORIDE 0.9% IV SOLUTION: Freq: Once | INTRAVENOUS | Status: AC

## 2020-05-09 MED ORDER — VITAL AF 1.2 CAL PO LIQD
1000.0000 mL | ORAL | Status: DC
  Administered 2020-05-09: 1000 mL
  Filled 2020-05-09: qty 1000

## 2020-05-09 MED ORDER — DARBEPOETIN ALFA 100 MCG/0.5ML IJ SOSY
100.0000 ug | PREFILLED_SYRINGE | INTRAMUSCULAR | Status: DC
  Filled 2020-05-09: qty 0.5

## 2020-05-09 NOTE — Progress Notes (Signed)
Called by RN. Pt maintaining HR in 120-140's. BP 110-140/83-100.  Pt has amiodarone held past few days due to ileus. Ileus improved on xray this am and tube feeds started today.   With HR being elevated with a-fib will place on amiodarone infusion overnight. Family wants aggressive care per notes although prognosis not good and palliative care consulted but has not evaluated or met with family yet.  If tubes feeds tolerated overnight can transition from IV amiodarone to her home dose of oral amiodarone in am.

## 2020-05-09 NOTE — Progress Notes (Signed)
[facility]                                                                              Patient Demographics  Rebecca Harrell, is a 80 y.o. female, DOB - 1940/08/24, FUX:[id number]  Admit date - 04/15/2020   Admitting Physician Vianne Bulls, MD  Outpatient Primary MD for the patient is Shackleford, Vicie Mutters, MD  Outpatient specialists:   LOS - 24  days   Medical records reviewed and are as summarized below:    Chief Complaint  Patient presents with  . Tachycardia       Brief summary   Patient is a 80 year old female with hypertension, type 2 diabetes mellitus, diastolic heart failure, seizures, CVA with right-sided hemiparesis, paroxysmal atrial fibrillation, chronic hypoxic respiratory failure status post tracheostomy, dysphagia status post PEG tube.  Patient is a [place] resident, transferred to acute care due to persistent tachycardia, refractory to intravenous metoprolol.  At the time of transfer, O2 sats mid 90s on 8 L supplemental O2, respiratory rate 30, HR 130, BP 86/45.  Patient was noted to have coarse breath sounds bilaterally, scattered rhonchi, irregularly irregular tachycardia.  Patient was diagnosed with A. fib with RVR, complicated with acute on chronic hypoxic respiratory failure, aspiration pneumonitis.  Patient was admitted to ICU, placed on broad-spectrum antibiotic therapy.  Bronch positive for Pseudomonas.  Patient was placed on anticoagulation with heparin drip, required 1 unit packed RBC transfusion.  Developed intermittent hypotension requiring vasopressors, transitioned to midodrine.  Tracheostomy change 3/22. Patient was transferred to [facility] service on 3/23. On 4/1, discussed with nephrology, Dr. Johnney Ou, ICU team, Dr. Elsworth Soho and ethics committee representative, Dr. Verlon Au.  Patient had nonoliguric severe AKI likely secondary to ATN.  Long-term prognosis guarded. On 4/2, patient was noted to be volume overloaded, trial for temporary  hemodialysis  On 4/3, patient had temporary HD cath placement by CCM, developed ileus, tube feeds placed on hold, NGT placed 4/5: Developed GI bleeding with bloody BMs 4/6: Goals of care discussion with patient's family  Assessment & Plan    Acute on chronic respiratory failure with hypoxia -Trach management per pulmonology -Plan to return to [facility] where she can continue with invasive mechanical ventilation.  AKI with ATN, nonoliguric -Likely worsening due to atrial fibrillation with RVR, IV contrast, recent infection, ruled out obstruction. -Nephrology following, recommended against long-term hemodialysis.  However family requested short-term trial to see if improves renal function. -Temporary HD cath placed on 4/3, nephrology planning 1 week trial of HD.  -Underwent hemodialysis on 4/4, tolerated, next HD planned on 4/6.  Reviewed nephrology recommendations, supporting transition to comfort care as maximal life support has remained ineffective at this point.  Acute GI bleed, ABLA on anemia of chronic disease -Hemoglobin again down to 6.9 this morning, transfused 1 unit packed RBCs.  Received 1 unit on 05/08/2020 -Xarelto has been held since 4/2 for HD cath placement and has not been resumed. -Continue to hold Xarelto, reviewed GI recommendations.  Also discussed with Dr. Loletha Carrow on 4/5.  Per GI, overall tenuous status, however endoscopic procedure would be a very high risk due to her current condition,  recommended comfort care.  Ileus, acute -Tube feeds, meds have been placed on hold.  NGT placement. -Abdominal x-ray this a.m., shows improvement in ileus, will clamp NGT -Resume tube feeds at 20 cc an hour and reassess  Paroxysmal atrial fibrillation with RVR -Oral amiodarone currently on hold due to ileus -Placed on Lopressor 5 mg IV every 6 hours as needed for tachycardia -Continue to hold Xarelto  Hypotension with history of hypertension -BP currently stable  Diabetes  mellitus type 2, IDDM -Continue sliding scale insulin  History of CVA, right-sided hemiparesis, dysphagia Continue tracheostomy, PEG tube.  - will resume tube feeds at low rate and reassess -Continue IV Keppra  Pressure injury, -Stage II coccyx, POA  Severe sepsis/HCAP, septic shock -Status post broad-spectrum antibiotics per CCM, off pressors  Goals of care I met with patient's son, [name] this morning and was able to discuss goals of care with patient's son [name] and daughter on conference call.  I explained all the recommendations from the specialist, tenuous state and overall poor prognosis.  Discussed about quality of life, code status and if patient had expressed her wishes previously.  Patient's son, [name] mentioned to pursue comfort care however the request was declined by patient's daughter, [name].  Patient's daughter, [name] continued to ask for full scope of care, CPR which I also explained would be futile if patient has a cardiac arrest.  I also requested to visit the patient in the hospital and discuss with palliative medicine for goals of care.  Discussed with Dr. Domingo Cocking.  Obesity Estimated body mass index is 31.01 kg/m as calculated from the following:   Height as of this encounter: _0  (1.702 m).   Weight as of this encounter: 89.8 kg.  Code Status: Full code DVT Prophylaxis:  Eliquis currently on hold due to ileus   Level of Care: Level of care: ICU Family Communication: Discussed with patient's son [name] face-to-face, discussed with patient's son [name] and daughter on the phone.   Disposition Plan:     Status is: Inpatient  Remains inpatient appropriate because:Inpatient level of care appropriate due to severity of illness   Dispo: The patient is from: [facility]              Anticipated d/c is to: LTAC              Patient currently is not medically stable to d/c.   Difficult to place patient No      Time Spent in minutes 45 minutes  Procedures:     Consultants:   [facility]  Antimicrobials:   Anti-infectives (From admission, onward)   Start     Dose/Rate Route Frequency Ordered Stop   04/19/20 1115  ceFEPIme (MAXIPIME) 2 g in sodium chloride 0.9 % 100 mL IVPB        2 g 200 mL/hr over 30 Minutes Intravenous Every 24 hours 04/18/20 1503 04/20/20 1051   04/19/20 1102  ceFEPIme (MAXIPIME) 2 g in sodium chloride 0.9 % 100 mL IVPB  Status:  Discontinued        2 g 200 mL/hr over 30 Minutes Intravenous Every 24 hours 04/18/20 1501 04/18/20 1503   04/17/20 1100  linezolid (ZYVOX) IVPB 600 mg        600 mg 300 mL/hr over 60 Minutes Intravenous Every 12 hours 04/17/20 1002 04/20/20 0032   04/16/20 1400  vancomycin (VANCOREADY) IVPB 1250 mg/250 mL  Status:  Discontinued  1,250 mg 166.7 mL/hr over 90 Minutes Intravenous Every 24 hours 04/15/20 1436 04/17/20 0813   04/16/20 0600  ceFEPIme (MAXIPIME) 2 g in sodium chloride 0.9 % 100 mL IVPB  Status:  Discontinued        2 g 200 mL/hr over 30 Minutes Intravenous Every 12 hours 04/15/20 2350 04/18/20 1501   04/16/20 0000  azithromycin (ZITHROMAX) 500 mg in sodium chloride 0.9 % 250 mL IVPB        500 mg 250 mL/hr over 60 Minutes Intravenous Every 24 hours 04/15/20 2350 04/20/20 0100   04/15/20 1700  piperacillin-tazobactam (ZOSYN) IVPB 3.375 g        3.375 g 12.5 mL/hr over 240 Minutes Intravenous  Once 04/15/20 1646 04/15/20 2101   04/15/20 1315  vancomycin (VANCOREADY) IVPB 1500 mg/300 mL        1,500 mg 150 mL/hr over 120 Minutes Intravenous  Once 04/15/20 1305 04/15/20 1617         Medications  Scheduled Meds: . amiodarone  200 mg Per Tube Daily  . chlorhexidine gluconate (MEDLINE KIT)  15 mL Mouth Rinse BID  . Chlorhexidine Gluconate Cloth  6 each Topical Daily  . Chlorhexidine Gluconate Cloth  6 each Topical Q0600  . darbepoetin (ARANESP) injection - DIALYSIS  100 mcg Intravenous Q Wed-HD  . guaiFENesin  5 mL Per Tube  Q12H  . insulin aspart  0-15 Units Subcutaneous Q4H  . mouth rinse  15 mL Mouth Rinse 10 times per day  . midodrine  5 mg Per Tube Q8H  . pantoprazole (PROTONIX) IV  40 mg Intravenous Q12H  . QUEtiapine  25 mg Per Tube QHS  . sodium chloride flush  10-40 mL Intracatheter Q12H   Continuous Infusions: . sodium chloride Stopped (05/05/20 1003)  . sodium chloride    . sodium chloride    . feeding supplement (VITAL AF 1.2 CAL) Stopped (05/06/20 0800)  . levETIRAcetam Stopped (05/08/20 2151)   PRN Meds:.sodium chloride, sodium chloride, acetaminophen, alteplase, dextrose, fentaNYL (SUBLIMAZE) injection, heparin, ipratropium-albuterol, lidocaine (PF), lidocaine-prilocaine, metoprolol tartrate, ondansetron (ZOFRAN) IV, oxyCODONE, pentafluoroprop-tetrafluoroeth, sodium chloride flush      Subjective:   Ameya Vowell was seen and examined today.  Chronically ill-appearing, not following any commands, alert and awake this morning.  No fevers.  Hemoglobin still low. Objective:   Vitals:   05/09/20 1200 05/09/20 1203 05/09/20 1300 05/09/20 1400  BP: (!) 109/98  (!) 113/93 117/73  Pulse: (!) 124  (!) 113 (!) 116  Resp: (!) 25  (!) 22 (!) 23  Temp:      TempSrc:      SpO2: 97% 98% 98% 98%  Weight:      Height:        Intake/Output Summary (Last 24 hours) at 05/09/2020 1445 Last data filed at 05/09/2020 1020 Gross per 24 hour  Intake 892.5 ml  Output 660 ml  Net 232.5 ml     Wt Readings from Last 3 Encounters:  05/09/20 89.8 kg   Physical Exam  General: Alert and awake, ill-appearing, on vent  Cardiovascular: S1 S2 clear, RRR. No pedal edema b/l  Respiratory: Coarse breath sounds bilaterally on vent via trach  Gastrointestinal: Soft, PEG tube  Ext: right AKA, contractures of the upper extremities, anasarca  Neuro: not following any commands  Musculoskeletal: No cyanosis, clubbing  Skin: No rashes  Psych: alert and awake however not responding to any verbal  commands    Data Reviewed:  I have personally reviewed following labs  and imaging studies  Micro Results No results found for this or any previous visit (from the past 240 hour(s)).  Radiology Reports CT ABDOMEN WO CONTRAST  Result Date: 04/15/2020 CLINICAL DATA:  Possible G-tube malpositioning EXAM: CT ABDOMEN WITHOUT CONTRAST TECHNIQUE: Multidetector CT imaging of the abdomen was performed following the standard protocol without IV contrast. COMPARISON:  CT chest same day FINDINGS: Lower chest: Cardiomegaly. Coronary artery calcifications. Small bilateral pleural effusions with patchy bibasilar opacities. Hepatobiliary: Unremarkable unenhanced appearance of the liver. No focal liver lesion identified. Gallbladder is decompressed. No hyperdense gallstone. No biliary dilatation. Pancreas: Unremarkable. No pancreatic ductal dilatation or surrounding inflammatory changes. Spleen: Grossly unremarkable. Adrenals/Urinary Tract: No definite adrenal nodule. Kidneys appear within normal limits. Excreted contrast within the bilateral renal collecting systems. No hydronephrosis. Stomach/Bowel: Percutaneous gastrostomy tube is appropriately positioned with balloon located in the anterior aspect of the distal gastric body. No evidence of outlet obstruction. Visualized bowel loops within the upper abdomen are within normal limits. No evidence of obstruction or inflammation. Vascular/Lymphatic: Extensive atherosclerosis throughout the aortoiliac axis. No upper abdominal lymphadenopathy. Other: No ascites or pneumoperitoneum. Musculoskeletal: No acute findings. IMPRESSION: 1. Percutaneous gastrostomy tube is appropriately positioned with balloon located intraluminally in the anterior aspect of the distal gastric body. No evidence of outlet obstruction. 2. No acute findings within the abdomen. 3. Small bilateral pleural effusions with patchy bibasilar opacities. Aortic Atherosclerosis (ICD10-I70.0). Electronically  Signed   By: Davina Poke D.O.   On: 04/15/2020 16:16   DG Chest 2 View  Result Date: 04/19/2020 CLINICAL DATA:  Dyspnea, diabetes mellitus EXAM: CHEST - 2 VIEW COMPARISON:  Portable exam 1504 hours compared to 04/17/2020 FINDINGS: Tracheostomy tube unchanged. Enlargement of cardiac silhouette. Atherosclerotic calcifications aorta. Extensive BILATERAL pulmonary infiltrates, slightly increased. This could represent pulmonary edema or multifocal pneumonia. Small LEFT pleural effusion. No pneumothorax or acute osseous findings. IMPRESSION: BILATERAL pulmonary infiltrates question pulmonary edema versus multifocal pneumonia. Enlargement of cardiac silhouette with small LEFT pleural effusion. Electronically Signed   By: Lavonia Dana M.D.   On: 04/19/2020 16:40   DG Abd 1 View  Result Date: 05/06/2020 CLINICAL DATA:  NG tube placement. EXAM: ABDOMEN - 1 VIEW COMPARISON:  KUB and chest x-ray earlier today. FINDINGS: Interval placement of nasogastric tube with tip and side-port over the stomach in the left upper quadrant. Visualized bowel gas pattern is nonobstructive. Remainder of the exam is unchanged. IMPRESSION: Nasogastric tube with tip and side-port over the stomach in the left upper quadrant. Electronically Signed   By: Marin Olp M.D.   On: 05/06/2020 16:53   DG Abd 1 View  Result Date: 05/06/2020 CLINICAL DATA:  Ileus. EXAM: ABDOMEN - 1 VIEW COMPARISON:  November 06, 2010 FINDINGS: The lung bases were not imaged. Air-filled large and small bowel suggest ileus. Early or partial small bowel obstruction not completely excluded as there is no gas in the region of the rectum. No free air, portal venous gas, or pneumatosis. The right hip is abnormal and there is either bony erosion or a fracture. IMPRESSION: 1. Air-filled prominent loops of large and small bowel favored to represent ileus given history. Early or partial small bowel obstruction not completely excluded. Recommend close attention on follow-up.  2. Abnormal right hip with either a comminuted fracture or bony erosion, incompletely evaluated. Recommend clinical correlation and dedicated imaging. Electronically Signed   By: Dorise Bullion III M.D   On: 05/06/2020 15:07   CT Angio Chest PE W and/or Wo Contrast  Result Date:  04/15/2020 CLINICAL DATA:  Tachycardia EXAM: CT ANGIOGRAPHY CHEST WITH CONTRAST TECHNIQUE: Multidetector CT imaging of the chest was performed using the standard protocol during bolus administration of intravenous contrast. Multiplanar CT image reconstructions and MIPs were obtained to evaluate the vascular anatomy. CONTRAST:  45m OMNIPAQUE IOHEXOL 350 MG/ML SOLN COMPARISON:  04/15/2020 FINDINGS: Cardiovascular: Satisfactory opacification of the pulmonary arteries to the segmental level. No evidence of pulmonary embolism. Thoracic aorta is nonaneurysmal. Common origin of the brachiocephalic and left common carotid arteries. Tortuosity of the descending thoracic aorta. Atherosclerotic calcifications of the aorta and coronary arteries. Heart size is mildly enlarged. No pericardial effusion. Mediastinum/Nodes: Mildly prominent mediastinal lymph nodes including 12 mm precarinal node (series 5, image 37). No enlarged axillary or hilar lymph nodes identified. Tracheostomy tube is present. Trachea and esophagus within normal limits. No significant findings within the thyroid gland. Lungs/Pleura: Small bilateral pleural effusions. Loculated pleural fluid accumulation within the minor fissure on the right and within the oblique fissure on the left. Patchy bibasilar airspace opacities. Diffuse interlobular septal thickening with ground-glass attenuation throughout both lung fields. No pneumothorax. Upper Abdomen: Although only partially visualized at the edge of the field of view. Patient's percutaneous gastrostomy tube appears malpositioned with balloon appearing extraluminal positioned anterior to the left hepatic lobe (series 5, image 97).  Musculoskeletal: No acute osseous abnormality.  No chest wall mass. Review of the MIP images confirms the above findings. IMPRESSION: 1. Negative for acute pulmonary embolism. 2. Findings suggestive of pulmonary edema with small bilateral pleural effusions. Loculated pleural fluid accumulation within the minor fissure on the right and within the oblique fissure on the left. 3. Patchy bibasilar airspace opacities may represent edema, atelectasis, or pneumonia. 4. Patient's percutaneous gastrostomy tube appears malpositioned although is incompletely visualized at the edge of the field of view. CT of the abdomen could be obtained to further evaluate. 5. Mildly prominent mediastinal lymph nodes, likely reactive. 6. Aortic and coronary artery atherosclerosis (ICD10-I70.0). Electronically Signed   By: NDavina PokeD.O.   On: 04/15/2020 15:00   [place]RENAL  Result Date: 04/18/2020 CLINICAL DATA:  Acute renal injury. EXAM: RENAL / URINARY TRACT ULTRASOUND COMPLETE COMPARISON:  CT abdomen pelvis 04/15/2020 FINDINGS: Right Kidney: Renal measurements: 10.5 x 3.9 x 4.5 cm = volume: 96 mL. Echogenicity within normal limits. No mass or hydronephrosis visualized. Left Kidney: Renal measurements: 9.3 x 4.8 x 4.1 cm = volume: 94 mL. Echogenicity within normal limits. No mass or hydronephrosis visualized. Bladder: Appears normal for degree of bladder distention. Other: Gallstones noted within the gallbladder lumen. At least trace simple free fluid noted within the pelvis. IMPRESSION: 1. Unremarkable renal ultrasound. 2. Cholelithiasis. 3. At least trace simple free fluid within the pelvis. Electronically Signed   By: MIven FinnM.D.   On: 04/18/2020 23:04   DG CHEST PORT 1 VIEW  Result Date: 05/06/2020 CLINICAL DATA:  Central line placement EXAM: PORTABLE CHEST 1 VIEW COMPARISON:  May 05, 2020 FINDINGS: There is a new right central line with the distal tip in the central SVC. No pneumothorax. Stable tracheostomy tube.  The cardiomediastinal silhouette is stable. Possible mild pulmonary venous congestion. Patchy infiltrate in the right lung, worsened in the interval. IMPRESSION: 1. Support apparatus as above. 2. New patchy pulmonary infiltrate on the right worrisome for pneumonia or aspiration. Recommend clinical correlation. 3. Possible mild pulmonary venous congestion. Electronically Signed   By: DDorise BullionIII M.D   On: 05/06/2020 15:04   DG CHEST PORT 1 VIEW  Result  Date: 05/05/2020 CLINICAL DATA:  Respiratory failure, history of CVA EXAM: PORTABLE CHEST 1 VIEW COMPARISON:  04/21/2020 chest radiograph. FINDINGS: Tracheostomy tube tip overlies the tracheal air column at the thoracic inlet. Stable cardiomediastinal silhouette with top-normal heart size. No pneumothorax. Patient's right upper extremity obscures the lateral right lung base. No significant pleural effusions. Similar hazy bibasilar lung opacities. No overt pulmonary edema. IMPRESSION: 1. Similar hazy bibasilar lung opacities, favor atelectasis, difficult to exclude a component of aspiration or pneumonia. 2. Tracheostomy tube appears well-positioned. Electronically Signed   By: Ilona Sorrel M.D.   On: 05/05/2020 08:09   DG Chest Port 1 View  Result Date: 04/21/2020 CLINICAL DATA:  Respiratory failure EXAM: PORTABLE CHEST 1 VIEW COMPARISON:  CT 04/15/2020, radiograph 04/20/2020 FINDINGS: Endotracheal tube tip terminates in the mid trachea, 5 cm from the carina. Telemetry leads and external support devices overlie the chest. The patient's right hand overlies the right lung base as well. Diffuse heterogeneous opacities again seen throughout both lungs, with some interval clearing particularly in the left mid lung. No pneumothorax. Suspect layering left effusion. No visible right effusion. Additional bandlike opacities favoring subsegmental atelectasis. Stable cardiomediastinal contours with a calcified aorta. No acute osseous or soft tissue abnormality.  IMPRESSION: Endotracheal tube tip terminates in the mid trachea, 5 cm from the carina. Improving bilateral airspace opacities may reflect some resolving edema or infection. Layering left effusion. Electronically Signed   By: Lovena Le M.D.   On: 04/21/2020 05:34   DG Chest Port 1 View  Result Date: 04/20/2020 CLINICAL DATA:  Acute respiratory distress EXAM: PORTABLE CHEST 1 VIEW COMPARISON:  04/19/2020 FINDINGS: Single frontal view of the chest demonstrates stable tracheostomy tube. Cardiac silhouette remains enlarged. Widespread interstitial and ground-glass opacities are unchanged since the prior study. Trace bilateral pleural effusions are noted. No pneumothorax. IMPRESSION: 1. Stable widespread interstitial and ground-glass opacities consistent with edema or pneumonia. 2. Stable trace bilateral pleural effusions. Electronically Signed   By: Randa Ngo M.D.   On: 04/20/2020 02:44   DG CHEST PORT 1 VIEW  Result Date: 04/17/2020 CLINICAL DATA:  Pneumonia. EXAM: PORTABLE CHEST 1 VIEW COMPARISON:  April 15, 2020. FINDINGS: Stable cardiomediastinal silhouette. Tracheostomy tube is in good position. No pneumothorax is noted. Small loculated left pleural effusion is noted. Stable bilateral lung opacities are noted concerning for pulmonary edema. Bony thorax is unremarkable. IMPRESSION: Stable bilateral lung opacities are noted concerning for pulmonary edema. Small loculated left pleural effusion is noted. Aortic Atherosclerosis (ICD10-I70.0). Electronically Signed   By: Marijo Conception M.D.   On: 04/17/2020 08:27   DG Chest Port 1 View  Result Date: 04/15/2020 CLINICAL DATA:  Tachycardia and wheezing EXAM: PORTABLE CHEST 1 VIEW COMPARISON:  November 06, 2010. FINDINGS: Tracheostomy catheter tip is 4.3 cm above the carina. No pneumothorax. There is apparent interstitial pulmonary edema with partially loculated small left pleural effusion. Ill-defined opacity noted in left base. There is cardiomegaly with  pulmonary venous hypertension. No adenopathy. There is aortic atherosclerosis. No bone lesions. IMPRESSION: Tracheostomy as described without evident pneumothorax. Cardiomegaly with a degree of pulmonary vascular congestion. Evidence of interstitial pulmonary edema with small loculated pleural effusion. The appearance is suspicious for a degree of underlying congestive heart failure. Focal airspace opacity in the left base is concerning for focus of pneumonia, likely superimposed on a degree of interstitial pulmonary edema. Aortic Atherosclerosis (ICD10-I70.0). Electronically Signed   By: Lowella Grip III M.D.   On: 04/15/2020 13:55   DG Abd Portable 1V  Result Date: 05/09/2020 CLINICAL DATA:  Adynamic ileus. EXAM: PORTABLE ABDOMEN - 1 VIEW COMPARISON:  05/08/2020.  05/06/2020. FINDINGS: NG tube noted with its tip in stable position in the stomach. No bowel distention. No free air. Mild residual contrast in the right colon. Aortoiliac and visceral atherosclerotic vascular calcification. Destructive changes again noted about the right hip. IMPRESSION: 1. NG tube noted with tip over the stomach in stable position. No bowel distention. 2.  Aortoiliac and visceral atherosclerotic vascular disease. 3.  Destructive changes again noted about the right hip. Electronically Signed   By: Marcello Moores  Register   On: 05/09/2020 05:06   DG Abd Portable 1V  Result Date: 05/08/2020 CLINICAL DATA:  Advanced nasogastric tube, evaluate position EXAM: PORTABLE ABDOMEN - 1 VIEW COMPARISON:  Abdominal radiograph obtained earlier today FINDINGS: The gastric tube has advanced. The tip now overlies the gastric antrum in good position. Right IJ non tunneled hemodialysis catheter with the tip overlying the mid SVC. Tracheostomy tube is present with the tip midline and at the level of the clavicles. Stable cardiomegaly. Atherosclerotic calcification present in the transverse aorta. Marked pulmonary vascular congestion with mild  interstitial edema. Unremarkable bowel gas pattern. IMPRESSION: 1. Well-positioned NG tube with the tip overlying the gastric antrum. 2. Right IJ non tunneled hemodialysis catheter with the tip at the mid SVC. 3. Well-positioned tracheostomy tube. 4. Stable cardiomegaly with pulmonary vascular congestion and mild interstitial edema. 5. Aortic atherosclerosis. Electronically Signed   By: Jacqulynn Cadet M.D.   On: 05/08/2020 07:55   DG Abd Portable 1V  Result Date: 05/08/2020 CLINICAL DATA:  Ileus. EXAM: PORTABLE ABDOMEN - 1 VIEW COMPARISON:  05/06/2020. FINDINGS: NG tube tip noted in the upper most portion of the stomach. Advancement of approximately 10 cm should be considered. Multiple air-filled loops of small and large bowel noted suggesting adynamic ileus. No prominent bowel distention. No free air identified. Hemidiaphragms incompletely imaged. Aortoiliac and visceral atherosclerotic vascular calcification. Radiopacity over the right lower quadrant may represent residual barium. Degenerative change lumbar spine and both hips. IMPRESSION: 1. NG tube tip noted in the upper most portion stomach. Advancement of approximately 10 cm should be considered. 2. Multiple air-filled loops of small and large bowel noted suggesting adynamic ileus. No prominent bowel distention. 3.  Aortoiliac and visceral atherosclerotic vascular disease. Electronically Signed   By: Marcello Moores  Register   On: 05/08/2020 05:42   ECHOCARDIOGRAM COMPLETE  Result Date: 04/16/2020    ECHOCARDIOGRAM REPORT   Patient Name:   BREKLYN FABRIZIO Date of Exam: 04/16/2020 Medical Rec #:  [id number]   Height:       67.0 in Accession #:    [id number]  Weight:       173.3 lb Date of Birth:  06-19-1940   BSA:          1.903 m Patient Age:    84 years    BP:           122/77 mmHg Patient Gender: F           HR:           72 bpm. Exam Location:  Inpatient Procedure: 2D Echo, Cardiac Doppler and Color Doppler Indications:    I48.2 Chronic atrial fibrillation;  I48.91* Unspeicified atrial                 fibrillation  History:        Patient has no prior history of Echocardiogram examinations.  Stroke; Risk Factors:Diabetes.  Sonographer:    Bernadene Person RDCS Referring Phys: [id number] [facility] A CHANDRASEKHAR IMPRESSIONS  1. Left ventricular ejection fraction, by estimation, is 55 to 60%. The left ventricle has normal function. The left ventricle has no regional wall motion abnormalities. Left ventricular diastolic function could not be evaluated.  2. Right ventricular systolic function is normal. The right ventricular size is normal. There is moderately elevated pulmonary artery systolic pressure.  3. Left atrial size was mildly dilated.  4. The mitral valve is grossly normal. Mild to moderate mitral valve regurgitation.  5. Tricuspid valve regurgitation is moderate.  6. The aortic valve is calcified. There is mild calcification of the aortic valve. There is mild thickening of the aortic valve. Aortic valve regurgitation is mild.  7. Aortic dilatation noted. There is mild dilatation of the ascending aorta, measuring 39 mm. FINDINGS  Left Ventricle: Left ventricular ejection fraction, by estimation, is 55 to 60%. The left ventricle has normal function. The left ventricle has no regional wall motion abnormalities. The left ventricular internal cavity size was normal in size. There is  no left ventricular hypertrophy. Left ventricular diastolic function could not be evaluated due to atrial fibrillation. Left ventricular diastolic function could not be evaluated. Right Ventricle: The right ventricular size is normal. No increase in right ventricular wall thickness. Right ventricular systolic function is normal. There is moderately elevated pulmonary artery systolic pressure. The tricuspid regurgitant velocity is 3.60 m/s, and with an assumed right atrial pressure of 8 mmHg, the estimated right ventricular systolic pressure is 22.2 mmHg. Left Atrium: Left atrial  size was mildly dilated. Right Atrium: Right atrial size was normal in size. Pericardium: There is no evidence of pericardial effusion. Mitral Valve: The mitral valve is grossly normal. There is moderate thickening of the mitral valve leaflet(s). There is mild calcification of the mitral valve leaflet(s). Mild to moderate mitral annular calcification. Mild to moderate mitral valve regurgitation. Tricuspid Valve: The tricuspid valve is normal in structure. Tricuspid valve regurgitation is moderate. Aortic Valve: The aortic valve is calcified. There is mild calcification of the aortic valve. There is mild thickening of the aortic valve. Aortic valve regurgitation is mild. Aortic regurgitation PHT measures 455 msec. Pulmonic Valve: The pulmonic valve was normal in structure. Pulmonic valve regurgitation is not visualized. Aorta: Aortic dilatation noted. There is mild dilatation of the ascending aorta, measuring 39 mm. IAS/Shunts: The atrial septum is grossly normal.  LEFT VENTRICLE PLAX 2D LVIDd:         5.10 cm LVIDs:         3.90 cm LV PW:         0.90 cm LV IVS:        1.00 cm LVOT diam:     2.00 cm LV SV:         49 LV SV Index:   26 LVOT Area:     3.14 cm  LV Volumes (MOD) LV vol d, MOD A2C: 96.4 ml LV vol d, MOD A4C: 84.5 ml LV vol s, MOD A2C: 39.2 ml LV vol s, MOD A4C: 40.4 ml LV SV MOD A2C:     57.2 ml LV SV MOD A4C:     84.5 ml LV SV MOD BP:      52.9 ml RIGHT VENTRICLE RV S prime:     6.94 cm/s TAPSE (M-mode): 1.1 cm LEFT ATRIUM             Index  RIGHT ATRIUM           Index LA diam:        3.70 cm 1.94 cm/m  RA Area:     17.00 cm LA Vol (A2C):   52.0 ml 27.33 ml/m RA Volume:   46.10 ml  24.23 ml/m LA Vol (A4C):   68.7 ml 36.11 ml/m LA Biplane Vol: 51.3 ml 26.96 ml/m  AORTIC VALVE LVOT Vmax:   93.38 cm/s LVOT Vmean:  63.350 cm/s LVOT VTI:    0.156 m AI PHT:      455 msec  AORTA Ao Root diam: 3.30 cm Ao Asc diam:  3.90 cm MR Peak grad:    89.1 mmHg   TRICUSPID VALVE MR Mean grad:    61.0 mmHg   TR  Peak grad:   51.8 mmHg MR Vmax:         472.00 cm/s TR Vmax:        360.00 cm/s MR Vmean:        370.0 cm/s MR PISA:         0.57 cm    SHUNTS MR PISA Eff ROA: 5 mm       Systemic VTI:  0.16 m MR PISA Radius:  0.30 cm     Systemic Diam: 2.00 cm Mertie Moores MD Electronically signed by Mertie Moores MD Signature Date/Time: 04/16/2020/4:09:55 PM    Final     Lab Data:  CBC: Recent Labs  Lab 05/05/20 0105 05/06/20 0520 05/07/20 0444 05/08/20 0406 05/08/20 1301 05/08/20 2025 05/09/20 0344 05/09/20 0612 05/09/20 1238  WBC 6.6 7.9 11.0* 10.6*  --   --  9.9  --   --   NEUTROABS 5.4 6.2 8.9* 9.2*  --   --  7.3  --   --   HGB 7.5* 8.3* 8.9* 7.0* 6.1* 7.4* 7.0* 6.9* 7.9*  HCT 24.5* 26.4* 28.3* 22.3* 20.3* 23.5* 21.2* 21.3* 24.8*  MCV 94.2 94.6 93.7 96.1  --   --  93.0  --   --   PLT 187 217 270 229  --   --  171  --   --    Basic Metabolic Panel: Recent Labs  Lab 05/04/20 0024 05/05/20 0105 05/06/20 0520 05/07/20 0444 05/08/20 0406 05/09/20 0344  NA 133* 135 135 137 139 140  K 5.0 5.1 4.8 4.7 4.1 4.3  CL 96* 98 97* 97* 100 102  CO2 24 21* 21* 21* 24 22  GLUCOSE 182* 181* 247* 133* 133* 152*  BUN 169* 182* 209* 209* 132* 162*  CREATININE 4.68* 4.51* 4.66* 4.92* 3.46* 3.95*  CALCIUM 8.5* 8.9 9.3 9.8 9.3 9.1  MG 1.9  --   --   --   --  2.1  PHOS  --   --  6.8* 7.3* 5.9* 7.3*   GFR: Estimated Creatinine Clearance: 13.7 mL/min (A) (by C-G formula based on SCr of 3.95 mg/dL (H)). Liver Function Tests: Recent Labs  Lab 05/05/20 0105 05/06/20 0520 05/07/20 0444 05/08/20 0406 05/09/20 0344  ALBUMIN 3.4* 3.5 3.3* 2.9* 2.6*   No results for input(s): LIPASE, AMYLASE in the last 168 hours. No results for input(s): AMMONIA in the last 168 hours. Coagulation Profile: No results for input(s): INR, PROTIME in the last 168 hours. Cardiac Enzymes: No results for input(s): CKTOTAL, CKMB, CKMBINDEX, TROPONINI in the last 168 hours. BNP (last 3 results) No results for input(s):  PROBNP in the last 8760 hours. HbA1C: No results for input(s): HGBA1C in the last 72  hours. CBG: Recent Labs  Lab 05/08/20 1924 05/08/20 2317 05/09/20 0331 05/09/20 0720 05/09/20 1153  GLUCAP 148* 144* 149* 144* 149*   Lipid Profile: No results for input(s): CHOL, HDL, LDLCALC, TRIG, CHOLHDL, LDLDIRECT in the last 72 hours. Thyroid Function Tests: No results for input(s): TSH, T4TOTAL, FREET4, T3FREE, THYROIDAB in the last 72 hours. Anemia Panel: No results for input(s): VITAMINB12, FOLATE, FERRITIN, TIBC, IRON, RETICCTPCT in the last 72 hours. Urine analysis:    Component Value Date/Time   COLORURINE YELLOW 04/15/2020 1452   APPEARANCEUR HAZY (A) 04/15/2020 1452   LABSPEC 1.010 04/15/2020 1452   PHURINE 5.0 04/15/2020 1452   GLUCOSEU NEGATIVE 04/15/2020 1452   HGBUR MODERATE (A) 04/15/2020 1452   BILIRUBINUR NEGATIVE 04/15/2020 1452   KETONESUR NEGATIVE 04/15/2020 1452   PROTEINUR NEGATIVE 04/15/2020 1452   NITRITE NEGATIVE 04/15/2020 1452   LEUKOCYTESUR LARGE (A) 04/15/2020 1452     Raelie Lohr M.D. [facility] 05/09/2020, 2:45 PM  Available via Epic secure chat 7am-7pm After 7 pm, please refer to night coverage provider listed on amion.

## 2020-05-09 NOTE — Progress Notes (Signed)
Brief Nutrition Note  Tube feeds have been held due to ileus. Received RD consult from MD: "Resume trickle feeds at 20 cc an hour, please do not increase. Will reassess in a.m. if she is able to tolerate."  Do NOT recommend Nepro formula due to high fat content which may cause intolerance in pt with recent ileus.  RD has ordered: - Vital AF 1.2 @ 20 ml/hr (480 ml/day) via PEG  Trickle tube feeding regimen provides 576 kcal, 18 grams of protein, and 389 ml of H2O.   Goal tube feeding regimen is as follows: - Vital AF 1.2 @ 60 ml/hr (1440 ml/day)  Goal tube feeding regimen would provide1728kcal, 108grams of protein, and 1133m of H2O (meeting 100% of estimated need).  RD will follow for ability to increase tube feeds to goal.   KGustavus Bryant MS, RD, LDN Inpatient Clinical Dietitian Please see AMiON for contact information.

## 2020-05-09 NOTE — Progress Notes (Signed)
[facility] Progress Note  CC:  Ileus, hematochezia   Subjective: Patient is unresponsive with tracheostomy on the vent.  Her son [name] was present earlier this morning as reported by [employer]. No family at the bedside at this time.   Nursing staff reported the patient's daughter and primary Ko Vaya requests full care. Patient remains a full code.    Objective:  Vital signs in last 24 hours: Temp:  [97.3 F (36.3 C)-99.7 F (37.6 C)] 98.5 F (36.9 C) (04/06 1100) Pulse Rate:  [109-125] 124 (04/06 1200) Resp:  [0-34] 25 (04/06 1200) BP: (96-130)/(58-98) 109/98 (04/06 1200) SpO2:  [94 %-100 %] 97 % (04/06 1200) FiO2 (%):  [30 %-100 %] 30 % (04/06 0844) Weight:  [89.8 kg] 89.8 kg (04/06 0349) Last BM Date: 05/09/20   General:  Unresponsive.  Eyes: upward gaze.  Heart: Tachycardic. No murmur.  Pulm:  Coarse breath sounds.  Abdomen: Obese abdomen with anasarca, few bowel sounds auscultated with NGT clamped. Small amount or red blood in NGT. RN flushed NGT with small amount of blood, no clots.  Extremities:  Anasarca. LLE 1+ edema. RUE contracture. Right AKA.  Neurologic:  Alert and  oriented x4;  grossly normal neurologically. Psych:  Alert and cooperative. Normal mood and affect.  Intake/Output from previous day: 04/05 0701 - 04/06 0700 In: 542.5 [Blood:352.5; NG/GT:30; IV Piggyback:100] Out: 1135 [Urine:525; Emesis/NG output:10; Stool:600] Intake/Output this shift: Total I/O In: 350 [Blood:350] Out: -   Lab Results: Recent Labs    05/07/20 0444 05/08/20 0406 05/08/20 1301 05/08/20 2025 05/09/20 0344 05/09/20 0612  WBC 11.0* 10.6*  --   --  9.9  --   HGB 8.9* 7.0*   < > 7.4* 7.0* 6.9*  HCT 28.3* 22.3*   < > 23.5* 21.2* 21.3*  PLT 270 229  --   --  171  --    < > = values in this interval not displayed.   BMET Recent Labs    05/07/20 0444 05/08/20 0406 05/09/20 0344  NA 137 139 140  K 4.7 4.1 4.3  CL 97* 100 102  CO2 21* 24  22  GLUCOSE 133* 133* 152*  BUN 209* 132* 162*  CREATININE 4.92* 3.46* 3.95*  CALCIUM 9.8 9.3 9.1   LFT Recent Labs    05/09/20 0344  ALBUMIN 2.6*   PT/INR No results for input(s): LABPROT, INR in the last 72 hours. Hepatitis Panel Recent Labs    05/07/20 1439  HEPBSAG NON REACTIVE    DG Abd Portable 1V  Result Date: 05/09/2020 CLINICAL DATA:  Adynamic ileus. EXAM: PORTABLE ABDOMEN - 1 VIEW COMPARISON:  05/08/2020.  05/06/2020. FINDINGS: NG tube noted with its tip in stable position in the stomach. No bowel distention. No free air. Mild residual contrast in the right colon. Aortoiliac and visceral atherosclerotic vascular calcification. Destructive changes again noted about the right hip. IMPRESSION: 1. NG tube noted with tip over the stomach in stable position. No bowel distention. 2.  Aortoiliac and visceral atherosclerotic vascular disease. 3.  Destructive changes again noted about the right hip. Electronically Signed   By: Marcello Moores  Register   On: 05/09/2020 05:06   DG Abd Portable 1V  Result Date: 05/08/2020 CLINICAL DATA:  Advanced nasogastric tube, evaluate position EXAM: PORTABLE ABDOMEN - 1 VIEW COMPARISON:  Abdominal radiograph obtained earlier today FINDINGS: The gastric tube has advanced. The tip now overlies the gastric antrum in good position. Right IJ non tunneled hemodialysis catheter with  the tip overlying the mid SVC. Tracheostomy tube is present with the tip midline and at the level of the clavicles. Stable cardiomegaly. Atherosclerotic calcification present in the transverse aorta. Marked pulmonary vascular congestion with mild interstitial edema. Unremarkable bowel gas pattern. IMPRESSION: 1. Well-positioned NG tube with the tip overlying the gastric antrum. 2. Right IJ non tunneled hemodialysis catheter with the tip at the mid SVC. 3. Well-positioned tracheostomy tube. 4. Stable cardiomegaly with pulmonary vascular congestion and mild interstitial edema. 5. Aortic  atherosclerosis. Electronically Signed   By: Jacqulynn Cadet M.D.   On: 05/08/2020 07:55   DG Abd Portable 1V  Result Date: 05/08/2020 CLINICAL DATA:  Ileus. EXAM: PORTABLE ABDOMEN - 1 VIEW COMPARISON:  05/06/2020. FINDINGS: NG tube tip noted in the upper most portion of the stomach. Advancement of approximately 10 cm should be considered. Multiple air-filled loops of small and large bowel noted suggesting adynamic ileus. No prominent bowel distention. No free air identified. Hemidiaphragms incompletely imaged. Aortoiliac and visceral atherosclerotic vascular calcification. Radiopacity over the right lower quadrant may represent residual barium. Degenerative change lumbar spine and both hips. IMPRESSION: 1. NG tube tip noted in the upper most portion stomach. Advancement of approximately 10 cm should be considered. 2. Multiple air-filled loops of small and large bowel noted suggesting adynamic ileus. No prominent bowel distention. 3.  Aortoiliac and visceral atherosclerotic vascular disease. Electronically Signed   By: Marcello Moores  Register   On: 05/08/2020 05:42    Assessment / Plan:  1. 59 year with a complex medical history including CVA with right sided hemiparesis, chronic respiratory failure s/p trache and PEG placement 02/2020 admitted to the hospital 04/15/2020 with atrial fibrillation with RVR with subsequent development of  acute on chronic anemia with acute UGI bleeding with red blood with clots from her NG tube and maroon blood from the flexi seal bag on 4/5. Hg 7.0 ->6.1. Transfused one unit of PRBCs -> Hg 7.4. Approximately 100cc of maroon colored blood in flexi seal this am. Today Hg 6.9. Transfused 1 unit of PRBCs. Post transfusion H/H pending.  -Continue PPI IV bid -Clamp NGT for now.   -Agree with PRBC transfusion. Repeat H/H post transfusion -Continue to hold Eliquis  -No plans for endoscopic evaluation at this time as patient is at high risk for procedure complications secondary to  numerous comorbidities. Transfuse to maintain Hg> 7.   Further recommendation per Dr. Tarri Glenn. -Palliative care recommended   2. Respiratory failure s/p trache on the ventilator -Management per the medical team   3.  Paroxysmal atrial fibrillation with RVR. On Amiodarone. Eliquis on hold since 4/1.   4.  Chronic anemia, multifactorial with GI bleed as noted in # 1  5. Adynamic ileus per abdominal xray without bowel distension 4/5. K+ 4.1.  -Check K+ and Magnesium level in am -Repeat abdominal xray in am -Turn patient Q 2 hours if tolerated   6. Acute Renal failure. RIJ dialysis catheter. S/P first HD on 4/4, 2nd HD session scheduled today.   7. DM II   Brendolyn Patty  Principal Problem:   Atrial fibrillation with rapid ventricular response (HCC) Active Problems:   History of CVA (cerebrovascular accident)   Normocytic anemia   Tracheostomy dependence (HCC)   Hypotension   Chronic pulmonary aspiration   Seizure disorder (HCC)   Acute on chronic respiratory failure with hypoxia (HCC)   Pneumonia   Hypertension associated with diabetes ([place])   Acute respiratory distress   AKI (acute kidney injury) ([place])  VRE (vancomycin resistant enterococcus) culture positive   Pressure injury of skin   Chronic respiratory failure ([place])   Palliative care by specialist   Goals of care, counseling/discussion     LOS: 24 days   Noralyn Pick  05/09/2020, 12:29 PM

## 2020-05-09 NOTE — Progress Notes (Signed)
Pts son, [name], at bedside and requesting update from a doctor. Dr. Tana Coast informed via secure chat.

## 2020-05-09 NOTE — Progress Notes (Signed)
[name], patient's son in conference room currently speaking with Dr. Tana Coast

## 2020-05-09 NOTE — Progress Notes (Signed)
   05/09/20 0944  Clinical Encounter Type  Visited With Health care provider  Visit Type Initial  Referral From Nurse  Consult/Referral To Chaplain   Chaplains answered call from Pt's nurse, [place] regarding Pt's HCPOA. There are three people listed on the document. After reviewing Pt's chart, Chaplain Ellen's second chart note on 05/02/20 clarifies the situation.  "The chaplain understands after reading the notarized HCPOA the Pt. has appointed:  1-Ervinda Brunson, 2-Juan Vicente Serene, and Adria Devon to serve as her healthcare agents. 'My designated health care agents shall serve alone, in the order named.'"   [facility] called Nurse Melanie back with this information.  This note was prepared by Chaplain Resident, Dante Gang, MDiv. Chaplain remains available as needed through the on-call pager: [phone].

## 2020-05-09 NOTE — Progress Notes (Signed)
The patient's daughter, [name], is here and requesting to speak with a doctor

## 2020-05-09 NOTE — Progress Notes (Signed)
[facility] NEPHROLOGY PROGRESS NOTE  Assessment/ Plan: Pt is a 80 y.o. yo female  with PMH of DM, HFpEF, CVA with right-sided hemiparesis/contracture, s/p right BKA, PEG tube, chronic respiratory failure status post tracheostomy, she was recently at [facility] where treated for HCAP developed A. fib with RVR, hypotension and was sent to ER.  Acute kidney injury: Cr 1 as of 04/15/20 now with severe AKI. Suspect hemodynamically mediated AKI in the setting of A. fib with RVR, IV contrast, recent infection.  UA without proteinuria or microscopic hematuria.  Kidney ultrasound ruled out obstruction.   -Her UOP was improving through the week with stable Cr but in the setting of worsening azotemia was given a trial of albumin volume expansion without improvement.  -Have had numerous discussions with Lolita Patella re: dialysis.  Recommend against it in light of comorbids and not a candidate for long term.  She desires a short term trial to see if a bit more time will improve renal function.  s/p temp cath 05/06/20 via PCCM and we will do a 1 week trial of HD.  If unable to liberate from HD would need to move to palliative measures.  Dr Johnney Ou discussed with dtr 05/06/20.  Of note palliative care and ethics involved in discussion too - Continue max supportive care. -daily labs, monitor strict I/O please - tolerated HD 05/07/20, next HD planned 05/09/20.  Following UOP and rate of rise between rx.  She has developed a severe GI bleed with Hgb down to 6.1, has gotten pRBCs.  It appears that maximal life support has remained ineffective for this patient.  I support transition to comfort.   Hypotension, h/o HTN - on midodrine with normal BP  Acute on chronic hypoxic respiratory failure, h/o trach: Currently on vent in ICU.  PCCM is following.  Severe sepsis/Healthcare associated pneumonia/septic shock: s/p broad-spectrum antibiotics per primary team.  Off pressors, on midodrine  A. fib with RVR:  On dilt, eliquis being held   Malnutrition:  Supplement per RD.  GI bleed: severity of illness precludes scopes per GI  Subjective:  Developed pretty significant GI bleed yesterday, Dr Loletha Carrow consulted.  Too unstable for scopes  Objective Vital signs in last 24 hours: Vitals:   05/09/20 0806 05/09/20 0824 05/09/20 0844 05/09/20 0900  BP:   103/71 109/66  Pulse:  (!) 114 (!) 116 (!) 113  Resp:  20 19 (!) 23  Temp:  99.7 F (37.6 C) 99.7 F (37.6 C)   TempSrc:  Axillary Axillary   SpO2: 98% 98% 99% 98%  Weight:      Height:       Weight change: 0.6 kg  Intake/Output Summary (Last 24 hours) at 05/09/2020 1127 Last data filed at 05/09/2020 0600 Gross per 24 hour  Intake 542.5 ml  Output 910 ml  Net -367.5 ml       Labs: Basic Metabolic Panel: Recent Labs  Lab 05/07/20 0444 05/08/20 0406 05/09/20 0344  NA 137 139 140  K 4.7 4.1 4.3  CL 97* 100 102  CO2 21* 24 22  GLUCOSE 133* 133* 152*  BUN 209* 132* 162*  CREATININE 4.92* 3.46* 3.95*  CALCIUM 9.8 9.3 9.1  PHOS 7.3* 5.9* 7.3*   Liver Function Tests: Recent Labs  Lab 05/07/20 0444 05/08/20 0406 05/09/20 0344  ALBUMIN 3.3* 2.9* 2.6*   No results for input(s): LIPASE, AMYLASE in the last 168 hours. No results for input(s): AMMONIA in the last 168 hours. CBC: Recent Labs  Lab 05/05/20 0105 05/06/20 0520 05/07/20 0444 05/08/20 0406 05/08/20 1301 05/08/20 2025 05/09/20 0344 05/09/20 0612  WBC 6.6 7.9 11.0* 10.6*  --   --  9.9  --   NEUTROABS 5.4 6.2 8.9* 9.2*  --   --  7.3  --   HGB 7.5* 8.3* 8.9* 7.0*   < > 7.4* 7.0* 6.9*  HCT 24.5* 26.4* 28.3* 22.3*   < > 23.5* 21.2* 21.3*  MCV 94.2 94.6 93.7 96.1  --   --  93.0  --   PLT 187 217 270 229  --   --  171  --    < > = values in this interval not displayed.   Cardiac Enzymes: No results for input(s): CKTOTAL, CKMB, CKMBINDEX, TROPONINI in the last 168 hours. CBG: Recent Labs  Lab 05/08/20 1514 05/08/20 1924 05/08/20 2317 05/09/20 0331  05/09/20 0720  GLUCAP 132* 148* 144* 149* 144*    Iron Studies:  No results for input(s): IRON, TIBC, TRANSFERRIN, FERRITIN in the last 72 hours. Studies/Results: DG Abd Portable 1V  Result Date: 05/09/2020 CLINICAL DATA:  Adynamic ileus. EXAM: PORTABLE ABDOMEN - 1 VIEW COMPARISON:  05/08/2020.  05/06/2020. FINDINGS: NG tube noted with its tip in stable position in the stomach. No bowel distention. No free air. Mild residual contrast in the right colon. Aortoiliac and visceral atherosclerotic vascular calcification. Destructive changes again noted about the right hip. IMPRESSION: 1. NG tube noted with tip over the stomach in stable position. No bowel distention. 2.  Aortoiliac and visceral atherosclerotic vascular disease. 3.  Destructive changes again noted about the right hip. Electronically Signed   By: Marcello Moores  Register   On: 05/09/2020 05:06   DG Abd Portable 1V  Result Date: 05/08/2020 CLINICAL DATA:  Advanced nasogastric tube, evaluate position EXAM: PORTABLE ABDOMEN - 1 VIEW COMPARISON:  Abdominal radiograph obtained earlier today FINDINGS: The gastric tube has advanced. The tip now overlies the gastric antrum in good position. Right IJ non tunneled hemodialysis catheter with the tip overlying the mid SVC. Tracheostomy tube is present with the tip midline and at the level of the clavicles. Stable cardiomegaly. Atherosclerotic calcification present in the transverse aorta. Marked pulmonary vascular congestion with mild interstitial edema. Unremarkable bowel gas pattern. IMPRESSION: 1. Well-positioned NG tube with the tip overlying the gastric antrum. 2. Right IJ non tunneled hemodialysis catheter with the tip at the mid SVC. 3. Well-positioned tracheostomy tube. 4. Stable cardiomegaly with pulmonary vascular congestion and mild interstitial edema. 5. Aortic atherosclerosis. Electronically Signed   By: Jacqulynn Cadet M.D.   On: 05/08/2020 07:55   DG Abd Portable 1V  Result Date:  05/08/2020 CLINICAL DATA:  Ileus. EXAM: PORTABLE ABDOMEN - 1 VIEW COMPARISON:  05/06/2020. FINDINGS: NG tube tip noted in the upper most portion of the stomach. Advancement of approximately 10 cm should be considered. Multiple air-filled loops of small and large bowel noted suggesting adynamic ileus. No prominent bowel distention. No free air identified. Hemidiaphragms incompletely imaged. Aortoiliac and visceral atherosclerotic vascular calcification. Radiopacity over the right lower quadrant may represent residual barium. Degenerative change lumbar spine and both hips. IMPRESSION: 1. NG tube tip noted in the upper most portion stomach. Advancement of approximately 10 cm should be considered. 2. Multiple air-filled loops of small and large bowel noted suggesting adynamic ileus. No prominent bowel distention. 3.  Aortoiliac and visceral atherosclerotic vascular disease. Electronically Signed   By: Marcello Moores  Register   On: 05/08/2020 05:42    Medications: Infusions: . sodium chloride  Stopped (05/05/20 1003)  . sodium chloride    . sodium chloride    . feeding supplement (VITAL AF 1.2 CAL) Stopped (05/06/20 0800)  . levETIRAcetam Stopped (05/08/20 2151)    Scheduled Medications: . amiodarone  200 mg Per Tube Daily  . chlorhexidine gluconate (MEDLINE KIT)  15 mL Mouth Rinse BID  . Chlorhexidine Gluconate Cloth  6 each Topical Daily  . Chlorhexidine Gluconate Cloth  6 each Topical Q0600  . darbepoetin (ARANESP) injection - DIALYSIS  100 mcg Intravenous Q Wed-HD  . guaiFENesin  5 mL Per Tube Q12H  . insulin aspart  0-15 Units Subcutaneous Q4H  . mouth rinse  15 mL Mouth Rinse 10 times per day  . midodrine  5 mg Per Tube Q8H  . pantoprazole (PROTONIX) IV  40 mg Intravenous Q12H  . QUEtiapine  25 mg Per Tube QHS  . sodium chloride flush  10-40 mL Intracatheter Q12H    have reviewed scheduled and prn medications.  Physical Exam: General: Chronically ill looking female, on mechanical  ventilation HEENT: NGT   Neck: +trach Heart:RRR, s1s2 nl Lungs: coarse BL, on vent via trach Abdomen:soft,  non-distended Extremities: 1+ anasarca, Contracture of upper extremities, right AKA. Neurology: pretty lethargic   Lya Holben 05/09/2020,11:27 AM  LOS: 24 days

## 2020-05-09 NOTE — Progress Notes (Signed)
Pt not able to receive CPT. Pt is receiving dialysis.

## 2020-05-09 NOTE — Progress Notes (Signed)
Daily Progress Note   Patient Name: Rebecca Harrell       Date: 05/09/2020 DOB: 11-May-1940  Age: 80 y.o. MRN#: [id number] Attending Physician: Mendel Corning, MD Primary Care Physician: Aaron Edelman, MD Admit Date: 04/15/2020  Reason for Consultation/Follow-up: Establishing goals of care  Subjective: I saw and examined Ms. Nunziato today.  Met with her daughter/HCPOA for family meeting.  Patient's sister (retired Therapist, sports) joined [name] for meeting as well.  See below.  Length of Stay: 24  Current Medications: Scheduled Meds:  . amiodarone  200 mg Per Tube Daily  . chlorhexidine gluconate (MEDLINE KIT)  15 mL Mouth Rinse BID  . Chlorhexidine Gluconate Cloth  6 each Topical Daily  . Chlorhexidine Gluconate Cloth  6 each Topical Q0600  . darbepoetin (ARANESP) injection - DIALYSIS  100 mcg Intravenous Q Wed-HD  . feeding supplement (VITAL AF 1.2 CAL)  1,000 mL Per Tube Q24H  . guaiFENesin  5 mL Per Tube Q12H  . insulin aspart  0-15 Units Subcutaneous Q4H  . mouth rinse  15 mL Mouth Rinse 10 times per day  . midodrine  5 mg Per Tube Q8H  . pantoprazole (PROTONIX) IV  40 mg Intravenous Q12H  . QUEtiapine  25 mg Per Tube QHS  . sodium chloride flush  10-40 mL Intracatheter Q12H    Continuous Infusions: . sodium chloride Stopped (05/05/20 1003)  . sodium chloride    . sodium chloride    . amiodarone 60 mg/hr (05/09/20 2200)   Followed by  . [START ON 05/10/2020] amiodarone    . levETIRAcetam 400 mL/hr at 05/09/20 2200    PRN Meds: sodium chloride, sodium chloride, acetaminophen, alteplase, dextrose, fentaNYL (SUBLIMAZE) injection, heparin, ipratropium-albuterol, lidocaine (PF), lidocaine-prilocaine, metoprolol tartrate, ondansetron (ZOFRAN) IV, oxyCODONE,  pentafluoroprop-tetrafluoroeth, sodium chloride flush  Physical Exam         Patient has a trach and PEG Appears with contractures and generalized weakness Awake and tracks me in the room but not as alert, does not mouth words Now has HD catheter in IJ in neck  No distress, however, appears chronically ill and fatigued.  Coarse breath sounds    Vital Signs: BP 104/60 (BP Location: Left Wrist)   Pulse (!) 112   Temp 98.6 F (37 C) (Oral)   Resp (!) 25   Ht  _0  (1.702 m)   Wt 88.8 kg   SpO2 97%   BMI 30.66 kg/m  SpO2: SpO2: 97 % O2 Device: O2 Device: Ventilator O2 Flow Rate: O2 Flow Rate (L/min): 40 L/min  Intake/output summary:   Intake/Output Summary (Last 24 hours) at 05/09/2020 2249 Last data filed at 05/09/2020 2200 Gross per 24 hour  Intake 484.08 ml  Output 2025 ml  Net -1540.92 ml   LBM: Last BM Date: 05/09/20 Baseline Weight: Weight: 77.1 kg Most recent weight: Weight: 88.8 kg      PPS 40% Palliative Assessment/Data:    Flowsheet Rows   Flowsheet Row Most Recent Value  Intake Tab   Referral Department Hospitalist  Unit at Time of Referral Cardiac/Telemetry Unit  Palliative Care Primary Diagnosis Pulmonary  Date Notified 04/19/20  Palliative Care Type New Palliative care  Reason for referral Clarify Goals of Care  Date of Admission 04/15/20  Date first seen by Palliative Care 04/20/20  # of days Palliative referral response time 1 Day(s)  # of days IP prior to Palliative referral 4  Clinical Assessment   Psychosocial & Spiritual Assessment   Palliative Care Outcomes       Patient Active Problem List   Diagnosis Date Noted  . Chronic respiratory failure ([place])   . Palliative care by specialist   . Goals of care, counseling/discussion   . Pressure injury of skin 04/20/2020  . Acute respiratory distress   . AKI (acute kidney injury) ([place])   . VRE (vancomycin resistant enterococcus) culture positive   . Hypertension associated with diabetes ([place])    . Acute pulmonary edema ([facility])   . Atrial fibrillation with rapid ventricular response ([place]) 04/15/2020  . History of CVA (cerebrovascular accident) 04/15/2020  . Normocytic anemia 04/15/2020  . 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 Hypotension 04/15/2020  . Chronic pulmonary aspiration 04/15/2020  . Seizure disorder ([place]) 04/15/2020  . Acute on chronic respiratory failure with hypoxia ([place]) 04/15/2020  . Pneumonia 04/15/2020    Palliative Care Assessment & Plan   Patient Profile:  Pt is a 80 y.o. yo female  with PMH of DM, HFpEF, CVA with right-sided hemiparesis/contracture, s/p right BKA, PEG tube, chronic respiratory failure status post tracheostomy, she was recently at [facility] where treated for HCAP developed A. fib with RVR, hypotension and was sent to ER.  Assessment:  PMT consulted for goals of care discussions, discussions regarding her renal condition and issues related to dialysis.   Recommendations/Plan:  Discussed clinical course this admission and continued concern for multiple co morbidities including, but not limited to, respiratory failure, renal failure, ileus, and new GI bleed.  I was very clear with [name] that I believe that Ms. Pasquarello's body is shutting down and I believe that she is likely to die regardless of interventions moving forward.  We discussed recommedation for comfort and recommendation to avoid aggressive interventions in the event of continued decline or cardiac arrest.    [name] listened to concerns and reports that she will discuss further with family, however, she disagrees with my assessment of her mother's situation and continues to request any and all aggressive interventions.  Palliative to continue to follow.    Code Status:    Code Status Orders  (From admission, onward)         Start     Ordered   04/15/20 2347  Full code  Continuous        04/15/20 2350  Code Status History    This patient has a current  code status but no historical code status.   Advance Care Planning Activity       Prognosis:   guarded   Discharge Planning:  To Be Determined  Care plan was discussed with  Patient and IDT.   Thank you for allowing the Palliative Medicine Team to assist in the care of this patient.   Time In: 1340 Time Out: 1430 Total Time 50 Prolonged Time Billed No    Greater than 50%  of this time was spent counseling and coordinating care related to the above assessment and plan.  [name], MD  Please contact Palliative Medicine Team phone at [phone] for questions and concerns.

## 2020-05-10 DIAGNOSIS — K922 Gastrointestinal hemorrhage, unspecified: Secondary | ICD-10-CM | POA: Diagnosis not present

## 2020-05-10 DIAGNOSIS — J810 Acute pulmonary edema: Secondary | ICD-10-CM | POA: Diagnosis not present

## 2020-05-10 DIAGNOSIS — R0603 Acute respiratory distress: Secondary | ICD-10-CM | POA: Diagnosis not present

## 2020-05-10 DIAGNOSIS — I4891 Unspecified atrial fibrillation: Secondary | ICD-10-CM | POA: Diagnosis not present

## 2020-05-10 DIAGNOSIS — D62 Acute posthemorrhagic anemia: Secondary | ICD-10-CM | POA: Diagnosis not present

## 2020-05-10 DIAGNOSIS — N179 Acute kidney failure, unspecified: Secondary | ICD-10-CM | POA: Diagnosis not present

## 2020-05-10 DIAGNOSIS — Z7189 Other specified counseling: Secondary | ICD-10-CM | POA: Diagnosis not present

## 2020-05-10 DIAGNOSIS — J9621 Acute and chronic respiratory failure with hypoxia: Secondary | ICD-10-CM | POA: Diagnosis not present

## 2020-05-10 LAB — CBC WITH DIFFERENTIAL/PLATELET
Abs Immature Granulocytes: 0.07 10*3/uL (ref 0.00–0.07)
Basophils Absolute: 0 10*3/uL (ref 0.0–0.1)
Basophils Relative: 0 %
Eosinophils Absolute: 0.7 10*3/uL — ABNORMAL HIGH (ref 0.0–0.5)
Eosinophils Relative: 6 %
HCT: 22.9 % — ABNORMAL LOW (ref 36.0–46.0)
Hemoglobin: 7.4 g/dL — ABNORMAL LOW (ref 12.0–15.0)
Immature Granulocytes: 1 %
Lymphocytes Relative: 11 %
Lymphs Abs: 1.3 10*3/uL (ref 0.7–4.0)
MCH: 30.5 pg (ref 26.0–34.0)
MCHC: 32.3 g/dL (ref 30.0–36.0)
MCV: 94.2 fL (ref 80.0–100.0)
Monocytes Absolute: 1 10*3/uL (ref 0.1–1.0)
Monocytes Relative: 8 %
Neutro Abs: 9.1 10*3/uL — ABNORMAL HIGH (ref 1.7–7.7)
Neutrophils Relative %: 74 %
Platelets: 158 10*3/uL (ref 150–400)
RBC: 2.43 MIL/uL — ABNORMAL LOW (ref 3.87–5.11)
RDW: 18.8 % — ABNORMAL HIGH (ref 11.5–15.5)
WBC: 12.2 10*3/uL — ABNORMAL HIGH (ref 4.0–10.5)
nRBC: 0.5 % — ABNORMAL HIGH (ref 0.0–0.2)

## 2020-05-10 LAB — BPAM RBC
Blood Product Expiration Date: 202204292359
Blood Product Expiration Date: 202205052359
ISSUE DATE / TIME: 202204051559
ISSUE DATE / TIME: 202204060816
Unit Type and Rh: 5100
Unit Type and Rh: 5100

## 2020-05-10 LAB — RENAL FUNCTION PANEL
Albumin: 2.6 g/dL — ABNORMAL LOW (ref 3.5–5.0)
Anion gap: 12 (ref 5–15)
BUN: 118 mg/dL — ABNORMAL HIGH (ref 8–23)
CO2: 24 mmol/L (ref 22–32)
Calcium: 8.7 mg/dL — ABNORMAL LOW (ref 8.9–10.3)
Chloride: 102 mmol/L (ref 98–111)
Creatinine, Ser: 3.09 mg/dL — ABNORMAL HIGH (ref 0.44–1.00)
GFR, Estimated: 15 mL/min — ABNORMAL LOW (ref >60)
Glucose, Bld: 150 mg/dL — ABNORMAL HIGH (ref 70–99)
Phosphorus: 5.3 mg/dL — ABNORMAL HIGH (ref 2.5–4.6)
Potassium: 3.6 mmol/L (ref 3.5–5.1)
Sodium: 138 mmol/L (ref 135–145)

## 2020-05-10 LAB — GLUCOSE, CAPILLARY
Glucose-Capillary: 150 mg/dL — ABNORMAL HIGH (ref 70–99)
Glucose-Capillary: 134 mg/dL — ABNORMAL HIGH (ref 70–99)
Glucose-Capillary: 157 mg/dL — ABNORMAL HIGH (ref 70–99)
Glucose-Capillary: 157 mg/dL — ABNORMAL HIGH (ref 70–99)
Glucose-Capillary: 137 mg/dL — ABNORMAL HIGH (ref 70–99)
Glucose-Capillary: 165 mg/dL — ABNORMAL HIGH (ref 70–99)

## 2020-05-10 LAB — TYPE AND SCREEN
ABO/RH(D): O POS
Antibody Screen: NEGATIVE
Unit division: 0
Unit division: 0

## 2020-05-10 LAB — MAGNESIUM: Magnesium: 2 mg/dL (ref 1.7–2.4)

## 2020-05-10 MED ORDER — VITAL AF 1.2 CAL PO LIQD
1000.0000 mL | ORAL | Status: DC
  Administered 2020-05-10 – 2020-05-14 (×6): 1000 mL
  Filled 2020-05-10 (×5): qty 1000

## 2020-05-10 NOTE — Progress Notes (Signed)
Nutrition Follow-up  RD working remotely.  DOCUMENTATION CODES:   Not applicable  INTERVENTION:   Tube feeds via G-tube: - Per discussion with MD, advance Vital AF 1.2 by 10 ml q 8 hours to goal rate of 60 ml/hr (1440 ml/day)  Tube feeding regimen at goal provides1728kcal, 108grams of protein, and 1129m of H2O.  Tube feeding regimen at goal provides 2369 mg of potassium and 1446 mg of phosphorus.  Do NOT recommend Nepro formula due to high fat content which may cause intolerance in pt with ileus.  NUTRITION DIAGNOSIS:   Inadequate oral intake related to inability to eat as evidenced by NPO status.  Ongoing, being addressed via TF  GOAL:   Patient will meet greater than or equal to 90% of their needs  Progressing with advancement of TF to goal  MONITOR:   Vent status,Labs,Weight trends,TF tolerance,Skin,I & O's  REASON FOR ASSESSMENT:   Consult Assessment of nutrition requirement/status  ASSESSMENT:   80year old female with history of chronic hypoxic respiratory failure with tracheostomy and PEG tube dependence, aspiration, hypertension, diabetes mellitus type 2, chronic diastolic CHF, seizure disorder, unspecified CVA with right-sided hemiparesis, chronic vegetative state, paroxysmal A. fib recently taken off anticoagulation, [facility] presents with A. fib with RVR and HCAP  3/18 - placed on vent support, transferred to ICU 4/03 - pt vomited, TF held, x-ray showing ileus, NG tube placed to suction, temp HD cath placed 4/04 - first iHD treatment  Discussed pt with RN and MD. Per RN, pt tolerating tube feeds at 20 ml/hr. Per MD, will increase by 10 ml q 8 hours to goal rate of 60 ml/hr. Orders in place.  NG tube that was placed for ileus was removed this AM.  GOC discussions ongoing. Palliative Care is involved. Per notes, pt with suspected upper GI bleed. No endoscopy planned at this time as  procedure is high-risk for pt.  Last HD was yesterday with 1000 ml net UF.  Admit weight: 78.6 kg Current weight:87.7 kg  Pt with non-pitting edema to BUE and +2 pitting edema to BLE.  Patient remains on ventilator support via trach MV: 8.2 L/min Temp (24hrs), Avg:98.5 F (36.9 C), Min:97.7 F (36.5 C), Max:99 F (37.2 C)  Medications reviewed and include: aranesp, SSI q 4 hours, IV protonix, amiodarone  Labs reviewed: phosphorus 5.3, hemoglobin 7.4, BUN 118, creatinine 3.09 CBG's: 134-154 x 24 hours  UOP: 300 ml x 24 hours I/O's: +14.1 L since admit  Diet Order:   Diet Order            Diet NPO time specified  Diet effective now                 EDUCATION NEEDS:   Not appropriate for education at this time  Skin:  Skin Assessment: Skin Integrity Issues: Stage II: coccyx Other: necrotic left third toe, right AKA, skin tear to back  Last BM:  05/10/20 rectal tube  Height:   Ht Readings from Last 1 Encounters:  04/25/20 '5\' 7"'$  (1.702 m)    Weight:   Wt Readings from Last 1 Encounters:  05/10/20 87.7 kg    BMI:  Body mass index is 30.28 kg/m.  Estimated Nutritional Needs:   Kcal:  1700-1900  Protein:  100-115g/day  Fluid:  1.5 L/day    KGustavus Bryant MS, RD, LDN Inpatient Clinical Dietitian Please see AMiON for contact information.

## 2020-05-10 NOTE — Progress Notes (Signed)
[facility] NEPHROLOGY PROGRESS NOTE  Assessment/ Plan: Pt is a 80 y.o. yo female  with PMH of DM, HFpEF, CVA with right-sided hemiparesis/contracture, s/p right BKA, PEG tube, chronic respiratory failure status post tracheostomy, she was recently at [facility] where treated for HCAP developed A. fib with RVR, hypotension and was sent to ER.  Acute kidney injury: Cr 1 as of 04/15/20 now with severe AKI. Suspect hemodynamically mediated AKI in the setting of A. fib with RVR, IV contrast, recent infection.  UA without proteinuria or microscopic hematuria.  Kidney ultrasound ruled out obstruction.   -Her UOP was improving through the week with stable Cr but in the setting of worsening azotemia was given a trial of albumin volume expansion without improvement.  -Have had numerous discussions with Lolita Patella re: dialysis.  Recommend against it in light of comorbids and not a candidate for long term.  She desires a short term trial to see if a bit more time will improve renal function.  s/p temp cath 05/06/20 via PCCM and we will do a 1 week trial of HD.  If unable to liberate from HD would need to move to palliative measures.  Dr Johnney Ou discussed with dtr 05/06/20.  Of note palliative care and ethics involved in discussion too - Continue max supportive care. -daily labs, monitor strict I/O please - tolerated HD 05/07/20, HD 05/09/20.  Next HD 05/11/20 which will complete our week of HD trial.  She is not recovering.  It appears that her body is shutting down.  I do not think dialysis is going to fix the overall MSOD and trajectory that she's on.  I appreciate palliative care's efforts.    Hypotension, h/o HTN - on midodrine with normal BP  Acute on chronic hypoxic respiratory failure, h/o trach: Currently on vent in ICU.  PCCM is following.  Severe sepsis/Healthcare associated pneumonia/septic shock: s/p broad-spectrum antibiotics per primary team.  Off pressors, on midodrine  A. fib with  RVR: On dilt, eliquis being held   Malnutrition:  Supplement per RD.  GI bleed: severity of illness precludes scopes per GI  Subjective:  Palliative care met with pt's dtr yesterday- still full scope of care  Objective Vital signs in last 24 hours: Vitals:   05/10/20 0930 05/10/20 1000 05/10/20 1030 05/10/20 1100  BP: 102/73 104/64 100/85 (!) 104/56  Pulse: 95 94 99 (!) 104  Resp: 20 20 (!) 24 20  Temp:      TempSrc:      SpO2: 98% 98% 98% 97%  Weight:      Height:       Weight change: 0 kg  Intake/Output Summary (Last 24 hours) at 05/10/2020 1137 Last data filed at 05/10/2020 0600 Gross per 24 hour  Intake 630.66 ml  Output 1650 ml  Net -1019.34 ml       Labs: Basic Metabolic Panel: Recent Labs  Lab 05/09/20 0344 05/09/20 1524 05/10/20 0307  NA 140 140 138  K 4.3 4.4 3.6  CL 102 103 102  CO2 '22 23 24  ' GLUCOSE 152* 152* 150*  BUN 162* 173* 118*  CREATININE 3.95* 4.02* 3.09*  CALCIUM 9.1 9.1 8.7*  PHOS 7.3* 7.3* 5.3*   Liver Function Tests: Recent Labs  Lab 05/09/20 0344 05/09/20 1524 05/10/20 0307  ALBUMIN 2.6* 2.7* 2.6*   No results for input(s): LIPASE, AMYLASE in the last 168 hours. No results for input(s): AMMONIA in the last 168 hours. CBC: Recent Labs  Lab 05/06/20 0520  05/07/20 0444 05/08/20 0406 05/08/20 1301 05/09/20 0344 05/09/20 0612 05/09/20 1238 05/10/20 0307  WBC 7.9 11.0* 10.6*  --  9.9  --   --  12.2*  NEUTROABS 6.2 8.9* 9.2*  --  7.3  --   --  9.1*  HGB 8.3* 8.9* 7.0*   < > 7.0* 6.9* 7.9* 7.4*  HCT 26.4* 28.3* 22.3*   < > 21.2* 21.3* 24.8* 22.9*  MCV 94.6 93.7 96.1  --  93.0  --   --  94.2  PLT 217 270 229  --  171  --   --  158   < > = values in this interval not displayed.   Cardiac Enzymes: No results for input(s): CKTOTAL, CKMB, CKMBINDEX, TROPONINI in the last 168 hours. CBG: Recent Labs  Lab 05/09/20 1942 05/09/20 2332 05/10/20 0307 05/10/20 0712 05/10/20 1117  GLUCAP 148* 154* 150* 134* 157*    Iron  Studies:  No results for input(s): IRON, TIBC, TRANSFERRIN, FERRITIN in the last 72 hours. Studies/Results: DG Abd Portable 1V  Result Date: 05/09/2020 CLINICAL DATA:  Adynamic ileus. EXAM: PORTABLE ABDOMEN - 1 VIEW COMPARISON:  05/08/2020.  05/06/2020. FINDINGS: NG tube noted with its tip in stable position in the stomach. No bowel distention. No free air. Mild residual contrast in the right colon. Aortoiliac and visceral atherosclerotic vascular calcification. Destructive changes again noted about the right hip. IMPRESSION: 1. NG tube noted with tip over the stomach in stable position. No bowel distention. 2.  Aortoiliac and visceral atherosclerotic vascular disease. 3.  Destructive changes again noted about the right hip. Electronically Signed   By: Marcello Moores  Register   On: 05/09/2020 05:06    Medications: Infusions: . sodium chloride Stopped (05/05/20 1003)  . sodium chloride    . sodium chloride    . amiodarone 30 mg/hr (05/10/20 0605)  . feeding supplement (VITAL AF 1.2 CAL) 1,000 mL (05/10/20 1119)  . levETIRAcetam Stopped (05/09/20 2210)    Scheduled Medications: . amiodarone  200 mg Per Tube Daily  . chlorhexidine gluconate (MEDLINE KIT)  15 mL Mouth Rinse BID  . Chlorhexidine Gluconate Cloth  6 each Topical Daily  . darbepoetin (ARANESP) injection - DIALYSIS  100 mcg Intravenous Q Wed-HD  . guaiFENesin  5 mL Per Tube Q12H  . insulin aspart  0-15 Units Subcutaneous Q4H  . mouth rinse  15 mL Mouth Rinse 10 times per day  . midodrine  5 mg Per Tube Q8H  . pantoprazole (PROTONIX) IV  40 mg Intravenous Q12H  . QUEtiapine  25 mg Per Tube QHS  . sodium chloride flush  10-40 mL Intracatheter Q12H    have reviewed scheduled and prn medications.  Physical Exam: General: Chronically ill looking female, on mechanical ventilation HEENT: NGT   Neck: +trach Heart:RRR, s1s2 nl Lungs: coarse BL, on vent via trach Abdomen:soft,  non-distended Extremities: 1+ anasarca, Contracture of  upper extremities, right AKA. Neurology: pretty lethargic   Orion Vandervort 05/10/2020,11:37 AM  LOS: 25 days

## 2020-05-10 NOTE — Progress Notes (Incomplete)
Tf to be increased 14m/hr to goal of 621m    May tube all meds.   Give PO amio, then turn off IV Amio 1 hour after  Take NG tube out

## 2020-05-10 NOTE — Progress Notes (Addendum)
[facility] Progress Note  CC:  Ileus, hematochezia   Subjective: Patient is unresponsive. No family at the bedside.    Objective:  Vital signs in last 24 hours: Temp:  [97.7 F (36.5 C)-99 F (37.2 C)] 98.9 F (37.2 C) (04/07 0700) Pulse Rate:  [86-150] 96 (04/07 0800) Resp:  [20-29] 20 (04/07 0800) BP: (98-145)/(46-112) 101/56 (04/07 0800) SpO2:  [96 %-100 %] 100 % (04/07 0811) FiO2 (%):  [30 %] 30 % (04/07 0811) Weight:  [87.7 kg-89.8 kg] 87.7 kg (04/07 0323) Last BM Date: 05/10/20 General: Eyes open spontaneously otherwise unresponsive.  Heart: RRR, no murmur.  Pulm:  Patient has a trache on vent. Breath sounds clear, decreased in the bases.  Abdomen: Anasarca present. No gross distension. PEG tube below xyphoid with tube feedings infusing. Hypoactive BS x 4 quads. Flexi seal with 475cc dark red/maroon bloody brown liquid.  Extremities:  Anasarca. LLE 1+ edema. RUE contracture. Right AKA.  Neurologic:  Unresponsive.   Intake/Output from previous day: 04/06 0701 - 04/07 0700 In: 980.7 [I.V.:250.8; Blood:350; NG/GT:280; IV Piggyback:99.8] Out: 1650 [Urine:300; Stool:350]  Stool/Flexi seal  output 475cc past 24 hours Intake/Output this shift: No intake/output data recorded.  Lab Results: Recent Labs    05/08/20 0406 05/08/20 1301 05/09/20 0344 05/09/20 0612 05/09/20 1238 05/10/20 0307  WBC 10.6*  --  9.9  --   --  12.2*  HGB 7.0*   < > 7.0* 6.9* 7.9* 7.4*  HCT 22.3*   < > 21.2* 21.3* 24.8* 22.9*  PLT 229  --  171  --   --  158   < > = values in this interval not displayed.   BMET Recent Labs    05/09/20 0344 05/09/20 1524 05/10/20 0307  NA 140 140 138  K 4.3 4.4 3.6  CL 102 103 102  CO2 _0 GLUCOSE 152* 152* 150*  BUN 162* 173* 118*  CREATININE 3.95* 4.02* 3.09*  CALCIUM 9.1 9.1 8.7*   LFT Recent Labs    05/10/20 0307  ALBUMIN 2.6*   PT/INR No results for input(s): LABPROT, INR in the last 72 hours. Hepatitis  Panel Recent Labs    05/07/20 1439  HEPBSAG NON REACTIVE    DG Abd Portable 1V  Result Date: 05/09/2020 CLINICAL DATA:  Adynamic ileus. EXAM: PORTABLE ABDOMEN - 1 VIEW COMPARISON:  05/08/2020.  05/06/2020. FINDINGS: NG tube noted with its tip in stable position in the stomach. No bowel distention. No free air. Mild residual contrast in the right colon. Aortoiliac and visceral atherosclerotic vascular calcification. Destructive changes again noted about the right hip. IMPRESSION: 1. NG tube noted with tip over the stomach in stable position. No bowel distention. 2.  Aortoiliac and visceral atherosclerotic vascular disease. 3.  Destructive changes again noted about the right hip. Electronically Signed   By: Marcello Moores  Register   On: 05/09/2020 05:06    Assessment / Plan:  [age] yearwith a complex medical history including CVA with right sided hemiparesis, chronic respiratory failure s/p trache and PEG placement 02/2020 admitted to the hospital 04/15/2020 with atrial fibrillation with RVR with subsequent development ofacute on chronic anemiawith acute UGI bleeding with red blood with clots from her NG tube and maroon blood from the flexi seal bag on 4/5. Hg7.0 ->6.1. Transfused one unit of PRBCs -> Hg 7.4 -> Hg 6.9. Transfused 1 unit of PRBCs 4/7. Today Hg 7.4. Less maroon bloody stool today, 475 cc output from flexi seal past 24 hours.  -  Continue PPI IV bid -Continue tube feedings as tolerated    -Continue to hold Eliquis  -No plans for endoscopic evaluation at this time as patient is at high risk forprocedurecomplications secondary to numerous comorbidities. Transfuse to maintain Hg>7. Further recommendation per Dr. Fuller Plan. -Palliative care met with daughter POA on 4/6, daughter requests full care. Patient remains full code.  -H/H in am  2. Respiratory failures/p trache on theventilator -Management per the medical team  3.Paroxysmal atrial fibrillation with RVR. On Amiodarone.  Eliquis on hold since 4/1.  4. Chronic anemia, multifactorial with GI bleed as noted in # 1  5. Adynamic ileus per abdominal xray without bowel distension 4/5. K+ 4.1.  -Turn patient Q 2 hours if tolerated   6. AcuteRenal failure. RIJ dialysis catheter. S/P first HD on 4/4, 2nd HD session scheduled today.    Principal Problem:   Atrial fibrillation with rapid ventricular response (HCC) Active Problems:   History of CVA (cerebrovascular accident)   Normocytic anemia   Tracheostomy dependence (HCC)   Hypotension   Chronic pulmonary aspiration   Seizure disorder (HCC)   Acute on chronic respiratory failure with hypoxia (HCC)   Pneumonia   Hypertension associated with diabetes ([place])   Acute respiratory distress   AKI (acute kidney injury) ([place])   VRE (vancomycin resistant enterococcus) culture positive   Pressure injury of skin   Chronic respiratory failure ([place])   Palliative care by specialist   Goals of care, counseling/discussion    LOS: 25 days   Noralyn Pick  05/10/2020, 10:53 AM     Attending Physician Note   I have taken an interval history, reviewed the chart and examined the patient. I agree with the Advanced Practitioner's note, impression and recommendations.   Acute UGI bleed persists however it appears to be slowing EGD felt to be very high risk in this patient with multiple comorbidities  Continue to hold Eliquis  Continue PPI bid Continue TFs as tolerated Trend CBC Palliative care consult is pending   Lucio Edward, MD [facility] [phone]

## 2020-05-10 NOTE — Progress Notes (Signed)
Palliative Care Progress Note  Reason for consult: Goals of care  I saw and examined Rebecca Harrell today.  She is awake but not really interactive.  She does not track or follow commands.  I attempted to call her daughter, Rebecca Harrell, this evening.  I was not able to reach her.  Will plan to reach out again tomorrow.  I discussed her case with Dr. Hollie Salk and Dr. Tana Coast.  Rebecca Harrell has multisystem organ involvement that would not be fixed even if she were a long term dialysis candidate.  At the same time, renal failure is one of the critical events driving the fact that her body is shutting down.  Agreement was made last week for one week trial of HD to see if more time would result in recovery of her renal function.   Concerns at this point are similar to those previously outlined and discussed prior to trial of dialysis. 1) She has multisystem failure, including renal failure.  Her renal function in not improving despite medical interventions. 2) She is not a candidate for long term dialysis and she has not had recovery of renal function with short term dialysis trial.  Last session for this is tomorrow. 3) No plan to continue dialysis after tomorrow's session.  If her renal function does not improve following last dialysis session tomorrow, the expected outcome of renal failure in someone who is not a candidate for long term dialysis is death 4) Offering resuscitation at the time of an expected death with a non-reversible cause is non-beneficial and therefore not medically appropriate  This is a conversation that I had previously with family, care team, and ethics team.  If we are not seeing recovery of renal function following last dialysis session tomorrow, will revisit with family and would recommend change in code status to DNR as offering resuscitation in this situation would only result in harm with no realistic opportunity for any benefit.    Total time: 30 minutes  Greater than 50%  of this time  was spent counseling and coordinating care related to the above assessment and plan.  [name], MD [place] [facility] [phone]

## 2020-05-10 NOTE — Progress Notes (Signed)
[facility]                                                                              Patient Demographics  Rebecca Harrell, is a 80 y.o. female, DOB - 1940-02-21, HTD:[id number]  Admit date - 04/15/2020   Admitting Physician Vianne Bulls, MD  Outpatient Primary MD for the patient is Shackleford, Vicie Mutters, MD  Outpatient specialists:   LOS - 25  days   Medical records reviewed and are as summarized below:    Chief Complaint  Patient presents with  . Tachycardia       Brief summary   Patient is a 80 year old female with hypertension, type 2 diabetes mellitus, diastolic heart failure, seizures, CVA with right-sided hemiparesis, paroxysmal atrial fibrillation, chronic hypoxic respiratory failure status post tracheostomy, dysphagia status post PEG tube.  Patient is a [place] resident, transferred to acute care due to persistent tachycardia, refractory to intravenous metoprolol.  At the time of transfer, O2 sats mid 90s on 8 L supplemental O2, respiratory rate 30, HR 130, BP 86/45.  Patient was noted to have coarse breath sounds bilaterally, scattered rhonchi, irregularly irregular tachycardia.  Patient was diagnosed with A. fib with RVR, complicated with acute on chronic hypoxic respiratory failure, aspiration pneumonitis.  Patient was admitted to ICU, placed on broad-spectrum antibiotic therapy.  Bronch positive for Pseudomonas.  Patient was placed on anticoagulation with heparin drip, required 1 unit packed RBC transfusion.  Developed intermittent hypotension requiring vasopressors, transitioned to midodrine.  Tracheostomy change 3/22. Patient was transferred to [facility] service on 3/23. On 4/1, discussed with nephrology, Dr. Johnney Ou, ICU team, Dr. Elsworth Soho and ethics committee representative, Dr. Verlon Au.  Patient had nonoliguric severe AKI likely secondary to ATN.  Long-term prognosis guarded. On 4/2, patient was noted to be volume overloaded, trial for temporary  hemodialysis  On 4/3, patient had temporary HD cath placement by CCM, developed ileus, tube feeds placed on hold, NGT placed 4/5: Developed GI bleeding with bloody BMs 4/6: Goals of care discussion with patient's family  Assessment & Plan    Acute on chronic respiratory failure with hypoxia -Trach management per pulmonology -Plan to return to [facility] where she can continue with invasive mechanical ventilation.  AKI with ATN, nonoliguric -Likely worsening due to atrial fibrillation with RVR, IV contrast, recent infection, ruled out obstruction. -Nephrology following, recommended against long-term hemodialysis.  However family requested short-term trial to see if improves renal function. -Temporary HD cath placed on 4/3, nephrology planning 1 week trial of HD.  -Now on hemodialysis, next HD on 4/8   Acute GI bleed, ABLA on anemia of chronic disease -Hemoglobin again down to 6.9 this morning, transfused 1 unit packed RBCs.  Received 1 unit on 05/08/2020 -Xarelto has been held since 4/2 for HD cath placement and has not been resumed. -Continue to hold Xarelto, reviewed GI recommendations, endoscopic procedure would be a very high risk due to her current condition, recommended comfort care. -Hemoglobin 7.4, continue to monitor, transfuse as needed  Ileus, acute -Improved, tolerated trickle tube feeds remove NGT, -Will increase tube feeds 10 cc every 8 hours slowly to the goal rate of  60 cc an hour -Resume meds via tube   Paroxysmal atrial fibrillation with RVR -Oral amiodarone currently on hold due to ileus -Continue to hold Xarelto, not a candidate for anticoagulation given GI bleed -Overnight in RVR, was placed on IV amiodarone -will transition to oral amiodarone via tube, discontinue IV amiodarone  Hypotension with history of hypertension -BP currently stable  Diabetes mellitus type 2, IDDM -Continue sliding scale insulin  History of CVA, right-sided hemiparesis,  dysphagia Continue tracheostomy, PEG tube.  -Continue IV Keppra   Pressure injury, -Stage II coccyx, POA  Severe sepsis/HCAP, septic shock -Status post broad-spectrum antibiotics per CCM, off pressors  Goals of care Please see my note from 4/6 Palliative care consult was obtained. Dr. Domingo Cocking met with patient's daughter, [name] on 4/6 at bedside, explained all the above, poor prognosis, progressing to multiorgan failure.  Patient's daughter however requested to continue current management and full scope of care.  Does not appear that she is realistic of patient's current tenuous status despite multiple discussions with her (HPOA) and other family members.    Obesity Estimated body mass index is 30.28 kg/m as calculated from the following:   Height as of this encounter: 5' 7" (1.702 m).   Weight as of this encounter: 87.7 kg.  Code Status: Full code DVT Prophylaxis:  Eliquis currently on hold due to ileus   Level of Care: Level of care: ICU Family Communication: Discussed with patient's son [name] face-to-face, discussed with patient's son [name] and daughter on the phone yesterday.   Disposition Plan:     Status is: Inpatient  Remains inpatient appropriate because:Inpatient level of care appropriate due to severity of illness   Dispo: The patient is from: [facility]              Anticipated d/c is to: LTAC              Patient currently is not medically stable to d/c.   Difficult to place patient No      Time Spent in minutes 45 minutes  Procedures:    Consultants:   [facility]  Antimicrobials:   Anti-infectives (From admission, onward)   Start     Dose/Rate Route Frequency Ordered Stop   04/19/20 1115  ceFEPIme (MAXIPIME) 2 g in sodium chloride 0.9 % 100 mL IVPB        2 g 200 mL/hr over 30 Minutes Intravenous Every 24 hours 04/18/20 1503 04/20/20 1051   04/19/20 1102  ceFEPIme (MAXIPIME) 2 g in sodium chloride 0.9 % 100  mL IVPB  Status:  Discontinued        2 g 200 mL/hr over 30 Minutes Intravenous Every 24 hours 04/18/20 1501 04/18/20 1503   04/17/20 1100  linezolid (ZYVOX) IVPB 600 mg        600 mg 300 mL/hr over 60 Minutes Intravenous Every 12 hours 04/17/20 1002 04/20/20 0032   04/16/20 1400  vancomycin (VANCOREADY) IVPB 1250 mg/250 mL  Status:  Discontinued        1,250 mg 166.7 mL/hr over 90 Minutes Intravenous Every 24 hours 04/15/20 1436 04/17/20 0813   04/16/20 0600  ceFEPIme (MAXIPIME) 2 g in sodium chloride 0.9 % 100 mL IVPB  Status:  Discontinued        2 g 200 mL/hr over 30 Minutes Intravenous Every 12 hours 04/15/20 2350 04/18/20 1501   04/16/20 0000  azithromycin (ZITHROMAX) 500 mg in sodium chloride 0.9 % 250 mL IVPB  500 mg 250 mL/hr over 60 Minutes Intravenous Every 24 hours 04/15/20 2350 04/20/20 0100   04/15/20 1700  piperacillin-tazobactam (ZOSYN) IVPB 3.375 g        3.375 g 12.5 mL/hr over 240 Minutes Intravenous  Once 04/15/20 1646 04/15/20 2101   04/15/20 1315  vancomycin (VANCOREADY) IVPB 1500 mg/300 mL        1,500 mg 150 mL/hr over 120 Minutes Intravenous  Once 04/15/20 1305 04/15/20 1617         Medications  Scheduled Meds: . amiodarone  200 mg Per Tube Daily  . chlorhexidine gluconate (MEDLINE KIT)  15 mL Mouth Rinse BID  . Chlorhexidine Gluconate Cloth  6 each Topical Daily  . darbepoetin (ARANESP) injection - DIALYSIS  100 mcg Intravenous Q Wed-HD  . guaiFENesin  5 mL Per Tube Q12H  . insulin aspart  0-15 Units Subcutaneous Q4H  . mouth rinse  15 mL Mouth Rinse 10 times per day  . midodrine  5 mg Per Tube Q8H  . pantoprazole (PROTONIX) IV  40 mg Intravenous Q12H  . QUEtiapine  25 mg Per Tube QHS  . sodium chloride flush  10-40 mL Intracatheter Q12H   Continuous Infusions: . sodium chloride Stopped (05/05/20 1003)  . sodium chloride    . sodium chloride    . amiodarone Stopped (05/10/20 1242)  . feeding supplement (VITAL AF 1.2 CAL) 1,000 mL  (05/10/20 1119)  . levETIRAcetam Stopped (05/09/20 2210)   PRN Meds:.sodium chloride, sodium chloride, acetaminophen, alteplase, dextrose, fentaNYL (SUBLIMAZE) injection, heparin, ipratropium-albuterol, lidocaine (PF), lidocaine-prilocaine, metoprolol tartrate, ondansetron (ZOFRAN) IV, oxyCODONE, pentafluoroprop-tetrafluoroeth, sodium chloride flush      Subjective:   Lawonda Pretlow was seen and examined today.  Ill-appearing, lethargic, not following any commands.  No change in status. . Objective:   Vitals:   05/10/20 1200 05/10/20 1238 05/10/20 1300 05/10/20 1400  BP: 110/63  (!) 102/59 122/74  Pulse: 99  (!) 102 (!) 105  Resp: (!) 21  20 (!) 22  Temp:      TempSrc:      SpO2: 96% 97% 97% 97%  Weight:      Height:        Intake/Output Summary (Last 24 hours) at 05/10/2020 1619 Last data filed at 05/10/2020 1400 Gross per 24 hour  Intake 929.31 ml  Output 1650 ml  Net -720.69 ml     Wt Readings from Last 3 Encounters:  05/10/20 87.7 kg   Physical Exam  General: Awake, ill-appearing, on vent, does not follow commands, trach +  Cardiovascular: S1 S2 clear, RRR. No pedal edema b/l  Respiratory: Coarse BS  Gastrointestinal: Soft, PEG tube  Ext: right AKA, contractures upper extremities, anasarca  Neuro: does not respond to any verbal commands  Musculoskeletal: No cyanosis, clubbing  Skin: No rashes  Psych: awake, not responding to any verbal commands      Data Reviewed:  I have personally reviewed following labs and imaging studies  Micro Results No results found for this or any previous visit (from the past 240 hour(s)).  Radiology Reports CT ABDOMEN WO CONTRAST  Result Date: 04/15/2020 CLINICAL DATA:  Possible G-tube malpositioning EXAM: CT ABDOMEN WITHOUT CONTRAST TECHNIQUE: Multidetector CT imaging of the abdomen was performed following the standard protocol without IV contrast. COMPARISON:  CT chest same day FINDINGS: Lower chest: Cardiomegaly.  Coronary artery calcifications. Small bilateral pleural effusions with patchy bibasilar opacities. Hepatobiliary: Unremarkable unenhanced appearance of the liver. No focal liver lesion identified. Gallbladder is decompressed. No hyperdense  gallstone. No biliary dilatation. Pancreas: Unremarkable. No pancreatic ductal dilatation or surrounding inflammatory changes. Spleen: Grossly unremarkable. Adrenals/Urinary Tract: No definite adrenal nodule. Kidneys appear within normal limits. Excreted contrast within the bilateral renal collecting systems. No hydronephrosis. Stomach/Bowel: Percutaneous gastrostomy tube is appropriately positioned with balloon located in the anterior aspect of the distal gastric body. No evidence of outlet obstruction. Visualized bowel loops within the upper abdomen are within normal limits. No evidence of obstruction or inflammation. Vascular/Lymphatic: Extensive atherosclerosis throughout the aortoiliac axis. No upper abdominal lymphadenopathy. Other: No ascites or pneumoperitoneum. Musculoskeletal: No acute findings. IMPRESSION: 1. Percutaneous gastrostomy tube is appropriately positioned with balloon located intraluminally in the anterior aspect of the distal gastric body. No evidence of outlet obstruction. 2. No acute findings within the abdomen. 3. Small bilateral pleural effusions with patchy bibasilar opacities. Aortic Atherosclerosis (ICD10-I70.0). Electronically Signed   By: Davina Poke D.O.   On: 04/15/2020 16:16   DG Chest 2 View  Result Date: 04/19/2020 CLINICAL DATA:  Dyspnea, diabetes mellitus EXAM: CHEST - 2 VIEW COMPARISON:  Portable exam 1504 hours compared to 04/17/2020 FINDINGS: Tracheostomy tube unchanged. Enlargement of cardiac silhouette. Atherosclerotic calcifications aorta. Extensive BILATERAL pulmonary infiltrates, slightly increased. This could represent pulmonary edema or multifocal pneumonia. Small LEFT pleural effusion. No pneumothorax or acute osseous  findings. IMPRESSION: BILATERAL pulmonary infiltrates question pulmonary edema versus multifocal pneumonia. Enlargement of cardiac silhouette with small LEFT pleural effusion. Electronically Signed   By: Lavonia Dana M.D.   On: 04/19/2020 16:40   DG Abd 1 View  Result Date: 05/06/2020 CLINICAL DATA:  NG tube placement. EXAM: ABDOMEN - 1 VIEW COMPARISON:  KUB and chest x-ray earlier today. FINDINGS: Interval placement of nasogastric tube with tip and side-port over the stomach in the left upper quadrant. Visualized bowel gas pattern is nonobstructive. Remainder of the exam is unchanged. IMPRESSION: Nasogastric tube with tip and side-port over the stomach in the left upper quadrant. Electronically Signed   By: Marin Olp M.D.   On: 05/06/2020 16:53   DG Abd 1 View  Result Date: 05/06/2020 CLINICAL DATA:  Ileus. EXAM: ABDOMEN - 1 VIEW COMPARISON:  November 06, 2010 FINDINGS: The lung bases were not imaged. Air-filled large and small bowel suggest ileus. Early or partial small bowel obstruction not completely excluded as there is no gas in the region of the rectum. No free air, portal venous gas, or pneumatosis. The right hip is abnormal and there is either bony erosion or a fracture. IMPRESSION: 1. Air-filled prominent loops of large and small bowel favored to represent ileus given history. Early or partial small bowel obstruction not completely excluded. Recommend close attention on follow-up. 2. Abnormal right hip with either a comminuted fracture or bony erosion, incompletely evaluated. Recommend clinical correlation and dedicated imaging. Electronically Signed   By: Dorise Bullion III M.D   On: 05/06/2020 15:07   CT Angio Chest PE W and/or Wo Contrast  Result Date: 04/15/2020 CLINICAL DATA:  Tachycardia EXAM: CT ANGIOGRAPHY CHEST WITH CONTRAST TECHNIQUE: Multidetector CT imaging of the chest was performed using the standard protocol during bolus administration of intravenous contrast. Multiplanar CT  image reconstructions and MIPs were obtained to evaluate the vascular anatomy. CONTRAST:  68m OMNIPAQUE IOHEXOL 350 MG/ML SOLN COMPARISON:  04/15/2020 FINDINGS: Cardiovascular: Satisfactory opacification of the pulmonary arteries to the segmental level. No evidence of pulmonary embolism. Thoracic aorta is nonaneurysmal. Common origin of the brachiocephalic and left common carotid arteries. Tortuosity of the descending thoracic aorta. Atherosclerotic calcifications of the aorta  and coronary arteries. Heart size is mildly enlarged. No pericardial effusion. Mediastinum/Nodes: Mildly prominent mediastinal lymph nodes including 12 mm precarinal node (series 5, image 37). No enlarged axillary or hilar lymph nodes identified. Tracheostomy tube is present. Trachea and esophagus within normal limits. No significant findings within the thyroid gland. Lungs/Pleura: Small bilateral pleural effusions. Loculated pleural fluid accumulation within the minor fissure on the right and within the oblique fissure on the left. Patchy bibasilar airspace opacities. Diffuse interlobular septal thickening with ground-glass attenuation throughout both lung fields. No pneumothorax. Upper Abdomen: Although only partially visualized at the edge of the field of view. Patient's percutaneous gastrostomy tube appears malpositioned with balloon appearing extraluminal positioned anterior to the left hepatic lobe (series 5, image 97). Musculoskeletal: No acute osseous abnormality.  No chest wall mass. Review of the MIP images confirms the above findings. IMPRESSION: 1. Negative for acute pulmonary embolism. 2. Findings suggestive of pulmonary edema with small bilateral pleural effusions. Loculated pleural fluid accumulation within the minor fissure on the right and within the oblique fissure on the left. 3. Patchy bibasilar airspace opacities may represent edema, atelectasis, or pneumonia. 4. Patient's percutaneous gastrostomy tube appears  malpositioned although is incompletely visualized at the edge of the field of view. CT of the abdomen could be obtained to further evaluate. 5. Mildly prominent mediastinal lymph nodes, likely reactive. 6. Aortic and coronary artery atherosclerosis (ICD10-I70.0). Electronically Signed   By: Davina Poke D.O.   On: 04/15/2020 15:00   US RENAL  Result Date: 04/18/2020 CLINICAL DATA:  Acute renal injury. EXAM: RENAL / URINARY TRACT ULTRASOUND COMPLETE COMPARISON:  CT abdomen pelvis 04/15/2020 FINDINGS: Right Kidney: Renal measurements: 10.5 x 3.9 x 4.5 cm = volume: 96 mL. Echogenicity within normal limits. No mass or hydronephrosis visualized. Left Kidney: Renal measurements: 9.3 x 4.8 x 4.1 cm = volume: 94 mL. Echogenicity within normal limits. No mass or hydronephrosis visualized. Bladder: Appears normal for degree of bladder distention. Other: Gallstones noted within the gallbladder lumen. At least trace simple free fluid noted within the pelvis. IMPRESSION: 1. Unremarkable renal ultrasound. 2. Cholelithiasis. 3. At least trace simple free fluid within the pelvis. Electronically Signed   By: Iven Finn M.D.   On: 04/18/2020 23:04   DG CHEST PORT 1 VIEW  Result Date: 05/06/2020 CLINICAL DATA:  Central line placement EXAM: PORTABLE CHEST 1 VIEW COMPARISON:  May 05, 2020 FINDINGS: There is a new right central line with the distal tip in the central SVC. No pneumothorax. Stable tracheostomy tube. The cardiomediastinal silhouette is stable. Possible mild pulmonary venous congestion. Patchy infiltrate in the right lung, worsened in the interval. IMPRESSION: 1. Support apparatus as above. 2. New patchy pulmonary infiltrate on the right worrisome for pneumonia or aspiration. Recommend clinical correlation. 3. Possible mild pulmonary venous congestion. Electronically Signed   By: Dorise Bullion III M.D   On: 05/06/2020 15:04   DG CHEST PORT 1 VIEW  Result Date: 05/05/2020 CLINICAL DATA:  Respiratory  failure, history of CVA EXAM: PORTABLE CHEST 1 VIEW COMPARISON:  04/21/2020 chest radiograph. FINDINGS: Tracheostomy tube tip overlies the tracheal air column at the thoracic inlet. Stable cardiomediastinal silhouette with top-normal heart size. No pneumothorax. Patient's right upper extremity obscures the lateral right lung base. No significant pleural effusions. Similar hazy bibasilar lung opacities. No overt pulmonary edema. IMPRESSION: 1. Similar hazy bibasilar lung opacities, favor atelectasis, difficult to exclude a component of aspiration or pneumonia. 2. Tracheostomy tube appears well-positioned. Electronically Signed   By: Ilona Sorrel  M.D.   On: 05/05/2020 08:09   DG Chest Port 1 View  Result Date: 04/21/2020 CLINICAL DATA:  Respiratory failure EXAM: PORTABLE CHEST 1 VIEW COMPARISON:  CT 04/15/2020, radiograph 04/20/2020 FINDINGS: Endotracheal tube tip terminates in the mid trachea, 5 cm from the carina. Telemetry leads and external support devices overlie the chest. The patient's right hand overlies the right lung base as well. Diffuse heterogeneous opacities again seen throughout both lungs, with some interval clearing particularly in the left mid lung. No pneumothorax. Suspect layering left effusion. No visible right effusion. Additional bandlike opacities favoring subsegmental atelectasis. Stable cardiomediastinal contours with a calcified aorta. No acute osseous or soft tissue abnormality. IMPRESSION: Endotracheal tube tip terminates in the mid trachea, 5 cm from the carina. Improving bilateral airspace opacities may reflect some resolving edema or infection. Layering left effusion. Electronically Signed   By: Lovena Le M.D.   On: 04/21/2020 05:34   DG Chest Port 1 View  Result Date: 04/20/2020 CLINICAL DATA:  Acute respiratory distress EXAM: PORTABLE CHEST 1 VIEW COMPARISON:  04/19/2020 FINDINGS: Single frontal view of the chest demonstrates stable tracheostomy tube. Cardiac silhouette  remains enlarged. Widespread interstitial and ground-glass opacities are unchanged since the prior study. Trace bilateral pleural effusions are noted. No pneumothorax. IMPRESSION: 1. Stable widespread interstitial and ground-glass opacities consistent with edema or pneumonia. 2. Stable trace bilateral pleural effusions. Electronically Signed   By: Randa Ngo M.D.   On: 04/20/2020 02:44   DG CHEST PORT 1 VIEW  Result Date: 04/17/2020 CLINICAL DATA:  Pneumonia. EXAM: PORTABLE CHEST 1 VIEW COMPARISON:  April 15, 2020. FINDINGS: Stable cardiomediastinal silhouette. Tracheostomy tube is in good position. No pneumothorax is noted. Small loculated left pleural effusion is noted. Stable bilateral lung opacities are noted concerning for pulmonary edema. Bony thorax is unremarkable. IMPRESSION: Stable bilateral lung opacities are noted concerning for pulmonary edema. Small loculated left pleural effusion is noted. Aortic Atherosclerosis (ICD10-I70.0). Electronically Signed   By: Marijo Conception M.D.   On: 04/17/2020 08:27   DG Chest Port 1 View  Result Date: 04/15/2020 CLINICAL DATA:  Tachycardia and wheezing EXAM: PORTABLE CHEST 1 VIEW COMPARISON:  November 06, 2010. FINDINGS: Tracheostomy catheter tip is 4.3 cm above the carina. No pneumothorax. There is apparent interstitial pulmonary edema with partially loculated small left pleural effusion. Ill-defined opacity noted in left base. There is cardiomegaly with pulmonary venous hypertension. No adenopathy. There is aortic atherosclerosis. No bone lesions. IMPRESSION: Tracheostomy as described without evident pneumothorax. Cardiomegaly with a degree of pulmonary vascular congestion. Evidence of interstitial pulmonary edema with small loculated pleural effusion. The appearance is suspicious for a degree of underlying congestive heart failure. Focal airspace opacity in the left base is concerning for focus of pneumonia, likely superimposed on a degree of interstitial  pulmonary edema. Aortic Atherosclerosis (ICD10-I70.0). Electronically Signed   By: Lowella Grip III M.D.   On: 04/15/2020 13:55   DG Abd Portable 1V  Result Date: 05/09/2020 CLINICAL DATA:  Adynamic ileus. EXAM: PORTABLE ABDOMEN - 1 VIEW COMPARISON:  05/08/2020.  05/06/2020. FINDINGS: NG tube noted with its tip in stable position in the stomach. No bowel distention. No free air. Mild residual contrast in the right colon. Aortoiliac and visceral atherosclerotic vascular calcification. Destructive changes again noted about the right hip. IMPRESSION: 1. NG tube noted with tip over the stomach in stable position. No bowel distention. 2.  Aortoiliac and visceral atherosclerotic vascular disease. 3.  Destructive changes again noted about the right hip. Electronically Signed  By: Lance Creek   On: 05/09/2020 05:06   DG Abd Portable 1V  Result Date: 05/08/2020 CLINICAL DATA:  Advanced nasogastric tube, evaluate position EXAM: PORTABLE ABDOMEN - 1 VIEW COMPARISON:  Abdominal radiograph obtained earlier today FINDINGS: The gastric tube has advanced. The tip now overlies the gastric antrum in good position. Right IJ non tunneled hemodialysis catheter with the tip overlying the mid SVC. Tracheostomy tube is present with the tip midline and at the level of the clavicles. Stable cardiomegaly. Atherosclerotic calcification present in the transverse aorta. Marked pulmonary vascular congestion with mild interstitial edema. Unremarkable bowel gas pattern. IMPRESSION: 1. Well-positioned NG tube with the tip overlying the gastric antrum. 2. Right IJ non tunneled hemodialysis catheter with the tip at the mid SVC. 3. Well-positioned tracheostomy tube. 4. Stable cardiomegaly with pulmonary vascular congestion and mild interstitial edema. 5. Aortic atherosclerosis. Electronically Signed   By: Jacqulynn Cadet M.D.   On: 05/08/2020 07:55   DG Abd Portable 1V  Result Date: 05/08/2020 CLINICAL DATA:  Ileus. EXAM: PORTABLE  ABDOMEN - 1 VIEW COMPARISON:  05/06/2020. FINDINGS: NG tube tip noted in the upper most portion of the stomach. Advancement of approximately 10 cm should be considered. Multiple air-filled loops of small and large bowel noted suggesting adynamic ileus. No prominent bowel distention. No free air identified. Hemidiaphragms incompletely imaged. Aortoiliac and visceral atherosclerotic vascular calcification. Radiopacity over the right lower quadrant may represent residual barium. Degenerative change lumbar spine and both hips. IMPRESSION: 1. NG tube tip noted in the upper most portion stomach. Advancement of approximately 10 cm should be considered. 2. Multiple air-filled loops of small and large bowel noted suggesting adynamic ileus. No prominent bowel distention. 3.  Aortoiliac and visceral atherosclerotic vascular disease. Electronically Signed   By: Marcello Moores  Register   On: 05/08/2020 05:42   ECHOCARDIOGRAM COMPLETE  Result Date: 04/16/2020    ECHOCARDIOGRAM REPORT   Patient Name:   HILARY MILKS Date of Exam: 04/16/2020 Medical Rec #:  [id number]   Height:       67.0 in Accession #:    [id number]  Weight:       173.3 lb Date of Birth:  01-Oct-1940   BSA:          1.903 m Patient Age:    61 years    BP:           122/77 mmHg Patient Gender: F           HR:           72 bpm. Exam Location:  Inpatient Procedure: 2D Echo, Cardiac Doppler and Color Doppler Indications:    I48.2 Chronic atrial fibrillation; I48.91* Unspeicified atrial                 fibrillation  History:        Patient has no prior history of Echocardiogram examinations.                 Stroke; Risk Factors:Diabetes.  Sonographer:    Bernadene Person RDCS Referring Phys: [id number] [facility] A CHANDRASEKHAR IMPRESSIONS  1. Left ventricular ejection fraction, by estimation, is 55 to 60%. The left ventricle has normal function. The left ventricle has no regional wall motion abnormalities. Left ventricular diastolic function could not be evaluated.  2. Right  ventricular systolic function is normal. The right ventricular size is normal. There is moderately elevated pulmonary artery systolic pressure.  3. Left atrial size was mildly dilated.  4. The mitral valve is  grossly normal. Mild to moderate mitral valve regurgitation.  5. Tricuspid valve regurgitation is moderate.  6. The aortic valve is calcified. There is mild calcification of the aortic valve. There is mild thickening of the aortic valve. Aortic valve regurgitation is mild.  7. Aortic dilatation noted. There is mild dilatation of the ascending aorta, measuring 39 mm. FINDINGS  Left Ventricle: Left ventricular ejection fraction, by estimation, is 55 to 60%. The left ventricle has normal function. The left ventricle has no regional wall motion abnormalities. The left ventricular internal cavity size was normal in size. There is  no left ventricular hypertrophy. Left ventricular diastolic function could not be evaluated due to atrial fibrillation. Left ventricular diastolic function could not be evaluated. Right Ventricle: The right ventricular size is normal. No increase in right ventricular wall thickness. Right ventricular systolic function is normal. There is moderately elevated pulmonary artery systolic pressure. The tricuspid regurgitant velocity is 3.60 m/s, and with an assumed right atrial pressure of 8 mmHg, the estimated right ventricular systolic pressure is 36.1 mmHg. Left Atrium: Left atrial size was mildly dilated. Right Atrium: Right atrial size was normal in size. Pericardium: There is no evidence of pericardial effusion. Mitral Valve: The mitral valve is grossly normal. There is moderate thickening of the mitral valve leaflet(s). There is mild calcification of the mitral valve leaflet(s). Mild to moderate mitral annular calcification. Mild to moderate mitral valve regurgitation. Tricuspid Valve: The tricuspid valve is normal in structure. Tricuspid valve regurgitation is moderate. Aortic Valve: The  aortic valve is calcified. There is mild calcification of the aortic valve. There is mild thickening of the aortic valve. Aortic valve regurgitation is mild. Aortic regurgitation PHT measures 455 msec. Pulmonic Valve: The pulmonic valve was normal in structure. Pulmonic valve regurgitation is not visualized. Aorta: Aortic dilatation noted. There is mild dilatation of the ascending aorta, measuring 39 mm. IAS/Shunts: The atrial septum is grossly normal.  LEFT VENTRICLE PLAX 2D LVIDd:         5.10 cm LVIDs:         3.90 cm LV PW:         0.90 cm LV IVS:        1.00 cm LVOT diam:     2.00 cm LV SV:         49 LV SV Index:   26 LVOT Area:     3.14 cm  LV Volumes (MOD) LV vol d, MOD A2C: 96.4 ml LV vol d, MOD A4C: 84.5 ml LV vol s, MOD A2C: 39.2 ml LV vol s, MOD A4C: 40.4 ml LV SV MOD A2C:     57.2 ml LV SV MOD A4C:     84.5 ml LV SV MOD BP:      52.9 ml RIGHT VENTRICLE RV S prime:     6.94 cm/s TAPSE (M-mode): 1.1 cm LEFT ATRIUM             Index       RIGHT ATRIUM           Index LA diam:        3.70 cm 1.94 cm/m  RA Area:     17.00 cm LA Vol (A2C):   52.0 ml 27.33 ml/m RA Volume:   46.10 ml  24.23 ml/m LA Vol (A4C):   68.7 ml 36.11 ml/m LA Biplane Vol: 51.3 ml 26.96 ml/m  AORTIC VALVE LVOT Vmax:   93.38 cm/s LVOT Vmean:  63.350 cm/s LVOT VTI:    0.156  m AI PHT:      455 msec  AORTA Ao Root diam: 3.30 cm Ao Asc diam:  3.90 cm MR Peak grad:    89.1 mmHg   TRICUSPID VALVE MR Mean grad:    61.0 mmHg   TR Peak grad:   51.8 mmHg MR Vmax:         472.00 cm/s TR Vmax:        360.00 cm/s MR Vmean:        370.0 cm/s MR PISA:         0.57 cm    SHUNTS MR PISA Eff ROA: 5 mm       Systemic VTI:  0.16 m MR PISA Radius:  0.30 cm     Systemic Diam: 2.00 cm Mertie Moores MD Electronically signed by Mertie Moores MD Signature Date/Time: 04/16/2020/4:09:55 PM    Final     Lab Data:  CBC: Recent Labs  Lab 05/06/20 0520 05/07/20 0444 05/08/20 0406 05/08/20 1301 05/08/20 2025 05/09/20 0344 05/09/20 0612 05/09/20 1238  05/10/20 0307  WBC 7.9 11.0* 10.6*  --   --  9.9  --   --  12.2*  NEUTROABS 6.2 8.9* 9.2*  --   --  7.3  --   --  9.1*  HGB 8.3* 8.9* 7.0*   < > 7.4* 7.0* 6.9* 7.9* 7.4*  HCT 26.4* 28.3* 22.3*   < > 23.5* 21.2* 21.3* 24.8* 22.9*  MCV 94.6 93.7 96.1  --   --  93.0  --   --  94.2  PLT 217 270 229  --   --  171  --   --  158   < > = values in this interval not displayed.   Basic Metabolic Panel: Recent Labs  Lab 05/04/20 0024 05/05/20 0105 05/07/20 0444 05/08/20 0406 05/09/20 0344 05/09/20 1524 05/10/20 0307  NA 133*   < > 137 139 140 140 138  K 5.0   < > 4.7 4.1 4.3 4.4 3.6  CL 96*   < > 97* 100 102 103 102  CO2 24   < > 21* _0 GLUCOSE 182*   < > 133* 133* 152* 152* 150*  BUN 169*   < > 209* 132* 162* 173* 118*  CREATININE 4.68*   < > 4.92* 3.46* 3.95* 4.02* 3.09*  CALCIUM 8.5*   < > 9.8 9.3 9.1 9.1 8.7*  MG 1.9  --   --   --  2.1  --  2.0  PHOS  --    < > 7.3* 5.9* 7.3* 7.3* 5.3*   < > = values in this interval not displayed.   GFR: Estimated Creatinine Clearance: 17.3 mL/min (A) (by C-G formula based on SCr of 3.09 mg/dL (H)). Liver Function Tests: Recent Labs  Lab 05/07/20 0444 05/08/20 0406 05/09/20 0344 05/09/20 1524 05/10/20 0307  ALBUMIN 3.3* 2.9* 2.6* 2.7* 2.6*   No results for input(s): LIPASE, AMYLASE in the last 168 hours. No results for input(s): AMMONIA in the last 168 hours. Coagulation Profile: No results for input(s): INR, PROTIME in the last 168 hours. Cardiac Enzymes: No results for input(s): CKTOTAL, CKMB, CKMBINDEX, TROPONINI in the last 168 hours. BNP (last 3 results) No results for input(s): PROBNP in the last 8760 hours. HbA1C: No results for input(s): HGBA1C in the last 72 hours. CBG: Recent Labs  Lab 05/09/20 2332 05/10/20 0307 05/10/20 0712 05/10/20 1117 05/10/20 1530  GLUCAP 154* 150* 134* 157* 157*  Lipid Profile: No results for input(s): CHOL, HDL, LDLCALC, TRIG, CHOLHDL, LDLDIRECT in the last 72 hours. Thyroid  Function Tests: No results for input(s): TSH, T4TOTAL, FREET4, T3FREE, THYROIDAB in the last 72 hours. Anemia Panel: No results for input(s): VITAMINB12, FOLATE, FERRITIN, TIBC, IRON, RETICCTPCT in the last 72 hours. Urine analysis:    Component Value Date/Time   COLORURINE YELLOW 04/15/2020 1452   APPEARANCEUR HAZY (A) 04/15/2020 1452   LABSPEC 1.010 04/15/2020 1452   PHURINE 5.0 04/15/2020 1452   GLUCOSEU NEGATIVE 04/15/2020 1452   HGBUR MODERATE (A) 04/15/2020 1452   BILIRUBINUR NEGATIVE 04/15/2020 1452   KETONESUR NEGATIVE 04/15/2020 1452   PROTEINUR NEGATIVE 04/15/2020 1452   NITRITE NEGATIVE 04/15/2020 1452   LEUKOCYTESUR LARGE (A) 04/15/2020 1452       M.D. [facility] 05/10/2020, 4:19 PM  Available via Epic secure chat 7am-7pm After 7 pm, please refer to night coverage provider listed on amion.

## 2020-05-10 NOTE — Progress Notes (Signed)
NAME:  Andersen Fry, MRN:  [id number], DOB:  04-06-1940, LOS: 78 ADMISSION DATE:  04/15/2020, CONSULTATION DATE:  04/16/2020 REFERRING MD:  Dr. Myna Hidalgo, [facility] CHIEF COMPLAINT:  04/16/2020  Brief History:  45 yoF with chronic respiratory failure s/p trach sent from [facility] for Afib with RVR.  Patient is also being treated for pneumonia on zosyn pta.  CXR concerning for possible partially loculated effusion.  Pulmonary following for trach care.  Past Medical History:  HTN, DM, Chronic diastolic CHF, CVA with Rt side weakness, Seizure, Aspiration pneumonia, s/p trach and PEG, PAF  Significant Hospital Events:  3/13 Admitted [facility] 3/15 on trach collar 5L 28% 3/17 Worsening hypoxemia and increased secretions 3/18 PCCM called to see patient overnight secondary to increasing work of breathing and hypoxia secondary to possible aspiration event 3/19 air leak from trach >> improved after changing to pressure support; change to heparin gtt 3/20 transfuse 1 unit PRBC; off pressors 3/21 back on pressors; start midodrine and reduce cardizem dose. ATC tolerated for 7hrs 3/22 Trach changed to 8 Shiley cuffed due to leak  3/23 Some issues with trach bleeding post exchange but improved this AM.[facility] assumed as primary care, PCCM on for vent and trach care   Consults:  Cardiology Palliative care Nephrology  Procedures:   Trach exchanged 3/22 and 8 cuffed shiley placed   Significant Diagnostic Tests:   3/13 CTA PE >> pulmonary edema, small b/p effusions, small loculated component in minor fissure and oblique fissure  3/13 CT abd/ pelvis >> No acute findings within the abdomen.  3/16 renal u/s >> cholelithiasis, normal kidneys  Micro Data:  3/13 BCx>>negative 3/13 SARS 2/ flu >> negative 3/13 UCx >> 100k Vanc resistant enterococcus 3/14 MRSA >> negative 3/17 sputum >> Pseudomonas aeruginosa (resistant to fortaz and imipenem)  Antimicrobials:  Vancomycin 3/13 >> 3/14 Zosyn 3/13>>>off  Zithromax 3/13  >> 3/17 Linezolid 3/15 >> 3/17 Cefepime 3/13 >> 3/19  Interim History / Subjective:  Patient continues to have mild to moderate amount of secretions.  Remains on full vent support. Has tolerated some PSV trials earlier in week per RT.    Objective   Blood pressure (!) 101/56, pulse 96, temperature 98.9 F (37.2 C), temperature source Oral, resp. rate 20, height '5\' 7"'$  (1.702 m), weight 87.7 kg, SpO2 100 %.    Vent Mode: PRVC FiO2 (%):  [30 %] 30 % Set Rate:  [20 bmp] 20 bmp Vt Set:  [400 mL] 400 mL PEEP:  [5 cmH20] 5 cmH20 Plateau Pressure:  [18 cmH20-26 cmH20] 26 cmH20   Intake/Output Summary (Last 24 hours) at 05/10/2020 0951 Last data filed at 05/10/2020 0600 Gross per 24 hour  Intake 980.66 ml  Output 1650 ml  Net -669.34 ml   Filed Weights   05/09/20 1500 05/09/20 1746 05/10/20 0323  Weight: 89.8 kg 88.8 kg 87.7 kg    Physical Exam General: chronically ill appearing female, NAD on vent  HEENT: 8 cuffed Shiley trach clean and dry, Oropharynx otherwise clear. Neuro: alert, able to open mouth to command CV:  Irregularly irregular PULM:  Course breath sounds on the right. No wheezing. GI: soft, mildly distended, BS+ Extremities: Warm, dry, no edema, right AKA Skin: No rash, warm and dry  Resolved Hospital Problem list   VRE UTI, Aspiration pneumonitis with drug resistant Pseudomonas aeruginosa  Assessment & Plan:   Acute on chronic hypoxic respiratory failure from aspiration pneumonitis. Tracheostomy status. Secretions with frequent suctioning Mucus plug 4/1 P: Vent support - 8cc/kg  F/u CXR intermittently F/u ABG as needed  Continue attempts at PS wean Pulmonary hygiene standard trach care VAP prevention orders Continue goals of care discussions - at this point would likely need to return to vent SNF when ready for placement   Rest of acute and chronic medical conditions including but not limited to:are not managed per [facility] Hypotension from hypovolemia and  sedation. Loculated pleural effusions in minor fissure and oblique fissure. Atrial fibrillation Hx of CVA with Rt hemiparesis Seizure disorder Vegetative state AKI from ATN 2nd to hypoxia and hypotension. Hyperkalemia  Positive fluid balance  Anemia of critical illness and chronic disease. 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 Goals of care Pressure injury  PCCM will continue to manage vent and trach 1-2x/week, call if needed sooner   Best practice (evaluated daily)  Diet: tube feeds DVT prophylaxis: heparin gtt GI prophylaxis: protonix Mobility: bed rest Disposition: ICU Code Status: full code  Labs    CMP Latest Ref Rng & Units 05/10/2020 05/09/2020 05/09/2020  Glucose 70 - 99 mg/dL 150(H) 152(H) 152(H)  BUN 8 - 23 mg/dL 118(H) 173(H) 162(H)  Creatinine 0.44 - 1.00 mg/dL 3.09(H) 4.02(H) 3.95(H)  Sodium 135 - 145 mmol/L 138 140 140  Potassium 3.5 - 5.1 mmol/L 3.6 4.4 4.3  Chloride 98 - 111 mmol/L 102 103 102  CO2 22 - 32 mmol/L '24 23 22  '$ Calcium 8.9 - 10.3 mg/dL 8.7(L) 9.1 9.1  Total Protein 6.5 - 8.1 g/dL - - -  Total Bilirubin 0.3 - 1.2 mg/dL - - -  Alkaline Phos 38 - 126 U/L - - -  AST 15 - 41 U/L - - -  ALT 0 - 44 U/L - - -    CBC Latest Ref Rng & Units 05/10/2020 05/09/2020 05/09/2020  WBC 4.0 - 10.5 K/uL 12.2(H) - -  Hemoglobin 12.0 - 15.0 g/dL 7.4(L) 7.9(L) 6.9(LL)  Hematocrit 36.0 - 46.0 % 22.9(L) 24.8(L) 21.3(L)  Platelets 150 - 400 K/uL 158 - -    ABG    Component Value Date/Time   PHART 7.297 (L) 04/20/2020 1022   PCO2ART 51.8 (H) 04/20/2020 1022   PO2ART 335 (H) 04/20/2020 1022   HCO3 25.3 04/20/2020 1022   TCO2 27 04/20/2020 1022   ACIDBASEDEF 1.0 04/20/2020 1022   O2SAT 100.0 04/20/2020 1022    CBG (last 3)  Recent Labs    05/09/20 2332 05/10/20 0307 05/10/20 0712  GLUCAP 154* 150* 134*    Freda Jackson, MD [facility] Office: [phone]   See Amion for personal pager PCCM on call pager [phone]  until 7pm. Please call Elink 7p-7a. [phone]

## 2020-05-11 DIAGNOSIS — Z515 Encounter for palliative care: Secondary | ICD-10-CM | POA: Diagnosis not present

## 2020-05-11 DIAGNOSIS — J810 Acute pulmonary edema: Secondary | ICD-10-CM | POA: Diagnosis not present

## 2020-05-11 DIAGNOSIS — Z8673 Personal history of transient ischemic attack (TIA), and cerebral infarction without residual deficits: Secondary | ICD-10-CM | POA: Diagnosis not present

## 2020-05-11 DIAGNOSIS — R0603 Acute respiratory distress: Secondary | ICD-10-CM | POA: Diagnosis not present

## 2020-05-11 DIAGNOSIS — I4891 Unspecified atrial fibrillation: Secondary | ICD-10-CM | POA: Diagnosis not present

## 2020-05-11 DIAGNOSIS — D500 Iron deficiency anemia secondary to blood loss (chronic): Secondary | ICD-10-CM

## 2020-05-11 DIAGNOSIS — J9621 Acute and chronic respiratory failure with hypoxia: Secondary | ICD-10-CM | POA: Diagnosis not present

## 2020-05-11 LAB — RENAL FUNCTION PANEL
Albumin: 2.4 g/dL — ABNORMAL LOW (ref 3.5–5.0)
Anion gap: 14 (ref 5–15)
BUN: 123 mg/dL — ABNORMAL HIGH (ref 8–23)
CO2: 24 mmol/L (ref 22–32)
Calcium: 8.7 mg/dL — ABNORMAL LOW (ref 8.9–10.3)
Chloride: 102 mmol/L (ref 98–111)
Creatinine, Ser: 3.49 mg/dL — ABNORMAL HIGH (ref 0.44–1.00)
GFR, Estimated: 13 mL/min — ABNORMAL LOW (ref >60)
Glucose, Bld: 167 mg/dL — ABNORMAL HIGH (ref 70–99)
Phosphorus: 5.8 mg/dL — ABNORMAL HIGH (ref 2.5–4.6)
Potassium: 3.5 mmol/L (ref 3.5–5.1)
Sodium: 140 mmol/L (ref 135–145)

## 2020-05-11 LAB — CBC WITH DIFFERENTIAL/PLATELET
Abs Immature Granulocytes: 0.07 10*3/uL (ref 0.00–0.07)
Basophils Absolute: 0 10*3/uL (ref 0.0–0.1)
Basophils Relative: 0 %
Eosinophils Absolute: 0.5 10*3/uL (ref 0.0–0.5)
Eosinophils Relative: 4 %
HCT: 22.1 % — ABNORMAL LOW (ref 36.0–46.0)
Hemoglobin: 6.8 g/dL — CL (ref 12.0–15.0)
Immature Granulocytes: 1 %
Lymphocytes Relative: 8 %
Lymphs Abs: 1 10*3/uL (ref 0.7–4.0)
MCH: 29.7 pg (ref 26.0–34.0)
MCHC: 30.8 g/dL (ref 30.0–36.0)
MCV: 96.5 fL (ref 80.0–100.0)
Monocytes Absolute: 0.7 10*3/uL (ref 0.1–1.0)
Monocytes Relative: 6 %
Neutro Abs: 10.1 10*3/uL — ABNORMAL HIGH (ref 1.7–7.7)
Neutrophils Relative %: 81 %
Platelets: 135 10*3/uL — ABNORMAL LOW (ref 150–400)
RBC: 2.29 MIL/uL — ABNORMAL LOW (ref 3.87–5.11)
RDW: 19.7 % — ABNORMAL HIGH (ref 11.5–15.5)
WBC: 12.4 10*3/uL — ABNORMAL HIGH (ref 4.0–10.5)
nRBC: 0.6 % — ABNORMAL HIGH (ref 0.0–0.2)

## 2020-05-11 LAB — GLUCOSE, CAPILLARY
Glucose-Capillary: 124 mg/dL — ABNORMAL HIGH (ref 70–99)
Glucose-Capillary: 135 mg/dL — ABNORMAL HIGH (ref 70–99)
Glucose-Capillary: 137 mg/dL — ABNORMAL HIGH (ref 70–99)
Glucose-Capillary: 145 mg/dL — ABNORMAL HIGH (ref 70–99)
Glucose-Capillary: 149 mg/dL — ABNORMAL HIGH (ref 70–99)
Glucose-Capillary: 172 mg/dL — ABNORMAL HIGH (ref 70–99)

## 2020-05-11 LAB — PREPARE RBC (CROSSMATCH)

## 2020-05-11 MED ORDER — SODIUM CHLORIDE 0.9% FLUSH: 10.0000 mL | INTRAVENOUS | Status: DC | PRN

## 2020-05-11 MED ORDER — SODIUM CHLORIDE 0.9 % IV SOLN: INTRAVENOUS | Status: DC | PRN

## 2020-05-11 MED ORDER — SODIUM CHLORIDE 0.9% FLUSH
10.0000 mL | Freq: Two times a day (BID) | INTRAVENOUS | Status: DC
  Administered 2020-05-11 (×2): 10 mL

## 2020-05-11 MED ORDER — SODIUM CHLORIDE 0.9% IV SOLUTION: Freq: Once | INTRAVENOUS | Status: AC

## 2020-05-11 NOTE — Progress Notes (Signed)
[facility] NEPHROLOGY PROGRESS NOTE  Assessment/ Plan: Pt is a 80 y.o. yo female  with PMH of DM, HFpEF, CVA with right-sided hemiparesis/contracture, s/p right BKA, PEG tube, chronic respiratory failure status post tracheostomy, she was recently at [facility] where treated for HCAP developed A. fib with RVR, hypotension and was sent to ER.  Acute kidney injury: Cr 1 as of 04/15/20 now with severe AKI. Suspect hemodynamically mediated AKI in the setting of A. fib with RVR, IV contrast, recent infection.  UA without proteinuria or microscopic hematuria.  Kidney ultrasound ruled out obstruction.   -Her UOP was improving through the week with stable Cr but in the setting of worsening azotemia was given a trial of albumin volume expansion without improvement.  -Have had numerous discussions with Rebecca Harrell re: dialysis.  Recommend against it in light of comorbids and not a candidate for long term.  She desires a short term trial to see if a bit more time will improve renal function.  s/p temp cath 05/06/20 via PCCM and we will do a 1 week trial of HD.  If unable to liberate from HD would need to move to palliative measures.  Dr Johnney Ou discussed with dtr 05/06/20.  Of note palliative care and ethics involved in discussion too - Continue max supportive care. -daily labs, monitor strict I/O please - tolerated HD 05/07/20, HD 05/09/20.  Next HD 05/11/20 which will complete our week of HD trial.  She is not recovering.  It appears that her body is shutting down.  I do not think dialysis is going to fix the overall MSOD and trajectory that she's on.  Since today is the last day of the previously agreed-upon HD trial, and pt is showing no signs of recovery, we will not offer continued dialytic support beyond today.   - we will follow peripherally over the weekend- will not see unless called  Hypotension, h/o HTN - on midodrine with normal BP  Acute on chronic hypoxic respiratory failure, h/o  trach: Currently on vent in ICU.  PCCM is following.  Severe sepsis/Healthcare associated pneumonia/septic shock: s/p broad-spectrum antibiotics per primary team.  Off pressors, on midodrine  A. fib with RVR: On dilt, eliquis being held   Malnutrition:  Supplement per RD.  GI bleed: severity of illness precludes scopes per GI  Subjective:  Unresponsive to voice or touch.  Still has GI bleeding, Hgb 6.8 now.  For HD today.    Objective Vital signs in last 24 hours: Vitals:   05/11/20 0700 05/11/20 0730 05/11/20 0800 05/11/20 0825  BP: 131/83 128/87    Pulse: (!) 107 (!) 105    Resp: (!) 27 (!) 24    Temp:   98 F (36.7 C)   TempSrc:   Axillary   SpO2: 100% 99% 100% 99%  Weight:      Height:       Weight change: -1 kg  Intake/Output Summary (Last 24 hours) at 05/11/2020 1003 Last data filed at 05/11/2020 0700 Gross per 24 hour  Intake 1464.05 ml  Output 425 ml  Net 1039.05 ml       Labs: Basic Metabolic Panel: Recent Labs  Lab 05/09/20 1524 05/10/20 0307 05/11/20 0448  NA 140 138 140  K 4.4 3.6 3.5  CL 103 102 102  CO2 '23 24 24  ' GLUCOSE 152* 150* 167*  BUN 173* 118* 123*  CREATININE 4.02* 3.09* 3.49*  CALCIUM 9.1 8.7* 8.7*  PHOS 7.3* 5.3* 5.8*  Liver Function Tests: Recent Labs  Lab 05/09/20 1524 05/10/20 0307 05/11/20 0448  ALBUMIN 2.7* 2.6* 2.4*   No results for input(s): LIPASE, AMYLASE in the last 168 hours. No results for input(s): AMMONIA in the last 168 hours. CBC: Recent Labs  Lab 05/07/20 0444 05/08/20 0406 05/08/20 1301 05/09/20 0344 05/09/20 0612 05/09/20 1238 05/10/20 0307 05/11/20 0446  WBC 11.0* 10.6*  --  9.9  --   --  12.2* 12.4*  NEUTROABS 8.9* 9.2*  --  7.3  --   --  9.1* 10.1*  HGB 8.9* 7.0*   < > 7.0*   < > 7.9* 7.4* 6.8*  HCT 28.3* 22.3*   < > 21.2*   < > 24.8* 22.9* 22.1*  MCV 93.7 96.1  --  93.0  --   --  94.2 96.5  PLT 270 229  --  171  --   --  158 135*   < > = values in this interval not displayed.   Cardiac  Enzymes: No results for input(s): CKTOTAL, CKMB, CKMBINDEX, TROPONINI in the last 168 hours. CBG: Recent Labs  Lab 05/10/20 1530 05/10/20 1931 05/10/20 2324 05/11/20 0344 05/11/20 0714  GLUCAP 157* 137* 165* 145* 149*    Iron Studies:  No results for input(s): IRON, TIBC, TRANSFERRIN, FERRITIN in the last 72 hours. Studies/Results: No results found.  Medications: Infusions: . sodium chloride Stopped (05/05/20 1003)  . sodium chloride    . sodium chloride    . sodium chloride 10 mL/hr at 05/11/20 0700  . amiodarone Stopped (05/10/20 1242)  . feeding supplement (VITAL AF 1.2 CAL) 1,000 mL (05/11/20 1000)  . levETIRAcetam Stopped (05/10/20 2234)    Scheduled Medications: . sodium chloride   Intravenous Once  . amiodarone  200 mg Per Tube Daily  . chlorhexidine gluconate (MEDLINE KIT)  15 mL Mouth Rinse BID  . Chlorhexidine Gluconate Cloth  6 each Topical Daily  . darbepoetin (ARANESP) injection - DIALYSIS  100 mcg Intravenous Q Wed-HD  . guaiFENesin  5 mL Per Tube Q12H  . insulin aspart  0-15 Units Subcutaneous Q4H  . mouth rinse  15 mL Mouth Rinse 10 times per day  . midodrine  5 mg Per Tube Q8H  . pantoprazole (PROTONIX) IV  40 mg Intravenous Q12H  . QUEtiapine  25 mg Per Tube QHS  . sodium chloride flush  10-40 mL Intracatheter Q12H  . sodium chloride flush  10-40 mL Intracatheter Q12H    have reviewed scheduled and prn medications.  Physical Exam: General: Chronically ill looking female, on mechanical ventilation, unresponsive to touch or voice HEENT: NGT   Neck: +trach Heart:RRR, s1s2 nl Lungs: coarse BL, on vent via trach Abdomen:soft,  non-distended Extremities: 1+ anasarca, Contracture of upper extremities, right AKA. Neurology: unresponsive to voice or touch   Rebecca Harrell 05/11/2020,10:03 AM  LOS: 26 days

## 2020-05-11 NOTE — Progress Notes (Signed)
Palliative Care Progress Note  Reason for consult: Goals of care  I saw and examined Ms. Bucker today.  She is awake but not really interactive.  She does not track or follow commands.  I met with her daughter [name] today.  We discussed that today is the last day of dialysis trial.  Discussed concern that we are going to continue to see worsening due to renal failure.  We discussed the following: 1) She has multisystem failure, including renal failure.  Her renal function in not improving despite medical interventions. 2) She is not a candidate for long term dialysis and she has not had recovery of renal function to this point with short term dialysis trial.  Last session of dialysis today. 3) No plan to continue dialysis after today's session.  If her renal function does not improve following last dialysis session tomorrow, the expected outcome of renal failure in someone who is not a candidate for long term dialysis is death 4) Offering resuscitation at the time of an expected death with a non-reversible cause is non-beneficial and therefore not medically appropriate  [name] continues to struggle with this.  We called her brother's and set up a family meeting for Sunday at 1530.  Hopefully this will give time to determine if her kidney failure continues and will allow [name] to continue conversation on goals in light of continued multisystem failure.  Total time: 45 minutes  Greater than 50%  of this time was spent counseling and coordinating care related to the above assessment and plan.  [name], MD [place] [facility] [phone]

## 2020-05-11 NOTE — Progress Notes (Addendum)
[facility] Progress Note  CC:  Ileus, hematochezia  Subjective: Patient is unresponsive. No family at the bedside. No acute changes as discussed with the patient's RN. Dark brown liquid stool is less bloody over night and thus far this am.   Objective:  Vital signs in last 24 hours: Temp:  [98 F (36.7 C)-99.9 F (37.7 C)] 98 F (36.7 C) (04/08 0800) Pulse Rate:  [94-141] 105 (04/08 0730) Resp:  [20-29] 24 (04/08 0730) BP: (76-145)/(25-111) 128/87 (04/08 0730) SpO2:  [93 %-100 %] 99 % (04/08 0825) FiO2 (%):  [30 %] 30 % (04/08 0825) Weight:  [88.8 kg] 88.8 kg (04/08 0500) Last BM Date: 05/11/20 General:   Unresponsive.  Eyes: Bilateral upward gaze deviation.  Heart: RRR, no murmur.  Neck: RIJ catheter.  Pulm:  Breath sounds clear, slightly coarse on vent with trache.  Abdomen: Obese abdomen with diffuse anasarca. PEG tube below the xyphoid process intact with TF. Few bowel sounds auscultated x 4 quadrants.  Rectal: Flexiseal with dark brown liquid, not maroon or red bloody as seen previously.  Extremities:  Anasarca. LLE 1+ edema. RUE contracture. Right AKA. Neurologic:  Unresponsive.    Intake/Output from previous day: 04/07 0701 - 04/08 0700 In: 1530.7 [I.V.:503; NG/GT:827.7; IV Piggyback:100] Out: 425 [Urine:300; Stool:125] Intake/Output this shift: No intake/output data recorded.  Lab Results: Recent Labs    05/09/20 0344 05/09/20 0612 05/09/20 1238 05/10/20 0307 05/11/20 0446  WBC 9.9  --   --  12.2* 12.4*  HGB 7.0*   < > 7.9* 7.4* 6.8*  HCT 21.2*   < > 24.8* 22.9* 22.1*  PLT 171  --   --  158 135*   < > = values in this interval not displayed.   BMET Recent Labs    05/09/20 1524 05/10/20 0307 05/11/20 0448  NA 140 138 140  K 4.4 3.6 3.5  CL 103 102 102  CO2 _0 GLUCOSE 152* 150* 167*  BUN 173* 118* 123*  CREATININE 4.02* 3.09* 3.49*  CALCIUM 9.1 8.7* 8.7*   LFT Recent Labs    05/11/20 0448  ALBUMIN 2.4*    PT/INR No results for input(s): LABPROT, INR in the last 72 hours. Hepatitis Panel No results for input(s): HEPBSAG, HCVAB, HEPAIGM, HEPBIGM in the last 72 hours.  No results found.  Assessment / Plan:  [age] yearwith a complex medical history including CVA with right sided hemiparesis, chronic respiratory failure s/p trache and PEG placement 02/2020 admitted to the hospital 04/15/2020 with atrial fibrillation with RVR with subsequent development ofacute on chronic anemiawith acuteUGI bleedingwith red blood with clots from her NG tube and maroon blood from the flexi seal bag on 4/5.Hg7.0->6.1. Transfused one unit of PRBCs -> Hg 7.4 -> Hg 6.9. Transfused 1 unit of PRBCs 4/6 -> Hg 7.9 -> 7.4 -Today Hg 6.8.  Dark brown liquid stool in flexiseal, less bloody overnight despite drop in Hg.  One unit of PRBCs ordered by Dr. Tana Coast, not yet transfused.  -Check H/H post transfusion  -Continue PPI IV bid -Continue tube feedings as tolerated   -Continue to hold Eliquis  -No plans for endoscopic evaluation at this time as patient is at high risk forprocedurecomplications secondary to numerous comorbidities. Transfuse to maintain Hg>7. Further recommendation per Dr. Silverio Decamp. -Palliative Care met with daughter POA on 4/6, daughter requests full care. Patient remains full code.  Dr. Domingo Cocking with Palliative Care discussed if patient's renal function does not improve following her last dialysis  session today he will revisit with the family with recommendations to change code status to DNR  2. Respiratory failures/p trache on theventilator -Management per the medical team  3.Paroxysmal atrial fibrillation with RVR. On Amiodarone. Eliquis on hold since 4/1.  4. Chronic anemia, multifactorial with GI bleed as noted in # 1  5. Adynamic ileus per abdominal xray without bowel distension 4/5. K+ 4.1.  -Turn patient Q 2 hours if tolerated   6. AcuteRenal failure. RIJ dialysis catheter.  Likely final dialysis session today.       Principal Problem:   Atrial fibrillation with rapid ventricular response (HCC) Active Problems:   History of CVA (cerebrovascular accident)   Normocytic anemia   Tracheostomy dependence (HCC)   Hypotension   Chronic pulmonary aspiration   Seizure disorder (HCC)   Acute on chronic respiratory failure with hypoxia (HCC)   Pneumonia   Hypertension associated with diabetes ([place])   Acute respiratory distress   AKI (acute kidney injury) ([place])   VRE (vancomycin resistant enterococcus) culture positive   Pressure injury of skin   Chronic respiratory failure ([place])   Palliative care by specialist   Goals of care, counseling/discussion     LOS: 26 days   Patrecia Pour Kennedy-Smith  05/11/2020, 9:04 AM   Attending physician's note   I have taken an interval history, reviewed the chart and examined the patient. I agree with the Advanced Practitioner's note, impression and recommendations.   80 year old female with multiple comorbidities, chronic respiratory failure status post trach/PEG, CVA with right side hemiparesis,  admitted with A. fib with RVR  Acute on chronic anemia, had clots suction through NG tube and also had hematochezia  She is having dark brown/bilious appearing stool through flex sig tube  Continue IV PPI twice daily Continue tube feeds as tolerated Eliquis is currently on hold  Primary team and palliative care is discussing goals of care with patient and family  ESRD, not a candidate for long-term dialysis.  We will hold off endoscopic evaluation unless if it is needed for therapeutic intervention  Monitor hemoglobin and transfuse as needed  GI is available if needed, will not round on patient during this weekend.  Please call with any questions   I have spent 35 minutes of patient care (this includes precharting, chart review, review of results, face-to-face time used for counseling as well as treatment plan and  follow-up. The patient was provided an opportunity to ask questions and all were answered. The patient agreed with the plan and demonstrated an understanding of the instructions.  Damaris Hippo , MD [phone]

## 2020-05-11 NOTE — Progress Notes (Signed)
[facility]                                                                              Patient Demographics  Rebecca Harrell, is a 80 y.o. female, DOB - 10/13/1940, AEW:[id number]  Admit date - 04/15/2020   Admitting Physician Vianne Bulls, MD  Outpatient Primary MD for the patient is Shackleford, Vicie Mutters, MD  Outpatient specialists:   LOS - 26  days   Medical records reviewed and are as summarized below:    Chief Complaint  Patient presents with  . Tachycardia       Brief summary   Patient is a 80 year old female with hypertension, type 2 diabetes mellitus, diastolic heart failure, seizures, CVA with right-sided hemiparesis, paroxysmal atrial fibrillation, chronic hypoxic respiratory failure status post tracheostomy, dysphagia status post PEG tube.  Patient is a [place] resident, transferred to acute care due to persistent tachycardia, refractory to intravenous metoprolol.  At the time of transfer, O2 sats mid 90s on 8 L supplemental O2, respiratory rate 30, HR 130, BP 86/45.  Patient was noted to have coarse breath sounds bilaterally, scattered rhonchi, irregularly irregular tachycardia.  Patient was diagnosed with A. fib with RVR, complicated with acute on chronic hypoxic respiratory failure, aspiration pneumonitis.  Patient was admitted to ICU, placed on broad-spectrum antibiotic therapy.  Bronch positive for Pseudomonas.  Patient was placed on anticoagulation with heparin drip, required 1 unit packed RBC transfusion.  Developed intermittent hypotension requiring vasopressors, transitioned to midodrine.  Tracheostomy change 3/22. Patient was transferred to [facility] service on 3/23. On 4/1, discussed with nephrology, Dr. Johnney Ou, ICU team, Dr. Elsworth Soho and ethics committee representative, Dr. Verlon Au.  Patient had nonoliguric severe AKI likely secondary to ATN.  Long-term prognosis guarded. On 4/2, patient was noted to be volume overloaded, trial for temporary  hemodialysis  On 4/3, patient had temporary HD cath placement by CCM, developed ileus, tube feeds placed on hold, NGT placed 4/5: Developed GI bleeding with bloody BMs 4/6: Goals of care discussion with patient's family  Assessment & Plan    Acute on chronic respiratory failure with hypoxia -Trach management per pulmonology -Plan to return to [facility] where she can continue with invasive mechanical ventilation.  AKI with ATN, nonoliguric -Likely worsening due to atrial fibrillation with RVR, IV contrast, recent infection, ruled out obstruction. -Nephrology following, recommended against long-term hemodialysis.  However family requested short-term trial to see if improves renal function. -Temporary HD cath placed on 4/3, per nephrology, last HD today 4/8 completes 1 week of HD trial.  However no meaningful recovery, overall multiorgan dysfunction and poor prognosis.  Nephrology cannot offer HD support after today. -Discussed with palliative medicine Domingo Cocking, will discuss with family regarding discontinuation of HD   Acute GI bleed, ABLA on anemia of chronic disease -Hemoglobin again down to 6.9 this morning, transfused 1 unit packed RBCs.  Received 1 unit on 05/08/2020 -Xarelto has been held since 4/2 for HD cath placement and has not been resumed. -Continue to hold Xarelto, reviewed GI recommendations, endoscopic procedure would be a very high risk due to her current condition, recommended comfort care. -Hemoglobin 6.8, transfuse 1  unit packed RBC until goals of care are clarified  Ileus, acute -Improved, tolerating tube feeds  Paroxysmal atrial fibrillation with RVR -Oral amiodarone was on hold due to ileus. -Continue to hold Xarelto, not a candidate for anticoagulation given GI bleed -Resumed amiodarone via tube  Hypotension with history of hypertension -BP currently stable  Diabetes mellitus type 2, IDDM -Continue sliding scale insulin  History of CVA, right-sided  hemiparesis, dysphagia Continue tracheostomy, PEG tube.  -Continue IV Keppra   Pressure injury, -Stage II coccyx, POA  Severe sepsis/HCAP, septic shock -Status post broad-spectrum antibiotics per CCM, off pressors  Goals of care Please see my note from 4/6 Discussions in progress with family regarding goals of care.  Obesity Estimated body mass index is 30.66 kg/m as calculated from the following:   Height as of this encounter: _0  (1.702 m).   Weight as of this encounter: 88.8 kg.  Code Status: Full code DVT Prophylaxis:  Eliquis currently on hold due to ileus   Level of Care: Level of care: ICU Family Communication: Discussed with patient's son [name] face-to-face, discussed with patient's son [name] and daughter on the phone yesterday.   Disposition Plan:     Status is: Inpatient  Remains inpatient appropriate because:Inpatient level of care appropriate due to severity of illness   Dispo: The patient is from: [facility]              Anticipated d/c is to: LTAC              Patient currently is not medically stable to d/c.   Difficult to place patient No      Time Spent in minutes 45 minutes  Procedures:    Consultants:   [facility]  Antimicrobials:   Anti-infectives (From admission, onward)   Start     Dose/Rate Route Frequency Ordered Stop   04/19/20 1115  ceFEPIme (MAXIPIME) 2 g in sodium chloride 0.9 % 100 mL IVPB        2 g 200 mL/hr over 30 Minutes Intravenous Every 24 hours 04/18/20 1503 04/20/20 1051   04/19/20 1102  ceFEPIme (MAXIPIME) 2 g in sodium chloride 0.9 % 100 mL IVPB  Status:  Discontinued        2 g 200 mL/hr over 30 Minutes Intravenous Every 24 hours 04/18/20 1501 04/18/20 1503   04/17/20 1100  linezolid (ZYVOX) IVPB 600 mg        600 mg 300 mL/hr over 60 Minutes Intravenous Every 12 hours 04/17/20 1002 04/20/20 0032   04/16/20 1400  vancomycin (VANCOREADY) IVPB 1250 mg/250 mL  Status:   Discontinued        1,250 mg 166.7 mL/hr over 90 Minutes Intravenous Every 24 hours 04/15/20 1436 04/17/20 0813   04/16/20 0600  ceFEPIme (MAXIPIME) 2 g in sodium chloride 0.9 % 100 mL IVPB  Status:  Discontinued        2 g 200 mL/hr over 30 Minutes Intravenous Every 12 hours 04/15/20 2350 04/18/20 1501   04/16/20 0000  azithromycin (ZITHROMAX) 500 mg in sodium chloride 0.9 % 250 mL IVPB        500 mg 250 mL/hr over 60 Minutes Intravenous Every 24 hours 04/15/20 2350 04/20/20 0100   04/15/20 1700  piperacillin-tazobactam (ZOSYN) IVPB 3.375 g        3.375 g 12.5 mL/hr over 240 Minutes Intravenous  Once 04/15/20 1646 04/15/20 2101   04/15/20 1315  vancomycin (VANCOREADY) IVPB 1500 mg/300 mL  1,500 mg 150 mL/hr over 120 Minutes Intravenous  Once 04/15/20 1305 04/15/20 1617         Medications  Scheduled Meds: . sodium chloride   Intravenous Once  . amiodarone  200 mg Per Tube Daily  . chlorhexidine gluconate (MEDLINE KIT)  15 mL Mouth Rinse BID  . Chlorhexidine Gluconate Cloth  6 each Topical Daily  . darbepoetin (ARANESP) injection - DIALYSIS  100 mcg Intravenous Q Wed-HD  . guaiFENesin  5 mL Per Tube Q12H  . insulin aspart  0-15 Units Subcutaneous Q4H  . mouth rinse  15 mL Mouth Rinse 10 times per day  . midodrine  5 mg Per Tube Q8H  . pantoprazole (PROTONIX) IV  40 mg Intravenous Q12H  . QUEtiapine  25 mg Per Tube QHS  . sodium chloride flush  10-40 mL Intracatheter Q12H  . sodium chloride flush  10-40 mL Intracatheter Q12H   Continuous Infusions: . sodium chloride Stopped (05/05/20 1003)  . sodium chloride    . sodium chloride    . sodium chloride 10 mL/hr at 05/11/20 0700  . amiodarone Stopped (05/10/20 1242)  . feeding supplement (VITAL AF 1.2 CAL) 1,000 mL (05/11/20 1000)  . levETIRAcetam Stopped (05/10/20 2234)   PRN Meds:.sodium chloride, sodium chloride, sodium chloride, acetaminophen, alteplase, dextrose, fentaNYL (SUBLIMAZE) injection, heparin,  ipratropium-albuterol, lidocaine (PF), lidocaine-prilocaine, metoprolol tartrate, ondansetron (ZOFRAN) IV, oxyCODONE, pentafluoroprop-tetrafluoroeth, sodium chloride flush, sodium chloride flush      Subjective:   Rebecca Harrell was seen and examined today.  Frail, ill-appearing, does not follow any commands.  No meaningful recovery or change in status.    Objective:   Vitals:   05/11/20 1345 05/11/20 1400 05/11/20 1415 05/11/20 1430  BP: 126/82 (!) 121/96 120/80 122/87  Pulse: (!) 111 (!) 113 (!) 111 (!) 111  Resp: (!) 23 (!) 24 (!) 24 (!) 24  Temp:      TempSrc:      SpO2:      Weight:      Height:        Intake/Output Summary (Last 24 hours) at 05/11/2020 1445 Last data filed at 05/11/2020 1008 Gross per 24 hour  Intake 1312 ml  Output 425 ml  Net 887 ml     Wt Readings from Last 3 Encounters:  05/11/20 88.8 kg   Physical Exam  General: Awake, ill-appearing, on vent, trach  Cardiovascular: S1 S2 clear, tachycardia  Respiratory: Coarse BS  Gastrointestinal: Soft, PEG tube  Ext: right AKA, anasarca, contractures upper extremity y  Neuro: does not follow commands  Skin: No rashes  Psych: does not follow any verbal commands      Data Reviewed:  I have personally reviewed following labs and imaging studies  Micro Results No results found for this or any previous visit (from the past 240 hour(s)).  Radiology Reports CT ABDOMEN WO CONTRAST  Result Date: 04/15/2020 CLINICAL DATA:  Possible G-tube malpositioning EXAM: CT ABDOMEN WITHOUT CONTRAST TECHNIQUE: Multidetector CT imaging of the abdomen was performed following the standard protocol without IV contrast. COMPARISON:  CT chest same day FINDINGS: Lower chest: Cardiomegaly. Coronary artery calcifications. Small bilateral pleural effusions with patchy bibasilar opacities. Hepatobiliary: Unremarkable unenhanced appearance of the liver. No focal liver lesion identified. Gallbladder is decompressed. No hyperdense  gallstone. No biliary dilatation. Pancreas: Unremarkable. No pancreatic ductal dilatation or surrounding inflammatory changes. Spleen: Grossly unremarkable. Adrenals/Urinary Tract: No definite adrenal nodule. Kidneys appear within normal limits. Excreted contrast within the bilateral renal collecting systems. No hydronephrosis. Stomach/Bowel:  Percutaneous gastrostomy tube is appropriately positioned with balloon located in the anterior aspect of the distal gastric body. No evidence of outlet obstruction. Visualized bowel loops within the upper abdomen are within normal limits. No evidence of obstruction or inflammation. Vascular/Lymphatic: Extensive atherosclerosis throughout the aortoiliac axis. No upper abdominal lymphadenopathy. Other: No ascites or pneumoperitoneum. Musculoskeletal: No acute findings. IMPRESSION: 1. Percutaneous gastrostomy tube is appropriately positioned with balloon located intraluminally in the anterior aspect of the distal gastric body. No evidence of outlet obstruction. 2. No acute findings within the abdomen. 3. Small bilateral pleural effusions with patchy bibasilar opacities. Aortic Atherosclerosis (ICD10-I70.0). Electronically Signed   By: Davina Poke D.O.   On: 04/15/2020 16:16   DG Chest 2 View  Result Date: 04/19/2020 CLINICAL DATA:  Dyspnea, diabetes mellitus EXAM: CHEST - 2 VIEW COMPARISON:  Portable exam 1504 hours compared to 04/17/2020 FINDINGS: Tracheostomy tube unchanged. Enlargement of cardiac silhouette. Atherosclerotic calcifications aorta. Extensive BILATERAL pulmonary infiltrates, slightly increased. This could represent pulmonary edema or multifocal pneumonia. Small LEFT pleural effusion. No pneumothorax or acute osseous findings. IMPRESSION: BILATERAL pulmonary infiltrates question pulmonary edema versus multifocal pneumonia. Enlargement of cardiac silhouette with small LEFT pleural effusion. Electronically Signed   By: Lavonia Dana M.D.   On: 04/19/2020 16:40    DG Abd 1 View  Result Date: 05/06/2020 CLINICAL DATA:  NG tube placement. EXAM: ABDOMEN - 1 VIEW COMPARISON:  KUB and chest x-ray earlier today. FINDINGS: Interval placement of nasogastric tube with tip and side-port over the stomach in the left upper quadrant. Visualized bowel gas pattern is nonobstructive. Remainder of the exam is unchanged. IMPRESSION: Nasogastric tube with tip and side-port over the stomach in the left upper quadrant. Electronically Signed   By: Marin Olp M.D.   On: 05/06/2020 16:53   DG Abd 1 View  Result Date: 05/06/2020 CLINICAL DATA:  Ileus. EXAM: ABDOMEN - 1 VIEW COMPARISON:  November 06, 2010 FINDINGS: The lung bases were not imaged. Air-filled large and small bowel suggest ileus. Early or partial small bowel obstruction not completely excluded as there is no gas in the region of the rectum. No free air, portal venous gas, or pneumatosis. The right hip is abnormal and there is either bony erosion or a fracture. IMPRESSION: 1. Air-filled prominent loops of large and small bowel favored to represent ileus given history. Early or partial small bowel obstruction not completely excluded. Recommend close attention on follow-up. 2. Abnormal right hip with either a comminuted fracture or bony erosion, incompletely evaluated. Recommend clinical correlation and dedicated imaging. Electronically Signed   By: Dorise Bullion III M.D   On: 05/06/2020 15:07   CT Angio Chest PE W and/or Wo Contrast  Result Date: 04/15/2020 CLINICAL DATA:  Tachycardia EXAM: CT ANGIOGRAPHY CHEST WITH CONTRAST TECHNIQUE: Multidetector CT imaging of the chest was performed using the standard protocol during bolus administration of intravenous contrast. Multiplanar CT image reconstructions and MIPs were obtained to evaluate the vascular anatomy. CONTRAST:  21m OMNIPAQUE IOHEXOL 350 MG/ML SOLN COMPARISON:  04/15/2020 FINDINGS: Cardiovascular: Satisfactory opacification of the pulmonary arteries to the segmental  level. No evidence of pulmonary embolism. Thoracic aorta is nonaneurysmal. Common origin of the brachiocephalic and left common carotid arteries. Tortuosity of the descending thoracic aorta. Atherosclerotic calcifications of the aorta and coronary arteries. Heart size is mildly enlarged. No pericardial effusion. Mediastinum/Nodes: Mildly prominent mediastinal lymph nodes including 12 mm precarinal node (series 5, image 37). No enlarged axillary or hilar lymph nodes identified. Tracheostomy tube is present. Trachea  and esophagus within normal limits. No significant findings within the thyroid gland. Lungs/Pleura: Small bilateral pleural effusions. Loculated pleural fluid accumulation within the minor fissure on the right and within the oblique fissure on the left. Patchy bibasilar airspace opacities. Diffuse interlobular septal thickening with ground-glass attenuation throughout both lung fields. No pneumothorax. Upper Abdomen: Although only partially visualized at the edge of the field of view. Patient's percutaneous gastrostomy tube appears malpositioned with balloon appearing extraluminal positioned anterior to the left hepatic lobe (series 5, image 97). Musculoskeletal: No acute osseous abnormality.  No chest wall mass. Review of the MIP images confirms the above findings. IMPRESSION: 1. Negative for acute pulmonary embolism. 2. Findings suggestive of pulmonary edema with small bilateral pleural effusions. Loculated pleural fluid accumulation within the minor fissure on the right and within the oblique fissure on the left. 3. Patchy bibasilar airspace opacities may represent edema, atelectasis, or pneumonia. 4. Patient's percutaneous gastrostomy tube appears malpositioned although is incompletely visualized at the edge of the field of view. CT of the abdomen could be obtained to further evaluate. 5. Mildly prominent mediastinal lymph nodes, likely reactive. 6. Aortic and coronary artery atherosclerosis  (ICD10-I70.0). Electronically Signed   By: Davina Poke D.O.   On: 04/15/2020 15:00   US RENAL  Result Date: 04/18/2020 CLINICAL DATA:  Acute renal injury. EXAM: RENAL / URINARY TRACT ULTRASOUND COMPLETE COMPARISON:  CT abdomen pelvis 04/15/2020 FINDINGS: Right Kidney: Renal measurements: 10.5 x 3.9 x 4.5 cm = volume: 96 mL. Echogenicity within normal limits. No mass or hydronephrosis visualized. Left Kidney: Renal measurements: 9.3 x 4.8 x 4.1 cm = volume: 94 mL. Echogenicity within normal limits. No mass or hydronephrosis visualized. Bladder: Appears normal for degree of bladder distention. Other: Gallstones noted within the gallbladder lumen. At least trace simple free fluid noted within the pelvis. IMPRESSION: 1. Unremarkable renal ultrasound. 2. Cholelithiasis. 3. At least trace simple free fluid within the pelvis. Electronically Signed   By: Iven Finn M.D.   On: 04/18/2020 23:04   DG CHEST PORT 1 VIEW  Result Date: 05/06/2020 CLINICAL DATA:  Central line placement EXAM: PORTABLE CHEST 1 VIEW COMPARISON:  May 05, 2020 FINDINGS: There is a new right central line with the distal tip in the central SVC. No pneumothorax. Stable tracheostomy tube. The cardiomediastinal silhouette is stable. Possible mild pulmonary venous congestion. Patchy infiltrate in the right lung, worsened in the interval. IMPRESSION: 1. Support apparatus as above. 2. New patchy pulmonary infiltrate on the right worrisome for pneumonia or aspiration. Recommend clinical correlation. 3. Possible mild pulmonary venous congestion. Electronically Signed   By: Dorise Bullion III M.D   On: 05/06/2020 15:04   DG CHEST PORT 1 VIEW  Result Date: 05/05/2020 CLINICAL DATA:  Respiratory failure, history of CVA EXAM: PORTABLE CHEST 1 VIEW COMPARISON:  04/21/2020 chest radiograph. FINDINGS: Tracheostomy tube tip overlies the tracheal air column at the thoracic inlet. Stable cardiomediastinal silhouette with top-normal heart size. No  pneumothorax. Patient's right upper extremity obscures the lateral right lung base. No significant pleural effusions. Similar hazy bibasilar lung opacities. No overt pulmonary edema. IMPRESSION: 1. Similar hazy bibasilar lung opacities, favor atelectasis, difficult to exclude a component of aspiration or pneumonia. 2. Tracheostomy tube appears well-positioned. Electronically Signed   By: Ilona Sorrel M.D.   On: 05/05/2020 08:09   DG Chest Port 1 View  Result Date: 04/21/2020 CLINICAL DATA:  Respiratory failure EXAM: PORTABLE CHEST 1 VIEW COMPARISON:  CT 04/15/2020, radiograph 04/20/2020 FINDINGS: Endotracheal tube tip terminates in  the mid trachea, 5 cm from the carina. Telemetry leads and external support devices overlie the chest. The patient's right hand overlies the right lung base as well. Diffuse heterogeneous opacities again seen throughout both lungs, with some interval clearing particularly in the left mid lung. No pneumothorax. Suspect layering left effusion. No visible right effusion. Additional bandlike opacities favoring subsegmental atelectasis. Stable cardiomediastinal contours with a calcified aorta. No acute osseous or soft tissue abnormality. IMPRESSION: Endotracheal tube tip terminates in the mid trachea, 5 cm from the carina. Improving bilateral airspace opacities may reflect some resolving edema or infection. Layering left effusion. Electronically Signed   By: Lovena Le M.D.   On: 04/21/2020 05:34   DG Chest Port 1 View  Result Date: 04/20/2020 CLINICAL DATA:  Acute respiratory distress EXAM: PORTABLE CHEST 1 VIEW COMPARISON:  04/19/2020 FINDINGS: Single frontal view of the chest demonstrates stable tracheostomy tube. Cardiac silhouette remains enlarged. Widespread interstitial and ground-glass opacities are unchanged since the prior study. Trace bilateral pleural effusions are noted. No pneumothorax. IMPRESSION: 1. Stable widespread interstitial and ground-glass opacities consistent  with edema or pneumonia. 2. Stable trace bilateral pleural effusions. Electronically Signed   By: Randa Ngo M.D.   On: 04/20/2020 02:44   DG CHEST PORT 1 VIEW  Result Date: 04/17/2020 CLINICAL DATA:  Pneumonia. EXAM: PORTABLE CHEST 1 VIEW COMPARISON:  April 15, 2020. FINDINGS: Stable cardiomediastinal silhouette. Tracheostomy tube is in good position. No pneumothorax is noted. Small loculated left pleural effusion is noted. Stable bilateral lung opacities are noted concerning for pulmonary edema. Bony thorax is unremarkable. IMPRESSION: Stable bilateral lung opacities are noted concerning for pulmonary edema. Small loculated left pleural effusion is noted. Aortic Atherosclerosis (ICD10-I70.0). Electronically Signed   By: Marijo Conception M.D.   On: 04/17/2020 08:27   DG Chest Port 1 View  Result Date: 04/15/2020 CLINICAL DATA:  Tachycardia and wheezing EXAM: PORTABLE CHEST 1 VIEW COMPARISON:  November 06, 2010. FINDINGS: Tracheostomy catheter tip is 4.3 cm above the carina. No pneumothorax. There is apparent interstitial pulmonary edema with partially loculated small left pleural effusion. Ill-defined opacity noted in left base. There is cardiomegaly with pulmonary venous hypertension. No adenopathy. There is aortic atherosclerosis. No bone lesions. IMPRESSION: Tracheostomy as described without evident pneumothorax. Cardiomegaly with a degree of pulmonary vascular congestion. Evidence of interstitial pulmonary edema with small loculated pleural effusion. The appearance is suspicious for a degree of underlying congestive heart failure. Focal airspace opacity in the left base is concerning for focus of pneumonia, likely superimposed on a degree of interstitial pulmonary edema. Aortic Atherosclerosis (ICD10-I70.0). Electronically Signed   By: Lowella Grip III M.D.   On: 04/15/2020 13:55   DG Abd Portable 1V  Result Date: 05/09/2020 CLINICAL DATA:  Adynamic ileus. EXAM: PORTABLE ABDOMEN - 1 VIEW  COMPARISON:  05/08/2020.  05/06/2020. FINDINGS: NG tube noted with its tip in stable position in the stomach. No bowel distention. No free air. Mild residual contrast in the right colon. Aortoiliac and visceral atherosclerotic vascular calcification. Destructive changes again noted about the right hip. IMPRESSION: 1. NG tube noted with tip over the stomach in stable position. No bowel distention. 2.  Aortoiliac and visceral atherosclerotic vascular disease. 3.  Destructive changes again noted about the right hip. Electronically Signed   By: Marcello Moores  Register   On: 05/09/2020 05:06   DG Abd Portable 1V  Result Date: 05/08/2020 CLINICAL DATA:  Advanced nasogastric tube, evaluate position EXAM: PORTABLE ABDOMEN - 1 VIEW COMPARISON:  Abdominal radiograph  obtained earlier today FINDINGS: The gastric tube has advanced. The tip now overlies the gastric antrum in good position. Right IJ non tunneled hemodialysis catheter with the tip overlying the mid SVC. Tracheostomy tube is present with the tip midline and at the level of the clavicles. Stable cardiomegaly. Atherosclerotic calcification present in the transverse aorta. Marked pulmonary vascular congestion with mild interstitial edema. Unremarkable bowel gas pattern. IMPRESSION: 1. Well-positioned NG tube with the tip overlying the gastric antrum. 2. Right IJ non tunneled hemodialysis catheter with the tip at the mid SVC. 3. Well-positioned tracheostomy tube. 4. Stable cardiomegaly with pulmonary vascular congestion and mild interstitial edema. 5. Aortic atherosclerosis. Electronically Signed   By: Jacqulynn Cadet M.D.   On: 05/08/2020 07:55   DG Abd Portable 1V  Result Date: 05/08/2020 CLINICAL DATA:  Ileus. EXAM: PORTABLE ABDOMEN - 1 VIEW COMPARISON:  05/06/2020. FINDINGS: NG tube tip noted in the upper most portion of the stomach. Advancement of approximately 10 cm should be considered. Multiple air-filled loops of small and large bowel noted suggesting adynamic  ileus. No prominent bowel distention. No free air identified. Hemidiaphragms incompletely imaged. Aortoiliac and visceral atherosclerotic vascular calcification. Radiopacity over the right lower quadrant may represent residual barium. Degenerative change lumbar spine and both hips. IMPRESSION: 1. NG tube tip noted in the upper most portion stomach. Advancement of approximately 10 cm should be considered. 2. Multiple air-filled loops of small and large bowel noted suggesting adynamic ileus. No prominent bowel distention. 3.  Aortoiliac and visceral atherosclerotic vascular disease. Electronically Signed   By: Marcello Moores  Register   On: 05/08/2020 05:42   ECHOCARDIOGRAM COMPLETE  Result Date: 04/16/2020    ECHOCARDIOGRAM REPORT   Patient Name:   SHADIA LAROSE Date of Exam: 04/16/2020 Medical Rec #:  [id number]   Height:       67.0 in Accession #:    [id number]  Weight:       173.3 lb Date of Birth:  09/15/1940   BSA:          1.903 m Patient Age:    87 years    BP:           122/77 mmHg Patient Gender: F           HR:           72 bpm. Exam Location:  Inpatient Procedure: 2D Echo, Cardiac Doppler and Color Doppler Indications:    I48.2 Chronic atrial fibrillation; I48.91* Unspeicified atrial                 fibrillation  History:        Patient has no prior history of Echocardiogram examinations.                 Stroke; Risk Factors:Diabetes.  Sonographer:    Bernadene Person RDCS Referring Phys: [id number] [facility], [place] A CHANDRASEKHAR IMPRESSIONS  1. Left ventricular ejection fraction, by estimation, is 55 to 60%. The left ventricle has normal function. The left ventricle has no regional wall motion abnormalities. Left ventricular diastolic function could not be evaluated.  2. Right ventricular systolic function is normal. The right ventricular size is normal. There is moderately elevated pulmonary artery systolic pressure.  3. Left atrial size was mildly dilated.  4. The mitral valve is grossly normal. Mild to moderate mitral  valve regurgitation.  5. Tricuspid valve regurgitation is moderate.  6. The aortic valve is calcified. There is mild calcification of the aortic valve. There is mild thickening of the aortic  valve. Aortic valve regurgitation is mild.  7. Aortic dilatation noted. There is mild dilatation of the ascending aorta, measuring 39 mm. FINDINGS  Left Ventricle: Left ventricular ejection fraction, by estimation, is 55 to 60%. The left ventricle has normal function. The left ventricle has no regional wall motion abnormalities. The left ventricular internal cavity size was normal in size. There is  no left ventricular hypertrophy. Left ventricular diastolic function could not be evaluated due to atrial fibrillation. Left ventricular diastolic function could not be evaluated. Right Ventricle: The right ventricular size is normal. No increase in right ventricular wall thickness. Right ventricular systolic function is normal. There is moderately elevated pulmonary artery systolic pressure. The tricuspid regurgitant velocity is 3.60 m/s, and with an assumed right atrial pressure of 8 mmHg, the estimated right ventricular systolic pressure is 57.3 mmHg. Left Atrium: Left atrial size was mildly dilated. Right Atrium: Right atrial size was normal in size. Pericardium: There is no evidence of pericardial effusion. Mitral Valve: The mitral valve is grossly normal. There is moderate thickening of the mitral valve leaflet(s). There is mild calcification of the mitral valve leaflet(s). Mild to moderate mitral annular calcification. Mild to moderate mitral valve regurgitation. Tricuspid Valve: The tricuspid valve is normal in structure. Tricuspid valve regurgitation is moderate. Aortic Valve: The aortic valve is calcified. There is mild calcification of the aortic valve. There is mild thickening of the aortic valve. Aortic valve regurgitation is mild. Aortic regurgitation PHT measures 455 msec. Pulmonic Valve: The pulmonic valve was normal  in structure. Pulmonic valve regurgitation is not visualized. Aorta: Aortic dilatation noted. There is mild dilatation of the ascending aorta, measuring 39 mm. IAS/Shunts: The atrial septum is grossly normal.  LEFT VENTRICLE PLAX 2D LVIDd:         5.10 cm LVIDs:         3.90 cm LV PW:         0.90 cm LV IVS:        1.00 cm LVOT diam:     2.00 cm LV SV:         49 LV SV Index:   26 LVOT Area:     3.14 cm  LV Volumes (MOD) LV vol d, MOD A2C: 96.4 ml LV vol d, MOD A4C: 84.5 ml LV vol s, MOD A2C: 39.2 ml LV vol s, MOD A4C: 40.4 ml LV SV MOD A2C:     57.2 ml LV SV MOD A4C:     84.5 ml LV SV MOD BP:      52.9 ml RIGHT VENTRICLE RV S prime:     6.94 cm/s TAPSE (M-mode): 1.1 cm LEFT ATRIUM             Index       RIGHT ATRIUM           Index LA diam:        3.70 cm 1.94 cm/m  RA Area:     17.00 cm LA Vol (A2C):   52.0 ml 27.33 ml/m RA Volume:   46.10 ml  24.23 ml/m LA Vol (A4C):   68.7 ml 36.11 ml/m LA Biplane Vol: 51.3 ml 26.96 ml/m  AORTIC VALVE LVOT Vmax:   93.38 cm/s LVOT Vmean:  63.350 cm/s LVOT VTI:    0.156 m AI PHT:      455 msec  AORTA Ao Root diam: 3.30 cm Ao Asc diam:  3.90 cm MR Peak grad:    89.1 mmHg   TRICUSPID VALVE MR Mean  grad:    61.0 mmHg   TR Peak grad:   51.8 mmHg MR Vmax:         472.00 cm/s TR Vmax:        360.00 cm/s MR Vmean:        370.0 cm/s MR PISA:         0.57 cm    SHUNTS MR PISA Eff ROA: 5 mm       Systemic VTI:  0.16 m MR PISA Radius:  0.30 cm     Systemic Diam: 2.00 cm Mertie Moores MD Electronically signed by Mertie Moores MD Signature Date/Time: 04/16/2020/4:09:55 PM    Final     Lab Data:  CBC: Recent Labs  Lab 05/07/20 0444 05/08/20 0406 05/08/20 1301 05/09/20 0344 05/09/20 0612 05/09/20 1238 05/10/20 0307 05/11/20 0446  WBC 11.0* 10.6*  --  9.9  --   --  12.2* 12.4*  NEUTROABS 8.9* 9.2*  --  7.3  --   --  9.1* 10.1*  HGB 8.9* 7.0*   < > 7.0* 6.9* 7.9* 7.4* 6.8*  HCT 28.3* 22.3*   < > 21.2* 21.3* 24.8* 22.9* 22.1*  MCV 93.7 96.1  --  93.0  --   --  94.2 96.5   PLT 270 229  --  171  --   --  158 135*   < > = values in this interval not displayed.   Basic Metabolic Panel: Recent Labs  Lab 05/08/20 0406 05/09/20 0344 05/09/20 1524 05/10/20 0307 05/11/20 0448  NA 139 140 140 138 140  K 4.1 4.3 4.4 3.6 3.5  CL 100 102 103 102 102  CO2 _0 GLUCOSE 133* 152* 152* 150* 167*  BUN 132* 162* 173* 118* 123*  CREATININE 3.46* 3.95* 4.02* 3.09* 3.49*  CALCIUM 9.3 9.1 9.1 8.7* 8.7*  MG  --  2.1  --  2.0  --   PHOS 5.9* 7.3* 7.3* 5.3* 5.8*   GFR: Estimated Creatinine Clearance: 15.5 mL/min (A) (by C-G formula based on SCr of 3.49 mg/dL (H)). Liver Function Tests: Recent Labs  Lab 05/08/20 0406 05/09/20 0344 05/09/20 1524 05/10/20 0307 05/11/20 0448  ALBUMIN 2.9* 2.6* 2.7* 2.6* 2.4*   No results for input(s): LIPASE, AMYLASE in the last 168 hours. No results for input(s): AMMONIA in the last 168 hours. Coagulation Profile: No results for input(s): INR, PROTIME in the last 168 hours. Cardiac Enzymes: No results for input(s): CKTOTAL, CKMB, CKMBINDEX, TROPONINI in the last 168 hours. BNP (last 3 results) No results for input(s): PROBNP in the last 8760 hours. HbA1C: No results for input(s): HGBA1C in the last 72 hours. CBG: Recent Labs  Lab 05/10/20 1931 05/10/20 2324 05/11/20 0344 05/11/20 0714 05/11/20 1108  GLUCAP 137* 165* 145* 149* 135*   Lipid Profile: No results for input(s): CHOL, HDL, LDLCALC, TRIG, CHOLHDL, LDLDIRECT in the last 72 hours. Thyroid Function Tests: No results for input(s): TSH, T4TOTAL, FREET4, T3FREE, THYROIDAB in the last 72 hours. Anemia Panel: No results for input(s): VITAMINB12, FOLATE, FERRITIN, TIBC, IRON, RETICCTPCT in the last 72 hours. Urine analysis:    Component Value Date/Time   COLORURINE YELLOW 04/15/2020 1452   APPEARANCEUR HAZY (A) 04/15/2020 1452   LABSPEC 1.010 04/15/2020 1452   PHURINE 5.0 04/15/2020 1452   GLUCOSEU NEGATIVE 04/15/2020 1452   HGBUR MODERATE (A)  04/15/2020 1452   BILIRUBINUR NEGATIVE 04/15/2020 1452   [place] 04/15/2020 1452   PROTEINUR NEGATIVE 04/15/2020 1452   NITRITE  NEGATIVE 04/15/2020 1452   LEUKOCYTESUR LARGE (A) 04/15/2020 1452     Christino Mcglinchey M.D. [facility] 05/11/2020, 2:45 PM  Available via Epic secure chat 7am-7pm After 7 pm, please refer to night coverage provider listed on amion.

## 2020-05-12 DIAGNOSIS — I4891 Unspecified atrial fibrillation: Secondary | ICD-10-CM | POA: Diagnosis not present

## 2020-05-12 DIAGNOSIS — J9611 Chronic respiratory failure with hypoxia: Secondary | ICD-10-CM | POA: Diagnosis not present

## 2020-05-12 DIAGNOSIS — N179 Acute kidney failure, unspecified: Secondary | ICD-10-CM | POA: Diagnosis not present

## 2020-05-12 DIAGNOSIS — R0603 Acute respiratory distress: Secondary | ICD-10-CM | POA: Diagnosis not present

## 2020-05-12 DIAGNOSIS — Z7189 Other specified counseling: Secondary | ICD-10-CM | POA: Diagnosis not present

## 2020-05-12 DIAGNOSIS — J9621 Acute and chronic respiratory failure with hypoxia: Secondary | ICD-10-CM | POA: Diagnosis not present

## 2020-05-12 DIAGNOSIS — J810 Acute pulmonary edema: Secondary | ICD-10-CM | POA: Diagnosis not present

## 2020-05-12 LAB — RENAL FUNCTION PANEL
Albumin: 2.6 g/dL — ABNORMAL LOW (ref 3.5–5.0)
Anion gap: 9 (ref 5–15)
BUN: 72 mg/dL — ABNORMAL HIGH (ref 8–23)
CO2: 26 mmol/L (ref 22–32)
Calcium: 8.5 mg/dL — ABNORMAL LOW (ref 8.9–10.3)
Chloride: 100 mmol/L (ref 98–111)
Creatinine, Ser: 2.62 mg/dL — ABNORMAL HIGH (ref 0.44–1.00)
GFR, Estimated: 18 mL/min — ABNORMAL LOW (ref >60)
Glucose, Bld: 177 mg/dL — ABNORMAL HIGH (ref 70–99)
Phosphorus: 4.2 mg/dL (ref 2.5–4.6)
Potassium: 3.3 mmol/L — ABNORMAL LOW (ref 3.5–5.1)
Sodium: 135 mmol/L (ref 135–145)

## 2020-05-12 LAB — CBC WITH DIFFERENTIAL/PLATELET
Abs Immature Granulocytes: 0.08 10*3/uL — ABNORMAL HIGH (ref 0.00–0.07)
Basophils Absolute: 0 10*3/uL (ref 0.0–0.1)
Basophils Relative: 0 %
Eosinophils Absolute: 0.7 10*3/uL — ABNORMAL HIGH (ref 0.0–0.5)
Eosinophils Relative: 6 %
HCT: 25.4 % — ABNORMAL LOW (ref 36.0–46.0)
Hemoglobin: 8 g/dL — ABNORMAL LOW (ref 12.0–15.0)
Immature Granulocytes: 1 %
Lymphocytes Relative: 9 %
Lymphs Abs: 0.9 10*3/uL (ref 0.7–4.0)
MCH: 30.3 pg (ref 26.0–34.0)
MCHC: 31.5 g/dL (ref 30.0–36.0)
MCV: 96.2 fL (ref 80.0–100.0)
Monocytes Absolute: 0.6 10*3/uL (ref 0.1–1.0)
Monocytes Relative: 6 %
Neutro Abs: 8 10*3/uL — ABNORMAL HIGH (ref 1.7–7.7)
Neutrophils Relative %: 78 %
Platelets: 125 10*3/uL — ABNORMAL LOW (ref 150–400)
RBC: 2.64 MIL/uL — ABNORMAL LOW (ref 3.87–5.11)
RDW: 19.5 % — ABNORMAL HIGH (ref 11.5–15.5)
WBC: 10.3 10*3/uL (ref 4.0–10.5)
nRBC: 0.7 % — ABNORMAL HIGH (ref 0.0–0.2)

## 2020-05-12 LAB — TYPE AND SCREEN
ABO/RH(D): O POS
Antibody Screen: NEGATIVE
Unit division: 0

## 2020-05-12 LAB — BPAM RBC
Blood Product Expiration Date: 202204262359
ISSUE DATE / TIME: 202204081242
Unit Type and Rh: 5100

## 2020-05-12 LAB — GLUCOSE, CAPILLARY
Glucose-Capillary: 158 mg/dL — ABNORMAL HIGH (ref 70–99)
Glucose-Capillary: 141 mg/dL — ABNORMAL HIGH (ref 70–99)
Glucose-Capillary: 136 mg/dL — ABNORMAL HIGH (ref 70–99)
Glucose-Capillary: 140 mg/dL — ABNORMAL HIGH (ref 70–99)
Glucose-Capillary: 147 mg/dL — ABNORMAL HIGH (ref 70–99)
Glucose-Capillary: 127 mg/dL — ABNORMAL HIGH (ref 70–99)

## 2020-05-12 MED ORDER — LACTATED RINGERS IV BOLUS: 500.0000 mL | Freq: Once | INTRAVENOUS | Status: DC

## 2020-05-12 MED ORDER — SCOPOLAMINE 1 MG/3DAYS TD PT72
1.0000 | MEDICATED_PATCH | TRANSDERMAL | Status: DC
  Administered 2020-05-12: 1.5 mg via TRANSDERMAL
  Filled 2020-05-12: qty 1

## 2020-05-12 NOTE — Progress Notes (Signed)
[facility]                                                                              Patient Demographics  Rebecca Harrell, is a 80 y.o. female, DOB - 10-Jul-1940, UVO:[id number]  Admit date - 04/15/2020   Admitting Physician Vianne Bulls, MD  Outpatient Primary MD for the patient is Shackleford, Vicie Mutters, MD  Outpatient specialists:   LOS - 27  days   Medical records reviewed and are as summarized below:    Chief Complaint  Patient presents with  . Tachycardia       Brief summary   Patient is a 80 year old female with hypertension, type 2 diabetes mellitus, diastolic heart failure, seizures, CVA with right-sided hemiparesis, paroxysmal atrial fibrillation, chronic hypoxic respiratory failure status post tracheostomy, dysphagia status post PEG tube.  Patient is a [place] resident, transferred to acute care due to persistent tachycardia, refractory to intravenous metoprolol.  At the time of transfer, O2 sats mid 90s on 8 L supplemental O2, respiratory rate 30, HR 130, BP 86/45.  Patient was noted to have coarse breath sounds bilaterally, scattered rhonchi, irregularly irregular tachycardia.  Patient was diagnosed with A. fib with RVR, complicated with acute on chronic hypoxic respiratory failure, aspiration pneumonitis.  Patient was admitted to ICU, placed on broad-spectrum antibiotic therapy.  Bronch positive for Pseudomonas.  Patient was placed on anticoagulation with heparin drip, required 1 unit packed RBC transfusion.  Developed intermittent hypotension requiring vasopressors, transitioned to midodrine.  Tracheostomy change 3/22. Patient was transferred to [facility] service on 3/23. On 4/1, discussed with nephrology, Dr. Johnney Ou, ICU team, Dr. Elsworth Soho and ethics committee representative, Dr. Verlon Au.  Patient had nonoliguric severe AKI likely secondary to ATN.  Long-term prognosis guarded. On 4/2, patient was noted to be volume overloaded, trial for temporary  hemodialysis  On 4/3, patient had temporary HD cath placement by CCM, developed ileus, tube feeds placed on hold, NGT placed 4/5: Developed GI bleeding with bloody BMs 4/6: Goals of care discussion with patient's family  Assessment & Plan    Acute on chronic respiratory failure with hypoxia -Plan to return to [facility] where she can continue with invasive mechanical ventilation. -Trach management per CCM  AKI with ATN, nonoliguric -Likely worsening due to atrial fibrillation with RVR, IV contrast, recent infection, ruled out obstruction. -Nephrology following, recommended against long-term hemodialysis.  However family requested short-term trial to see if improves renal function. -Temporary HD cath placed on 4/3, per nephrology, last HD today 4/8 completes 1 week of HD trial.  However no meaningful recovery, overall multiorgan dysfunction and poor prognosis.  No further hemodialysis per nephrology.   Acute GI bleed, ABLA on anemia of chronic disease -Hemoglobin again down to 6.9 this morning, transfused 1 unit packed RBCs.  Received 1 unit on 05/08/2020 -Xarelto has been held since 4/2 for HD cath placement and has not been resumed. -Continue to hold Xarelto, reviewed GI recommendations, endoscopic procedure would be a very high risk due to her current condition, recommended comfort care. -Hemoglobin stable at 8.0  Ileus, acute -Improved, tolerating tube feeds  Paroxysmal atrial fibrillation with RVR -Oral amiodarone  was on hold due to ileus. -Continue to hold Xarelto, not a candidate for anticoagulation given GI bleed -Continue amiodarone  Hypotension with history of hypertension -Hypotensive earlier in a.m.  Diabetes mellitus type 2, IDDM -Continue sliding scale insulin  History of CVA, right-sided hemiparesis, dysphagia Continue tracheostomy, PEG tube.  -Continue IV Keppra   Pressure injury, -Stage II coccyx, POA  Severe sepsis/HCAP, septic shock -Status post  broad-spectrum antibiotics per CCM, off pressors  Goals of care Please see my note from 4/6 Discussions in progress with family regarding goals of care.  [place] meeting planned on 4/10 Overall no significant and meaningful recovery or improvement in the status, poor prognosis, hemodialysis has been discontinued.  Patient lethargic, having difficulty breathing this a.m. with hypotension, does not follow commands  Obesity Estimated body mass index is 30.66 kg/m as calculated from the following:   Height as of this encounter: '5\' 7"'  (1.702 m).   Weight as of this encounter: 88.8 kg.  Code Status: Full code DVT Prophylaxis:  Eliquis currently on hold due to ileus   Level of Care: Level of care: ICU Family Communication: Discussed with patient's son [name] face-to-face, discussed with patient's son [name] and daughter on 4/6, updated family by Dr. Domingo Cocking on 4/8, goals of care meeting planned on 4/10   Disposition Plan:     Status is: Inpatient  Remains inpatient appropriate because:Inpatient level of care appropriate due to severity of illness   Dispo: The patient is from: [facility]              Anticipated d/c is to: LTAC              Patient currently is not medically stable to d/c.  Goals of care planned meeting on 4/10   Difficult to place patient No      Time Spent in minutes 45 minutes  Procedures:    Consultants:   [facility]  Antimicrobials:   Anti-infectives (From admission, onward)   Start     Dose/Rate Route Frequency Ordered Stop   04/19/20 1115  ceFEPIme (MAXIPIME) 2 g in sodium chloride 0.9 % 100 mL IVPB        2 g 200 mL/hr over 30 Minutes Intravenous Every 24 hours 04/18/20 1503 04/20/20 1051   04/19/20 1102  ceFEPIme (MAXIPIME) 2 g in sodium chloride 0.9 % 100 mL IVPB  Status:  Discontinued        2 g 200 mL/hr over 30 Minutes Intravenous Every 24 hours 04/18/20 1501 04/18/20 1503   04/17/20 1100  linezolid (ZYVOX)  IVPB 600 mg        600 mg 300 mL/hr over 60 Minutes Intravenous Every 12 hours 04/17/20 1002 04/20/20 0032   04/16/20 1400  vancomycin (VANCOREADY) IVPB 1250 mg/250 mL  Status:  Discontinued        1,250 mg 166.7 mL/hr over 90 Minutes Intravenous Every 24 hours 04/15/20 1436 04/17/20 0813   04/16/20 0600  ceFEPIme (MAXIPIME) 2 g in sodium chloride 0.9 % 100 mL IVPB  Status:  Discontinued        2 g 200 mL/hr over 30 Minutes Intravenous Every 12 hours 04/15/20 2350 04/18/20 1501   04/16/20 0000  azithromycin (ZITHROMAX) 500 mg in sodium chloride 0.9 % 250 mL IVPB        500 mg 250 mL/hr over 60 Minutes Intravenous Every 24 hours 04/15/20 2350 04/20/20 0100   04/15/20 1700  piperacillin-tazobactam (ZOSYN) IVPB 3.375 g  3.375 g 12.5 mL/hr over 240 Minutes Intravenous  Once 04/15/20 1646 04/15/20 2101   04/15/20 1315  vancomycin (VANCOREADY) IVPB 1500 mg/300 mL        1,500 mg 150 mL/hr over 120 Minutes Intravenous  Once 04/15/20 1305 04/15/20 1617         Medications  Scheduled Meds: . amiodarone  200 mg Per Tube Daily  . chlorhexidine gluconate (MEDLINE KIT)  15 mL Mouth Rinse BID  . Chlorhexidine Gluconate Cloth  6 each Topical Daily  . darbepoetin (ARANESP) injection - DIALYSIS  100 mcg Intravenous Q Wed-HD  . guaiFENesin  5 mL Per Tube Q12H  . insulin aspart  0-15 Units Subcutaneous Q4H  . mouth rinse  15 mL Mouth Rinse 10 times per day  . midodrine  5 mg Per Tube Q8H  . pantoprazole (PROTONIX) IV  40 mg Intravenous Q12H  . QUEtiapine  25 mg Per Tube QHS  . scopolamine  1 patch Transdermal Q72H   Continuous Infusions: . sodium chloride Stopped (05/05/20 1003)  . sodium chloride    . sodium chloride    . sodium chloride 10 mL/hr at 05/12/20 1100  . amiodarone Stopped (05/10/20 1242)  . feeding supplement (VITAL AF 1.2 CAL) 1,000 mL (05/12/20 2956)  . lactated ringers    . levETIRAcetam Stopped (05/11/20 2309)   PRN Meds:.sodium chloride, sodium chloride, sodium  chloride, acetaminophen, alteplase, dextrose, fentaNYL (SUBLIMAZE) injection, heparin, ipratropium-albuterol, lidocaine (PF), lidocaine-prilocaine, metoprolol tartrate, ondansetron (ZOFRAN) IV, oxyCODONE, pentafluoroprop-tetrafluoroeth      Subjective:   Rebecca Harrell was seen and examined today.  Chronically ill-appearing, frail, BP soft.  Shallow breathing this a.m.  Objective:   Vitals:   05/12/20 1000 05/12/20 1030 05/12/20 1100 05/12/20 1130  BP: (!) 93/19 (!) 107/17 (!) 73/23 121/74  Pulse: (!) 106 (!) 104 (!) 104 (!) 102  Resp: (!) 25 (!) 24 (!) 24 (!) 23  Temp:  98.1 F (36.7 C)    TempSrc:  Axillary    SpO2: 97% 97% 97% 96%  Weight:      Height:        Intake/Output Summary (Last 24 hours) at 05/12/2020 1203 Last data filed at 05/12/2020 1100 Gross per 24 hour  Intake 2220.12 ml  Output 2300 ml  Net -79.88 ml     Wt Readings from Last 3 Encounters:  05/11/20 88.8 kg   Physical Exam  General: Ill-appearing, on vent, trach, nonverbal  Cardiovascular: S1 S2 clear, tachycardia  Respiratory: Coarse breath sounds throughout  Gastrointestinal: Soft, type II  Ext: anasarca, right AKA, contractures  Neuro: does not follow commands  Musculoskeletal: No cyanosis, clubbing  Skin: No rashes  Psych: does not follow commands     Data Reviewed:  I have personally reviewed following labs and imaging studies  Micro Results No results found for this or any previous visit (from the past 240 hour(s)).  Radiology Reports CT ABDOMEN WO CONTRAST  Result Date: 04/15/2020 CLINICAL DATA:  Possible G-tube malpositioning EXAM: CT ABDOMEN WITHOUT CONTRAST TECHNIQUE: Multidetector CT imaging of the abdomen was performed following the standard protocol without IV contrast. COMPARISON:  CT chest same day FINDINGS: Lower chest: Cardiomegaly. Coronary artery calcifications. Small bilateral pleural effusions with patchy bibasilar opacities. Hepatobiliary: Unremarkable unenhanced  appearance of the liver. No focal liver lesion identified. Gallbladder is decompressed. No hyperdense gallstone. No biliary dilatation. Pancreas: Unremarkable. No pancreatic ductal dilatation or surrounding inflammatory changes. Spleen: Grossly unremarkable. Adrenals/Urinary Tract: No definite adrenal nodule. Kidneys appear within normal limits.  Excreted contrast within the bilateral renal collecting systems. No hydronephrosis. Stomach/Bowel: Percutaneous gastrostomy tube is appropriately positioned with balloon located in the anterior aspect of the distal gastric body. No evidence of outlet obstruction. Visualized bowel loops within the upper abdomen are within normal limits. No evidence of obstruction or inflammation. Vascular/Lymphatic: Extensive atherosclerosis throughout the aortoiliac axis. No upper abdominal lymphadenopathy. Other: No ascites or pneumoperitoneum. Musculoskeletal: No acute findings. IMPRESSION: 1. Percutaneous gastrostomy tube is appropriately positioned with balloon located intraluminally in the anterior aspect of the distal gastric body. No evidence of outlet obstruction. 2. No acute findings within the abdomen. 3. Small bilateral pleural effusions with patchy bibasilar opacities. Aortic Atherosclerosis (ICD10-I70.0). Electronically Signed   By: Davina Poke D.O.   On: 04/15/2020 16:16   DG Chest 2 View  Result Date: 04/19/2020 CLINICAL DATA:  Dyspnea, diabetes mellitus EXAM: CHEST - 2 VIEW COMPARISON:  Portable exam 1504 hours compared to 04/17/2020 FINDINGS: Tracheostomy tube unchanged. Enlargement of cardiac silhouette. Atherosclerotic calcifications aorta. Extensive BILATERAL pulmonary infiltrates, slightly increased. This could represent pulmonary edema or multifocal pneumonia. Small LEFT pleural effusion. No pneumothorax or acute osseous findings. IMPRESSION: BILATERAL pulmonary infiltrates question pulmonary edema versus multifocal pneumonia. Enlargement of cardiac silhouette  with small LEFT pleural effusion. Electronically Signed   By: Lavonia Dana M.D.   On: 04/19/2020 16:40   DG Abd 1 View  Result Date: 05/06/2020 CLINICAL DATA:  NG tube placement. EXAM: ABDOMEN - 1 VIEW COMPARISON:  KUB and chest x-ray earlier today. FINDINGS: Interval placement of nasogastric tube with tip and side-port over the stomach in the left upper quadrant. Visualized bowel gas pattern is nonobstructive. Remainder of the exam is unchanged. IMPRESSION: Nasogastric tube with tip and side-port over the stomach in the left upper quadrant. Electronically Signed   By: Marin Olp M.D.   On: 05/06/2020 16:53   DG Abd 1 View  Result Date: 05/06/2020 CLINICAL DATA:  Ileus. EXAM: ABDOMEN - 1 VIEW COMPARISON:  November 06, 2010 FINDINGS: The lung bases were not imaged. Air-filled large and small bowel suggest ileus. Early or partial small bowel obstruction not completely excluded as there is no gas in the region of the rectum. No free air, portal venous gas, or pneumatosis. The right hip is abnormal and there is either bony erosion or a fracture. IMPRESSION: 1. Air-filled prominent loops of large and small bowel favored to represent ileus given history. Early or partial small bowel obstruction not completely excluded. Recommend close attention on follow-up. 2. Abnormal right hip with either a comminuted fracture or bony erosion, incompletely evaluated. Recommend clinical correlation and dedicated imaging. Electronically Signed   By: Dorise Bullion III M.D   On: 05/06/2020 15:07   CT Angio Chest PE W and/or Wo Contrast  Result Date: 04/15/2020 CLINICAL DATA:  Tachycardia EXAM: CT ANGIOGRAPHY CHEST WITH CONTRAST TECHNIQUE: Multidetector CT imaging of the chest was performed using the standard protocol during bolus administration of intravenous contrast. Multiplanar CT image reconstructions and MIPs were obtained to evaluate the vascular anatomy. CONTRAST:  60m OMNIPAQUE IOHEXOL 350 MG/ML SOLN COMPARISON:   04/15/2020 FINDINGS: Cardiovascular: Satisfactory opacification of the pulmonary arteries to the segmental level. No evidence of pulmonary embolism. Thoracic aorta is nonaneurysmal. Common origin of the brachiocephalic and left common carotid arteries. Tortuosity of the descending thoracic aorta. Atherosclerotic calcifications of the aorta and coronary arteries. Heart size is mildly enlarged. No pericardial effusion. Mediastinum/Nodes: Mildly prominent mediastinal lymph nodes including 12 mm precarinal node (series 5, image 37). No enlarged  axillary or hilar lymph nodes identified. Tracheostomy tube is present. Trachea and esophagus within normal limits. No significant findings within the thyroid gland. Lungs/Pleura: Small bilateral pleural effusions. Loculated pleural fluid accumulation within the minor fissure on the right and within the oblique fissure on the left. Patchy bibasilar airspace opacities. Diffuse interlobular septal thickening with ground-glass attenuation throughout both lung fields. No pneumothorax. Upper Abdomen: Although only partially visualized at the edge of the field of view. Patient's percutaneous gastrostomy tube appears malpositioned with balloon appearing extraluminal positioned anterior to the left hepatic lobe (series 5, image 97). Musculoskeletal: No acute osseous abnormality.  No chest wall mass. Review of the MIP images confirms the above findings. IMPRESSION: 1. Negative for acute pulmonary embolism. 2. Findings suggestive of pulmonary edema with small bilateral pleural effusions. Loculated pleural fluid accumulation within the minor fissure on the right and within the oblique fissure on the left. 3. Patchy bibasilar airspace opacities may represent edema, atelectasis, or pneumonia. 4. Patient's percutaneous gastrostomy tube appears malpositioned although is incompletely visualized at the edge of the field of view. CT of the abdomen could be obtained to further evaluate. 5. Mildly  prominent mediastinal lymph nodes, likely reactive. 6. Aortic and coronary artery atherosclerosis (ICD10-I70.0). Electronically Signed   By: Davina Poke D.O.   On: 04/15/2020 15:00   US RENAL  Result Date: 04/18/2020 CLINICAL DATA:  Acute renal injury. EXAM: RENAL / URINARY TRACT ULTRASOUND COMPLETE COMPARISON:  CT abdomen pelvis 04/15/2020 FINDINGS: Right Kidney: Renal measurements: 10.5 x 3.9 x 4.5 cm = volume: 96 mL. Echogenicity within normal limits. No mass or hydronephrosis visualized. Left Kidney: Renal measurements: 9.3 x 4.8 x 4.1 cm = volume: 94 mL. Echogenicity within normal limits. No mass or hydronephrosis visualized. Bladder: Appears normal for degree of bladder distention. Other: Gallstones noted within the gallbladder lumen. At least trace simple free fluid noted within the pelvis. IMPRESSION: 1. Unremarkable renal ultrasound. 2. Cholelithiasis. 3. At least trace simple free fluid within the pelvis. Electronically Signed   By: Iven Finn M.D.   On: 04/18/2020 23:04   DG CHEST PORT 1 VIEW  Result Date: 05/06/2020 CLINICAL DATA:  Central line placement EXAM: PORTABLE CHEST 1 VIEW COMPARISON:  May 05, 2020 FINDINGS: There is a new right central line with the distal tip in the central SVC. No pneumothorax. Stable tracheostomy tube. The cardiomediastinal silhouette is stable. Possible mild pulmonary venous congestion. Patchy infiltrate in the right lung, worsened in the interval. IMPRESSION: 1. Support apparatus as above. 2. New patchy pulmonary infiltrate on the right worrisome for pneumonia or aspiration. Recommend clinical correlation. 3. Possible mild pulmonary venous congestion. Electronically Signed   By: Dorise Bullion III M.D   On: 05/06/2020 15:04   DG CHEST PORT 1 VIEW  Result Date: 05/05/2020 CLINICAL DATA:  Respiratory failure, history of CVA EXAM: PORTABLE CHEST 1 VIEW COMPARISON:  04/21/2020 chest radiograph. FINDINGS: Tracheostomy tube tip overlies the tracheal air  column at the thoracic inlet. Stable cardiomediastinal silhouette with top-normal heart size. No pneumothorax. Patient's right upper extremity obscures the lateral right lung base. No significant pleural effusions. Similar hazy bibasilar lung opacities. No overt pulmonary edema. IMPRESSION: 1. Similar hazy bibasilar lung opacities, favor atelectasis, difficult to exclude a component of aspiration or pneumonia. 2. Tracheostomy tube appears well-positioned. Electronically Signed   By: Ilona Sorrel M.D.   On: 05/05/2020 08:09   DG Chest Port 1 View  Result Date: 04/21/2020 CLINICAL DATA:  Respiratory failure EXAM: PORTABLE CHEST 1 VIEW COMPARISON:  CT 04/15/2020, radiograph 04/20/2020 FINDINGS: Endotracheal tube tip terminates in the mid trachea, 5 cm from the carina. Telemetry leads and external support devices overlie the chest. The patient's right hand overlies the right lung base as well. Diffuse heterogeneous opacities again seen throughout both lungs, with some interval clearing particularly in the left mid lung. No pneumothorax. Suspect layering left effusion. No visible right effusion. Additional bandlike opacities favoring subsegmental atelectasis. Stable cardiomediastinal contours with a calcified aorta. No acute osseous or soft tissue abnormality. IMPRESSION: Endotracheal tube tip terminates in the mid trachea, 5 cm from the carina. Improving bilateral airspace opacities may reflect some resolving edema or infection. Layering left effusion. Electronically Signed   By: Lovena Le M.D.   On: 04/21/2020 05:34   DG Chest Port 1 View  Result Date: 04/20/2020 CLINICAL DATA:  Acute respiratory distress EXAM: PORTABLE CHEST 1 VIEW COMPARISON:  04/19/2020 FINDINGS: Single frontal view of the chest demonstrates stable tracheostomy tube. Cardiac silhouette remains enlarged. Widespread interstitial and ground-glass opacities are unchanged since the prior study. Trace bilateral pleural effusions are noted. No  pneumothorax. IMPRESSION: 1. Stable widespread interstitial and ground-glass opacities consistent with edema or pneumonia. 2. Stable trace bilateral pleural effusions. Electronically Signed   By: Randa Ngo M.D.   On: 04/20/2020 02:44   DG CHEST PORT 1 VIEW  Result Date: 04/17/2020 CLINICAL DATA:  Pneumonia. EXAM: PORTABLE CHEST 1 VIEW COMPARISON:  April 15, 2020. FINDINGS: Stable cardiomediastinal silhouette. Tracheostomy tube is in good position. No pneumothorax is noted. Small loculated left pleural effusion is noted. Stable bilateral lung opacities are noted concerning for pulmonary edema. Bony thorax is unremarkable. IMPRESSION: Stable bilateral lung opacities are noted concerning for pulmonary edema. Small loculated left pleural effusion is noted. Aortic Atherosclerosis (ICD10-I70.0). Electronically Signed   By: Marijo Conception M.D.   On: 04/17/2020 08:27   DG Chest Port 1 View  Result Date: 04/15/2020 CLINICAL DATA:  Tachycardia and wheezing EXAM: PORTABLE CHEST 1 VIEW COMPARISON:  November 06, 2010. FINDINGS: Tracheostomy catheter tip is 4.3 cm above the carina. No pneumothorax. There is apparent interstitial pulmonary edema with partially loculated small left pleural effusion. Ill-defined opacity noted in left base. There is cardiomegaly with pulmonary venous hypertension. No adenopathy. There is aortic atherosclerosis. No bone lesions. IMPRESSION: Tracheostomy as described without evident pneumothorax. Cardiomegaly with a degree of pulmonary vascular congestion. Evidence of interstitial pulmonary edema with small loculated pleural effusion. The appearance is suspicious for a degree of underlying congestive heart failure. Focal airspace opacity in the left base is concerning for focus of pneumonia, likely superimposed on a degree of interstitial pulmonary edema. Aortic Atherosclerosis (ICD10-I70.0). Electronically Signed   By: Lowella Grip III M.D.   On: 04/15/2020 13:55   DG Abd Portable  1V  Result Date: 05/09/2020 CLINICAL DATA:  Adynamic ileus. EXAM: PORTABLE ABDOMEN - 1 VIEW COMPARISON:  05/08/2020.  05/06/2020. FINDINGS: NG tube noted with its tip in stable position in the stomach. No bowel distention. No free air. Mild residual contrast in the right colon. Aortoiliac and visceral atherosclerotic vascular calcification. Destructive changes again noted about the right hip. IMPRESSION: 1. NG tube noted with tip over the stomach in stable position. No bowel distention. 2.  Aortoiliac and visceral atherosclerotic vascular disease. 3.  Destructive changes again noted about the right hip. Electronically Signed   By: Marcello Moores  Register   On: 05/09/2020 05:06   DG Abd Portable 1V  Result Date: 05/08/2020 CLINICAL DATA:  Advanced nasogastric tube, evaluate position  EXAM: PORTABLE ABDOMEN - 1 VIEW COMPARISON:  Abdominal radiograph obtained earlier today FINDINGS: The gastric tube has advanced. The tip now overlies the gastric antrum in good position. Right IJ non tunneled hemodialysis catheter with the tip overlying the mid SVC. Tracheostomy tube is present with the tip midline and at the level of the clavicles. Stable cardiomegaly. Atherosclerotic calcification present in the transverse aorta. Marked pulmonary vascular congestion with mild interstitial edema. Unremarkable bowel gas pattern. IMPRESSION: 1. Well-positioned NG tube with the tip overlying the gastric antrum. 2. Right IJ non tunneled hemodialysis catheter with the tip at the mid SVC. 3. Well-positioned tracheostomy tube. 4. Stable cardiomegaly with pulmonary vascular congestion and mild interstitial edema. 5. Aortic atherosclerosis. Electronically Signed   By: Jacqulynn Cadet M.D.   On: 05/08/2020 07:55   DG Abd Portable 1V  Result Date: 05/08/2020 CLINICAL DATA:  Ileus. EXAM: PORTABLE ABDOMEN - 1 VIEW COMPARISON:  05/06/2020. FINDINGS: NG tube tip noted in the upper most portion of the stomach. Advancement of approximately 10 cm  should be considered. Multiple air-filled loops of small and large bowel noted suggesting adynamic ileus. No prominent bowel distention. No free air identified. Hemidiaphragms incompletely imaged. Aortoiliac and visceral atherosclerotic vascular calcification. Radiopacity over the right lower quadrant may represent residual barium. Degenerative change lumbar spine and both hips. IMPRESSION: 1. NG tube tip noted in the upper most portion stomach. Advancement of approximately 10 cm should be considered. 2. Multiple air-filled loops of small and large bowel noted suggesting adynamic ileus. No prominent bowel distention. 3.  Aortoiliac and visceral atherosclerotic vascular disease. Electronically Signed   By: Marcello Moores  Register   On: 05/08/2020 05:42   ECHOCARDIOGRAM COMPLETE  Result Date: 04/16/2020    ECHOCARDIOGRAM REPORT   Patient Name:   MYLI PAE Date of Exam: 04/16/2020 Medical Rec #:  [id number]   Height:       67.0 in Accession #:    [id number]  Weight:       173.3 lb Date of Birth:  [birth year]   BSA:          1.903 m Patient Age:    7 years    BP:           122/77 mmHg Patient Gender: F           HR:           72 bpm. Exam Location:  Inpatient Procedure: 2D Echo, Cardiac Doppler and Color Doppler Indications:    I48.2 Chronic atrial fibrillation; I48.91* Unspeicified atrial                 fibrillation  History:        Patient has no prior history of Echocardiogram examinations.                 Stroke; Risk Factors:Diabetes.  Sonographer:    Bernadene Person RDCS Referring Phys: [id number] [facility] A CHANDRASEKHAR IMPRESSIONS  1. Left ventricular ejection fraction, by estimation, is 55 to 60%. The left ventricle has normal function. The left ventricle has no regional wall motion abnormalities. Left ventricular diastolic function could not be evaluated.  2. Right ventricular systolic function is normal. The right ventricular size is normal. There is moderately elevated pulmonary artery systolic pressure.  3. Left  atrial size was mildly dilated.  4. The mitral valve is grossly normal. Mild to moderate mitral valve regurgitation.  5. Tricuspid valve regurgitation is moderate.  6. The aortic valve is calcified. There is mild calcification of  the aortic valve. There is mild thickening of the aortic valve. Aortic valve regurgitation is mild.  7. Aortic dilatation noted. There is mild dilatation of the ascending aorta, measuring 39 mm. FINDINGS  Left Ventricle: Left ventricular ejection fraction, by estimation, is 55 to 60%. The left ventricle has normal function. The left ventricle has no regional wall motion abnormalities. The left ventricular internal cavity size was normal in size. There is  no left ventricular hypertrophy. Left ventricular diastolic function could not be evaluated due to atrial fibrillation. Left ventricular diastolic function could not be evaluated. Right Ventricle: The right ventricular size is normal. No increase in right ventricular wall thickness. Right ventricular systolic function is normal. There is moderately elevated pulmonary artery systolic pressure. The tricuspid regurgitant velocity is 3.60 m/s, and with an assumed right atrial pressure of 8 mmHg, the estimated right ventricular systolic pressure is 16.8 mmHg. Left Atrium: Left atrial size was mildly dilated. Right Atrium: Right atrial size was normal in size. Pericardium: There is no evidence of pericardial effusion. Mitral Valve: The mitral valve is grossly normal. There is moderate thickening of the mitral valve leaflet(s). There is mild calcification of the mitral valve leaflet(s). Mild to moderate mitral annular calcification. Mild to moderate mitral valve regurgitation. Tricuspid Valve: The tricuspid valve is normal in structure. Tricuspid valve regurgitation is moderate. Aortic Valve: The aortic valve is calcified. There is mild calcification of the aortic valve. There is mild thickening of the aortic valve. Aortic valve regurgitation is  mild. Aortic regurgitation PHT measures 455 msec. Pulmonic Valve: The pulmonic valve was normal in structure. Pulmonic valve regurgitation is not visualized. Aorta: Aortic dilatation noted. There is mild dilatation of the ascending aorta, measuring 39 mm. IAS/Shunts: The atrial septum is grossly normal.  LEFT VENTRICLE PLAX 2D LVIDd:         5.10 cm LVIDs:         3.90 cm LV PW:         0.90 cm LV IVS:        1.00 cm LVOT diam:     2.00 cm LV SV:         49 LV SV Index:   26 LVOT Area:     3.14 cm  LV Volumes (MOD) LV vol d, MOD A2C: 96.4 ml LV vol d, MOD A4C: 84.5 ml LV vol s, MOD A2C: 39.2 ml LV vol s, MOD A4C: 40.4 ml LV SV MOD A2C:     57.2 ml LV SV MOD A4C:     84.5 ml LV SV MOD BP:      52.9 ml RIGHT VENTRICLE RV S prime:     6.94 cm/s TAPSE (M-mode): 1.1 cm LEFT ATRIUM             Index       RIGHT ATRIUM           Index LA diam:        3.70 cm 1.94 cm/m  RA Area:     17.00 cm LA Vol (A2C):   52.0 ml 27.33 ml/m RA Volume:   46.10 ml  24.23 ml/m LA Vol (A4C):   68.7 ml 36.11 ml/m LA Biplane Vol: 51.3 ml 26.96 ml/m  AORTIC VALVE LVOT Vmax:   93.38 cm/s LVOT Vmean:  63.350 cm/s LVOT VTI:    0.156 m AI PHT:      455 msec  AORTA Ao Root diam: 3.30 cm Ao Asc diam:  3.90 cm MR Peak grad:  89.1 mmHg   TRICUSPID VALVE MR Mean grad:    61.0 mmHg   TR Peak grad:   51.8 mmHg MR Vmax:         472.00 cm/s TR Vmax:        360.00 cm/s MR Vmean:        370.0 cm/s MR PISA:         0.57 cm    SHUNTS MR PISA Eff ROA: 5 mm       Systemic VTI:  0.16 m MR PISA Radius:  0.30 cm     Systemic Diam: 2.00 cm Mertie Moores MD Electronically signed by Mertie Moores MD Signature Date/Time: 04/16/2020/4:09:55 PM    Final     Lab Data:  CBC: Recent Labs  Lab 05/08/20 0406 05/08/20 1301 05/09/20 0344 05/09/20 0612 05/09/20 1238 05/10/20 0307 05/11/20 0446 05/12/20 0357  WBC 10.6*  --  9.9  --   --  12.2* 12.4* 10.3  NEUTROABS 9.2*  --  7.3  --   --  9.1* 10.1* 8.0*  HGB 7.0*   < > 7.0* 6.9* 7.9* 7.4* 6.8* 8.0*   HCT 22.3*   < > 21.2* 21.3* 24.8* 22.9* 22.1* 25.4*  MCV 96.1  --  93.0  --   --  94.2 96.5 96.2  PLT 229  --  171  --   --  158 135* 125*   < > = values in this interval not displayed.   Basic Metabolic Panel: Recent Labs  Lab 05/09/20 0344 05/09/20 1524 05/10/20 0307 05/11/20 0448 05/12/20 0357  NA 140 140 138 140 135  K 4.3 4.4 3.6 3.5 3.3*  CL 102 103 102 102 100  CO2 '22 23 24 24 26  ' GLUCOSE 152* 152* 150* 167* 177*  BUN 162* 173* 118* 123* 72*  CREATININE 3.95* 4.02* 3.09* 3.49* 2.62*  CALCIUM 9.1 9.1 8.7* 8.7* 8.5*  MG 2.1  --  2.0  --   --   PHOS 7.3* 7.3* 5.3* 5.8* 4.2   GFR: Estimated Creatinine Clearance: 20.6 mL/min (A) (by C-G formula based on SCr of 2.62 mg/dL (H)). Liver Function Tests: Recent Labs  Lab 05/09/20 0344 05/09/20 1524 05/10/20 0307 05/11/20 0448 05/12/20 0357  ALBUMIN 2.6* 2.7* 2.6* 2.4* 2.6*   No results for input(s): LIPASE, AMYLASE in the last 168 hours. No results for input(s): AMMONIA in the last 168 hours. Coagulation Profile: No results for input(s): INR, PROTIME in the last 168 hours. Cardiac Enzymes: No results for input(s): CKTOTAL, CKMB, CKMBINDEX, TROPONINI in the last 168 hours. BNP (last 3 results) No results for input(s): PROBNP in the last 8760 hours. HbA1C: No results for input(s): HGBA1C in the last 72 hours. CBG: Recent Labs  Lab 05/11/20 1914 05/11/20 2346 05/12/20 0320 05/12/20 0731 05/12/20 1054  GLUCAP 137* 172* 158* 141* 136*   Lipid Profile: No results for input(s): CHOL, HDL, LDLCALC, TRIG, CHOLHDL, LDLDIRECT in the last 72 hours. Thyroid Function Tests: No results for input(s): TSH, T4TOTAL, FREET4, T3FREE, THYROIDAB in the last 72 hours. Anemia Panel: No results for input(s): VITAMINB12, FOLATE, FERRITIN, TIBC, IRON, RETICCTPCT in the last 72 hours. Urine analysis:    Component Value Date/Time   COLORURINE YELLOW 04/15/2020 1452   APPEARANCEUR HAZY (A) 04/15/2020 1452   LABSPEC 1.010 04/15/2020  1452   PHURINE 5.0 04/15/2020 1452   GLUCOSEU NEGATIVE 04/15/2020 1452   HGBUR MODERATE (A) 04/15/2020 1452   BILIRUBINUR NEGATIVE 04/15/2020 1452   [place] 04/15/2020 1452  PROTEINUR NEGATIVE 04/15/2020 1452   NITRITE NEGATIVE 04/15/2020 1452   LEUKOCYTESUR LARGE (A) 04/15/2020 1452     Maylani Embree M.D. [facility] 05/12/2020, 12:03 PM  Available via Epic secure chat 7am-7pm After 7 pm, please refer to night coverage provider listed on amion.

## 2020-05-12 NOTE — Progress Notes (Signed)
Palliative Care Progress Note  Reason for consult: Goals of care  I saw and examined Ms. Szydlowski today.  She does not track or follow commands.  Discussed with bedside RN and Dr. Tana Coast.  She was having lower pressures earlier today but these do appear to be improved at this time.  No family is at the bedside.  We have a family meeting set up for tomorrow.  During last visit with daughter, I discussed the following: 1) She has multisystem failure, including renal failure.  Her renal function in not improving despite medical interventions. 2) She is not a candidate for long term dialysis and she has not had recovery of renal function to this point with short term dialysis trial.  Last session of dialysis today. 3) No plan to continue dialysis after today's session.  If her renal function does not improve following last dialysis session tomorrow, the expected outcome of renal failure in someone who is not a candidate for long term dialysis is death 4) Offering resuscitation at the time of an expected death with a non-reversible cause is non-beneficial and therefore not medically appropriate  We have a family meeting set up for tomorrow at 1530.  Total time: 20 minutes  Greater than 50%  of this time was spent counseling and coordinating care related to the above assessment and plan.  [name], MD [facility] [phone]

## 2020-05-12 NOTE — Progress Notes (Signed)
RT note- Patient is tolerating PCV with a pressure of 10, VT are 350-420m, continue to monitor. RN aware.

## 2020-05-12 NOTE — Progress Notes (Signed)
RT note-Called for patient with elevated PIP's and agitation, minimal secretions. Attempt to PC.

## 2020-05-13 DIAGNOSIS — J9621 Acute and chronic respiratory failure with hypoxia: Secondary | ICD-10-CM | POA: Diagnosis not present

## 2020-05-13 DIAGNOSIS — J810 Acute pulmonary edema: Secondary | ICD-10-CM | POA: Diagnosis not present

## 2020-05-13 DIAGNOSIS — I4891 Unspecified atrial fibrillation: Secondary | ICD-10-CM | POA: Diagnosis not present

## 2020-05-13 DIAGNOSIS — R0603 Acute respiratory distress: Secondary | ICD-10-CM | POA: Diagnosis not present

## 2020-05-13 LAB — RENAL FUNCTION PANEL
Albumin: 2.3 g/dL — ABNORMAL LOW (ref 3.5–5.0)
Anion gap: 12 (ref 5–15)
BUN: 85 mg/dL — ABNORMAL HIGH (ref 8–23)
CO2: 25 mmol/L (ref 22–32)
Calcium: 8.3 mg/dL — ABNORMAL LOW (ref 8.9–10.3)
Chloride: 100 mmol/L (ref 98–111)
Creatinine, Ser: 3.02 mg/dL — ABNORMAL HIGH (ref 0.44–1.00)
GFR, Estimated: 15 mL/min — ABNORMAL LOW (ref >60)
Glucose, Bld: 155 mg/dL — ABNORMAL HIGH (ref 70–99)
Phosphorus: 5.2 mg/dL — ABNORMAL HIGH (ref 2.5–4.6)
Potassium: 3.6 mmol/L (ref 3.5–5.1)
Sodium: 137 mmol/L (ref 135–145)

## 2020-05-13 LAB — CBC WITH DIFFERENTIAL/PLATELET
Abs Immature Granulocytes: 0.05 10*3/uL (ref 0.00–0.07)
Basophils Absolute: 0 10*3/uL (ref 0.0–0.1)
Basophils Relative: 0 %
Eosinophils Absolute: 0.6 10*3/uL — ABNORMAL HIGH (ref 0.0–0.5)
Eosinophils Relative: 7 %
HCT: 25.8 % — ABNORMAL LOW (ref 36.0–46.0)
Hemoglobin: 7.8 g/dL — ABNORMAL LOW (ref 12.0–15.0)
Immature Granulocytes: 1 %
Lymphocytes Relative: 9 %
Lymphs Abs: 0.7 10*3/uL (ref 0.7–4.0)
MCH: 29.7 pg (ref 26.0–34.0)
MCHC: 30.2 g/dL (ref 30.0–36.0)
MCV: 98.1 fL (ref 80.0–100.0)
Monocytes Absolute: 0.7 10*3/uL (ref 0.1–1.0)
Monocytes Relative: 8 %
Neutro Abs: 6 10*3/uL (ref 1.7–7.7)
Neutrophils Relative %: 75 %
Platelets: 117 10*3/uL — ABNORMAL LOW (ref 150–400)
RBC: 2.63 MIL/uL — ABNORMAL LOW (ref 3.87–5.11)
RDW: 19.8 % — ABNORMAL HIGH (ref 11.5–15.5)
WBC: 8 10*3/uL (ref 4.0–10.5)
nRBC: 0.4 % — ABNORMAL HIGH (ref 0.0–0.2)

## 2020-05-13 LAB — GLUCOSE, CAPILLARY
Glucose-Capillary: 138 mg/dL — ABNORMAL HIGH (ref 70–99)
Glucose-Capillary: 152 mg/dL — ABNORMAL HIGH (ref 70–99)
Glucose-Capillary: 152 mg/dL — ABNORMAL HIGH (ref 70–99)
Glucose-Capillary: 159 mg/dL — ABNORMAL HIGH (ref 70–99)
Glucose-Capillary: 156 mg/dL — ABNORMAL HIGH (ref 70–99)
Glucose-Capillary: 172 mg/dL — ABNORMAL HIGH (ref 70–99)

## 2020-05-13 NOTE — Progress Notes (Signed)
Dr. Josem Kaufmann 05/12/20 progress note reviewed. It indicates no overall improvement seen nor recovery expected for this patient. Hgb dropped again to require 1 unit PRBC transfusion.  HD stopped.    It seems this patient is nearing comfort care. Therefore no role for endoscopic intervention.  GI service signing off.  - H. Loletha Carrow, MD

## 2020-05-13 NOTE — Progress Notes (Signed)
[facility]                                                                              Patient Demographics  Rebecca Harrell, is a 80 y.o. female, DOB - 01-05-1941, VEH:[id number]  Admit date - 04/15/2020   Admitting Physician Vianne Bulls, MD  Outpatient Primary MD for the patient is Shackleford, Vicie Mutters, MD  Outpatient specialists:   LOS - 28  days   Medical records reviewed and are as summarized below:    Chief Complaint  Patient presents with  . Tachycardia       Brief summary   Patient is a 80 year old female with hypertension, type 2 diabetes mellitus, diastolic heart failure, seizures, CVA with right-sided hemiparesis, paroxysmal atrial fibrillation, chronic hypoxic respiratory failure status post tracheostomy, dysphagia status post PEG tube.  Patient is a [place] resident, transferred to acute care due to persistent tachycardia, refractory to intravenous metoprolol.  At the time of transfer, O2 sats mid 90s on 8 L supplemental O2, respiratory rate 30, HR 130, BP 86/45.  Patient was noted to have coarse breath sounds bilaterally, scattered rhonchi, irregularly irregular tachycardia.  Patient was diagnosed with A. fib with RVR, complicated with acute on chronic hypoxic respiratory failure, aspiration pneumonitis.  Patient was admitted to ICU, placed on broad-spectrum antibiotic therapy.  Bronch positive for Pseudomonas.  Patient was placed on anticoagulation with heparin drip, required 1 unit packed RBC transfusion.  Developed intermittent hypotension requiring vasopressors, transitioned to midodrine.  Tracheostomy change 3/22. Patient was transferred to [facility] service on 3/23. On 4/1, discussed with nephrology, Dr. Johnney Ou, ICU team, Dr. Elsworth Soho and ethics committee representative, Dr. Verlon Au.  Patient had nonoliguric severe AKI likely secondary to ATN.  Long-term prognosis guarded. On 4/2, patient was noted to be volume overloaded, trial for temporary  hemodialysis  On 4/3, patient had temporary HD cath placement by CCM, developed ileus, tube feeds placed on hold, NGT placed 4/5: Developed GI bleeding with bloody BMs 4/6: Goals of care discussion with patient's family, continue full scope of treatment 4/8, ileus resolved, tolerating tube feeds 4/10: Nephrology signed off, no further hemodialysis, nontunneled HD catheter to be removed today.  GOC today  Assessment & Plan    Acute on chronic respiratory failure with hypoxia -Plan to return to [facility] where she can continue with invasive mechanical ventilation. -Trach management per CCM  AKI with ATN, nonoliguric -Likely worsening due to atrial fibrillation with RVR, IV contrast, recent infection, ruled out obstruction. -Nephrology following, recommended against long-term hemodialysis.  However family requested short-term trial to see if improves renal function. -Temporary HD cath placed on 4/3, per nephrology, last HD today 4/8 completes 1 week of HD trial.  However no meaningful recovery, overall multiorgan dysfunction and poor prognosis.  No further hemodialysis per nephrology.  Nontunneled HD catheter to be removed -Creatinine trending up 3.0, off HD now   Acute GI bleed, ABLA on anemia of chronic disease -Hemoglobin again down to 6.9 this morning, transfused 1 unit packed RBCs.  Received 1 unit on 05/08/2020 -Xarelto has been held since 4/2 for HD cath placement and has not been resumed. -  Continue to hold Xarelto, reviewed GI recommendations, endoscopic procedure would be a very high risk due to her current condition, recommended comfort care. -Hemoglobin 7.8  Ileus, acute -Improved, tolerating tube feeds  Paroxysmal atrial fibrillation with RVR -Oral amiodarone was on hold due to ileus. -Continue to hold Xarelto, not a candidate for anticoagulation given GI bleed -Continue amiodarone  Hypotension with history of hypertension -BP currently stable  Diabetes mellitus type  2, IDDM -Continue sliding scale insulin  History of CVA, right-sided hemiparesis, dysphagia Continue tracheostomy, PEG tube.  -Continue IV Keppra   Pressure injury, -Stage II coccyx, POA  Severe sepsis/HCAP, septic shock -Status post broad-spectrum antibiotics per CCM, off pressors  Goals of care Overall no significant and meaningful recovery or improvement in the status, poor prognosis, hemodialysis has been discontinued, lethargic, does not follow commands.  GOC today  Obesity Estimated body mass index is 31.01 kg/m as calculated from the following:   Height as of this encounter: '5\' 7"'  (1.702 m).   Weight as of this encounter: 89.8 kg.  Code Status: Full code DVT Prophylaxis:  Eliquis currently on hold due to ileus   Level of Care: Level of care: ICU Family Communication: Discussed with patient's son [name] face-to-face, discussed with patient's son [name] and daughter on 4/6, updated family by Dr. Domingo Cocking on 4/8, goals of care meeting planned on 4/10   Disposition Plan:     Status is: Inpatient  Remains inpatient appropriate because:Inpatient level of care appropriate due to severity of illness   Dispo: The patient is from: [facility]              Anticipated d/c is to: LTAC              Patient currently is not medically stable to d/c.  Goals of care planned meeting on 4/10   Difficult to place patient No      Time Spent in minutes 45 minutes  Procedures:    Consultants:   [facility]  Antimicrobials:   Anti-infectives (From admission, onward)   Start     Dose/Rate Route Frequency Ordered Stop   04/19/20 1115  ceFEPIme (MAXIPIME) 2 g in sodium chloride 0.9 % 100 mL IVPB        2 g 200 mL/hr over 30 Minutes Intravenous Every 24 hours 04/18/20 1503 04/20/20 1051   04/19/20 1102  ceFEPIme (MAXIPIME) 2 g in sodium chloride 0.9 % 100 mL IVPB  Status:  Discontinued        2 g 200 mL/hr over 30 Minutes Intravenous Every 24  hours 04/18/20 1501 04/18/20 1503   04/17/20 1100  linezolid (ZYVOX) IVPB 600 mg        600 mg 300 mL/hr over 60 Minutes Intravenous Every 12 hours 04/17/20 1002 04/20/20 0032   04/16/20 1400  vancomycin (VANCOREADY) IVPB 1250 mg/250 mL  Status:  Discontinued        1,250 mg 166.7 mL/hr over 90 Minutes Intravenous Every 24 hours 04/15/20 1436 04/17/20 0813   04/16/20 0600  ceFEPIme (MAXIPIME) 2 g in sodium chloride 0.9 % 100 mL IVPB  Status:  Discontinued        2 g 200 mL/hr over 30 Minutes Intravenous Every 12 hours 04/15/20 2350 04/18/20 1501   04/16/20 0000  azithromycin (ZITHROMAX) 500 mg in sodium chloride 0.9 % 250 mL IVPB        500 mg 250 mL/hr over 60 Minutes Intravenous Every 24 hours 04/15/20 2350 04/20/20 0100  04/15/20 1700  piperacillin-tazobactam (ZOSYN) IVPB 3.375 g        3.375 g 12.5 mL/hr over 240 Minutes Intravenous  Once 04/15/20 1646 04/15/20 2101   04/15/20 1315  vancomycin (VANCOREADY) IVPB 1500 mg/300 mL        1,500 mg 150 mL/hr over 120 Minutes Intravenous  Once 04/15/20 1305 04/15/20 1617         Medications  Scheduled Meds: . amiodarone  200 mg Per Tube Daily  . chlorhexidine gluconate (MEDLINE KIT)  15 mL Mouth Rinse BID  . Chlorhexidine Gluconate Cloth  6 each Topical Daily  . darbepoetin (ARANESP) injection - DIALYSIS  100 mcg Intravenous Q Wed-HD  . guaiFENesin  5 mL Per Tube Q12H  . insulin aspart  0-15 Units Subcutaneous Q4H  . mouth rinse  15 mL Mouth Rinse 10 times per day  . midodrine  5 mg Per Tube Q8H  . pantoprazole (PROTONIX) IV  40 mg Intravenous Q12H  . QUEtiapine  25 mg Per Tube QHS  . scopolamine  1 patch Transdermal Q72H   Continuous Infusions: . sodium chloride Stopped (05/05/20 1003)  . sodium chloride    . sodium chloride    . sodium chloride 10 mL/hr at 05/13/20 1200  . amiodarone Stopped (05/10/20 1242)  . feeding supplement (VITAL AF 1.2 CAL) 1,000 mL (05/13/20 0219)  . lactated ringers Stopped (05/12/20 1302)  .  levETIRAcetam Stopped (05/12/20 2248)   PRN Meds:.sodium chloride, sodium chloride, sodium chloride, acetaminophen, alteplase, dextrose, fentaNYL (SUBLIMAZE) injection, heparin, ipratropium-albuterol, lidocaine (PF), lidocaine-prilocaine, metoprolol tartrate, ondansetron (ZOFRAN) IV, oxyCODONE, pentafluoroprop-tetrafluoroeth      Subjective:   Marley Pakula was seen and examined today.  Overall lethargic, opens eyes but no following verbal commands.  BP stable   Objective:   Vitals:   05/13/20 1300 05/13/20 1330 05/13/20 1332 05/13/20 1400  BP: 122/75  136/81 131/77  Pulse: (!) 101 (!) 102 98 (!) 103  Resp: (!) 22 (!) 27 (!) 22 (!) 26  Temp:      TempSrc:      SpO2: 95% 97% 90% 98%  Weight:      Height:        Intake/Output Summary (Last 24 hours) at 05/13/2020 1409 Last data filed at 05/13/2020 1200 Gross per 24 hour  Intake 1717.05 ml  Output 125 ml  Net 1592.05 ml     Wt Readings from Last 3 Encounters:  05/13/20 89.8 kg   Physical Exam  General: Lethargic, ill-appearing, on vent, trach, nonverbal  Cardiovascular: S1 S2 clear, RRR. No pedal edema b/l  Respiratory: Coarse breath sounds throughout  Gastrointestinal: Soft, PEG tube +  Ext: right AKA, anasarca, contractures upper extremity  Neuro: no new deficits  Musculoskeletal: No cyanosis, clubbing  Psych: does not follow commands    Data Reviewed:  I have personally reviewed following labs and imaging studies  Micro Results No results found for this or any previous visit (from the past 240 hour(s)).  Radiology Reports CT ABDOMEN WO CONTRAST  Result Date: 04/15/2020 CLINICAL DATA:  Possible G-tube malpositioning EXAM: CT ABDOMEN WITHOUT CONTRAST TECHNIQUE: Multidetector CT imaging of the abdomen was performed following the standard protocol without IV contrast. COMPARISON:  CT chest same day FINDINGS: Lower chest: Cardiomegaly. Coronary artery calcifications. Small bilateral pleural effusions with  patchy bibasilar opacities. Hepatobiliary: Unremarkable unenhanced appearance of the liver. No focal liver lesion identified. Gallbladder is decompressed. No hyperdense gallstone. No biliary dilatation. Pancreas: Unremarkable. No pancreatic ductal dilatation or surrounding inflammatory changes.  Spleen: Grossly unremarkable. Adrenals/Urinary Tract: No definite adrenal nodule. Kidneys appear within normal limits. Excreted contrast within the bilateral renal collecting systems. No hydronephrosis. Stomach/Bowel: Percutaneous gastrostomy tube is appropriately positioned with balloon located in the anterior aspect of the distal gastric body. No evidence of outlet obstruction. Visualized bowel loops within the upper abdomen are within normal limits. No evidence of obstruction or inflammation. Vascular/Lymphatic: Extensive atherosclerosis throughout the aortoiliac axis. No upper abdominal lymphadenopathy. Other: No ascites or pneumoperitoneum. Musculoskeletal: No acute findings. IMPRESSION: 1. Percutaneous gastrostomy tube is appropriately positioned with balloon located intraluminally in the anterior aspect of the distal gastric body. No evidence of outlet obstruction. 2. No acute findings within the abdomen. 3. Small bilateral pleural effusions with patchy bibasilar opacities. Aortic Atherosclerosis (ICD10-I70.0). Electronically Signed   By: Davina Poke D.O.   On: 04/15/2020 16:16   DG Chest 2 View  Result Date: 04/19/2020 CLINICAL DATA:  Dyspnea, diabetes mellitus EXAM: CHEST - 2 VIEW COMPARISON:  Portable exam 1504 hours compared to 04/17/2020 FINDINGS: Tracheostomy tube unchanged. Enlargement of cardiac silhouette. Atherosclerotic calcifications aorta. Extensive BILATERAL pulmonary infiltrates, slightly increased. This could represent pulmonary edema or multifocal pneumonia. Small LEFT pleural effusion. No pneumothorax or acute osseous findings. IMPRESSION: BILATERAL pulmonary infiltrates question pulmonary  edema versus multifocal pneumonia. Enlargement of cardiac silhouette with small LEFT pleural effusion. Electronically Signed   By: Lavonia Dana M.D.   On: 04/19/2020 16:40   DG Abd 1 View  Result Date: 05/06/2020 CLINICAL DATA:  NG tube placement. EXAM: ABDOMEN - 1 VIEW COMPARISON:  KUB and chest x-ray earlier today. FINDINGS: Interval placement of nasogastric tube with tip and side-port over the stomach in the left upper quadrant. Visualized bowel gas pattern is nonobstructive. Remainder of the exam is unchanged. IMPRESSION: Nasogastric tube with tip and side-port over the stomach in the left upper quadrant. Electronically Signed   By: Marin Olp M.D.   On: 05/06/2020 16:53   DG Abd 1 View  Result Date: 05/06/2020 CLINICAL DATA:  Ileus. EXAM: ABDOMEN - 1 VIEW COMPARISON:  November 06, 2010 FINDINGS: The lung bases were not imaged. Air-filled large and small bowel suggest ileus. Early or partial small bowel obstruction not completely excluded as there is no gas in the region of the rectum. No free air, portal venous gas, or pneumatosis. The right hip is abnormal and there is either bony erosion or a fracture. IMPRESSION: 1. Air-filled prominent loops of large and small bowel favored to represent ileus given history. Early or partial small bowel obstruction not completely excluded. Recommend close attention on follow-up. 2. Abnormal right hip with either a comminuted fracture or bony erosion, incompletely evaluated. Recommend clinical correlation and dedicated imaging. Electronically Signed   By: Dorise Bullion III M.D   On: 05/06/2020 15:07   CT Angio Chest PE W and/or Wo Contrast  Result Date: 04/15/2020 CLINICAL DATA:  Tachycardia EXAM: CT ANGIOGRAPHY CHEST WITH CONTRAST TECHNIQUE: Multidetector CT imaging of the chest was performed using the standard protocol during bolus administration of intravenous contrast. Multiplanar CT image reconstructions and MIPs were obtained to evaluate the vascular  anatomy. CONTRAST:  89m OMNIPAQUE IOHEXOL 350 MG/ML SOLN COMPARISON:  04/15/2020 FINDINGS: Cardiovascular: Satisfactory opacification of the pulmonary arteries to the segmental level. No evidence of pulmonary embolism. Thoracic aorta is nonaneurysmal. Common origin of the brachiocephalic and left common carotid arteries. Tortuosity of the descending thoracic aorta. Atherosclerotic calcifications of the aorta and coronary arteries. Heart size is mildly enlarged. No pericardial effusion. Mediastinum/Nodes: Mildly prominent  mediastinal lymph nodes including 12 mm precarinal node (series 5, image 37). No enlarged axillary or hilar lymph nodes identified. Tracheostomy tube is present. Trachea and esophagus within normal limits. No significant findings within the thyroid gland. Lungs/Pleura: Small bilateral pleural effusions. Loculated pleural fluid accumulation within the minor fissure on the right and within the oblique fissure on the left. Patchy bibasilar airspace opacities. Diffuse interlobular septal thickening with ground-glass attenuation throughout both lung fields. No pneumothorax. Upper Abdomen: Although only partially visualized at the edge of the field of view. Patient's percutaneous gastrostomy tube appears malpositioned with balloon appearing extraluminal positioned anterior to the left hepatic lobe (series 5, image 97). Musculoskeletal: No acute osseous abnormality.  No chest wall mass. Review of the MIP images confirms the above findings. IMPRESSION: 1. Negative for acute pulmonary embolism. 2. Findings suggestive of pulmonary edema with small bilateral pleural effusions. Loculated pleural fluid accumulation within the minor fissure on the right and within the oblique fissure on the left. 3. Patchy bibasilar airspace opacities may represent edema, atelectasis, or pneumonia. 4. Patient's percutaneous gastrostomy tube appears malpositioned although is incompletely visualized at the edge of the field of  view. CT of the abdomen could be obtained to further evaluate. 5. Mildly prominent mediastinal lymph nodes, likely reactive. 6. Aortic and coronary artery atherosclerosis (ICD10-I70.0). Electronically Signed   By: Davina Poke D.O.   On: 04/15/2020 15:00   US RENAL  Result Date: 04/18/2020 CLINICAL DATA:  Acute renal injury. EXAM: RENAL / URINARY TRACT ULTRASOUND COMPLETE COMPARISON:  CT abdomen pelvis 04/15/2020 FINDINGS: Right Kidney: Renal measurements: 10.5 x 3.9 x 4.5 cm = volume: 96 mL. Echogenicity within normal limits. No mass or hydronephrosis visualized. Left Kidney: Renal measurements: 9.3 x 4.8 x 4.1 cm = volume: 94 mL. Echogenicity within normal limits. No mass or hydronephrosis visualized. Bladder: Appears normal for degree of bladder distention. Other: Gallstones noted within the gallbladder lumen. At least trace simple free fluid noted within the pelvis. IMPRESSION: 1. Unremarkable renal ultrasound. 2. Cholelithiasis. 3. At least trace simple free fluid within the pelvis. Electronically Signed   By: Iven Finn M.D.   On: 04/18/2020 23:04   DG CHEST PORT 1 VIEW  Result Date: 05/06/2020 CLINICAL DATA:  Central line placement EXAM: PORTABLE CHEST 1 VIEW COMPARISON:  May 05, 2020 FINDINGS: There is a new right central line with the distal tip in the central SVC. No pneumothorax. Stable tracheostomy tube. The cardiomediastinal silhouette is stable. Possible mild pulmonary venous congestion. Patchy infiltrate in the right lung, worsened in the interval. IMPRESSION: 1. Support apparatus as above. 2. New patchy pulmonary infiltrate on the right worrisome for pneumonia or aspiration. Recommend clinical correlation. 3. Possible mild pulmonary venous congestion. Electronically Signed   By: Dorise Bullion III M.D   On: 05/06/2020 15:04   DG CHEST PORT 1 VIEW  Result Date: 05/05/2020 CLINICAL DATA:  Respiratory failure, history of CVA EXAM: PORTABLE CHEST 1 VIEW COMPARISON:  04/21/2020 chest  radiograph. FINDINGS: Tracheostomy tube tip overlies the tracheal air column at the thoracic inlet. Stable cardiomediastinal silhouette with top-normal heart size. No pneumothorax. Patient's right upper extremity obscures the lateral right lung base. No significant pleural effusions. Similar hazy bibasilar lung opacities. No overt pulmonary edema. IMPRESSION: 1. Similar hazy bibasilar lung opacities, favor atelectasis, difficult to exclude a component of aspiration or pneumonia. 2. Tracheostomy tube appears well-positioned. Electronically Signed   By: Ilona Sorrel M.D.   On: 05/05/2020 08:09   DG Chest [facility] [address]  Result Date: 04/21/2020 CLINICAL DATA:  Respiratory failure EXAM: PORTABLE CHEST 1 VIEW COMPARISON:  CT 04/15/2020, radiograph 04/20/2020 FINDINGS: Endotracheal tube tip terminates in the mid trachea, 5 cm from the carina. Telemetry leads and external support devices overlie the chest. The patient's right hand overlies the right lung base as well. Diffuse heterogeneous opacities again seen throughout both lungs, with some interval clearing particularly in the left mid lung. No pneumothorax. Suspect layering left effusion. No visible right effusion. Additional bandlike opacities favoring subsegmental atelectasis. Stable cardiomediastinal contours with a calcified aorta. No acute osseous or soft tissue abnormality. IMPRESSION: Endotracheal tube tip terminates in the mid trachea, 5 cm from the carina. Improving bilateral airspace opacities may reflect some resolving edema or infection. Layering left effusion. Electronically Signed   By: Lovena Le M.D.   On: 04/21/2020 05:34   DG Chest Port 1 View  Result Date: 04/20/2020 CLINICAL DATA:  Acute respiratory distress EXAM: PORTABLE CHEST 1 VIEW COMPARISON:  04/19/2020 FINDINGS: Single frontal view of the chest demonstrates stable tracheostomy tube. Cardiac silhouette remains enlarged. Widespread interstitial and ground-glass opacities are unchanged  since the prior study. Trace bilateral pleural effusions are noted. No pneumothorax. IMPRESSION: 1. Stable widespread interstitial and ground-glass opacities consistent with edema or pneumonia. 2. Stable trace bilateral pleural effusions. Electronically Signed   By: Randa Ngo M.D.   On: 04/20/2020 02:44   DG CHEST PORT 1 VIEW  Result Date: 04/17/2020 CLINICAL DATA:  Pneumonia. EXAM: PORTABLE CHEST 1 VIEW COMPARISON:  April 15, 2020. FINDINGS: Stable cardiomediastinal silhouette. Tracheostomy tube is in good position. No pneumothorax is noted. Small loculated left pleural effusion is noted. Stable bilateral lung opacities are noted concerning for pulmonary edema. Bony thorax is unremarkable. IMPRESSION: Stable bilateral lung opacities are noted concerning for pulmonary edema. Small loculated left pleural effusion is noted. Aortic Atherosclerosis (ICD10-I70.0). Electronically Signed   By: Marijo Conception M.D.   On: 04/17/2020 08:27   DG Chest Port 1 View  Result Date: 04/15/2020 CLINICAL DATA:  Tachycardia and wheezing EXAM: PORTABLE CHEST 1 VIEW COMPARISON:  November 06, 2010. FINDINGS: Tracheostomy catheter tip is 4.3 cm above the carina. No pneumothorax. There is apparent interstitial pulmonary edema with partially loculated small left pleural effusion. Ill-defined opacity noted in left base. There is cardiomegaly with pulmonary venous hypertension. No adenopathy. There is aortic atherosclerosis. No bone lesions. IMPRESSION: Tracheostomy as described without evident pneumothorax. Cardiomegaly with a degree of pulmonary vascular congestion. Evidence of interstitial pulmonary edema with small loculated pleural effusion. The appearance is suspicious for a degree of underlying congestive heart failure. Focal airspace opacity in the left base is concerning for focus of pneumonia, likely superimposed on a degree of interstitial pulmonary edema. Aortic Atherosclerosis (ICD10-I70.0). Electronically Signed   By:  Lowella Grip III M.D.   On: 04/15/2020 13:55   DG Abd Portable 1V  Result Date: 05/09/2020 CLINICAL DATA:  Adynamic ileus. EXAM: PORTABLE ABDOMEN - 1 VIEW COMPARISON:  05/08/2020.  05/06/2020. FINDINGS: NG tube noted with its tip in stable position in the stomach. No bowel distention. No free air. Mild residual contrast in the right colon. Aortoiliac and visceral atherosclerotic vascular calcification. Destructive changes again noted about the right hip. IMPRESSION: 1. NG tube noted with tip over the stomach in stable position. No bowel distention. 2.  Aortoiliac and visceral atherosclerotic vascular disease. 3.  Destructive changes again noted about the right hip. Electronically Signed   By: Marcello Moores  Register   On: 05/09/2020 05:06   DG  Abd Portable 1V  Result Date: 05/08/2020 CLINICAL DATA:  Advanced nasogastric tube, evaluate position EXAM: PORTABLE ABDOMEN - 1 VIEW COMPARISON:  Abdominal radiograph obtained earlier today FINDINGS: The gastric tube has advanced. The tip now overlies the gastric antrum in good position. Right IJ non tunneled hemodialysis catheter with the tip overlying the mid SVC. Tracheostomy tube is present with the tip midline and at the level of the clavicles. Stable cardiomegaly. Atherosclerotic calcification present in the transverse aorta. Marked pulmonary vascular congestion with mild interstitial edema. Unremarkable bowel gas pattern. IMPRESSION: 1. Well-positioned NG tube with the tip overlying the gastric antrum. 2. Right IJ non tunneled hemodialysis catheter with the tip at the mid SVC. 3. Well-positioned tracheostomy tube. 4. Stable cardiomegaly with pulmonary vascular congestion and mild interstitial edema. 5. Aortic atherosclerosis. Electronically Signed   By: Jacqulynn Cadet M.D.   On: 05/08/2020 07:55   DG Abd Portable 1V  Result Date: 05/08/2020 CLINICAL DATA:  Ileus. EXAM: PORTABLE ABDOMEN - 1 VIEW COMPARISON:  05/06/2020. FINDINGS: NG tube tip noted in the upper  most portion of the stomach. Advancement of approximately 10 cm should be considered. Multiple air-filled loops of small and large bowel noted suggesting adynamic ileus. No prominent bowel distention. No free air identified. Hemidiaphragms incompletely imaged. Aortoiliac and visceral atherosclerotic vascular calcification. Radiopacity over the right lower quadrant may represent residual barium. Degenerative change lumbar spine and both hips. IMPRESSION: 1. NG tube tip noted in the upper most portion stomach. Advancement of approximately 10 cm should be considered. 2. Multiple air-filled loops of small and large bowel noted suggesting adynamic ileus. No prominent bowel distention. 3.  Aortoiliac and visceral atherosclerotic vascular disease. Electronically Signed   By: Marcello Moores  Register   On: 05/08/2020 05:42   ECHOCARDIOGRAM COMPLETE  Result Date: 04/16/2020    ECHOCARDIOGRAM REPORT   Patient Name:   LAMIRACLE CHAIDEZ Date of Exam: 04/16/2020 Medical Rec #:  [id number]   Height:       67.0 in Accession #:    [id number]  Weight:       173.3 lb Date of Birth:  [birth year]   BSA:          1.903 m Patient Age:    14 years    BP:           122/77 mmHg Patient Gender: F           HR:           72 bpm. Exam Location:  Inpatient Procedure: 2D Echo, Cardiac Doppler and Color Doppler Indications:    I48.2 Chronic atrial fibrillation; I48.91* Unspeicified atrial                 fibrillation  History:        Patient has no prior history of Echocardiogram examinations.                 Stroke; Risk Factors:Diabetes.  Sonographer:    Bernadene Person RDCS Referring Phys: [id number] [facility] A CHANDRASEKHAR IMPRESSIONS  1. Left ventricular ejection fraction, by estimation, is 55 to 60%. The left ventricle has normal function. The left ventricle has no regional wall motion abnormalities. Left ventricular diastolic function could not be evaluated.  2. Right ventricular systolic function is normal. The right ventricular size is normal. There is  moderately elevated pulmonary artery systolic pressure.  3. Left atrial size was mildly dilated.  4. The mitral valve is grossly normal. Mild to moderate mitral valve regurgitation.  5. Tricuspid valve  regurgitation is moderate.  6. The aortic valve is calcified. There is mild calcification of the aortic valve. There is mild thickening of the aortic valve. Aortic valve regurgitation is mild.  7. Aortic dilatation noted. There is mild dilatation of the ascending aorta, measuring 39 mm. FINDINGS  Left Ventricle: Left ventricular ejection fraction, by estimation, is 55 to 60%. The left ventricle has normal function. The left ventricle has no regional wall motion abnormalities. The left ventricular internal cavity size was normal in size. There is  no left ventricular hypertrophy. Left ventricular diastolic function could not be evaluated due to atrial fibrillation. Left ventricular diastolic function could not be evaluated. Right Ventricle: The right ventricular size is normal. No increase in right ventricular wall thickness. Right ventricular systolic function is normal. There is moderately elevated pulmonary artery systolic pressure. The tricuspid regurgitant velocity is 3.60 m/s, and with an assumed right atrial pressure of 8 mmHg, the estimated right ventricular systolic pressure is 38.8 mmHg. Left Atrium: Left atrial size was mildly dilated. Right Atrium: Right atrial size was normal in size. Pericardium: There is no evidence of pericardial effusion. Mitral Valve: The mitral valve is grossly normal. There is moderate thickening of the mitral valve leaflet(s). There is mild calcification of the mitral valve leaflet(s). Mild to moderate mitral annular calcification. Mild to moderate mitral valve regurgitation. Tricuspid Valve: The tricuspid valve is normal in structure. Tricuspid valve regurgitation is moderate. Aortic Valve: The aortic valve is calcified. There is mild calcification of the aortic valve. There is  mild thickening of the aortic valve. Aortic valve regurgitation is mild. Aortic regurgitation PHT measures 455 msec. Pulmonic Valve: The pulmonic valve was normal in structure. Pulmonic valve regurgitation is not visualized. Aorta: Aortic dilatation noted. There is mild dilatation of the ascending aorta, measuring 39 mm. IAS/Shunts: The atrial septum is grossly normal.  LEFT VENTRICLE PLAX 2D LVIDd:         5.10 cm LVIDs:         3.90 cm LV PW:         0.90 cm LV IVS:        1.00 cm LVOT diam:     2.00 cm LV SV:         49 LV SV Index:   26 LVOT Area:     3.14 cm  LV Volumes (MOD) LV vol d, MOD A2C: 96.4 ml LV vol d, MOD A4C: 84.5 ml LV vol s, MOD A2C: 39.2 ml LV vol s, MOD A4C: 40.4 ml LV SV MOD A2C:     57.2 ml LV SV MOD A4C:     84.5 ml LV SV MOD BP:      52.9 ml RIGHT VENTRICLE RV S prime:     6.94 cm/s TAPSE (M-mode): 1.1 cm LEFT ATRIUM             Index       RIGHT ATRIUM           Index LA diam:        3.70 cm 1.94 cm/m  RA Area:     17.00 cm LA Vol (A2C):   52.0 ml 27.33 ml/m RA Volume:   46.10 ml  24.23 ml/m LA Vol (A4C):   68.7 ml 36.11 ml/m LA Biplane Vol: 51.3 ml 26.96 ml/m  AORTIC VALVE LVOT Vmax:   93.38 cm/s LVOT Vmean:  63.350 cm/s LVOT VTI:    0.156 m AI PHT:      455 msec  AORTA  Ao Root diam: 3.30 cm Ao Asc diam:  3.90 cm MR Peak grad:    89.1 mmHg   TRICUSPID VALVE MR Mean grad:    61.0 mmHg   TR Peak grad:   51.8 mmHg MR Vmax:         472.00 cm/s TR Vmax:        360.00 cm/s MR Vmean:        370.0 cm/s MR PISA:         0.57 cm    SHUNTS MR PISA Eff ROA: 5 mm       Systemic VTI:  0.16 m MR PISA Radius:  0.30 cm     Systemic Diam: 2.00 cm Mertie Moores MD Electronically signed by Mertie Moores MD Signature Date/Time: 04/16/2020/4:09:55 PM    Final     Lab Data:  CBC: Recent Labs  Lab 05/09/20 0344 05/09/20 0612 05/09/20 1238 05/10/20 0307 05/11/20 0446 05/12/20 0357 05/13/20 0527  WBC 9.9  --   --  12.2* 12.4* 10.3 8.0  NEUTROABS 7.3  --   --  9.1* 10.1* 8.0* 6.0  HGB 7.0*    < > 7.9* 7.4* 6.8* 8.0* 7.8*  HCT 21.2*   < > 24.8* 22.9* 22.1* 25.4* 25.8*  MCV 93.0  --   --  94.2 96.5 96.2 98.1  PLT 171  --   --  158 135* 125* 117*   < > = values in this interval not displayed.   Basic Metabolic Panel: Recent Labs  Lab 05/09/20 0344 05/09/20 1524 05/10/20 0307 05/11/20 0448 05/12/20 0357 05/13/20 0527  NA 140 140 138 140 135 137  K 4.3 4.4 3.6 3.5 3.3* 3.6  CL 102 103 102 102 100 100  CO2 '22 23 24 24 26 25  ' GLUCOSE 152* 152* 150* 167* 177* 155*  BUN 162* 173* 118* 123* 72* 85*  CREATININE 3.95* 4.02* 3.09* 3.49* 2.62* 3.02*  CALCIUM 9.1 9.1 8.7* 8.7* 8.5* 8.3*  MG 2.1  --  2.0  --   --   --   PHOS 7.3* 7.3* 5.3* 5.8* 4.2 5.2*   GFR: Estimated Creatinine Clearance: 18 mL/min (A) (by C-G formula based on SCr of 3.02 mg/dL (H)). Liver Function Tests: Recent Labs  Lab 05/09/20 1524 05/10/20 0307 05/11/20 0448 05/12/20 0357 05/13/20 0527  ALBUMIN 2.7* 2.6* 2.4* 2.6* 2.3*   No results for input(s): LIPASE, AMYLASE in the last 168 hours. No results for input(s): AMMONIA in the last 168 hours. Coagulation Profile: No results for input(s): INR, PROTIME in the last 168 hours. Cardiac Enzymes: No results for input(s): CKTOTAL, CKMB, CKMBINDEX, TROPONINI in the last 168 hours. BNP (last 3 results) No results for input(s): PROBNP in the last 8760 hours. HbA1C: No results for input(s): HGBA1C in the last 72 hours. CBG: Recent Labs  Lab 05/12/20 1913 05/12/20 2312 05/13/20 0315 05/13/20 0720 05/13/20 1104  GLUCAP 147* 127* 138* 152* 152*   Lipid Profile: No results for input(s): CHOL, HDL, LDLCALC, TRIG, CHOLHDL, LDLDIRECT in the last 72 hours. Thyroid Function Tests: No results for input(s): TSH, T4TOTAL, FREET4, T3FREE, THYROIDAB in the last 72 hours. Anemia Panel: No results for input(s): VITAMINB12, FOLATE, FERRITIN, TIBC, IRON, RETICCTPCT in the last 72 hours. Urine analysis:    Component Value Date/Time   COLORURINE YELLOW 04/15/2020  1452   APPEARANCEUR HAZY (A) 04/15/2020 1452   LABSPEC 1.010 04/15/2020 1452   PHURINE 5.0 04/15/2020 1452   GLUCOSEU NEGATIVE 04/15/2020 1452   HGBUR MODERATE (A)  04/15/2020 [place] 04/15/2020 [place] 04/15/2020 1452   PROTEINUR NEGATIVE 04/15/2020 1452   NITRITE NEGATIVE 04/15/2020 1452   [place] (A) 04/15/2020 1452     Mardella Nuckles M.D. [facility] 05/13/2020, 2:09 PM  Available via Epic secure chat 7am-7pm After 7 pm, please refer to night coverage provider listed on amion.

## 2020-05-13 NOTE — Progress Notes (Signed)
Palliative Care Progress Note  Reason for consult: Goals of care  I saw and examined Rebecca Harrell today.  She does not track or follow commands.  I met today with patient's daughter/HCPOA, Rebecca Harrell, and son, Rebecca Harrell.  We discussed Rebecca Harrell's clinical course and the fact that she is not a candidate for further dialysis.  Recommended that we place limit of no attempt at resuscitation and family agreeable in light of the fact that her renal failure persists.  Discussed plan to continue current interventions while we work to establish plan on where she can be cared for that will allow family to spend as much time as possible and focus on her living as well as possible for what time she has left.  We discussed that I anticipate prognosis to be less than 2 weeks.    Total time: 40 minutes  Greater than 50%  of this time was spent counseling and coordinating care related to the above assessment and plan.  Rebecca Rough, MD [facility] [phone]

## 2020-05-13 NOTE — Progress Notes (Signed)
[facility] PROGRESS NOTE  Assessment/ Plan: Pt is a 80 y.o. yo female  with PMH of DM, HFpEF, CVA with right-sided hemiparesis/contracture, s/p right BKA, PEG tube, chronic respiratory failure status post tracheostomy, she was recently at [facility] where treated for HCAP developed A. fib with RVR, hypotension and was sent to ER.  Acute kidney injury: Cr 1 as of 04/15/20 now with severe AKI. Suspect hemodynamically mediated AKI in the setting of A. fib with RVR, IV contrast, recent infection.  UA without proteinuria or microscopic hematuria.  Kidney ultrasound ruled out obstruction.   -Her UOP was improving through the week with stable Cr but in the setting of worsening azotemia was given a trial of albumin volume expansion without improvement.  -Have had numerous discussions with Rebecca Harrell re: dialysis.  Recommend against it in light of comorbids and not a candidate for long term.  She desires a short term trial to see if a bit more time will improve renal function.  s/p temp cath 05/06/20 via PCCM and we will do a 1 week trial of HD.  If unable to liberate from HD would need to move to palliative measures.  Dr Johnney Ou discussed with dtr 05/06/20.  Of note palliative care and ethics involved in discussion too - Continue max supportive care. -daily labs, monitor strict I/O please - tolerated HD 05/07/20, HD 05/09/20.  S/p HD 05/11/20 which completed our week of HD trial.  She is not recovering.  It appears that her body is shutting down.  I do not think dialysis is going to fix the overall MSOD and trajectory that she's on.  Since pt is showing no signs of recovery, we will not offer continued dialytic support. - will ask IV team to remove nontunneled HD catheter today. - nothing further to offer at this time.  Will sign off.  Please call with questions.    Hypotension, h/o HTN - on midodrine with normal BP  Acute on chronic hypoxic respiratory failure, h/o trach: Currently  on vent in ICU.  PCCM is following.  Severe sepsis/Healthcare associated pneumonia/septic shock: s/p broad-spectrum antibiotics per primary team.  Off pressors, on midodrine  A. fib with RVR: On dilt, eliquis being held   Malnutrition:  Supplement per RD.  GI bleed: severity of illness precludes scopes per GI  Dispo: palliative care meeting being held today 15:30.  I greatly appreciate their efforts.    Subjective:  Seen in room.  She has no signs of clinical improvement.  hgb still continues to drift downwards.  BUN/CR continue to drift upwards.    Objective Vital signs in last 24 hours: Vitals:   05/13/20 0830 05/13/20 0900 05/13/20 0930 05/13/20 1000  BP: 131/82 121/77 113/71 129/77  Pulse: 100 (!) 102 (!) 102 98  Resp: (!) 21 (!) 23 (!) 22 (!) 24  Temp:      TempSrc:      SpO2: 94% 96% 95% 95%  Weight:      Height:       Weight change: 1 kg  Intake/Output Summary (Last 24 hours) at 05/13/2020 1101 Last data filed at 05/13/2020 1000 Gross per 24 hour  Intake 1787.06 ml  Output 125 ml  Net 1662.06 ml       Labs: Basic Metabolic Panel: Recent Labs  Lab 05/11/20 0448 05/12/20 0357 05/13/20 0527  NA 140 135 137  K 3.5 3.3* 3.6  CL 102 100 100  CO2 '24 26 25  ' GLUCOSE 167* 177*  155*  BUN 123* 72* 85*  CREATININE 3.49* 2.62* 3.02*  CALCIUM 8.7* 8.5* 8.3*  PHOS 5.8* 4.2 5.2*   Liver Function Tests: Recent Labs  Lab 05/11/20 0448 05/12/20 0357 05/13/20 0527  ALBUMIN 2.4* 2.6* 2.3*   No results for input(s): LIPASE, AMYLASE in the last 168 hours. No results for input(s): AMMONIA in the last 168 hours. CBC: Recent Labs  Lab 05/09/20 0344 05/09/20 0612 05/10/20 0307 05/11/20 0446 05/12/20 0357 05/13/20 0527  WBC 9.9  --  12.2* 12.4* 10.3 8.0  NEUTROABS 7.3  --  9.1* 10.1* 8.0* 6.0  HGB 7.0*   < > 7.4* 6.8* 8.0* 7.8*  HCT 21.2*   < > 22.9* 22.1* 25.4* 25.8*  MCV 93.0  --  94.2 96.5 96.2 98.1  PLT 171  --  158 135* 125* 117*   < > = values in this  interval not displayed.   Cardiac Enzymes: No results for input(s): CKTOTAL, CKMB, CKMBINDEX, TROPONINI in the last 168 hours. CBG: Recent Labs  Lab 05/12/20 1624 05/12/20 1913 05/12/20 2312 05/13/20 0315 05/13/20 0720  GLUCAP 140* 147* 127* 138* 152*    Iron Studies:  No results for input(s): IRON, TIBC, TRANSFERRIN, FERRITIN in the last 72 hours. Studies/Results: No results found.  Medications: Infusions: . sodium chloride Stopped (05/05/20 1003)  . sodium chloride    . sodium chloride    . sodium chloride 10 mL/hr at 05/13/20 1000  . amiodarone Stopped (05/10/20 1242)  . feeding supplement (VITAL AF 1.2 CAL) 1,000 mL (05/13/20 0219)  . lactated ringers Stopped (05/12/20 1302)  . levETIRAcetam Stopped (05/12/20 2248)    Scheduled Medications: . amiodarone  200 mg Per Tube Daily  . chlorhexidine gluconate (MEDLINE KIT)  15 mL Mouth Rinse BID  . Chlorhexidine Gluconate Cloth  6 each Topical Daily  . darbepoetin (ARANESP) injection - DIALYSIS  100 mcg Intravenous Q Wed-HD  . guaiFENesin  5 mL Per Tube Q12H  . insulin aspart  0-15 Units Subcutaneous Q4H  . mouth rinse  15 mL Mouth Rinse 10 times per day  . midodrine  5 mg Per Tube Q8H  . pantoprazole (PROTONIX) IV  40 mg Intravenous Q12H  . QUEtiapine  25 mg Per Tube QHS  . scopolamine  1 patch Transdermal Q72H    have reviewed scheduled and prn medications.  Physical Exam: General: Chronically ill looking female, on mechanical ventilation, unresponsive to touch or voice HEENT: NGT   Neck: +trach Heart:RRR, s1s2 nl Lungs: coarse BL, on vent via trach Abdomen:soft,  non-distended Extremities: 2+ anasarca, Contracture of upper extremities, right AKA. Neurology: unresponsive to voice or touch   Rebecca Harrell 05/13/2020,11:01 AM  LOS: 28 days

## 2020-05-14 DIAGNOSIS — I4891 Unspecified atrial fibrillation: Secondary | ICD-10-CM | POA: Diagnosis not present

## 2020-05-14 DIAGNOSIS — J9621 Acute and chronic respiratory failure with hypoxia: Secondary | ICD-10-CM | POA: Diagnosis not present

## 2020-05-14 DIAGNOSIS — J9611 Chronic respiratory failure with hypoxia: Secondary | ICD-10-CM | POA: Diagnosis not present

## 2020-05-14 DIAGNOSIS — Z7189 Other specified counseling: Secondary | ICD-10-CM | POA: Diagnosis not present

## 2020-05-14 DIAGNOSIS — R0603 Acute respiratory distress: Secondary | ICD-10-CM | POA: Diagnosis not present

## 2020-05-14 DIAGNOSIS — N179 Acute kidney failure, unspecified: Secondary | ICD-10-CM | POA: Diagnosis not present

## 2020-05-14 DIAGNOSIS — J810 Acute pulmonary edema: Secondary | ICD-10-CM | POA: Diagnosis not present

## 2020-05-14 LAB — CBC WITH DIFFERENTIAL/PLATELET
Abs Immature Granulocytes: 0.05 10*3/uL (ref 0.00–0.07)
Basophils Absolute: 0 10*3/uL (ref 0.0–0.1)
Basophils Relative: 0 %
Eosinophils Absolute: 0.5 10*3/uL (ref 0.0–0.5)
Eosinophils Relative: 6 %
HCT: 25.8 % — ABNORMAL LOW (ref 36.0–46.0)
Hemoglobin: 8.1 g/dL — ABNORMAL LOW (ref 12.0–15.0)
Immature Granulocytes: 1 %
Lymphocytes Relative: 11 %
Lymphs Abs: 0.8 10*3/uL (ref 0.7–4.0)
MCH: 29.9 pg (ref 26.0–34.0)
MCHC: 31.4 g/dL (ref 30.0–36.0)
MCV: 95.2 fL (ref 80.0–100.0)
Monocytes Absolute: 0.9 10*3/uL (ref 0.1–1.0)
Monocytes Relative: 12 %
Neutro Abs: 5.3 10*3/uL (ref 1.7–7.7)
Neutrophils Relative %: 70 %
Platelets: 123 10*3/uL — ABNORMAL LOW (ref 150–400)
RBC: 2.71 MIL/uL — ABNORMAL LOW (ref 3.87–5.11)
RDW: 19.1 % — ABNORMAL HIGH (ref 11.5–15.5)
WBC: 7.5 10*3/uL (ref 4.0–10.5)
nRBC: 0.3 % — ABNORMAL HIGH (ref 0.0–0.2)

## 2020-05-14 LAB — RENAL FUNCTION PANEL
Albumin: 2.5 g/dL — ABNORMAL LOW (ref 3.5–5.0)
Anion gap: 13 (ref 5–15)
BUN: 98 mg/dL — ABNORMAL HIGH (ref 8–23)
CO2: 23 mmol/L (ref 22–32)
Calcium: 8.6 mg/dL — ABNORMAL LOW (ref 8.9–10.3)
Chloride: 100 mmol/L (ref 98–111)
Creatinine, Ser: 3.36 mg/dL — ABNORMAL HIGH (ref 0.44–1.00)
GFR, Estimated: 14 mL/min — ABNORMAL LOW (ref >60)
Glucose, Bld: 155 mg/dL — ABNORMAL HIGH (ref 70–99)
Phosphorus: 5.4 mg/dL — ABNORMAL HIGH (ref 2.5–4.6)
Potassium: 3.8 mmol/L (ref 3.5–5.1)
Sodium: 136 mmol/L (ref 135–145)

## 2020-05-14 LAB — GLUCOSE, CAPILLARY
Glucose-Capillary: 148 mg/dL — ABNORMAL HIGH (ref 70–99)
Glucose-Capillary: 153 mg/dL — ABNORMAL HIGH (ref 70–99)
Glucose-Capillary: 154 mg/dL — ABNORMAL HIGH (ref 70–99)
Glucose-Capillary: 155 mg/dL — ABNORMAL HIGH (ref 70–99)

## 2020-05-14 MED ORDER — SODIUM CHLORIDE 0.9% FLUSH
10.0000 mL | Freq: Two times a day (BID) | INTRAVENOUS | Status: DC
Start: 2020-05-14 — End: 2020-05-14

## 2020-05-14 MED ORDER — SODIUM CHLORIDE 0.9% FLUSH
10.0000 mL | Freq: Two times a day (BID) | INTRAVENOUS | Status: DC
  Administered 2020-05-14: 10 mL

## 2020-05-14 MED ORDER — QUETIAPINE FUMARATE 25 MG PO TABS: 25.0000 mg | ORAL_TABLET | Freq: Every day | ORAL | Status: AC

## 2020-05-14 MED ORDER — GUAIFENESIN 100 MG/5ML PO SOLN
5.0000 mL | Freq: Two times a day (BID) | ORAL | 0 refills | Status: AC
Start: 2020-05-14 — End: ?

## 2020-05-14 MED ORDER — AMIODARONE HCL 200 MG PO TABS: 200.0000 mg | ORAL_TABLET | Freq: Every day | ORAL | Status: AC

## 2020-05-14 MED ORDER — MIDODRINE HCL 5 MG PO TABS: 5.0000 mg | ORAL_TABLET | Freq: Three times a day (TID) | ORAL | Status: AC

## 2020-05-14 MED ORDER — SODIUM CHLORIDE 0.9% FLUSH
10.0000 mL | INTRAVENOUS | Status: DC | PRN
Start: 2020-05-14 — End: 2020-05-14

## 2020-05-14 MED ORDER — LEVETIRACETAM 500 MG PO TABS: 500.0000 mg | ORAL_TABLET | Freq: Every day | ORAL | Status: AC

## 2020-05-14 MED ORDER — SCOPOLAMINE 1 MG/3DAYS TD PT72: 1.0000 | MEDICATED_PATCH | TRANSDERMAL | 12 refills | Status: AC

## 2020-05-14 MED ORDER — SODIUM CHLORIDE 0.9% FLUSH: 10.0000 mL | INTRAVENOUS | Status: DC | PRN

## 2020-05-14 NOTE — Progress Notes (Signed)
[facility]                                                                              Patient Demographics  Rebecca Harrell, is a 80 y.o. female, DOB - 11/21/1940, XYI:[id number]  Admit date - 04/15/2020   Admitting Physician Vianne Bulls, MD  Outpatient Primary MD for the patient is Shackleford, Vicie Mutters, MD  Outpatient specialists:   LOS - 29  days   Medical records reviewed and are as summarized below:    Chief Complaint  Patient presents with  . Tachycardia       Brief summary   Patient is a 80 year old female with hypertension, type 2 diabetes mellitus, diastolic heart failure, seizures, CVA with right-sided hemiparesis, paroxysmal atrial fibrillation, chronic hypoxic respiratory failure status post tracheostomy, dysphagia status post PEG tube.  Patient is a [place] resident, transferred to acute care due to persistent tachycardia, refractory to intravenous metoprolol.  At the time of transfer, O2 sats mid 90s on 8 L supplemental O2, respiratory rate 30, HR 130, BP 86/45.  Patient was noted to have coarse breath sounds bilaterally, scattered rhonchi, irregularly irregular tachycardia.  Patient was diagnosed with A. fib with RVR, complicated with acute on chronic hypoxic respiratory failure, aspiration pneumonitis.  Patient was admitted to ICU, placed on broad-spectrum antibiotic therapy.  Bronch positive for Pseudomonas.  Patient was placed on anticoagulation with heparin drip, required 1 unit packed RBC transfusion.  Developed intermittent hypotension requiring vasopressors, transitioned to midodrine.  Tracheostomy change 3/22. Patient was transferred to [facility] service on 3/23. On 4/1, discussed with nephrology, Dr. Johnney Ou, ICU team, Dr. Elsworth Soho and ethics committee representative, Dr. Verlon Au.  Patient had nonoliguric severe AKI likely secondary to ATN.  Long-term prognosis guarded. On 4/2, patient was noted to be volume overloaded, trial for temporary  hemodialysis  On 4/3, patient had temporary HD cath placement by CCM, developed ileus, tube feeds placed on hold, NGT placed 4/5: Developed GI bleeding with bloody BMs 4/6: Goals of care discussion with patient's family, continue full scope of treatment 4/8, ileus resolved, tolerating tube feeds 4/10: Nephrology signed off, no further hemodialysis, nontunneled HD catheter to be removed   GOC--> DNR, DNI per family, continue current management   Assessment & Plan    Acute on chronic respiratory failure with hypoxia -Plan to return to [facility] where she can continue with invasive mechanical ventilation. -Trach management per CCM  AKI with ATN, nonoliguric -Likely worsening due to atrial fibrillation with RVR, IV contrast, recent infection, ruled out obstruction. -Nephrology following, recommended against long-term hemodialysis.  However family requested short-term trial to see if improves renal function. -Temporary HD cath placed on 4/3, per nephrology, last HD today 4/8 completes 1 week of HD trial.  However no meaningful recovery, overall multiorgan dysfunction and poor prognosis.  No further hemodialysis per nephrology.  Nontunneled HD catheter to be removed -Creatinine continues to trend up 3.36, off hemodialysis   Acute GI bleed, ABLA on anemia of chronic disease -Hemoglobin again down to 6.9 this morning, transfused 1 unit packed RBCs.  Received 1 unit on 05/08/2020 -Xarelto has been held since 4/2 for  HD cath placement and has not been resumed. -Continue to hold Xarelto, reviewed GI recommendations, endoscopic procedure would be a very high risk due to her current condition, recommended comfort care. -Hemoglobin stable 8.1  Ileus, acute -Improved, tolerating tube feeds  Paroxysmal atrial fibrillation with RVR -Oral amiodarone was on hold due to ileus. -Continue to hold Xarelto, not a candidate for anticoagulation given GI bleed -Continue amiodarone via tube.  Hypotension  with history of hypertension -BP currently stable  Diabetes mellitus type 2, IDDM -Continue sliding scale insulin  History of CVA, right-sided hemiparesis, dysphagia Continue tracheostomy, PEG tube.  -Continue Keppra   Pressure injury, -Stage II coccyx, POA  Severe sepsis/HCAP, septic shock -Status post broad-spectrum antibiotics per CCM, off pressors  Goals of care 4/10 Overall no significant and meaningful recovery or improvement in the status, poor prognosis, hemodialysis has been discontinued.,  Renal function worsening. CODE STATUS was addressed and family agreeable for DNR/DNI however continue current management.    Obesity Estimated body mass index is 31.01 kg/m as calculated from the following:   Height as of this encounter: _0  (1.702 m).   Weight as of this encounter: 89.8 kg.  Code Status: Full code DVT Prophylaxis:  Eliquis currently on hold due to ileus   Level of Care: Level of care: ICU Family Communication: [place] on 4/10   Disposition Plan:     Status is: Inpatient  Remains inpatient appropriate because:Inpatient level of care appropriate due to severity of illness   Dispo: The patient is from: [facility]              Anticipated d/c is to: LTAC              Patient currently is not medically stable to d/c.  Possible DC to [facility].   Difficult to place patient No      Time Spent in minutes 45 minutes  Procedures:    Consultants:   [facility]  Antimicrobials:   Anti-infectives (From admission, onward)   Start     Dose/Rate Route Frequency Ordered Stop   04/19/20 1115  ceFEPIme (MAXIPIME) 2 g in sodium chloride 0.9 % 100 mL IVPB        2 g 200 mL/hr over 30 Minutes Intravenous Every 24 hours 04/18/20 1503 04/20/20 1051   04/19/20 1102  ceFEPIme (MAXIPIME) 2 g in sodium chloride 0.9 % 100 mL IVPB  Status:  Discontinued        2 g 200 mL/hr over 30 Minutes Intravenous Every 24 hours 04/18/20 1501  04/18/20 1503   04/17/20 1100  linezolid (ZYVOX) IVPB 600 mg        600 mg 300 mL/hr over 60 Minutes Intravenous Every 12 hours 04/17/20 1002 04/20/20 0032   04/16/20 1400  vancomycin (VANCOREADY) IVPB 1250 mg/250 mL  Status:  Discontinued        1,250 mg 166.7 mL/hr over 90 Minutes Intravenous Every 24 hours 04/15/20 1436 04/17/20 0813   04/16/20 0600  ceFEPIme (MAXIPIME) 2 g in sodium chloride 0.9 % 100 mL IVPB  Status:  Discontinued        2 g 200 mL/hr over 30 Minutes Intravenous Every 12 hours 04/15/20 2350 04/18/20 1501   04/16/20 0000  azithromycin (ZITHROMAX) 500 mg in sodium chloride 0.9 % 250 mL IVPB        500 mg 250 mL/hr over 60 Minutes Intravenous Every 24 hours 04/15/20 2350 04/20/20 0100   04/15/20 1700  piperacillin-tazobactam (ZOSYN) IVPB  3.375 g        3.375 g 12.5 mL/hr over 240 Minutes Intravenous  Once 04/15/20 1646 04/15/20 2101   04/15/20 1315  vancomycin (VANCOREADY) IVPB 1500 mg/300 mL        1,500 mg 150 mL/hr over 120 Minutes Intravenous  Once 04/15/20 1305 04/15/20 1617         Medications  Scheduled Meds: . amiodarone  200 mg Per Tube Daily  . chlorhexidine gluconate (MEDLINE KIT)  15 mL Mouth Rinse BID  . Chlorhexidine Gluconate Cloth  6 each Topical Daily  . darbepoetin (ARANESP) injection - DIALYSIS  100 mcg Intravenous Q Wed-HD  . guaiFENesin  5 mL Per Tube Q12H  . insulin aspart  0-15 Units Subcutaneous Q4H  . mouth rinse  15 mL Mouth Rinse 10 times per day  . midodrine  5 mg Per Tube Q8H  . pantoprazole (PROTONIX) IV  40 mg Intravenous Q12H  . QUEtiapine  25 mg Per Tube QHS  . scopolamine  1 patch Transdermal Q72H  . sodium chloride flush  10-40 mL Intracatheter Q12H   Continuous Infusions: . sodium chloride Stopped (05/05/20 1003)  . sodium chloride    . sodium chloride    . sodium chloride 10 mL/hr at 05/13/20 1800  . amiodarone Stopped (05/10/20 1242)  . feeding supplement (VITAL AF 1.2 CAL) 1,000 mL (05/13/20 1856)  . lactated  ringers Stopped (05/12/20 1302)  . levETIRAcetam 500 mg (05/13/20 2229)   PRN Meds:.sodium chloride, sodium chloride, sodium chloride, acetaminophen, alteplase, dextrose, fentaNYL (SUBLIMAZE) injection, heparin, ipratropium-albuterol, lidocaine (PF), lidocaine-prilocaine, metoprolol tartrate, ondansetron (ZOFRAN) IV, oxyCODONE, pentafluoroprop-tetrafluoroeth, sodium chloride flush      Subjective:   Rebecca Harrell was seen and examined today.  Alert and awake, no acute issues overnight.    Objective:   Vitals:   05/14/20 0800 05/14/20 0900 05/14/20 1000 05/14/20 1119  BP: 132/80 136/86 (!) 141/81   Pulse: (!) 102 (!) 103 (!) 110   Resp: (!) 23 (!) 24 (!) 27   Temp:    98.1 F (36.7 C)  TempSrc:    Axillary  SpO2: 95% 95% 95%   Weight:      Height:        Intake/Output Summary (Last 24 hours) at 05/14/2020 1243 Last data filed at 05/14/2020 0919 Gross per 24 hour  Intake 1250 ml  Output 500 ml  Net 750 ml     Wt Readings from Last 3 Encounters:  05/13/20 89.8 kg   Physical Exam  General: Alert, ill-appearing, on vent, trach, nonverbal  Cardiovascular: S1 S2 clear, tachycardia  Respiratory: Coarse breath sounds bilaterally  Gastrointestinal: Soft, PEG tube  Ext: right AKA, anasarca, contractures upper extremity  Neuro: unable to assess, does not follow verbal commands  Psych: does not follow verbal commands    Data Reviewed:  I have personally reviewed following labs and imaging studies  Micro Results No results found for this or any previous visit (from the past 240 hour(s)).  Radiology Reports CT ABDOMEN WO CONTRAST  Result Date: 04/15/2020 CLINICAL DATA:  Possible G-tube malpositioning EXAM: CT ABDOMEN WITHOUT CONTRAST TECHNIQUE: Multidetector CT imaging of the abdomen was performed following the standard protocol without IV contrast. COMPARISON:  CT chest same day FINDINGS: Lower chest: Cardiomegaly. Coronary artery calcifications. Small bilateral  pleural effusions with patchy bibasilar opacities. Hepatobiliary: Unremarkable unenhanced appearance of the liver. No focal liver lesion identified. Gallbladder is decompressed. No hyperdense gallstone. No biliary dilatation. Pancreas: Unremarkable. No pancreatic ductal dilatation or  surrounding inflammatory changes. Spleen: Grossly unremarkable. Adrenals/Urinary Tract: No definite adrenal nodule. Kidneys appear within normal limits. Excreted contrast within the bilateral renal collecting systems. No hydronephrosis. Stomach/Bowel: Percutaneous gastrostomy tube is appropriately positioned with balloon located in the anterior aspect of the distal gastric body. No evidence of outlet obstruction. Visualized bowel loops within the upper abdomen are within normal limits. No evidence of obstruction or inflammation. Vascular/Lymphatic: Extensive atherosclerosis throughout the aortoiliac axis. No upper abdominal lymphadenopathy. Other: No ascites or pneumoperitoneum. Musculoskeletal: No acute findings. IMPRESSION: 1. Percutaneous gastrostomy tube is appropriately positioned with balloon located intraluminally in the anterior aspect of the distal gastric body. No evidence of outlet obstruction. 2. No acute findings within the abdomen. 3. Small bilateral pleural effusions with patchy bibasilar opacities. Aortic Atherosclerosis (ICD10-I70.0). Electronically Signed   By: Duanne Guess D.O.   On: 04/15/2020 16:16   DG Chest 2 View  Result Date: 04/19/2020 CLINICAL DATA:  Dyspnea, diabetes mellitus EXAM: CHEST - 2 VIEW COMPARISON:  Portable exam 1504 hours compared to 04/17/2020 FINDINGS: Tracheostomy tube unchanged. Enlargement of cardiac silhouette. Atherosclerotic calcifications aorta. Extensive BILATERAL pulmonary infiltrates, slightly increased. This could represent pulmonary edema or multifocal pneumonia. Small LEFT pleural effusion. No pneumothorax or acute osseous findings. IMPRESSION: BILATERAL pulmonary  infiltrates question pulmonary edema versus multifocal pneumonia. Enlargement of cardiac silhouette with small LEFT pleural effusion. Electronically Signed   By: Ulyses Southward M.D.   On: 04/19/2020 16:40   DG Abd 1 View  Result Date: 05/06/2020 CLINICAL DATA:  NG tube placement. EXAM: ABDOMEN - 1 VIEW COMPARISON:  KUB and chest x-ray earlier today. FINDINGS: Interval placement of nasogastric tube with tip and side-port over the stomach in the left upper quadrant. Visualized bowel gas pattern is nonobstructive. Remainder of the exam is unchanged. IMPRESSION: Nasogastric tube with tip and side-port over the stomach in the left upper quadrant. Electronically Signed   By: Elberta Fortis M.D.   On: 05/06/2020 16:53   DG Abd 1 View  Result Date: 05/06/2020 CLINICAL DATA:  Ileus. EXAM: ABDOMEN - 1 VIEW COMPARISON:  November 06, 2010 FINDINGS: The lung bases were not imaged. Air-filled large and small bowel suggest ileus. Early or partial small bowel obstruction not completely excluded as there is no gas in the region of the rectum. No free air, portal venous gas, or pneumatosis. The right hip is abnormal and there is either bony erosion or a fracture. IMPRESSION: 1. Air-filled prominent loops of large and small bowel favored to represent ileus given history. Early or partial small bowel obstruction not completely excluded. Recommend close attention on follow-up. 2. Abnormal right hip with either a comminuted fracture or bony erosion, incompletely evaluated. Recommend clinical correlation and dedicated imaging. Electronically Signed   By: Gerome Sam III M.D   On: 05/06/2020 15:07   CT Angio Chest PE W and/or Wo Contrast  Result Date: 04/15/2020 CLINICAL DATA:  Tachycardia EXAM: CT ANGIOGRAPHY CHEST WITH CONTRAST TECHNIQUE: Multidetector CT imaging of the chest was performed using the standard protocol during bolus administration of intravenous contrast. Multiplanar CT image reconstructions and MIPs were obtained  to evaluate the vascular anatomy. CONTRAST:  1mL OMNIPAQUE IOHEXOL 350 MG/ML SOLN COMPARISON:  04/15/2020 FINDINGS: Cardiovascular: Satisfactory opacification of the pulmonary arteries to the segmental level. No evidence of pulmonary embolism. Thoracic aorta is nonaneurysmal. Common origin of the brachiocephalic and left common carotid arteries. Tortuosity of the descending thoracic aorta. Atherosclerotic calcifications of the aorta and coronary arteries. Heart size is mildly enlarged. No pericardial effusion.  Mediastinum/Nodes: Mildly prominent mediastinal lymph nodes including 12 mm precarinal node (series 5, image 37). No enlarged axillary or hilar lymph nodes identified. Tracheostomy tube is present. Trachea and esophagus within normal limits. No significant findings within the thyroid gland. Lungs/Pleura: Small bilateral pleural effusions. Loculated pleural fluid accumulation within the minor fissure on the right and within the oblique fissure on the left. Patchy bibasilar airspace opacities. Diffuse interlobular septal thickening with ground-glass attenuation throughout both lung fields. No pneumothorax. Upper Abdomen: Although only partially visualized at the edge of the field of view. Patient's percutaneous gastrostomy tube appears malpositioned with balloon appearing extraluminal positioned anterior to the left hepatic lobe (series 5, image 97). Musculoskeletal: No acute osseous abnormality.  No chest wall mass. Review of the MIP images confirms the above findings. IMPRESSION: 1. Negative for acute pulmonary embolism. 2. Findings suggestive of pulmonary edema with small bilateral pleural effusions. Loculated pleural fluid accumulation within the minor fissure on the right and within the oblique fissure on the left. 3. Patchy bibasilar airspace opacities may represent edema, atelectasis, or pneumonia. 4. Patient's percutaneous gastrostomy tube appears malpositioned although is incompletely visualized at the  edge of the field of view. CT of the abdomen could be obtained to further evaluate. 5. Mildly prominent mediastinal lymph nodes, likely reactive. 6. Aortic and coronary artery atherosclerosis (ICD10-I70.0). Electronically Signed   By: Davina Poke D.O.   On: 04/15/2020 15:00   US RENAL  Result Date: 04/18/2020 CLINICAL DATA:  Acute renal injury. EXAM: RENAL / URINARY TRACT ULTRASOUND COMPLETE COMPARISON:  CT abdomen pelvis 04/15/2020 FINDINGS: Right Kidney: Renal measurements: 10.5 x 3.9 x 4.5 cm = volume: 96 mL. Echogenicity within normal limits. No mass or hydronephrosis visualized. Left Kidney: Renal measurements: 9.3 x 4.8 x 4.1 cm = volume: 94 mL. Echogenicity within normal limits. No mass or hydronephrosis visualized. Bladder: Appears normal for degree of bladder distention. Other: Gallstones noted within the gallbladder lumen. At least trace simple free fluid noted within the pelvis. IMPRESSION: 1. Unremarkable renal ultrasound. 2. Cholelithiasis. 3. At least trace simple free fluid within the pelvis. Electronically Signed   By: Iven Finn M.D.   On: 04/18/2020 23:04   DG CHEST PORT 1 VIEW  Result Date: 05/06/2020 CLINICAL DATA:  Central line placement EXAM: PORTABLE CHEST 1 VIEW COMPARISON:  May 05, 2020 FINDINGS: There is a new right central line with the distal tip in the central SVC. No pneumothorax. Stable tracheostomy tube. The cardiomediastinal silhouette is stable. Possible mild pulmonary venous congestion. Patchy infiltrate in the right lung, worsened in the interval. IMPRESSION: 1. Support apparatus as above. 2. New patchy pulmonary infiltrate on the right worrisome for pneumonia or aspiration. Recommend clinical correlation. 3. Possible mild pulmonary venous congestion. Electronically Signed   By: Dorise Bullion III M.D   On: 05/06/2020 15:04   DG CHEST PORT 1 VIEW  Result Date: 05/05/2020 CLINICAL DATA:  Respiratory failure, history of CVA EXAM: PORTABLE CHEST 1 VIEW  COMPARISON:  04/21/2020 chest radiograph. FINDINGS: Tracheostomy tube tip overlies the tracheal air column at the thoracic inlet. Stable cardiomediastinal silhouette with top-normal heart size. No pneumothorax. Patient's right upper extremity obscures the lateral right lung base. No significant pleural effusions. Similar hazy bibasilar lung opacities. No overt pulmonary edema. IMPRESSION: 1. Similar hazy bibasilar lung opacities, favor atelectasis, difficult to exclude a component of aspiration or pneumonia. 2. Tracheostomy tube appears well-positioned. Electronically Signed   By: Ilona Sorrel M.D.   On: 05/05/2020 08:09   DG Chest [facility]  1 View  Result Date: 04/21/2020 CLINICAL DATA:  Respiratory failure EXAM: PORTABLE CHEST 1 VIEW COMPARISON:  CT 04/15/2020, radiograph 04/20/2020 FINDINGS: Endotracheal tube tip terminates in the mid trachea, 5 cm from the carina. Telemetry leads and external support devices overlie the chest. The patient's right hand overlies the right lung base as well. Diffuse heterogeneous opacities again seen throughout both lungs, with some interval clearing particularly in the left mid lung. No pneumothorax. Suspect layering left effusion. No visible right effusion. Additional bandlike opacities favoring subsegmental atelectasis. Stable cardiomediastinal contours with a calcified aorta. No acute osseous or soft tissue abnormality. IMPRESSION: Endotracheal tube tip terminates in the mid trachea, 5 cm from the carina. Improving bilateral airspace opacities may reflect some resolving edema or infection. Layering left effusion. Electronically Signed   By: Lovena Le M.D.   On: 04/21/2020 05:34   DG Chest Port 1 View  Result Date: 04/20/2020 CLINICAL DATA:  Acute respiratory distress EXAM: PORTABLE CHEST 1 VIEW COMPARISON:  04/19/2020 FINDINGS: Single frontal view of the chest demonstrates stable tracheostomy tube. Cardiac silhouette remains enlarged. Widespread interstitial and  ground-glass opacities are unchanged since the prior study. Trace bilateral pleural effusions are noted. No pneumothorax. IMPRESSION: 1. Stable widespread interstitial and ground-glass opacities consistent with edema or pneumonia. 2. Stable trace bilateral pleural effusions. Electronically Signed   By: Randa Ngo M.D.   On: 04/20/2020 02:44   DG CHEST PORT 1 VIEW  Result Date: 04/17/2020 CLINICAL DATA:  Pneumonia. EXAM: PORTABLE CHEST 1 VIEW COMPARISON:  April 15, 2020. FINDINGS: Stable cardiomediastinal silhouette. Tracheostomy tube is in good position. No pneumothorax is noted. Small loculated left pleural effusion is noted. Stable bilateral lung opacities are noted concerning for pulmonary edema. Bony thorax is unremarkable. IMPRESSION: Stable bilateral lung opacities are noted concerning for pulmonary edema. Small loculated left pleural effusion is noted. Aortic Atherosclerosis (ICD10-I70.0). Electronically Signed   By: Marijo Conception M.D.   On: 04/17/2020 08:27   DG Chest Port 1 View  Result Date: 04/15/2020 CLINICAL DATA:  Tachycardia and wheezing EXAM: PORTABLE CHEST 1 VIEW COMPARISON:  November 06, 2010. FINDINGS: Tracheostomy catheter tip is 4.3 cm above the carina. No pneumothorax. There is apparent interstitial pulmonary edema with partially loculated small left pleural effusion. Ill-defined opacity noted in left base. There is cardiomegaly with pulmonary venous hypertension. No adenopathy. There is aortic atherosclerosis. No bone lesions. IMPRESSION: Tracheostomy as described without evident pneumothorax. Cardiomegaly with a degree of pulmonary vascular congestion. Evidence of interstitial pulmonary edema with small loculated pleural effusion. The appearance is suspicious for a degree of underlying congestive heart failure. Focal airspace opacity in the left base is concerning for focus of pneumonia, likely superimposed on a degree of interstitial pulmonary edema. Aortic Atherosclerosis  (ICD10-I70.0). Electronically Signed   By: Lowella Grip III M.D.   On: 04/15/2020 13:55   DG Abd Portable 1V  Result Date: 05/09/2020 CLINICAL DATA:  Adynamic ileus. EXAM: PORTABLE ABDOMEN - 1 VIEW COMPARISON:  05/08/2020.  05/06/2020. FINDINGS: NG tube noted with its tip in stable position in the stomach. No bowel distention. No free air. Mild residual contrast in the right colon. Aortoiliac and visceral atherosclerotic vascular calcification. Destructive changes again noted about the right hip. IMPRESSION: 1. NG tube noted with tip over the stomach in stable position. No bowel distention. 2.  Aortoiliac and visceral atherosclerotic vascular disease. 3.  Destructive changes again noted about the right hip. Electronically Signed   By: Marcello Moores  Register   On: 05/09/2020 05:06  DG Abd Portable 1V  Result Date: 05/08/2020 CLINICAL DATA:  Advanced nasogastric tube, evaluate position EXAM: PORTABLE ABDOMEN - 1 VIEW COMPARISON:  Abdominal radiograph obtained earlier today FINDINGS: The gastric tube has advanced. The tip now overlies the gastric antrum in good position. Right IJ non tunneled hemodialysis catheter with the tip overlying the mid SVC. Tracheostomy tube is present with the tip midline and at the level of the clavicles. Stable cardiomegaly. Atherosclerotic calcification present in the transverse aorta. Marked pulmonary vascular congestion with mild interstitial edema. Unremarkable bowel gas pattern. IMPRESSION: 1. Well-positioned NG tube with the tip overlying the gastric antrum. 2. Right IJ non tunneled hemodialysis catheter with the tip at the mid SVC. 3. Well-positioned tracheostomy tube. 4. Stable cardiomegaly with pulmonary vascular congestion and mild interstitial edema. 5. Aortic atherosclerosis. Electronically Signed   By: Malachy Moan M.D.   On: 05/08/2020 07:55   DG Abd Portable 1V  Result Date: 05/08/2020 CLINICAL DATA:  Ileus. EXAM: PORTABLE ABDOMEN - 1 VIEW COMPARISON:   05/06/2020. FINDINGS: NG tube tip noted in the upper most portion of the stomach. Advancement of approximately 10 cm should be considered. Multiple air-filled loops of small and large bowel noted suggesting adynamic ileus. No prominent bowel distention. No free air identified. Hemidiaphragms incompletely imaged. Aortoiliac and visceral atherosclerotic vascular calcification. Radiopacity over the right lower quadrant may represent residual barium. Degenerative change lumbar spine and both hips. IMPRESSION: 1. NG tube tip noted in the upper most portion stomach. Advancement of approximately 10 cm should be considered. 2. Multiple air-filled loops of small and large bowel noted suggesting adynamic ileus. No prominent bowel distention. 3.  Aortoiliac and visceral atherosclerotic vascular disease. Electronically Signed   By: Maisie Fus  Register   On: 05/08/2020 05:42   ECHOCARDIOGRAM COMPLETE  Result Date: 04/16/2020    ECHOCARDIOGRAM REPORT   Patient Name:   NYESHA CLIFF Date of Exam: 04/16/2020 Medical Rec #:  [id number]   Height:       67.0 in Accession #:    [id number]  Weight:       173.3 lb Date of Birth:  04-20-1940   BSA:          1.903 m Patient Age:    79 years    BP:           122/77 mmHg Patient Gender: F           HR:           72 bpm. Exam Location:  Inpatient Procedure: 2D Echo, Cardiac Doppler and Color Doppler Indications:    I48.2 Chronic atrial fibrillation; I48.91* Unspeicified atrial                 fibrillation  History:        Patient has no prior history of Echocardiogram examinations.                 Stroke; Risk Factors:Diabetes.  Sonographer:    Eulah Pont RDCS Referring Phys: [id number] [facility] A CHANDRASEKHAR IMPRESSIONS  1. Left ventricular ejection fraction, by estimation, is 55 to 60%. The left ventricle has normal function. The left ventricle has no regional wall motion abnormalities. Left ventricular diastolic function could not be evaluated.  2. Right ventricular systolic function is  normal. The right ventricular size is normal. There is moderately elevated pulmonary artery systolic pressure.  3. Left atrial size was mildly dilated.  4. The mitral valve is grossly normal. Mild to moderate mitral valve regurgitation.  5. Tricuspid  valve regurgitation is moderate.  6. The aortic valve is calcified. There is mild calcification of the aortic valve. There is mild thickening of the aortic valve. Aortic valve regurgitation is mild.  7. Aortic dilatation noted. There is mild dilatation of the ascending aorta, measuring 39 mm. FINDINGS  Left Ventricle: Left ventricular ejection fraction, by estimation, is 55 to 60%. The left ventricle has normal function. The left ventricle has no regional wall motion abnormalities. The left ventricular internal cavity size was normal in size. There is  no left ventricular hypertrophy. Left ventricular diastolic function could not be evaluated due to atrial fibrillation. Left ventricular diastolic function could not be evaluated. Right Ventricle: The right ventricular size is normal. No increase in right ventricular wall thickness. Right ventricular systolic function is normal. There is moderately elevated pulmonary artery systolic pressure. The tricuspid regurgitant velocity is 3.60 m/s, and with an assumed right atrial pressure of 8 mmHg, the estimated right ventricular systolic pressure is 83.1 mmHg. Left Atrium: Left atrial size was mildly dilated. Right Atrium: Right atrial size was normal in size. Pericardium: There is no evidence of pericardial effusion. Mitral Valve: The mitral valve is grossly normal. There is moderate thickening of the mitral valve leaflet(s). There is mild calcification of the mitral valve leaflet(s). Mild to moderate mitral annular calcification. Mild to moderate mitral valve regurgitation. Tricuspid Valve: The tricuspid valve is normal in structure. Tricuspid valve regurgitation is moderate. Aortic Valve: The aortic valve is calcified. There  is mild calcification of the aortic valve. There is mild thickening of the aortic valve. Aortic valve regurgitation is mild. Aortic regurgitation PHT measures 455 msec. Pulmonic Valve: The pulmonic valve was normal in structure. Pulmonic valve regurgitation is not visualized. Aorta: Aortic dilatation noted. There is mild dilatation of the ascending aorta, measuring 39 mm. IAS/Shunts: The atrial septum is grossly normal.  LEFT VENTRICLE PLAX 2D LVIDd:         5.10 cm LVIDs:         3.90 cm LV PW:         0.90 cm LV IVS:        1.00 cm LVOT diam:     2.00 cm LV SV:         49 LV SV Index:   26 LVOT Area:     3.14 cm  LV Volumes (MOD) LV vol d, MOD A2C: 96.4 ml LV vol d, MOD A4C: 84.5 ml LV vol s, MOD A2C: 39.2 ml LV vol s, MOD A4C: 40.4 ml LV SV MOD A2C:     57.2 ml LV SV MOD A4C:     84.5 ml LV SV MOD BP:      52.9 ml RIGHT VENTRICLE RV S prime:     6.94 cm/s TAPSE (M-mode): 1.1 cm LEFT ATRIUM             Index       RIGHT ATRIUM           Index LA diam:        3.70 cm 1.94 cm/m  RA Area:     17.00 cm LA Vol (A2C):   52.0 ml 27.33 ml/m RA Volume:   46.10 ml  24.23 ml/m LA Vol (A4C):   68.7 ml 36.11 ml/m LA Biplane Vol: 51.3 ml 26.96 ml/m  AORTIC VALVE LVOT Vmax:   93.38 cm/s LVOT Vmean:  63.350 cm/s LVOT VTI:    0.156 m AI PHT:      455 msec  AORTA Ao Root diam: 3.30 cm Ao Asc diam:  3.90 cm MR Peak grad:    89.1 mmHg   TRICUSPID VALVE MR Mean grad:    61.0 mmHg   TR Peak grad:   51.8 mmHg MR Vmax:         472.00 cm/s TR Vmax:        360.00 cm/s MR Vmean:        370.0 cm/s MR PISA:         0.57 cm    SHUNTS MR PISA Eff ROA: 5 mm       Systemic VTI:  0.16 m MR PISA Radius:  0.30 cm     Systemic Diam: 2.00 cm Mertie Moores MD Electronically signed by Mertie Moores MD Signature Date/Time: 04/16/2020/4:09:55 PM    Final     Lab Data:  CBC: Recent Labs  Lab 05/10/20 0307 05/11/20 0446 05/12/20 0357 05/13/20 0527 05/14/20 0417  WBC 12.2* 12.4* 10.3 8.0 7.5  NEUTROABS 9.1* 10.1* 8.0* 6.0 5.3  HGB 7.4*  6.8* 8.0* 7.8* 8.1*  HCT 22.9* 22.1* 25.4* 25.8* 25.8*  MCV 94.2 96.5 96.2 98.1 95.2  PLT 158 135* 125* 117* 401*   Basic Metabolic Panel: Recent Labs  Lab 05/09/20 0344 05/09/20 1524 05/10/20 0307 05/11/20 0448 05/12/20 0357 05/13/20 0527 05/14/20 0417  NA 140   < > 138 140 135 137 136  K 4.3   < > 3.6 3.5 3.3* 3.6 3.8  CL 102   < > 102 102 100 100 100  CO2 22   < > _0 GLUCOSE 152*   < > 150* 167* 177* 155* 155*  BUN 162*   < > 118* 123* 72* 85* 98*  CREATININE 3.95*   < > 3.09* 3.49* 2.62* 3.02* 3.36*  CALCIUM 9.1   < > 8.7* 8.7* 8.5* 8.3* 8.6*  MG 2.1  --  2.0  --   --   --   --   PHOS 7.3*   < > 5.3* 5.8* 4.2 5.2* 5.4*   < > = values in this interval not displayed.   GFR: Estimated Creatinine Clearance: 16.1 mL/min (A) (by C-G formula based on SCr of 3.36 mg/dL (H)). Liver Function Tests: Recent Labs  Lab 05/10/20 0307 05/11/20 0448 05/12/20 0357 05/13/20 0527 05/14/20 0417  ALBUMIN 2.6* 2.4* 2.6* 2.3* 2.5*   No results for input(s): LIPASE, AMYLASE in the last 168 hours. No results for input(s): AMMONIA in the last 168 hours. Coagulation Profile: No results for input(s): INR, PROTIME in the last 168 hours. Cardiac Enzymes: No results for input(s): CKTOTAL, CKMB, CKMBINDEX, TROPONINI in the last 168 hours. BNP (last 3 results) No results for input(s): PROBNP in the last 8760 hours. HbA1C: No results for input(s): HGBA1C in the last 72 hours. CBG: Recent Labs  Lab 05/13/20 2043 05/14/20 0057 05/14/20 0355 05/14/20 0733 05/14/20 1117  GLUCAP 172* 153* 155* 148* 154*   Lipid Profile: No results for input(s): CHOL, HDL, LDLCALC, TRIG, CHOLHDL, LDLDIRECT in the last 72 hours. Thyroid Function Tests: No results for input(s): TSH, T4TOTAL, FREET4, T3FREE, THYROIDAB in the last 72 hours. Anemia Panel: No results for input(s): VITAMINB12, FOLATE, FERRITIN, TIBC, IRON, RETICCTPCT in the last 72 hours. Urine analysis:    Component Value  Date/Time   COLORURINE YELLOW 04/15/2020 1452   APPEARANCEUR HAZY (A) 04/15/2020 1452   LABSPEC 1.010 04/15/2020 1452   PHURINE 5.0 04/15/2020 1452   [place] 04/15/2020 1452  HGBUR MODERATE (A) 04/15/2020 1452   BILIRUBINUR NEGATIVE 04/15/2020 1452   KETONESUR NEGATIVE 04/15/2020 1452   PROTEINUR NEGATIVE 04/15/2020 1452   NITRITE NEGATIVE 04/15/2020 1452   LEUKOCYTESUR LARGE (A) 04/15/2020 1452     Komal Stangelo M.D. [facility] 05/14/2020, 12:43 PM  Available via Epic secure chat 7am-7pm After 7 pm, please refer to night coverage provider listed on amion.

## 2020-05-14 NOTE — Plan of Care (Signed)
?  Problem: Education: ?Goal: Knowledge of disease or condition will improve ?Outcome: Adequate for Discharge ?Goal: Understanding of medication regimen will improve ?Outcome: Adequate for Discharge ?Goal: Individualized Educational Video(s) ?Outcome: Adequate for Discharge ?  ?Problem: Activity: ?Goal: Ability to tolerate increased activity will improve ?Outcome: Adequate for Discharge ?  ?Problem: Cardiac: ?Goal: Ability to achieve and maintain adequate cardiopulmonary perfusion will improve ?Outcome: Adequate for Discharge ?  ?Problem: Health Behavior/Discharge Planning: ?Goal: Ability to safely manage health-related needs after discharge will improve ?Outcome: Adequate for Discharge ?  ?Problem: Education: ?Goal: Knowledge of General Education information will improve ?Description: Including pain rating scale, medication(s)/side effects and non-pharmacologic comfort measures ?Outcome: Adequate for Discharge ?  ?Problem: Health Behavior/Discharge Planning: ?Goal: Ability to manage health-related needs will improve ?Outcome: Adequate for Discharge ?  ?Problem: Clinical Measurements: ?Goal: Ability to maintain clinical measurements within normal limits will improve ?Outcome: Adequate for Discharge ?Goal: Will remain free from infection ?Outcome: Adequate for Discharge ?Goal: Diagnostic test results will improve ?Outcome: Adequate for Discharge ?Goal: Respiratory complications will improve ?Outcome: Adequate for Discharge ?Goal: Cardiovascular complication will be avoided ?Outcome: Adequate for Discharge ?  ?Problem: Activity: ?Goal: Risk for activity intolerance will decrease ?Outcome: Adequate for Discharge ?  ?Problem: Nutrition: ?Goal: Adequate nutrition will be maintained ?Outcome: Adequate for Discharge ?  ?Problem: Coping: ?Goal: Level of anxiety will decrease ?Outcome: Adequate for Discharge ?  ?Problem: Elimination: ?Goal: Will not experience complications related to bowel motility ?Outcome: Adequate for  Discharge ?Goal: Will not experience complications related to urinary retention ?Outcome: Adequate for Discharge ?  ?Problem: Pain Managment: ?Goal: General experience of comfort will improve ?Outcome: Adequate for Discharge ?  ?Problem: Safety: ?Goal: Ability to remain free from injury will improve ?Outcome: Adequate for Discharge ?  ?Problem: Skin Integrity: ?Goal: Risk for impaired skin integrity will decrease ?Outcome: Adequate for Discharge ?  ?

## 2020-05-14 NOTE — TOC Transition Note (Signed)
Transition of Care [facility]) - CM/SW Discharge Note   Patient Details  Name: Rebecca Harrell MRN: [id number] Date of Birth: 10-22-1940  [facility] CM/SW Contact:  Bartholomew Crews, RN Phone Number: [phone] 05/14/2020, 2:36 PM   Clinical Narrative:     Notified of patient's readiness to transition back to [facility]. [facility] able to accept patient today. Patient's daughter, [name], made aware. MD notified for DC summary. Will arrange [facility] for transport.   1535: Received bed assignment - Rm 315. Nurse to call report to [phone] ([phone]). [facility] contacted for pick up.   No further TOC needs identified.   Final next level of care: Long Term Acute Care (LTAC) Barriers to Discharge: No Barriers Identified   Patient Goals and CMS Choice Patient states their goals for this hospitalization and ongoing recovery are:: return [facility].gov Compare Post Acute Care list provided to:: Patient Represenative (must comment) (daughter, [name]) Choice offered to / list presented to : Adult Children  Discharge Placement              Patient chooses bed at: [facility] Patient to be transferred to facility by: [place] Name of family member notified: [place] Patient and family notified of of transfer: 05/14/20  Discharge Plan and Services                DME Arranged: N/A DME Agency: NA       HH Arranged: NA HH Agency: NA        Social Determinants of Health (SDOH) Interventions     Readmission Risk Interventions No flowsheet data found.

## 2020-05-14 NOTE — Discharge Summary (Signed)
Physician Discharge Summary   Patient ID: Rebecca Harrell MRN: [id number] DOB/AGE: Sep 29, 1940 80 y.o.  Admit date: 04/15/2020 Discharge date: 05/14/2020  Primary Care Physician:  Aaron Edelman, MD   Recommendations for Outpatient Follow-up:  1. DNR/DNI, follow-up as needed 2. VENT SETTINGS  Pressure control 30% PEEP 5 Respirations 20 Pressure control above PEEP   Home Health: Patient returning to LTAC Equipment/Devices:   Discharge Condition: Overall poor prognosis CODE STATUS: DNR/DNI Diet recommendation: Tube feeds   Discharge Diagnoses:    Acute on chronic respiratory failure with hypoxia on vent Acute kidney injury with ATN, nonoliguric, OFF hemodialysis Acute GI bleed Acute blood loss anemia on anemia of chronic disease Acute ileus, resolved Paroxysmal atrial fibrillation with RVR Hypotension with history of hypertension Diabetes mellitus type 2, IDDM History of CVA, right-sided hemiparesis, dysphagia Pressure injury, stage II coccyx, POA Severe sepsis/HCAP, septic shock Obesity DNR/DNI    Consults:    Pulmonary critical care medicine Palliative medicine Nephrology Gastroenterology   Allergies:   Allergies  Allergen Reactions  . Quinolones     UNK reaction     DISCHARGE MEDICATIONS: Allergies as of 05/14/2020      Reactions   Quinolones    UNK reaction      Medication List    STOP taking these medications   amLODipine 10 MG tablet Commonly known as: NORVASC   aspirin EC 81 MG tablet   furosemide 20 MG tablet Commonly known as: LASIX   hydrALAZINE 50 MG tablet Commonly known as: APRESOLINE   insulin glargine 100 UNIT/ML injection Commonly known as: LANTUS   thiamine 100 MG tablet Commonly known as: Vitamin B-1     TAKE these medications   acetaminophen 325 MG tablet Commonly known as: TYLENOL Take by mouth every 6 (six) hours as needed for mild pain or fever.   amiodarone 200 MG tablet Commonly known as:  PACERONE Place 1 tablet (200 mg total) into feeding tube daily. Start taking on: May 15, 2020   ammonium lactate 12 % lotion Commonly known as: LAC-HYDRIN Apply 1 application topically in the morning, at noon, and at bedtime. Apply to back every shift change   budesonide 0.5 MG/2ML nebulizer solution Commonly known as: PULMICORT Take 0.5 mg by nebulization 2 (two) times daily.   chlorhexidine 0.12 % solution Commonly known as: PERIDEX Use as directed 3 mLs in the mouth or throat 2 (two) times daily.   Desenex 2 % powder Generic drug: miconazole Apply 1 application topically every 12 (twelve) hours.   feeding supplement (NEPRO CARB STEADY) Liqd Place 35 mLs into feeding tube continuous.   guaiFENesin 100 MG/5ML Soln Commonly known as: ROBITUSSIN Place 5 mLs (100 mg total) into feeding tube every 12 (twelve) hours.   insulin regular 100 units/mL injection Commonly known as: NOVOLIN R Inject 2-10 Units into the skin every 6 (six) hours. Sliding scale: CBG 151-200 = 2 units, 201-250 = 4 units, 251-300 = 6 units, 301-350 = 8 units, 351-400 = 10 units.   ipratropium-albuterol 0.5-2.5 (3) MG/3ML Soln Commonly known as: DUONEB Take 3 mLs by nebulization every 6 (six) hours.   lansoprazole 30 MG capsule Commonly known as: PREVACID Take 30 mg by mouth daily at 12 noon.   levETIRAcetam 500 MG tablet Commonly known as: Keppra Take 1 tablet (500 mg total) by mouth daily. Via tube What changed:   medication strength  how much to take  when to take this  additional instructions   midodrine 5 MG tablet Commonly  known as: PROAMATINE Place 1 tablet (5 mg total) into feeding tube every 8 (eight) hours.   QUEtiapine 25 MG tablet Commonly known as: SEROQUEL Place 1 tablet (25 mg total) into feeding tube at bedtime.   scopolamine 1 MG/3DAYS Commonly known as: TRANSDERM-SCOP Place 1 patch (1.5 mg total) onto the skin every 3 (three) days. 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  Patient is a [place] resident, transferred to acute care due to persistent tachycardia, refractory to intravenous metoprolol.  At the time of transfer, O2 sats mid 90s on 8 L supplemental O2, respiratory rate 30, HR 130, BP 86/45.  Patient was noted to have coarse breath sounds bilaterally, scattered rhonchi, irregularly irregular tachycardia.  Patient was diagnosed with A. fib with RVR, complicated with acute on chronic hypoxic respiratory failure, aspiration pneumonitis.  Patient was admitted to ICU, placed on broad-spectrum antibiotic therapy.  Bronch positive for Pseudomonas.  Patient was placed on anticoagulation with heparin drip, required 1 unit packed RBC transfusion.  Developed intermittent hypotension requiring vasopressors, transitioned to midodrine.  Tracheostomy change 3/22. Patient was transferred to [facility] service on 3/23. On 4/1, discussed with nephrology, Dr. Johnney Ou, ICU team, Dr. Elsworth Soho and ethics committee representative, Dr. Verlon Au.  Patient had nonoliguric severe AKI likely secondary to ATN.  Long-term prognosis guarded. On 4/2, patient was noted to be volume overloaded, trial for temporary hemodialysis  On 4/3, patient had temporary HD cath placement by CCM, developed ileus, tube feeds placed on hold, NGT placed 4/5: Developed GI bleeding with bloody BMs 4/6: Goals of care discussion with patient's family, continue full scope of treatment 4/8,  ileus resolved, tolerating tube feeds 4/10: Nephrology signed off, no further hemodialysis, nontunneled HD catheter to be removed  Goals of care--> DNR, DNI per family, continue current management  Hospital Course:    Acute on chronic respiratory failure with hypoxia -Plan to return to [facility] where she can continue with invasive mechanical ventilation. -Trach management, please see vent settings as above  AKI with ATN, nonoliguric -Likely worsening due to atrial fibrillation with RVR, IV contrast, recent infection, ruled out obstruction. -Nephrology following, recommended against long-term hemodialysis.  However family requested short-term trial to see if improves renal function. -Temporary HD cath placed on 4/3, per nephrology, last HD today 4/8 completes 1 week of HD trial.  However no meaningful recovery, overall multiorgan dysfunction and poor prognosis.  No further hemodialysis per nephrology.   - Nontunneled HD catheter removed -Creatinine continues to trend up 3.36, off hemodialysis, overall poor prognosis and renal function will continue to deteriorate after hemodialysis.   Acute GI bleed, ABLA on anemia of chronic disease -Hemoglobin again down to 6.9 this morning, transfused 1 unit packed RBCs.  Received 1 unit on 05/08/2020 -Xarelto has been held since 4/2 for HD cath placement and has not been resumed. -  Continue to hold Xarelto, reviewed GI recommendations, endoscopic procedure would be a very high risk due to her current condition, recommended comfort care. -Hemoglobin stable 8.1  Ileus, acute -Improved, currently tolerating tube feeds  Paroxysmal atrial fibrillation with RVR -Oral amiodarone was on hold due to ileus.  Patient was placed on IV amiodarone briefly. -Xarelto discontinued, not a candidate for anticoagulation given GI bleed -Continue amiodarone via tube.  Hypotension with history of hypertension -BP currently stable, continue midodrine  Diabetes  mellitus type 2, IDDM -Continue sliding scale insulin  History of CVA, right-sided hemiparesis, dysphagia Continue tracheostomy, PEG tube.  -Continue Keppra via PEG tube   Pressure injury, -Stage II coccyx, POA  Severe sepsis/HCAP, septic shock -Status post broad-spectrum antibiotics per CCM, off pressors  Goals of care 4/10 Overall no significant and meaningful recovery or improvement in the status, poor prognosis, hemodialysis has been discontinued. Renal function worsening off the hemodialysis.  Anticipate prognosis to be less than 2 weeks. CODE STATUS was addressed and family agreeable for DNR/DNI however continue current management.    Obesity Estimated body mass index is 31.01 kg/m as calculated from the following:   Height as of this encounter: '5\' 7"'$  (1.702 m).   Weight as of this encounter: 89.8 kg.    Day of Discharge S: Alert and awake, otherwise not following any verbal commands.  BP 133/84   Pulse 100   Temp 98.1 F (36.7 C) (Axillary)   Resp (!) 25   Ht '5\' 7"'$  (1.702 m)   Wt 89.8 kg   SpO2 95%   BMI 31.01 kg/m   Physical Exam: General: Lethargic, ill-appearing on vent, trach, nonverbal. CVS: S1-S2 clear no murmur rubs or gallops Chest: Coarse rhonchi bilaterally Abdomen: soft nontender, PEG tube Extremities: Right AKA, anasarca, contractures upper extremity Neuro: does not follow commands    Get Medicines reviewed and adjusted: Please take all your medications with you for your next visit with your Primary MD  Please request your Primary MD to go over all hospital tests and procedure/radiological results at the follow up. Please ask your Primary MD to get all Hospital records sent to his/her office.  If you experience worsening of your admission symptoms, develop shortness of breath, life threatening emergency, suicidal or homicidal thoughts you must seek medical attention immediately by calling 911 or calling your MD immediately  if symptoms  less severe.  You must read complete instructions/literature along with all the possible adverse reactions/side effects for all the Medicines you take and that have been prescribed to you. Take any new Medicines after you have completely understood and accept all the possible adverse reactions/side effects.   Do not drive when taking pain medications.   Do not take more than prescribed Pain, Sleep and Anxiety Medications  Special Instructions: If you have smoked or chewed Tobacco  in the last 2 yrs please stop smoking, stop any regular Alcohol  and or any Recreational drug use.  Wear Seat belts while driving.  Please note  You were cared for by a hospitalist during your hospital stay. Once you are discharged, your primary care physician will handle any further medical issues. Please note that NO REFILLS for any discharge medications will be authorized once you are discharged, as it is imperative that you return to your primary care physician (or establish a relationship with a primary care physician if you do not have one) for your aftercare needs so that they can reassess your need for medications and monitor your lab  values.   The results of significant diagnostics from this hospitalization (including imaging, microbiology, ancillary and laboratory) are listed below for reference.      Procedures/Studies:  CT ABDOMEN WO CONTRAST  Result Date: 04/15/2020 CLINICAL DATA:  Possible G-tube malpositioning EXAM: CT ABDOMEN WITHOUT CONTRAST TECHNIQUE: Multidetector CT imaging of the abdomen was performed following the standard protocol without IV contrast. COMPARISON:  CT chest same day FINDINGS: Lower chest: Cardiomegaly. Coronary artery calcifications. Small bilateral pleural effusions with patchy bibasilar opacities. Hepatobiliary: Unremarkable unenhanced appearance of the liver. No focal liver lesion identified. Gallbladder is decompressed. No hyperdense gallstone. No biliary dilatation.  Pancreas: Unremarkable. No pancreatic ductal dilatation or surrounding inflammatory changes. Spleen: Grossly unremarkable. Adrenals/Urinary Tract: No definite adrenal nodule. Kidneys appear within normal limits. Excreted contrast within the bilateral renal collecting systems. No hydronephrosis. Stomach/Bowel: Percutaneous gastrostomy tube is appropriately positioned with balloon located in the anterior aspect of the distal gastric body. No evidence of outlet obstruction. Visualized bowel loops within the upper abdomen are within normal limits. No evidence of obstruction or inflammation. Vascular/Lymphatic: Extensive atherosclerosis throughout the aortoiliac axis. No upper abdominal lymphadenopathy. Other: No ascites or pneumoperitoneum. Musculoskeletal: No acute findings. IMPRESSION: 1. Percutaneous gastrostomy tube is appropriately positioned with balloon located intraluminally in the anterior aspect of the distal gastric body. No evidence of outlet obstruction. 2. No acute findings within the abdomen. 3. Small bilateral pleural effusions with patchy bibasilar opacities. Aortic Atherosclerosis (ICD10-I70.0). Electronically Signed   By: Davina Poke D.O.   On: 04/15/2020 16:16   DG Chest 2 View  Result Date: 04/19/2020 CLINICAL DATA:  Dyspnea, diabetes mellitus EXAM: CHEST - 2 VIEW COMPARISON:  Portable exam 1504 hours compared to 04/17/2020 FINDINGS: Tracheostomy tube unchanged. Enlargement of cardiac silhouette. Atherosclerotic calcifications aorta. Extensive BILATERAL pulmonary infiltrates, slightly increased. This could represent pulmonary edema or multifocal pneumonia. Small LEFT pleural effusion. No pneumothorax or acute osseous findings. IMPRESSION: BILATERAL pulmonary infiltrates question pulmonary edema versus multifocal pneumonia. Enlargement of cardiac silhouette with small LEFT pleural effusion. Electronically Signed   By: Lavonia Dana M.D.   On: 04/19/2020 16:40   DG Abd 1 View  Result Date:  05/06/2020 CLINICAL DATA:  NG tube placement. EXAM: ABDOMEN - 1 VIEW COMPARISON:  KUB and chest x-ray earlier today. FINDINGS: Interval placement of nasogastric tube with tip and side-port over the stomach in the left upper quadrant. Visualized bowel gas pattern is nonobstructive. Remainder of the exam is unchanged. IMPRESSION: Nasogastric tube with tip and side-port over the stomach in the left upper quadrant. Electronically Signed   By: Marin Olp M.D.   On: 05/06/2020 16:53   DG Abd 1 View  Result Date: 05/06/2020 CLINICAL DATA:  Ileus. EXAM: ABDOMEN - 1 VIEW COMPARISON:  November 06, 2010 FINDINGS: The lung bases were not imaged. Air-filled large and small bowel suggest ileus. Early or partial small bowel obstruction not completely excluded as there is no gas in the region of the rectum. No free air, portal venous gas, or pneumatosis. The right hip is abnormal and there is either bony erosion or a fracture. IMPRESSION: 1. Air-filled prominent loops of large and small bowel favored to represent ileus given history. Early or partial small bowel obstruction not completely excluded. Recommend close attention on follow-up. 2. Abnormal right hip with either a comminuted fracture or bony erosion, incompletely evaluated. Recommend clinical correlation and dedicated imaging. Electronically Signed   By: Dorise Bullion III M.D   On: 05/06/2020 15:07   CT Angio Chest PE W  and/or Wo Contrast  Result Date: 04/15/2020 CLINICAL DATA:  Tachycardia EXAM: CT ANGIOGRAPHY CHEST WITH CONTRAST TECHNIQUE: Multidetector CT imaging of the chest was performed using the standard protocol during bolus administration of intravenous contrast. Multiplanar CT image reconstructions and MIPs were obtained to evaluate the vascular anatomy. CONTRAST:  32m OMNIPAQUE IOHEXOL 350 MG/ML SOLN COMPARISON:  04/15/2020 FINDINGS: Cardiovascular: Satisfactory opacification of the pulmonary arteries to the segmental level. No evidence of pulmonary  embolism. Thoracic aorta is nonaneurysmal. Common origin of the brachiocephalic and left common carotid arteries. Tortuosity of the descending thoracic aorta. Atherosclerotic calcifications of the aorta and coronary arteries. Heart size is mildly enlarged. No pericardial effusion. Mediastinum/Nodes: Mildly prominent mediastinal lymph nodes including 12 mm precarinal node (series 5, image 37). No enlarged axillary or hilar lymph nodes identified. Tracheostomy tube is present. Trachea and esophagus within normal limits. No significant findings within the thyroid gland. Lungs/Pleura: Small bilateral pleural effusions. Loculated pleural fluid accumulation within the minor fissure on the right and within the oblique fissure on the left. Patchy bibasilar airspace opacities. Diffuse interlobular septal thickening with ground-glass attenuation throughout both lung fields. No pneumothorax. Upper Abdomen: Although only partially visualized at the edge of the field of view. Patient's percutaneous gastrostomy tube appears malpositioned with balloon appearing extraluminal positioned anterior to the left hepatic lobe (series 5, image 97). Musculoskeletal: No acute osseous abnormality.  No chest wall mass. Review of the MIP images confirms the above findings. IMPRESSION: 1. Negative for acute pulmonary embolism. 2. Findings suggestive of pulmonary edema with small bilateral pleural effusions. Loculated pleural fluid accumulation within the minor fissure on the right and within the oblique fissure on the left. 3. Patchy bibasilar airspace opacities may represent edema, atelectasis, or pneumonia. 4. Patient's percutaneous gastrostomy tube appears malpositioned although is incompletely visualized at the edge of the field of view. CT of the abdomen could be obtained to further evaluate. 5. Mildly prominent mediastinal lymph nodes, likely reactive. 6. Aortic and coronary artery atherosclerosis (ICD10-I70.0). Electronically Signed   By:  NDavina PokeD.O.   On: 04/15/2020 15:00   [place]NAL  Result Date: 04/18/2020 CLINICAL DATA:  Acute renal injury. EXAM: RENAL / URINARY TRACT ULTRASOUND COMPLETE COMPARISON:  CT abdomen pelvis 04/15/2020 FINDINGS: Right Kidney: Renal measurements: 10.5 x 3.9 x 4.5 cm = volume: 96 mL. Echogenicity within normal limits. No mass or hydronephrosis visualized. Left Kidney: Renal measurements: 9.3 x 4.8 x 4.1 cm = volume: 94 mL. Echogenicity within normal limits. No mass or hydronephrosis visualized. Bladder: Appears normal for degree of bladder distention. Other: Gallstones noted within the gallbladder lumen. At least trace simple free fluid noted within the pelvis. IMPRESSION: 1. Unremarkable renal ultrasound. 2. Cholelithiasis. 3. At least trace simple free fluid within the pelvis. Electronically Signed   By: MIven FinnM.D.   On: 04/18/2020 23:04   DG CHEST PORT 1 VIEW  Result Date: 05/06/2020 CLINICAL DATA:  Central line placement EXAM: PORTABLE CHEST 1 VIEW COMPARISON:  May 05, 2020 FINDINGS: There is a new right central line with the distal tip in the central SVC. No pneumothorax. Stable tracheostomy tube. The cardiomediastinal silhouette is stable. Possible mild pulmonary venous congestion. Patchy infiltrate in the right lung, worsened in the interval. IMPRESSION: 1. Support apparatus as above. 2. New patchy pulmonary infiltrate on the right worrisome for pneumonia or aspiration. Recommend clinical correlation. 3. Possible mild pulmonary venous congestion. Electronically Signed   By: DDorise BullionIII M.D   On: 05/06/2020 15:04   DG  CHEST PORT 1 VIEW  Result Date: 05/05/2020 CLINICAL DATA:  Respiratory failure, history of CVA EXAM: PORTABLE CHEST 1 VIEW COMPARISON:  04/21/2020 chest radiograph. FINDINGS: Tracheostomy tube tip overlies the tracheal air column at the thoracic inlet. Stable cardiomediastinal silhouette with top-normal heart size. No pneumothorax. Patient's right upper extremity  obscures the lateral right lung base. No significant pleural effusions. Similar hazy bibasilar lung opacities. No overt pulmonary edema. IMPRESSION: 1. Similar hazy bibasilar lung opacities, favor atelectasis, difficult to exclude a component of aspiration or pneumonia. 2. Tracheostomy tube appears well-positioned. Electronically Signed   By: Ilona Sorrel M.D.   On: 05/05/2020 08:09   DG Chest Port 1 View  Result Date: 04/21/2020 CLINICAL DATA:  Respiratory failure EXAM: PORTABLE CHEST 1 VIEW COMPARISON:  CT 04/15/2020, radiograph 04/20/2020 FINDINGS: Endotracheal tube tip terminates in the mid trachea, 5 cm from the carina. Telemetry leads and external support devices overlie the chest. The patient's right hand overlies the right lung base as well. Diffuse heterogeneous opacities again seen throughout both lungs, with some interval clearing particularly in the left mid lung. No pneumothorax. Suspect layering left effusion. No visible right effusion. Additional bandlike opacities favoring subsegmental atelectasis. Stable cardiomediastinal contours with a calcified aorta. No acute osseous or soft tissue abnormality. IMPRESSION: Endotracheal tube tip terminates in the mid trachea, 5 cm from the carina. Improving bilateral airspace opacities may reflect some resolving edema or infection. Layering left effusion. Electronically Signed   By: Lovena Le M.D.   On: 04/21/2020 05:34   DG Chest Port 1 View  Result Date: 04/20/2020 CLINICAL DATA:  Acute respiratory distress EXAM: PORTABLE CHEST 1 VIEW COMPARISON:  04/19/2020 FINDINGS: Single frontal view of the chest demonstrates stable tracheostomy tube. Cardiac silhouette remains enlarged. Widespread interstitial and ground-glass opacities are unchanged since the prior study. Trace bilateral pleural effusions are noted. No pneumothorax. IMPRESSION: 1. Stable widespread interstitial and ground-glass opacities consistent with edema or pneumonia. 2. Stable trace  bilateral pleural effusions. Electronically Signed   By: Randa Ngo M.D.   On: 04/20/2020 02:44   DG CHEST PORT 1 VIEW  Result Date: 04/17/2020 CLINICAL DATA:  Pneumonia. EXAM: PORTABLE CHEST 1 VIEW COMPARISON:  April 15, 2020. FINDINGS: Stable cardiomediastinal silhouette. Tracheostomy tube is in good position. No pneumothorax is noted. Small loculated left pleural effusion is noted. Stable bilateral lung opacities are noted concerning for pulmonary edema. Bony thorax is unremarkable. IMPRESSION: Stable bilateral lung opacities are noted concerning for pulmonary edema. Small loculated left pleural effusion is noted. Aortic Atherosclerosis (ICD10-I70.0). Electronically Signed   By: Marijo Conception M.D.   On: 04/17/2020 08:27   DG Chest Port 1 View  Result Date: 04/15/2020 CLINICAL DATA:  Tachycardia and wheezing EXAM: PORTABLE CHEST 1 VIEW COMPARISON:  November 06, 2010. FINDINGS: Tracheostomy catheter tip is 4.3 cm above the carina. No pneumothorax. There is apparent interstitial pulmonary edema with partially loculated small left pleural effusion. Ill-defined opacity noted in left base. There is cardiomegaly with pulmonary venous hypertension. No adenopathy. There is aortic atherosclerosis. No bone lesions. IMPRESSION: Tracheostomy as described without evident pneumothorax. Cardiomegaly with a degree of pulmonary vascular congestion. Evidence of interstitial pulmonary edema with small loculated pleural effusion. The appearance is suspicious for a degree of underlying congestive heart failure. Focal airspace opacity in the left base is concerning for focus of pneumonia, likely superimposed on a degree of interstitial pulmonary edema. Aortic Atherosclerosis (ICD10-I70.0). Electronically Signed   By: Lowella Grip III M.D.   On: 04/15/2020 13:55  DG Abd Portable 1V  Result Date: 05/09/2020 CLINICAL DATA:  Adynamic ileus. EXAM: PORTABLE ABDOMEN - 1 VIEW COMPARISON:  05/08/2020.  05/06/2020. FINDINGS:  NG tube noted with its tip in stable position in the stomach. No bowel distention. No free air. Mild residual contrast in the right colon. Aortoiliac and visceral atherosclerotic vascular calcification. Destructive changes again noted about the right hip. IMPRESSION: 1. NG tube noted with tip over the stomach in stable position. No bowel distention. 2.  Aortoiliac and visceral atherosclerotic vascular disease. 3.  Destructive changes again noted about the right hip. Electronically Signed   By: Marcello Moores  Register   On: 05/09/2020 05:06   DG Abd Portable 1V  Result Date: 05/08/2020 CLINICAL DATA:  Advanced nasogastric tube, evaluate position EXAM: PORTABLE ABDOMEN - 1 VIEW COMPARISON:  Abdominal radiograph obtained earlier today FINDINGS: The gastric tube has advanced. The tip now overlies the gastric antrum in good position. Right IJ non tunneled hemodialysis catheter with the tip overlying the mid SVC. Tracheostomy tube is present with the tip midline and at the level of the clavicles. Stable cardiomegaly. Atherosclerotic calcification present in the transverse aorta. Marked pulmonary vascular congestion with mild interstitial edema. Unremarkable bowel gas pattern. IMPRESSION: 1. Well-positioned NG tube with the tip overlying the gastric antrum. 2. Right IJ non tunneled hemodialysis catheter with the tip at the mid SVC. 3. Well-positioned tracheostomy tube. 4. Stable cardiomegaly with pulmonary vascular congestion and mild interstitial edema. 5. Aortic atherosclerosis. Electronically Signed   By: Jacqulynn Cadet M.D.   On: 05/08/2020 07:55   DG Abd Portable 1V  Result Date: 05/08/2020 CLINICAL DATA:  Ileus. EXAM: PORTABLE ABDOMEN - 1 VIEW COMPARISON:  05/06/2020. FINDINGS: NG tube tip noted in the upper most portion of the stomach. Advancement of approximately 10 cm should be considered. Multiple air-filled loops of small and large bowel noted suggesting adynamic ileus. No prominent bowel distention. No free  air identified. Hemidiaphragms incompletely imaged. Aortoiliac and visceral atherosclerotic vascular calcification. Radiopacity over the right lower quadrant may represent residual barium. Degenerative change lumbar spine and both hips. IMPRESSION: 1. NG tube tip noted in the upper most portion stomach. Advancement of approximately 10 cm should be considered. 2. Multiple air-filled loops of small and large bowel noted suggesting adynamic ileus. No prominent bowel distention. 3.  Aortoiliac and visceral atherosclerotic vascular disease. Electronically Signed   By: Marcello Moores  Register   On: 05/08/2020 05:42   ECHOCARDIOGRAM COMPLETE  Result Date: 04/16/2020    ECHOCARDIOGRAM REPORT   Patient Name:   GENESE HIBBETT Date of Exam: 04/16/2020 Medical Rec #:  [id number]   Height:       67.0 in Accession #:    [id number]  Weight:       173.3 lb Date of Birth:  08-13-1940   BSA:          1.903 m Patient Age:    16 years    BP:           122/77 mmHg Patient Gender: F           HR:           72 bpm. Exam Location:  Inpatient Procedure: 2D Echo, Cardiac Doppler and Color Doppler Indications:    I48.2 Chronic atrial fibrillation; I48.91* Unspeicified atrial                 fibrillation  History:        Patient has no prior history of Echocardiogram examinations.  Stroke; Risk Factors:Diabetes.  Sonographer:    Bernadene Person RDCS Referring Phys: [id number] [facility] A CHANDRASEKHAR IMPRESSIONS  1. Left ventricular ejection fraction, by estimation, is 55 to 60%. The left ventricle has normal function. The left ventricle has no regional wall motion abnormalities. Left ventricular diastolic function could not be evaluated.  2. Right ventricular systolic function is normal. The right ventricular size is normal. There is moderately elevated pulmonary artery systolic pressure.  3. Left atrial size was mildly dilated.  4. The mitral valve is grossly normal. Mild to moderate mitral valve regurgitation.  5. Tricuspid valve  regurgitation is moderate.  6. The aortic valve is calcified. There is mild calcification of the aortic valve. There is mild thickening of the aortic valve. Aortic valve regurgitation is mild.  7. Aortic dilatation noted. There is mild dilatation of the ascending aorta, measuring 39 mm. FINDINGS  Left Ventricle: Left ventricular ejection fraction, by estimation, is 55 to 60%. The left ventricle has normal function. The left ventricle has no regional wall motion abnormalities. The left ventricular internal cavity size was normal in size. There is  no left ventricular hypertrophy. Left ventricular diastolic function could not be evaluated due to atrial fibrillation. Left ventricular diastolic function could not be evaluated. Right Ventricle: The right ventricular size is normal. No increase in right ventricular wall thickness. Right ventricular systolic function is normal. There is moderately elevated pulmonary artery systolic pressure. The tricuspid regurgitant velocity is 3.60 m/s, and with an assumed right atrial pressure of 8 mmHg, the estimated right ventricular systolic pressure is 123456 mmHg. Left Atrium: Left atrial size was mildly dilated. Right Atrium: Right atrial size was normal in size. Pericardium: There is no evidence of pericardial effusion. Mitral Valve: The mitral valve is grossly normal. There is moderate thickening of the mitral valve leaflet(s). There is mild calcification of the mitral valve leaflet(s). Mild to moderate mitral annular calcification. Mild to moderate mitral valve regurgitation. Tricuspid Valve: The tricuspid valve is normal in structure. Tricuspid valve regurgitation is moderate. Aortic Valve: The aortic valve is calcified. There is mild calcification of the aortic valve. There is mild thickening of the aortic valve. Aortic valve regurgitation is mild. Aortic regurgitation PHT measures 455 msec. Pulmonic Valve: The pulmonic valve was normal in structure. Pulmonic valve  regurgitation is not visualized. Aorta: Aortic dilatation noted. There is mild dilatation of the ascending aorta, measuring 39 mm. IAS/Shunts: The atrial septum is grossly normal.  LEFT VENTRICLE PLAX 2D LVIDd:         5.10 cm LVIDs:         3.90 cm LV PW:         0.90 cm LV IVS:        1.00 cm LVOT diam:     2.00 cm LV SV:         49 LV SV Index:   26 LVOT Area:     3.14 cm  LV Volumes (MOD) LV vol d, MOD A2C: 96.4 ml LV vol d, MOD A4C: 84.5 ml LV vol s, MOD A2C: 39.2 ml LV vol s, MOD A4C: 40.4 ml LV SV MOD A2C:     57.2 ml LV SV MOD A4C:     84.5 ml LV SV MOD BP:      52.9 ml RIGHT VENTRICLE RV S prime:     6.94 cm/s TAPSE (M-mode): 1.1 cm LEFT ATRIUM             Index  RIGHT ATRIUM           Index LA diam:        3.70 cm 1.94 cm/m  RA Area:     17.00 cm LA Vol (A2C):   52.0 ml 27.33 ml/m RA Volume:   46.10 ml  24.23 ml/m LA Vol (A4C):   68.7 ml 36.11 ml/m LA Biplane Vol: 51.3 ml 26.96 ml/m  AORTIC VALVE LVOT Vmax:   93.38 cm/s LVOT Vmean:  63.350 cm/s LVOT VTI:    0.156 m AI PHT:      455 msec  AORTA Ao Root diam: 3.30 cm Ao Asc diam:  3.90 cm MR Peak grad:    89.1 mmHg   TRICUSPID VALVE MR Mean grad:    61.0 mmHg   TR Peak grad:   51.8 mmHg MR Vmax:         472.00 cm/s TR Vmax:        360.00 cm/s MR Vmean:        370.0 cm/s MR PISA:         0.57 cm    SHUNTS MR PISA Eff ROA: 5 mm       Systemic VTI:  0.16 m MR PISA Radius:  0.30 cm     Systemic Diam: 2.00 cm Mertie Moores MD Electronically signed by Mertie Moores MD Signature Date/Time: 04/16/2020/4:09:55 PM    Final        LAB RESULTS: Basic Metabolic Panel: Recent Labs  Lab 05/10/20 0307 05/11/20 0448 05/13/20 0527 05/14/20 0417  NA 138   < > 137 136  K 3.6   < > 3.6 3.8  CL 102   < > 100 100  CO2 24   < > 25 23  GLUCOSE 150*   < > 155* 155*  BUN 118*   < > 85* 98*  CREATININE 3.09*   < > 3.02* 3.36*  CALCIUM 8.7*   < > 8.3* 8.6*  MG 2.0  --   --   --   PHOS 5.3*   < > 5.2* 5.4*   < > = values in this interval not  displayed.   Liver Function Tests: Recent Labs  Lab 05/13/20 0527 05/14/20 0417  ALBUMIN 2.3* 2.5*   No results for input(s): LIPASE, AMYLASE in the last 168 hours. No results for input(s): AMMONIA in the last 168 hours. CBC: Recent Labs  Lab 05/13/20 0527 05/14/20 0417  WBC 8.0 7.5  NEUTROABS 6.0 5.3  HGB 7.8* 8.1*  HCT 25.8* 25.8*  MCV 98.1 95.2  PLT 117* 123*   Cardiac Enzymes: No results for input(s): CKTOTAL, CKMB, CKMBINDEX, TROPONINI in the last 168 hours. BNP: Invalid input(s): POCBNP CBG: Recent Labs  Lab 05/14/20 0733 05/14/20 1117  GLUCAP 148* 154*       Disposition and Follow-up: Discharge Instructions    Amb referral to [facility]   Complete by: As directed    Discharge wound care:   Complete by: As directed    Per nursing       DISPOSITION: LTAC   DISCHARGE FOLLOW-UP  Follow-up Information    Shackleford, Vicie Mutters, MD Follow up.   Specialty: Internal Medicine Why: as needed  Contact information: [address] [place] [address] [phone]        Werner Lean, MD .   Specialty: Cardiology Contact information: [address] [place]  [address] [phone]  Time coordinating discharge:  40 minutes  Signed:   Estill Cotta M.D. [facility] 05/14/2020, 2:36 PM

## 2020-05-15 DIAGNOSIS — J189 Pneumonia, unspecified organism: Secondary | ICD-10-CM

## 2020-05-15 DIAGNOSIS — N185 Chronic kidney disease, stage 5: Secondary | ICD-10-CM

## 2020-05-15 DIAGNOSIS — I480 Paroxysmal atrial fibrillation: Secondary | ICD-10-CM

## 2020-05-15 DIAGNOSIS — J9621 Acute and chronic respiratory failure with hypoxia: Secondary | ICD-10-CM

## 2020-05-15 DIAGNOSIS — J989 Respiratory disorder, unspecified: Secondary | ICD-10-CM

## 2020-05-15 NOTE — Progress Notes (Signed)
Palliative Care Progress Note  Reason for consult: Goals of care  I saw and examined Rebecca Harrell today.  She does not track or follow commands.  I met today with patient's daughter, [name].  We discussed continuation of current care and plan to transition back to [facility] if it can be arranged.  She understands that her mother's kidneys have failed and that this will continue to progress and she will likely die in the next couple of weeks.    Discussed case with care management who will work in conjunction with family and [facility] to determine discharge back to [facility] if possible.  Signed DNR placed on chart.  Discussed wth Dr. Tana Coast.  Total time: 20 minutes  Greater than 50%  of this time was spent counseling and coordinating care related to the above assessment and plan.  [name], MD [place] Team [phone]

## 2020-05-16 DIAGNOSIS — J9621 Acute and chronic respiratory failure with hypoxia: Secondary | ICD-10-CM

## 2020-05-16 DIAGNOSIS — N185 Chronic kidney disease, stage 5: Secondary | ICD-10-CM

## 2020-05-16 DIAGNOSIS — I480 Paroxysmal atrial fibrillation: Secondary | ICD-10-CM

## 2020-05-16 DIAGNOSIS — J989 Respiratory disorder, unspecified: Secondary | ICD-10-CM

## 2020-05-16 DIAGNOSIS — J189 Pneumonia, unspecified organism: Secondary | ICD-10-CM

## 2020-05-17 DIAGNOSIS — J989 Respiratory disorder, unspecified: Secondary | ICD-10-CM

## 2020-05-17 DIAGNOSIS — N185 Chronic kidney disease, stage 5: Secondary | ICD-10-CM

## 2020-05-17 DIAGNOSIS — J189 Pneumonia, unspecified organism: Secondary | ICD-10-CM

## 2020-05-17 DIAGNOSIS — I480 Paroxysmal atrial fibrillation: Secondary | ICD-10-CM

## 2020-05-17 DIAGNOSIS — J9621 Acute and chronic respiratory failure with hypoxia: Secondary | ICD-10-CM

## 2020-05-18 DIAGNOSIS — I480 Paroxysmal atrial fibrillation: Secondary | ICD-10-CM

## 2020-05-18 DIAGNOSIS — N185 Chronic kidney disease, stage 5: Secondary | ICD-10-CM

## 2020-05-18 DIAGNOSIS — J189 Pneumonia, unspecified organism: Secondary | ICD-10-CM

## 2020-05-18 DIAGNOSIS — J9621 Acute and chronic respiratory failure with hypoxia: Secondary | ICD-10-CM

## 2020-05-18 DIAGNOSIS — J989 Respiratory disorder, unspecified: Secondary | ICD-10-CM

## 2020-05-19 DIAGNOSIS — N185 Chronic kidney disease, stage 5: Secondary | ICD-10-CM

## 2020-05-19 DIAGNOSIS — J9621 Acute and chronic respiratory failure with hypoxia: Secondary | ICD-10-CM

## 2020-05-19 DIAGNOSIS — J189 Pneumonia, unspecified organism: Secondary | ICD-10-CM

## 2020-05-19 DIAGNOSIS — J989 Respiratory disorder, unspecified: Secondary | ICD-10-CM

## 2020-05-19 DIAGNOSIS — I480 Paroxysmal atrial fibrillation: Secondary | ICD-10-CM

## 2020-05-20 DIAGNOSIS — I480 Paroxysmal atrial fibrillation: Secondary | ICD-10-CM

## 2020-05-20 DIAGNOSIS — J989 Respiratory disorder, unspecified: Secondary | ICD-10-CM

## 2020-05-20 DIAGNOSIS — J189 Pneumonia, unspecified organism: Secondary | ICD-10-CM

## 2020-05-20 DIAGNOSIS — J9621 Acute and chronic respiratory failure with hypoxia: Secondary | ICD-10-CM

## 2020-05-20 DIAGNOSIS — N185 Chronic kidney disease, stage 5: Secondary | ICD-10-CM

## 2020-05-28 DIAGNOSIS — J989 Respiratory disorder, unspecified: Secondary | ICD-10-CM

## 2020-05-28 DIAGNOSIS — J9621 Acute and chronic respiratory failure with hypoxia: Secondary | ICD-10-CM

## 2020-05-28 DIAGNOSIS — J189 Pneumonia, unspecified organism: Secondary | ICD-10-CM

## 2020-05-28 DIAGNOSIS — I480 Paroxysmal atrial fibrillation: Secondary | ICD-10-CM

## 2020-05-28 DIAGNOSIS — N185 Chronic kidney disease, stage 5: Secondary | ICD-10-CM

## 2020-05-29 DIAGNOSIS — I480 Paroxysmal atrial fibrillation: Secondary | ICD-10-CM

## 2020-05-29 DIAGNOSIS — J9621 Acute and chronic respiratory failure with hypoxia: Secondary | ICD-10-CM

## 2020-05-29 DIAGNOSIS — J189 Pneumonia, unspecified organism: Secondary | ICD-10-CM

## 2020-05-29 DIAGNOSIS — J989 Respiratory disorder, unspecified: Secondary | ICD-10-CM

## 2020-05-29 DIAGNOSIS — N185 Chronic kidney disease, stage 5: Secondary | ICD-10-CM

## 2020-05-30 DIAGNOSIS — I480 Paroxysmal atrial fibrillation: Secondary | ICD-10-CM

## 2020-05-30 DIAGNOSIS — J989 Respiratory disorder, unspecified: Secondary | ICD-10-CM

## 2020-05-30 DIAGNOSIS — J9621 Acute and chronic respiratory failure with hypoxia: Secondary | ICD-10-CM

## 2020-05-30 DIAGNOSIS — J189 Pneumonia, unspecified organism: Secondary | ICD-10-CM

## 2020-05-30 DIAGNOSIS — N185 Chronic kidney disease, stage 5: Secondary | ICD-10-CM

## 2020-05-31 DIAGNOSIS — N185 Chronic kidney disease, stage 5: Secondary | ICD-10-CM

## 2020-05-31 DIAGNOSIS — J989 Respiratory disorder, unspecified: Secondary | ICD-10-CM

## 2020-05-31 DIAGNOSIS — J9621 Acute and chronic respiratory failure with hypoxia: Secondary | ICD-10-CM

## 2020-05-31 DIAGNOSIS — J189 Pneumonia, unspecified organism: Secondary | ICD-10-CM

## 2020-05-31 DIAGNOSIS — I480 Paroxysmal atrial fibrillation: Secondary | ICD-10-CM

## 2020-06-01 DIAGNOSIS — I480 Paroxysmal atrial fibrillation: Secondary | ICD-10-CM

## 2020-06-01 DIAGNOSIS — J189 Pneumonia, unspecified organism: Secondary | ICD-10-CM

## 2020-06-01 DIAGNOSIS — N185 Chronic kidney disease, stage 5: Secondary | ICD-10-CM

## 2020-06-01 DIAGNOSIS — J9621 Acute and chronic respiratory failure with hypoxia: Secondary | ICD-10-CM

## 2020-06-01 DIAGNOSIS — J989 Respiratory disorder, unspecified: Secondary | ICD-10-CM

## 2020-06-08 DIAGNOSIS — J189 Pneumonia, unspecified organism: Secondary | ICD-10-CM

## 2020-06-08 DIAGNOSIS — N185 Chronic kidney disease, stage 5: Secondary | ICD-10-CM

## 2020-06-08 DIAGNOSIS — J989 Respiratory disorder, unspecified: Secondary | ICD-10-CM

## 2020-06-08 DIAGNOSIS — J9621 Acute and chronic respiratory failure with hypoxia: Secondary | ICD-10-CM

## 2020-06-08 DIAGNOSIS — I480 Paroxysmal atrial fibrillation: Secondary | ICD-10-CM

## 2020-06-09 DIAGNOSIS — N185 Chronic kidney disease, stage 5: Secondary | ICD-10-CM

## 2020-06-09 DIAGNOSIS — J989 Respiratory disorder, unspecified: Secondary | ICD-10-CM

## 2020-06-09 DIAGNOSIS — I480 Paroxysmal atrial fibrillation: Secondary | ICD-10-CM

## 2020-06-09 DIAGNOSIS — J189 Pneumonia, unspecified organism: Secondary | ICD-10-CM

## 2020-06-09 DIAGNOSIS — J9621 Acute and chronic respiratory failure with hypoxia: Secondary | ICD-10-CM

## 2020-06-10 DIAGNOSIS — J989 Respiratory disorder, unspecified: Secondary | ICD-10-CM

## 2020-06-10 DIAGNOSIS — N185 Chronic kidney disease, stage 5: Secondary | ICD-10-CM

## 2020-06-10 DIAGNOSIS — J9621 Acute and chronic respiratory failure with hypoxia: Secondary | ICD-10-CM

## 2020-06-10 DIAGNOSIS — J189 Pneumonia, unspecified organism: Secondary | ICD-10-CM

## 2020-06-10 DIAGNOSIS — I480 Paroxysmal atrial fibrillation: Secondary | ICD-10-CM

## 2020-06-11 DIAGNOSIS — J189 Pneumonia, unspecified organism: Secondary | ICD-10-CM

## 2020-06-11 DIAGNOSIS — I480 Paroxysmal atrial fibrillation: Secondary | ICD-10-CM

## 2020-06-11 DIAGNOSIS — J989 Respiratory disorder, unspecified: Secondary | ICD-10-CM

## 2020-06-11 DIAGNOSIS — N185 Chronic kidney disease, stage 5: Secondary | ICD-10-CM

## 2020-06-11 DIAGNOSIS — J9621 Acute and chronic respiratory failure with hypoxia: Secondary | ICD-10-CM

## 2020-06-13 DIAGNOSIS — I480 Paroxysmal atrial fibrillation: Secondary | ICD-10-CM

## 2020-06-13 DIAGNOSIS — J189 Pneumonia, unspecified organism: Secondary | ICD-10-CM

## 2020-06-13 DIAGNOSIS — N185 Chronic kidney disease, stage 5: Secondary | ICD-10-CM

## 2020-06-13 DIAGNOSIS — J989 Respiratory disorder, unspecified: Secondary | ICD-10-CM

## 2020-06-13 DIAGNOSIS — J9621 Acute and chronic respiratory failure with hypoxia: Secondary | ICD-10-CM

## 2020-06-14 DIAGNOSIS — J9621 Acute and chronic respiratory failure with hypoxia: Secondary | ICD-10-CM

## 2020-06-14 DIAGNOSIS — J989 Respiratory disorder, unspecified: Secondary | ICD-10-CM

## 2020-06-14 DIAGNOSIS — I480 Paroxysmal atrial fibrillation: Secondary | ICD-10-CM

## 2020-06-14 DIAGNOSIS — N185 Chronic kidney disease, stage 5: Secondary | ICD-10-CM

## 2020-06-14 DIAGNOSIS — J189 Pneumonia, unspecified organism: Secondary | ICD-10-CM

## 2020-06-15 DIAGNOSIS — N185 Chronic kidney disease, stage 5: Secondary | ICD-10-CM

## 2020-06-15 DIAGNOSIS — J989 Respiratory disorder, unspecified: Secondary | ICD-10-CM

## 2020-06-15 DIAGNOSIS — J189 Pneumonia, unspecified organism: Secondary | ICD-10-CM

## 2020-06-15 DIAGNOSIS — J9621 Acute and chronic respiratory failure with hypoxia: Secondary | ICD-10-CM

## 2020-06-15 DIAGNOSIS — I480 Paroxysmal atrial fibrillation: Secondary | ICD-10-CM

## 2020-06-16 DIAGNOSIS — J189 Pneumonia, unspecified organism: Secondary | ICD-10-CM

## 2020-06-16 DIAGNOSIS — J989 Respiratory disorder, unspecified: Secondary | ICD-10-CM

## 2020-06-16 DIAGNOSIS — I480 Paroxysmal atrial fibrillation: Secondary | ICD-10-CM

## 2020-06-16 DIAGNOSIS — N185 Chronic kidney disease, stage 5: Secondary | ICD-10-CM

## 2020-06-16 DIAGNOSIS — J9621 Acute and chronic respiratory failure with hypoxia: Secondary | ICD-10-CM

## 2022-07-01 IMAGING — DX DG CHEST 1V PORT
1 series · 1 of 1 positions shown · non-contrast
Comparison: April 15, 2020.

CLINICAL DATA: Pneumonia.

EXAM:
PORTABLE CHEST 1 VIEW

[chest]
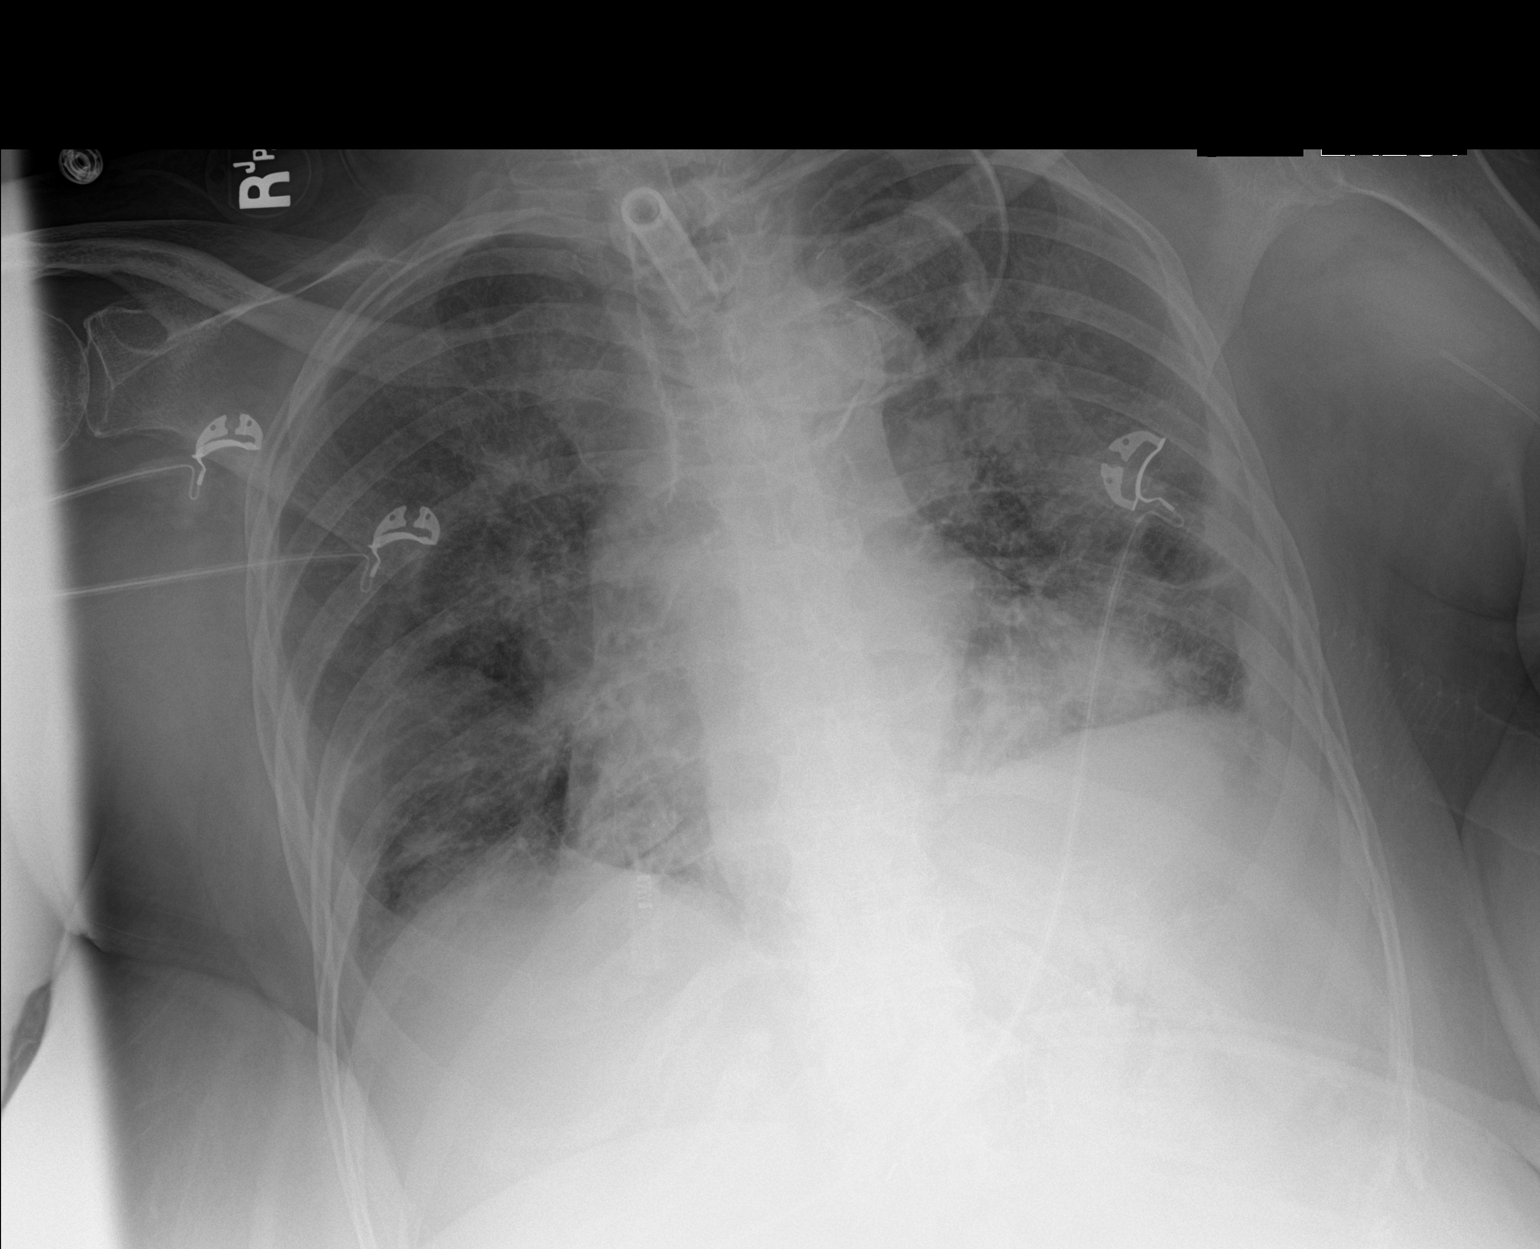

[1 of 1 positions shown; findings below may reference images not displayed]

FINDINGS: Stable cardiomediastinal silhouette. Tracheostomy tube is in good
position. No pneumothorax is noted. Small loculated left pleural
effusion is noted. Stable bilateral lung opacities are noted
concerning for pulmonary edema. Bony thorax is unremarkable.
IMPRESSION: Stable bilateral lung opacities are noted concerning for pulmonary
edema. Small loculated left pleural effusion is noted.

Aortic Atherosclerosis ([id number]).

## 2022-07-02 IMAGING — US US RENAL
1 series · 14 of 25 positions shown · non-contrast
Comparison: CT abdomen pelvis 04/15/2020

CLINICAL DATA: Acute renal injury.

EXAM:
RENAL / URINARY TRACT ULTRASOUND COMPLETE

[Series 1: us renal · 14 of 35 slices shown]
[im 1/35]
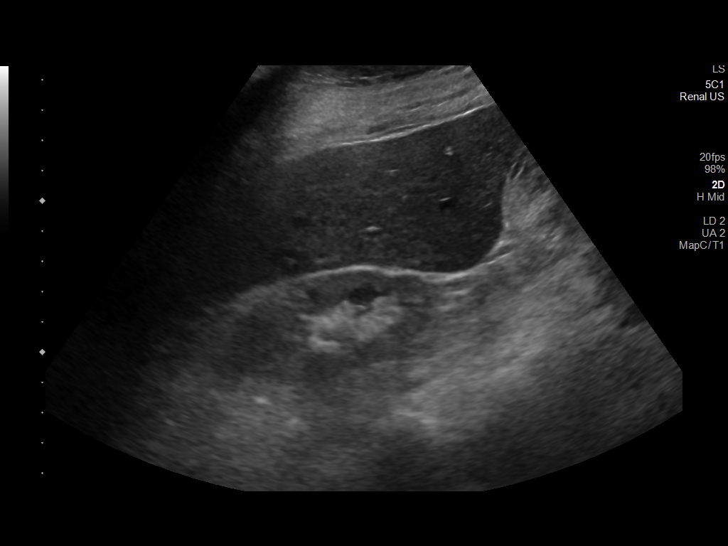
[im 3/35]
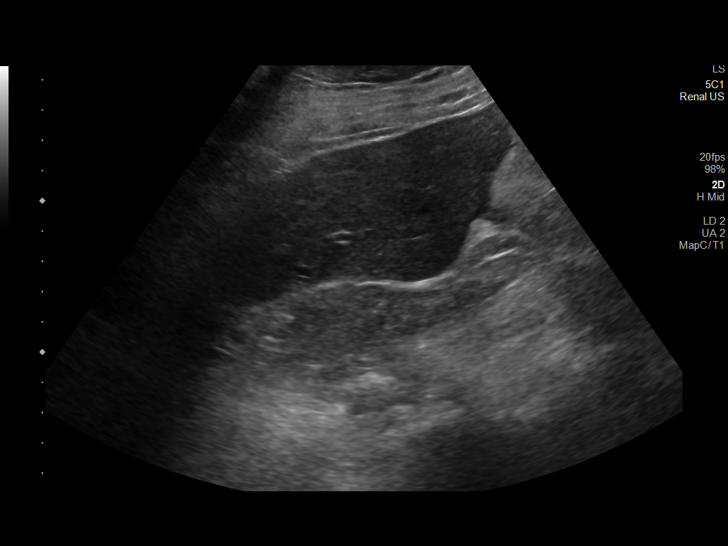
[im 6/35]
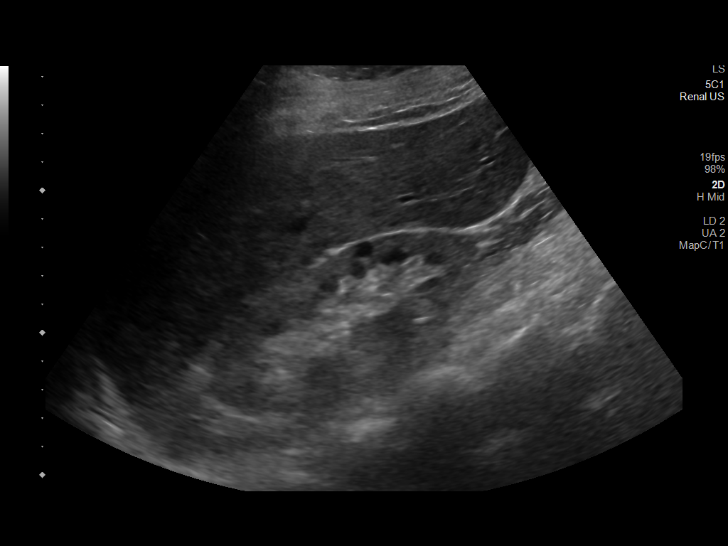
[im 9/35]
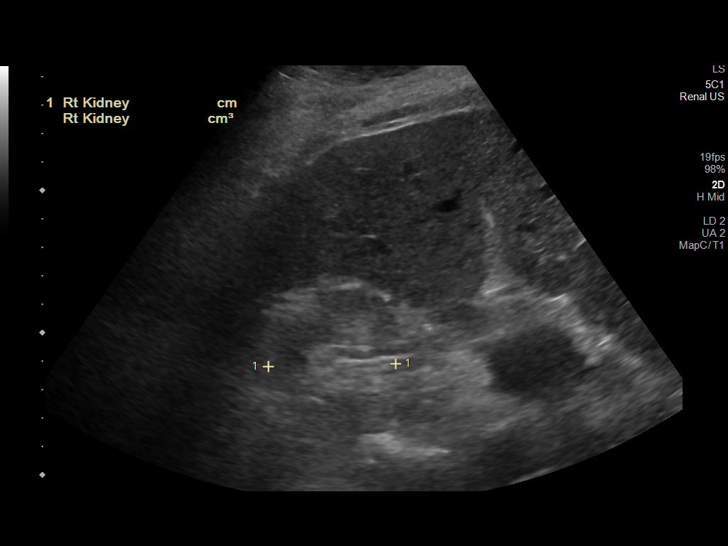
[im 12/35]
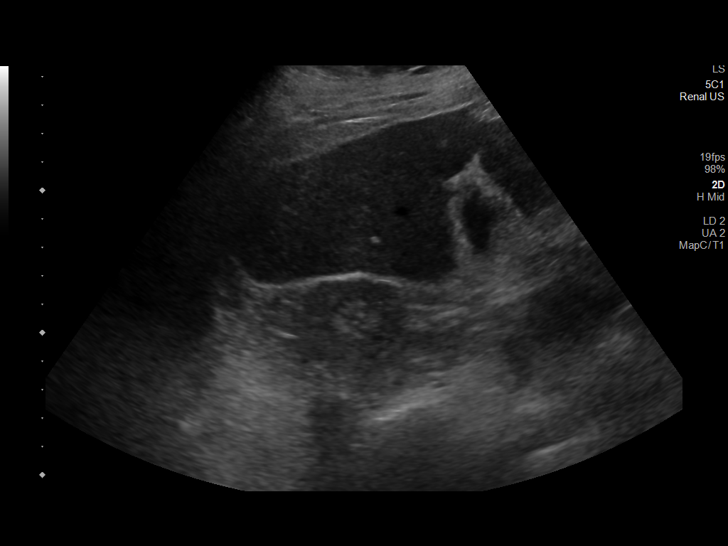
[im 13/35]
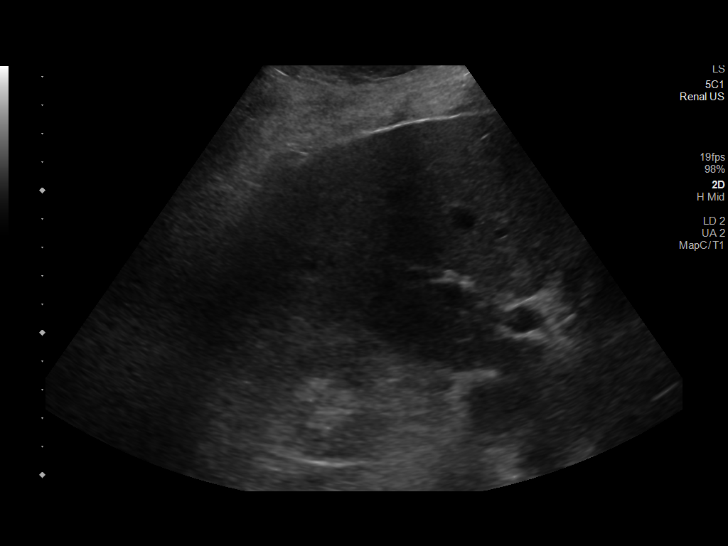
[im 16/35]
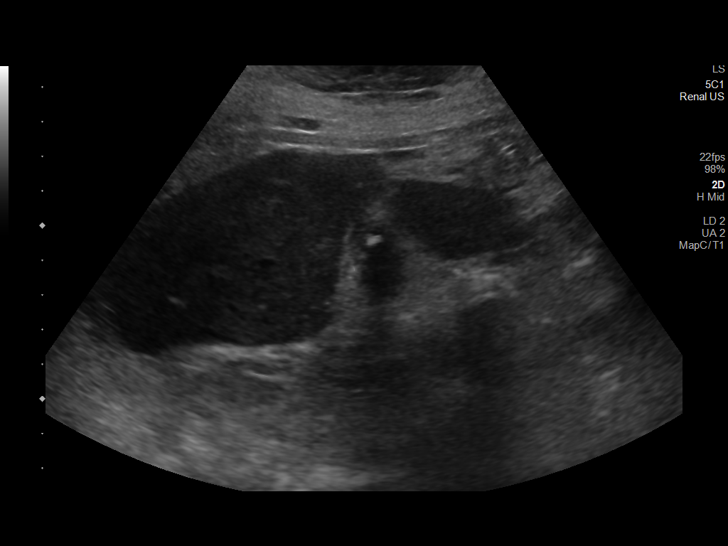
[im 19/35]
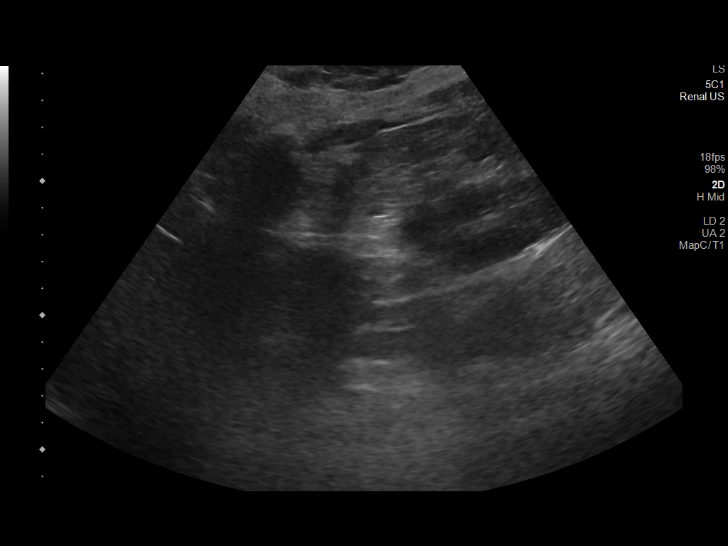
[im 22/35]
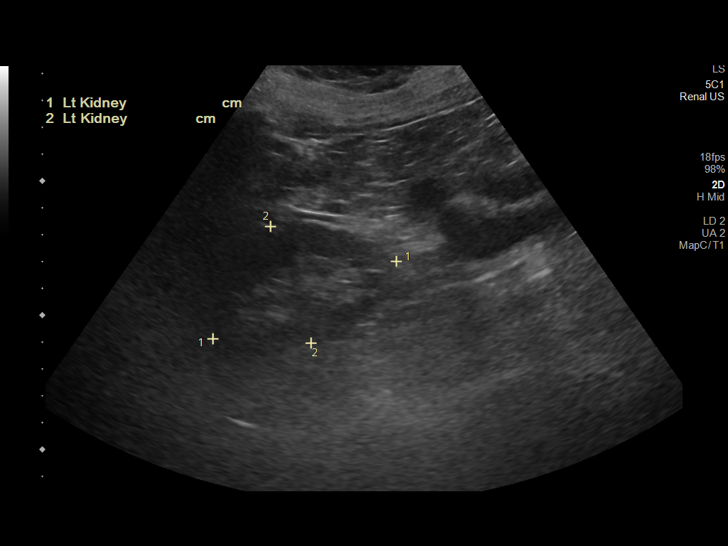
[im 23/35]
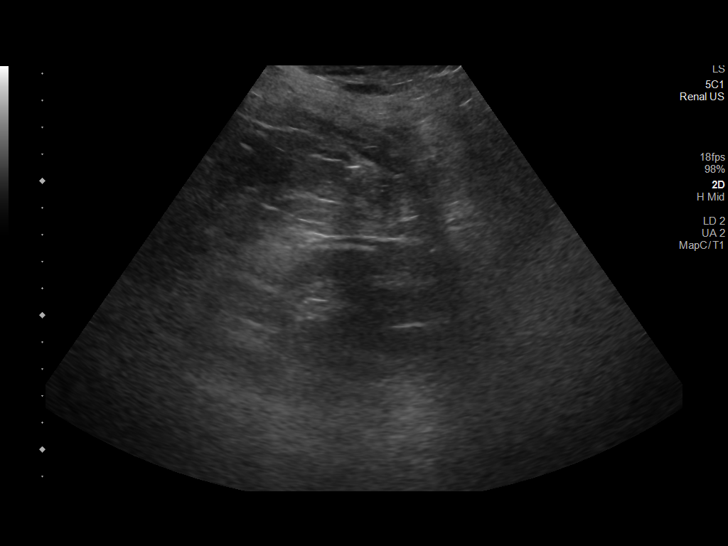
[im 26/35]
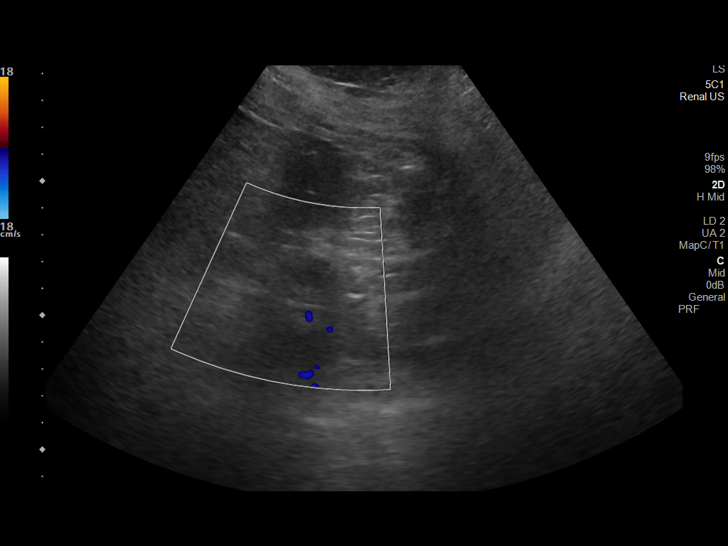
[im 29/35]
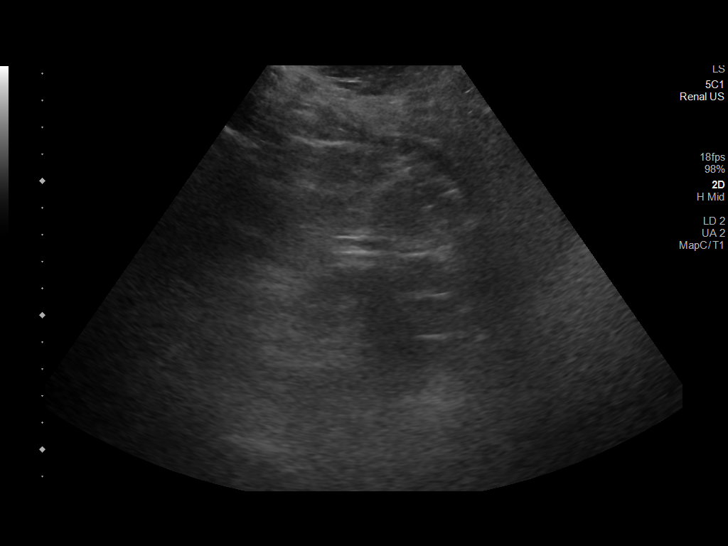
[im 32/35]
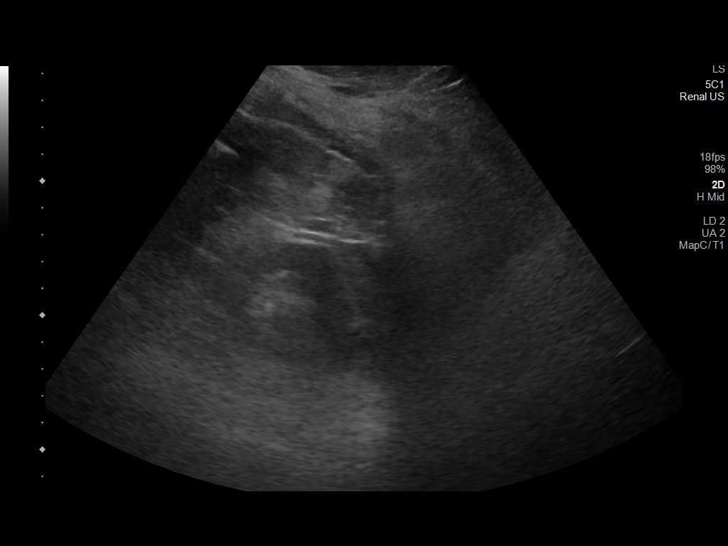
[im 35/35]
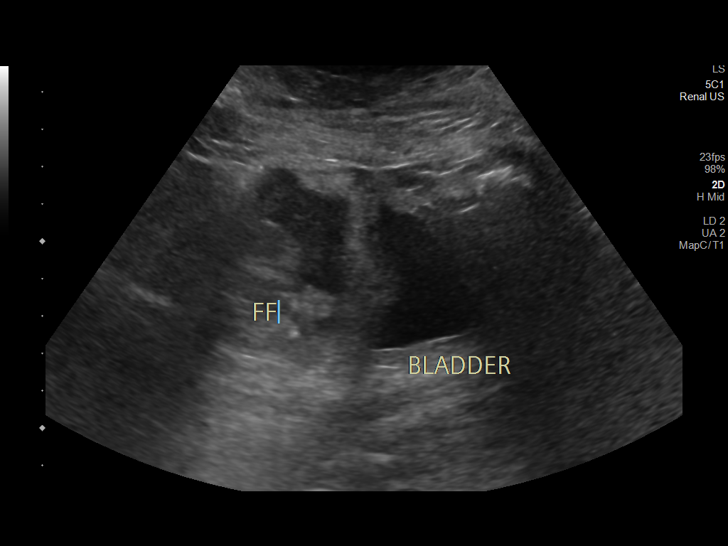

[14 of 25 positions shown; findings below may reference images not displayed]

FINDINGS: Right Kidney:

Renal measurements: 10.5 x 3.9 x 4.5 cm = volume: 96 mL.
Echogenicity within normal limits. No mass or hydronephrosis
visualized.

Left Kidney:

Renal measurements: 9.3 x 4.8 x 4.1 cm = volume: 94 mL. Echogenicity
within normal limits. No mass or hydronephrosis visualized.

Bladder:

Appears normal for degree of bladder distention.

Other: Gallstones noted within the gallbladder lumen. At least trace
simple free fluid noted within the pelvis.
IMPRESSION: 1. Unremarkable renal ultrasound.
2. Cholelithiasis.
3. At least trace simple free fluid within the pelvis.

## 2022-07-20 IMAGING — DX DG CHEST 1V PORT
1 series · 1 of 1 positions shown · non-contrast
Comparison: May 05, 2020

CLINICAL DATA: Central line placement

EXAM:
PORTABLE CHEST 1 VIEW

[chest ap]
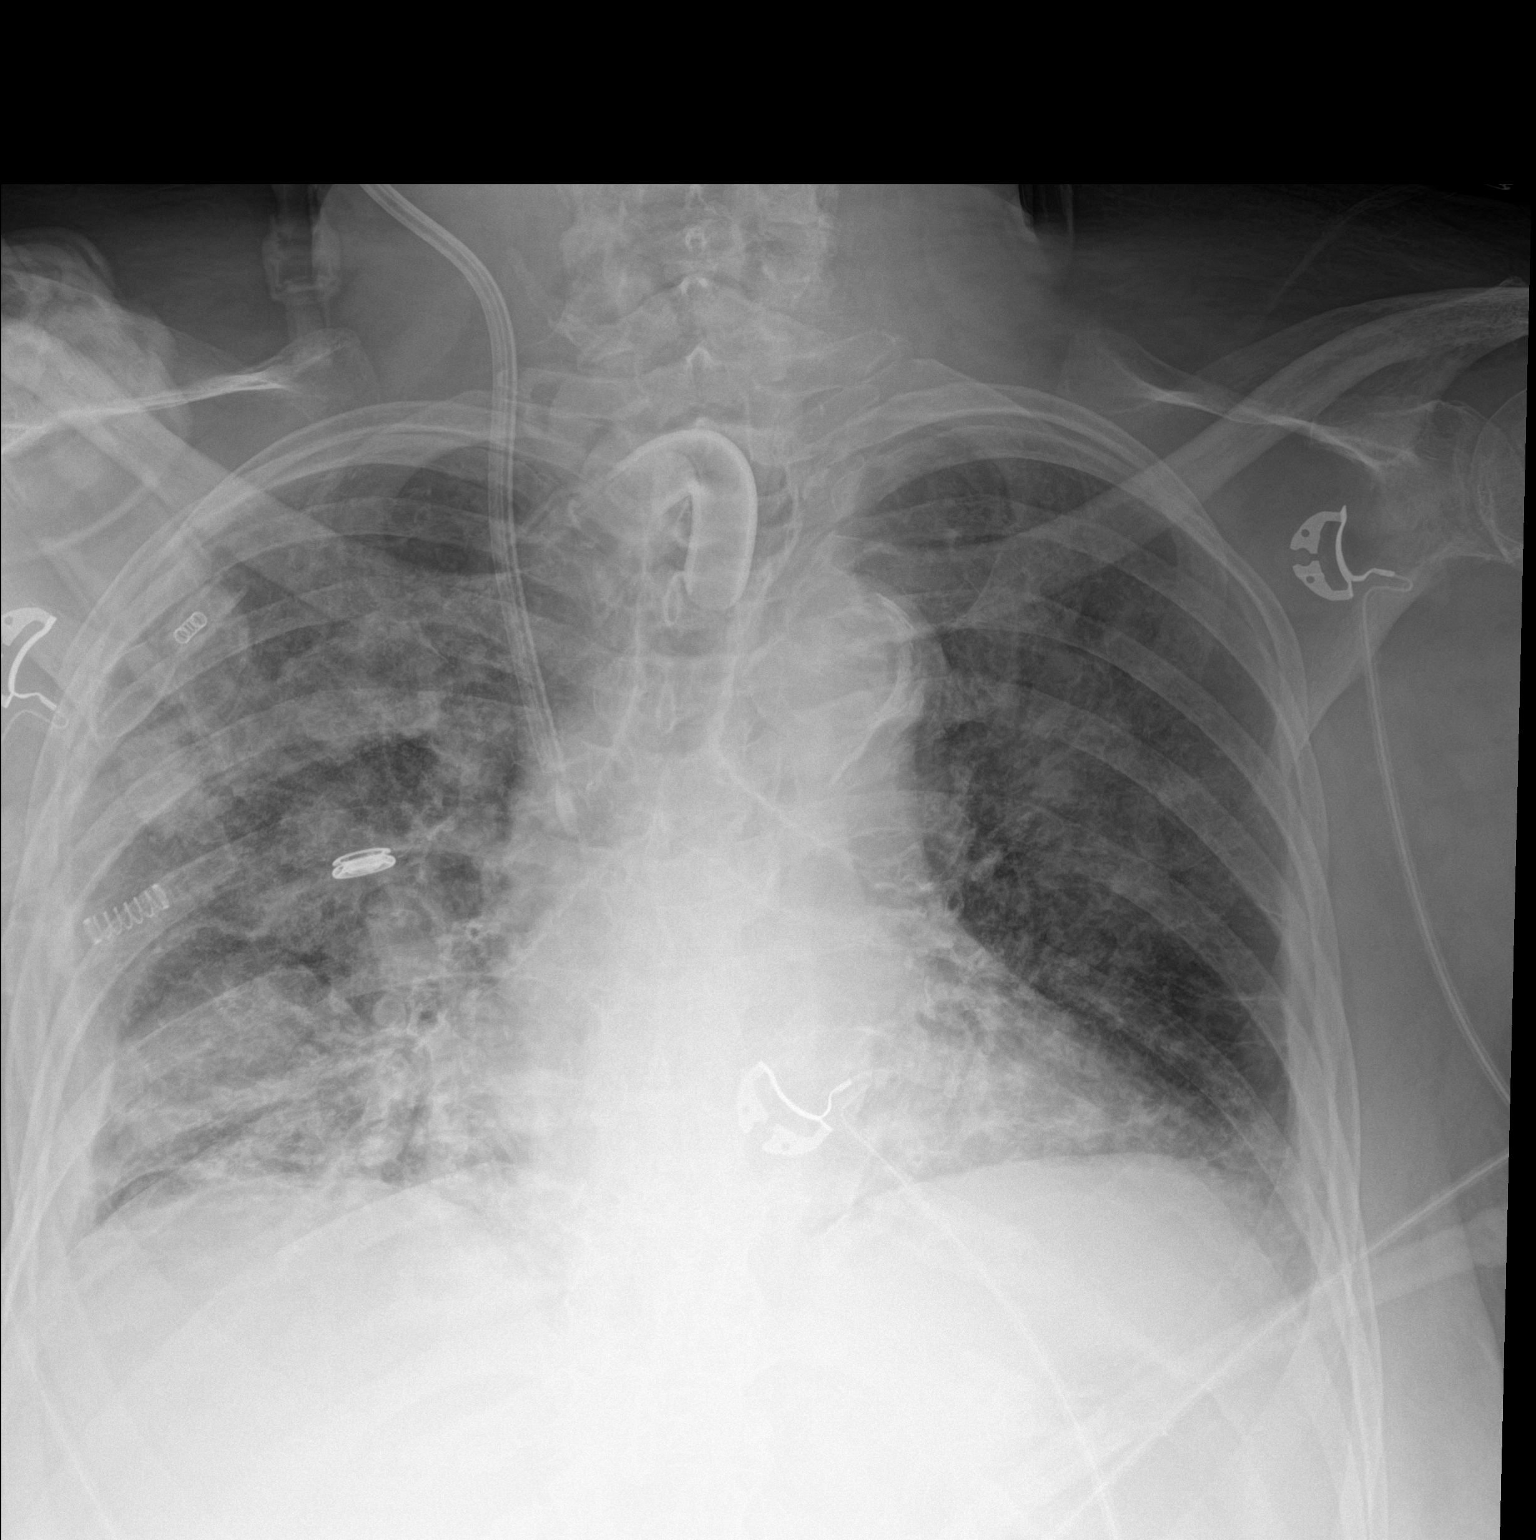

[1 of 1 positions shown; findings below may reference images not displayed]

FINDINGS: There is a new right central line with the distal tip in the central
SVC. No pneumothorax. Stable tracheostomy tube. The
cardiomediastinal silhouette is stable. Possible mild pulmonary
venous congestion. Patchy infiltrate in the right lung, worsened in
the interval.
IMPRESSION: 1. Support apparatus as above.
2. New patchy pulmonary infiltrate on the right worrisome for
pneumonia or aspiration. Recommend clinical correlation.
3. Possible mild pulmonary venous congestion.

## 2022-07-22 IMAGING — DX DG ABD PORTABLE 1V
1 series · 1 of 1 positions shown · non-contrast
Comparison: Abdominal radiograph obtained earlier today

CLINICAL DATA: Advanced nasogastric tube, evaluate position

EXAM:
PORTABLE ABDOMEN - 1 VIEW

[abdomen]
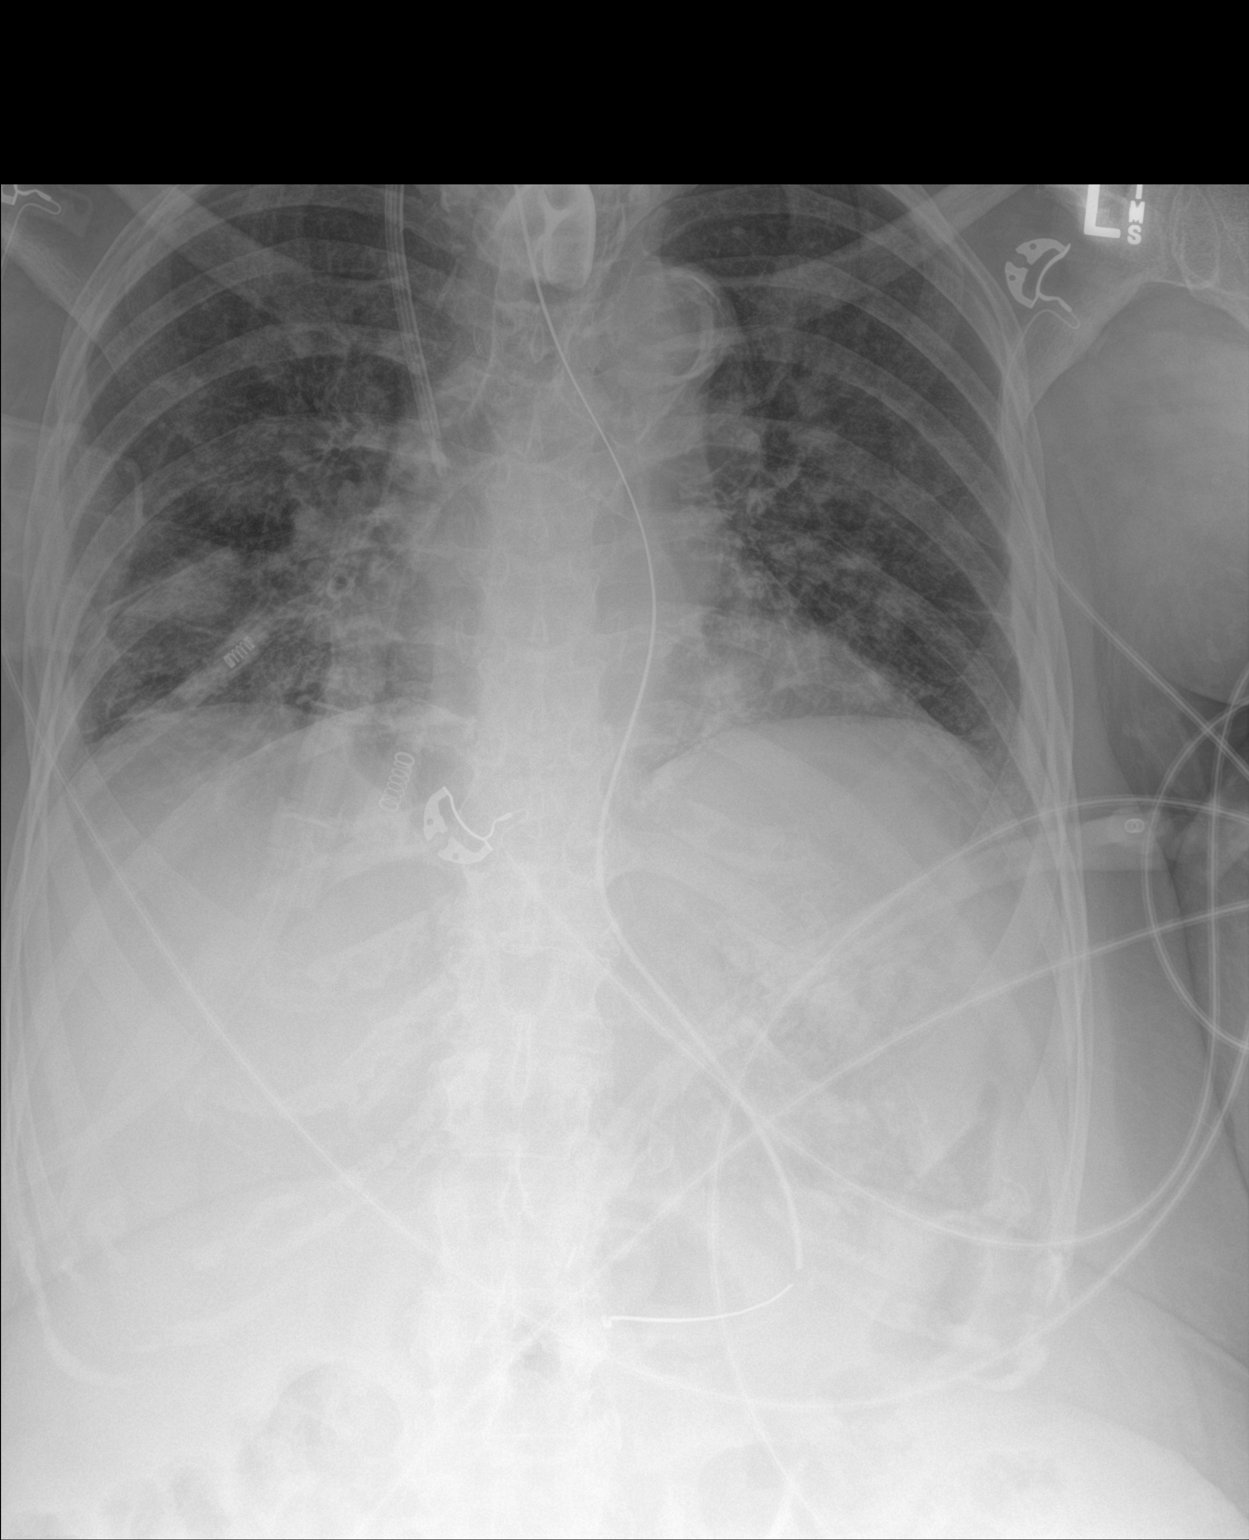

[1 of 1 positions shown; findings below may reference images not displayed]

FINDINGS: The gastric tube has advanced. The tip now overlies the gastric
antrum in good position. Right IJ non tunneled hemodialysis catheter
with the tip overlying the mid SVC. Tracheostomy tube is present
with the tip midline and at the level of the clavicles. Stable
cardiomegaly. Atherosclerotic calcification present in the
transverse aorta. Marked pulmonary vascular congestion with mild
interstitial edema. Unremarkable bowel gas pattern.
IMPRESSION: 1. Well-positioned NG tube with the tip overlying the gastric
antrum.
2. Right IJ non tunneled hemodialysis catheter with the tip at the
mid SVC.
3. Well-positioned tracheostomy tube.
4. Stable cardiomegaly with pulmonary vascular congestion and mild
interstitial edema.
5. Aortic atherosclerosis.
